# Patient Record
Sex: Female | Born: 1955 | Race: White | Hispanic: Yes | Marital: Single | Smoking: Former smoker
Health system: Southern US, Community
[De-identification: ages and names within clinical notes are randomized; demographics above are authoritative.]

## PROBLEM LIST (undated history)

## (undated) DIAGNOSIS — E1165 Type 2 diabetes mellitus with hyperglycemia: Secondary | ICD-10-CM

## (undated) DIAGNOSIS — Z8249 Family history of ischemic heart disease and other diseases of the circulatory system: Secondary | ICD-10-CM

## (undated) DIAGNOSIS — I82409 Acute embolism and thrombosis of unspecified deep veins of unspecified lower extremity: Secondary | ICD-10-CM

## (undated) DIAGNOSIS — M4800 Spinal stenosis, site unspecified: Secondary | ICD-10-CM

## (undated) DIAGNOSIS — R011 Cardiac murmur, unspecified: Secondary | ICD-10-CM

## (undated) DIAGNOSIS — M109 Gout, unspecified: Secondary | ICD-10-CM

## (undated) DIAGNOSIS — IMO0001 Reserved for inherently not codable concepts without codable children: Secondary | ICD-10-CM

## (undated) DIAGNOSIS — E119 Type 2 diabetes mellitus without complications: Secondary | ICD-10-CM

## (undated) DIAGNOSIS — E785 Hyperlipidemia, unspecified: Secondary | ICD-10-CM

## (undated) DIAGNOSIS — I10 Essential (primary) hypertension: Secondary | ICD-10-CM

## (undated) DIAGNOSIS — I80299 Phlebitis and thrombophlebitis of other deep vessels of unspecified lower extremity: Secondary | ICD-10-CM

## (undated) DIAGNOSIS — Z720 Tobacco use: Secondary | ICD-10-CM

## (undated) DIAGNOSIS — G4730 Sleep apnea, unspecified: Secondary | ICD-10-CM

## (undated) DIAGNOSIS — N390 Urinary tract infection, site not specified: Secondary | ICD-10-CM

## (undated) DIAGNOSIS — I2699 Other pulmonary embolism without acute cor pulmonale: Secondary | ICD-10-CM

## (undated) DIAGNOSIS — N200 Calculus of kidney: Secondary | ICD-10-CM

## (undated) HISTORY — PX: ABDOMINAL HYSTERECTOMY: SHX81

## (undated) HISTORY — DX: Calculus of kidney: N20.0

## (undated) HISTORY — DX: Reserved for inherently not codable concepts without codable children: IMO0001

## (undated) HISTORY — PX: APPENDECTOMY: SHX54

## (undated) HISTORY — DX: Phlebitis and thrombophlebitis of other deep vessels of unspecified lower extremity: I80.299

## (undated) HISTORY — DX: Gout, unspecified: M10.9

## (undated) HISTORY — DX: Morbid (severe) obesity due to excess calories: E66.01

## (undated) HISTORY — PX: INTESTINAL BYPASS: SHX1099

## (undated) HISTORY — DX: Type 2 diabetes mellitus without complications: E11.9

## (undated) HISTORY — DX: Type 2 diabetes mellitus with hyperglycemia: E11.65

## (undated) HISTORY — PX: STOMACH SURGERY: SHX791

## (undated) HISTORY — DX: Hyperlipidemia, unspecified: E78.5

## (undated) HISTORY — DX: Sleep apnea, unspecified: G47.30

## (undated) HISTORY — PX: COLECTOMY: SHX59

## (undated) HISTORY — DX: Spinal stenosis, site unspecified: M48.00

## (undated) HISTORY — DX: Urinary tract infection, site not specified: N39.0

## (undated) HISTORY — PX: CHOLECYSTECTOMY: SHX55

## (undated) HISTORY — DX: Essential (primary) hypertension: I10

## (undated) HISTORY — PX: ANKLE SURGERY: SHX546

## (undated) HISTORY — DX: Cardiac murmur, unspecified: R01.1

---

## 2009-07-25 ENCOUNTER — Ambulatory Visit: Payer: Self-pay | Admitting: Vascular Surgery

## 2009-07-25 ENCOUNTER — Encounter (INDEPENDENT_AMBULATORY_CARE_PROVIDER_SITE_OTHER): Payer: Self-pay | Admitting: Emergency Medicine

## 2009-07-25 ENCOUNTER — Observation Stay (HOSPITAL_COMMUNITY): Admission: EM | Admit: 2009-07-25 | Discharge: 2009-07-25 | Payer: Self-pay | Admitting: Emergency Medicine

## 2011-03-06 LAB — URINE MICROSCOPIC-ADD ON

## 2011-03-06 LAB — CBC
MCV: 80.7 fL (ref 78.0–100.0)
RBC: 4.58 MIL/uL (ref 3.87–5.11)
WBC: 5.3 10*3/uL (ref 4.0–10.5)

## 2011-03-06 LAB — URINALYSIS, ROUTINE W REFLEX MICROSCOPIC
Bilirubin Urine: NEGATIVE
Hgb urine dipstick: NEGATIVE
Specific Gravity, Urine: 1.017 (ref 1.005–1.030)
Urobilinogen, UA: 0.2 mg/dL (ref 0.0–1.0)

## 2011-03-06 LAB — PROTIME-INR
INR: 1.1 (ref 0.00–1.49)
Prothrombin Time: 13.6 seconds (ref 11.6–15.2)

## 2011-03-06 LAB — BASIC METABOLIC PANEL
Calcium: 8.8 mg/dL (ref 8.4–10.5)
Chloride: 104 mEq/L (ref 96–112)
Creatinine, Ser: 0.92 mg/dL (ref 0.4–1.2)
GFR calc Af Amer: >60 mL/min (ref >60)
GFR calc non Af Amer: >60 mL/min (ref >60)

## 2011-03-06 LAB — DIFFERENTIAL
Lymphocytes Relative: 24 % (ref 12–46)
Lymphs Abs: 1.3 10*3/uL (ref 0.7–4.0)
Monocytes Relative: 7 % (ref 3–12)
Neutro Abs: 3.4 10*3/uL (ref 1.7–7.7)
Neutrophils Relative %: 65 % (ref 43–77)

## 2011-03-06 LAB — GLUCOSE, CAPILLARY: Glucose-Capillary: 126 mg/dL — ABNORMAL HIGH (ref 70–99)

## 2012-09-15 LAB — HM MAMMOGRAPHY: HM Mammogram: NORMAL

## 2012-12-25 ENCOUNTER — Encounter: Payer: Self-pay | Admitting: Cardiovascular Disease

## 2012-12-25 ENCOUNTER — Ambulatory Visit (INDEPENDENT_AMBULATORY_CARE_PROVIDER_SITE_OTHER): Payer: PRIVATE HEALTH INSURANCE | Admitting: Cardiovascular Disease

## 2012-12-25 VITALS — BP 188/78 | HR 90 | Ht 61.5 in | Wt 315.8 lb

## 2012-12-25 DIAGNOSIS — E119 Type 2 diabetes mellitus without complications: Secondary | ICD-10-CM

## 2012-12-25 DIAGNOSIS — R0602 Shortness of breath: Secondary | ICD-10-CM | POA: Insufficient documentation

## 2012-12-25 DIAGNOSIS — R011 Cardiac murmur, unspecified: Secondary | ICD-10-CM | POA: Insufficient documentation

## 2012-12-25 DIAGNOSIS — I10 Essential (primary) hypertension: Secondary | ICD-10-CM

## 2012-12-25 DIAGNOSIS — Z0000 Encounter for general adult medical examination without abnormal findings: Secondary | ICD-10-CM

## 2012-12-25 NOTE — Assessment & Plan Note (Signed)
Given strong family history, diabetes, prior smoking history, we'll need to be aggressive with her lipids. Labs requested from [place]

## 2012-12-25 NOTE — Assessment & Plan Note (Signed)
Sugars are running high. We have encouraged continued exercise, careful diet management in an effort to lose weight.

## 2012-12-25 NOTE — Assessment & Plan Note (Signed)
Low-grade murmur. 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 Echocardiogram has been ordered given her shortness of breath.

## 2012-12-25 NOTE — Progress Notes (Signed)
Patient ID: Sonya Sanchez, female    DOB: 01-Dec-1955, 57 y.o.   MRN: [id number]  HPI Comments: A pleasant 57 year old woman who lives in [place]  in the winter and [place] the rest of the year, history of obesity, DVT/PE, diabetes, prior smoking history,  hypertension, strong family history of heart disease with parents dying in their 57s from heart attack by her report who presents to establish care. She was told in [place] by her primary care physician that she had a murmur and needed an echocardiogram.  She reports that she has shortness of breath with exertion. She is concerned about her primary care physician detailing a heart murmur. No recent chest pain, no lightheadedness or dizziness. Sugars have been running 140, sometimes up to 200. Difficult time losing weight. She does not check her blood pressure at home but reports that it is typically "okay". She does not do any regular exercise. She is excited as she has a wedding in the family next month. She has been unable to put on TED hose for chronic lower extremity edema.  She reports history of pulmonary embolism in 2006. At that time she had shortness of breath and it felt like "Bronchitis". Workup in [place] confirmed PE. She denies any long car trips or plane rides that could have caused a DVT. Primary care physician in [place] is monitoring her INR.   EKG shows normal sinus rhythm with rate 90 beats per minute, no significant ST or T wave changes  Ultrasound August 2010 for possible cellulitis showed no DVT   Outpatient Encounter Prescriptions as of 12/25/2012  Medication Sig Dispense Refill  . allopurinol (ZYLOPRIM) 300 MG tablet Take 300 mg by mouth daily.      . Cholecalciferol (VITAMIN D) 1000 UNITS capsule Take 2,000 Units by mouth daily.      Marland Kitchen esomeprazole (NEXIUM) 40 MG capsule Take 40 mg by mouth daily before breakfast.      . fenofibrate 54 MG tablet Take 54 mg by mouth daily.      . insulin aspart (NOVOLOG FLEXPEN)  100 UNIT/ML injection Inject 5 Units into the skin 3 (three) times daily before meals.      . insulin detemir (LEVEMIR) 100 UNIT/ML injection Inject 60 Units into the skin at bedtime.      . Liraglutide (VICTOZA) 18 MG/3ML SOLN Inject 1.8 mg into the skin daily.      . Multiple Vitamin (MULTIVITAMIN) tablet Take 1 tablet by mouth daily.      . sitaGLIPtan-metformin (JANUMET) 50-1000 MG per tablet Take 1 tablet by mouth daily.      . valsartan-hydrochlorothiazide (DIOVAN-HCT) 160-12.5 MG per tablet Take 1 tablet by mouth daily.      Marland Kitchen warfarin (COUMADIN) 10 MG tablet Take 10 mg by mouth daily.      Marland Kitchen warfarin (COUMADIN) 7.5 MG tablet Take 7.5 mg by mouth as directed.         Review of Systems  Constitutional: Negative.   HENT: Negative.   Eyes: Negative.   Respiratory: Positive for shortness of breath.   Cardiovascular: Negative.   Gastrointestinal: Negative.   Musculoskeletal: Negative.   Skin: Negative.   Neurological: Negative.   Hematological: Negative.   Psychiatric/Behavioral: Negative.   All other systems reviewed and are negative.    BP 188/78  Pulse 90  Ht 5' 1.5" (1.562 m)  Wt 315 lb 12 oz (143.223 kg)  BMI 58.69 kg/m2 Repeat blood pressure 158/80 on the right  arm  Physical Exam  Nursing note and vitals reviewed. Constitutional: She is oriented to person, place, and time. She appears well-developed and well-nourished.       Obese  HENT:  Head: Normocephalic.  Nose: Nose normal.  Mouth/Throat: Oropharynx is clear and moist.  Eyes: Conjunctivae normal are normal. Pupils are equal, round, and reactive to light.  Neck: Normal range of motion. Neck supple. No JVD present.  Cardiovascular: Normal rate, regular rhythm, S1 normal, S2 normal and intact distal pulses.  Exam reveals no gallop and no friction rub.   Murmur heard.  Crescendo systolic murmur is present with a grade of 1/6  Pulmonary/Chest: Effort normal and breath sounds normal. No respiratory distress. She  has no wheezes. She has no rales. She exhibits no tenderness.  Abdominal: Soft. Bowel sounds are normal. She exhibits no distension. There is no tenderness.  Musculoskeletal: Normal range of motion. She exhibits no edema and no tenderness.  Lymphadenopathy:    She has no cervical adenopathy.  Neurological: She is alert and oriented to person, place, and time. Coordination normal.  Skin: Skin is warm and dry. No rash noted. No erythema.       Skin changes of the lower extremities from chronic swelling/venous insufficiency.  Psychiatric: She has a normal mood and affect. Her behavior is normal. Judgment and thought content normal.         Assessment and Plan

## 2012-12-25 NOTE — Patient Instructions (Addendum)
You are doing well. No medication changes were made.  We will order an echo for murmur We have requested labs from your primary care doctor in [place] Please monitor your blood pressure at home, call the office for numbers greater than 140  Please call us if you have new issues that need to be addressed before your next appt.  Your physician wants you to follow-up in: 6 months.  You will receive a reminder letter in the mail two months in advance. If you don't receive a letter, please call our office to schedule the follow-up appointment.

## 2012-12-25 NOTE — Assessment & Plan Note (Signed)
Blood pressure is high today. We have recommended that she buy a blood pressure cuff, wrist cuff as her arms are large. Goal systolic pressure less than 135. She will call us with blood pressure numbers.

## 2012-12-25 NOTE — Assessment & Plan Note (Signed)
Likely secondary to obesity and deconditioning. Murmur on exam. Echocardiogram pending.

## 2013-01-17 ENCOUNTER — Ambulatory Visit: Payer: Self-pay | Admitting: Family Medicine

## 2013-01-23 ENCOUNTER — Other Ambulatory Visit (INDEPENDENT_AMBULATORY_CARE_PROVIDER_SITE_OTHER): Payer: PRIVATE HEALTH INSURANCE

## 2013-01-23 ENCOUNTER — Other Ambulatory Visit: Payer: Self-pay

## 2013-01-23 DIAGNOSIS — R011 Cardiac murmur, unspecified: Secondary | ICD-10-CM

## 2013-01-23 DIAGNOSIS — R0602 Shortness of breath: Secondary | ICD-10-CM

## 2013-01-31 ENCOUNTER — Telehealth: Payer: Self-pay | Admitting: *Deleted

## 2013-01-31 NOTE — Telephone Encounter (Signed)
Pt calling looking for echo results

## 2013-01-31 NOTE — Telephone Encounter (Signed)
Please review echo thanks

## 2013-02-05 ENCOUNTER — Other Ambulatory Visit: Payer: Self-pay

## 2013-02-05 ENCOUNTER — Telehealth: Payer: Self-pay

## 2013-02-05 DIAGNOSIS — I2699 Other pulmonary embolism without acute cor pulmonale: Secondary | ICD-10-CM

## 2013-02-05 DIAGNOSIS — I82409 Acute embolism and thrombosis of unspecified deep veins of unspecified lower extremity: Secondary | ICD-10-CM

## 2013-02-05 NOTE — Telephone Encounter (Signed)
Order faxed to [facility]

## 2013-02-05 NOTE — Telephone Encounter (Signed)
Pt would like Dr Mariah Milling to send lab orders for coumadin to [facility] on [address].

## 2013-02-06 NOTE — Telephone Encounter (Signed)
Essentially normal echo Normal LV function, ejection fraction greater than 55% Very mild aortic valve stenosis This does not need to be followed on a regular basis This would explain her murmur, not significant at this time

## 2013-02-07 NOTE — Telephone Encounter (Signed)
Pt informed Understanding verb 

## 2013-02-13 ENCOUNTER — Encounter: Payer: Self-pay | Admitting: Internal Medicine

## 2013-02-13 ENCOUNTER — Ambulatory Visit (INDEPENDENT_AMBULATORY_CARE_PROVIDER_SITE_OTHER): Payer: PRIVATE HEALTH INSURANCE | Admitting: Internal Medicine

## 2013-02-13 ENCOUNTER — Telehealth: Payer: Self-pay | Admitting: *Deleted

## 2013-02-13 ENCOUNTER — Ambulatory Visit: Payer: PRIVATE HEALTH INSURANCE | Admitting: Internal Medicine

## 2013-02-13 VITALS — BP 132/70 | HR 92 | Temp 98.9°F | Ht 59.75 in | Wt 321.0 lb

## 2013-02-13 DIAGNOSIS — E119 Type 2 diabetes mellitus without complications: Secondary | ICD-10-CM

## 2013-02-13 DIAGNOSIS — I10 Essential (primary) hypertension: Secondary | ICD-10-CM

## 2013-02-13 DIAGNOSIS — E785 Hyperlipidemia, unspecified: Secondary | ICD-10-CM

## 2013-02-13 LAB — COMPREHENSIVE METABOLIC PANEL
Alkaline Phosphatase: 43 U/L (ref 39–117)
BUN: 24 mg/dL — ABNORMAL HIGH (ref 6–23)
CO2: 26 mEq/L (ref 19–32)
GFR: 60.36 mL/min (ref >60.00)
Glucose, Bld: 163 mg/dL — ABNORMAL HIGH (ref 70–99)
Total Bilirubin: 0.5 mg/dL (ref 0.3–1.2)

## 2013-02-13 LAB — LIPID PANEL
Cholesterol: 223 mg/dL — ABNORMAL HIGH (ref 0–200)
HDL: 28.6 mg/dL — ABNORMAL LOW (ref >39.00)
VLDL: 35.6 mg/dL (ref 0.0–40.0)

## 2013-02-13 LAB — HEMOGLOBIN A1C: Hgb A1c MFr Bld: 9.8 % — ABNORMAL HIGH (ref 4.6–6.5)

## 2013-02-13 MED ORDER — EZETIMIBE-SIMVASTATIN 10-20 MG PO TABS: 1.0000 | ORAL_TABLET | Freq: Every day | ORAL | Status: DC

## 2013-02-13 MED ORDER — INSULIN ASPART 100 UNIT/ML ~~LOC~~ SOLN: 5.0000 [IU] | Freq: Three times a day (TID) | SUBCUTANEOUS | Status: DC

## 2013-02-13 MED ORDER — INSULIN DETEMIR 100 UNIT/ML ~~LOC~~ SOLN: SUBCUTANEOUS | Status: DC

## 2013-02-13 NOTE — Assessment & Plan Note (Signed)
BP Readings from Last 3 Encounters:  02/13/13 132/70  12/25/12 188/78   BP improved today compared to check with cardiology. Will continue current medications. Renal function normal with labs today.

## 2013-02-13 NOTE — Assessment & Plan Note (Signed)
Body mass index is 63.19 kg/(m^2).  Discussed keeping a food diary, setting goal of calories <1200 per day, and increasing physical activity.

## 2013-02-13 NOTE — Assessment & Plan Note (Signed)
LDL above goal today. Consider increasing Vytorin to 10-40 at next visit.

## 2013-02-13 NOTE — Telephone Encounter (Signed)
Called [phone] for the Novolog, form is being faxed over now

## 2013-02-13 NOTE — Assessment & Plan Note (Signed)
Lab Results  Component Value Date   HGBA1C 9.8* 02/13/2013   A1c elevated today. Recommended increase in Levemir to 70 units in the morning and 30units at night. Will continue Novolog at current dose. Pt will follow up for repeat A1c in 04/2013 with her physician in [place].

## 2013-02-13 NOTE — Patient Instructions (Signed)
[facility] [place] Times Jan 2012

## 2013-02-13 NOTE — Progress Notes (Signed)
Subjective:    Patient ID: Sonya Sanchez, female    DOB: 07-30-1956, 57 y.o.   MRN: [id number]  HPI 57YO female with h/o DVT/PE on chronic anticoagulation, morbid obesity, DM, HTN presents to establish care.  DM - compliant with meds. Did not bring record of BG, but reports BG well controlled. Tries to limit intake of sweets. Does not follow specific diet.  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 Unsure if any hypercoag workup completed in past. Recent INR low 1.6, repeat level pending. No recent bleeding or bruising.  Obesity - Lifelong. Has tried numerous diets. Success at losing weight but not maintaining weight loss. Exercise limited by chronic pain in ankles and knees.  Outpatient Encounter Prescriptions as of 02/13/2013  Medication Sig Dispense Refill  . allopurinol (ZYLOPRIM) 300 MG tablet Take 300 mg by mouth daily.      . Cholecalciferol (VITAMIN D) 1000 UNITS capsule Take 2,000 Units by mouth daily.      Marland Kitchen esomeprazole (NEXIUM) 40 MG capsule Take 40 mg by mouth daily before breakfast.      . fenofibrate 54 MG tablet Take 54 mg by mouth daily.      . insulin aspart (NOVOLOG FLEXPEN) 100 UNIT/ML injection Inject 5 Units into the skin 3 (three) times daily before meals.  1 vial  6  . insulin detemir (LEVEMIR) 100 UNIT/ML injection 60units qam and 25units at bedtime  10 mL  6  . Liraglutide (VICTOZA) 18 MG/3ML SOLN Inject 1.8 mg into the skin daily.      . Multiple Vitamin (MULTIVITAMIN) tablet Take 1 tablet by mouth daily.      . sitaGLIPtan-metformin (JANUMET) 50-1000 MG per tablet Take 1 tablet by mouth daily.      . valsartan-hydrochlorothiazide (DIOVAN-HCT) 160-12.5 MG per tablet Take 1 tablet by mouth daily.      Marland Kitchen warfarin (COUMADIN) 10 MG tablet Take 10 mg by mouth daily.      Marland Kitchen warfarin (COUMADIN) 7.5 MG tablet Take 7.5 mg by mouth as directed.      . [DISCONTINUED] insulin aspart (NOVOLOG FLEXPEN) 100 UNIT/ML injection Inject 5 Units into the skin 3  (three) times daily before meals.      . [DISCONTINUED] insulin detemir (LEVEMIR) 100 UNIT/ML injection Inject 60 Units into the skin at bedtime.      Marland Kitchen ezetimibe-simvastatin (VYTORIN) 10-20 MG per tablet Take 1 tablet by mouth at bedtime.  30 tablet  6   No facility-administered encounter medications on file as of 02/13/2013.   BP 132/70  Pulse 92  Temp(Src) 98.9 F (37.2 C) (Oral)  Ht 4' 11.75" (1.518 m)  Wt 321 lb (145.605 kg)  BMI 63.19 kg/m2  SpO2 95%  Review of Systems  Constitutional: Negative for fever, chills, appetite change, fatigue and unexpected weight change.  HENT: Negative for ear pain, congestion, sore throat, trouble swallowing, neck pain, voice change and sinus pressure.   Eyes: Negative for visual disturbance.  Respiratory: Positive for shortness of breath (with exertion). Negative for cough, wheezing and stridor.   Cardiovascular: Negative for chest pain, palpitations and leg swelling.  Gastrointestinal: Negative for nausea, vomiting, abdominal pain, diarrhea, constipation, blood in stool, abdominal distention and anal bleeding.  Genitourinary: Negative for dysuria and flank pain.  Musculoskeletal: Positive for arthralgias. Negative for myalgias and gait problem.  Skin: Negative for color change and rash.  Neurological: Negative for dizziness and headaches.  Hematological: Negative for adenopathy. Does not bruise/bleed easily.  Psychiatric/Behavioral: Negative for suicidal  ideas, sleep disturbance and dysphoric mood. The patient is not nervous/anxious.        Objective:   Physical Exam  Constitutional: She is oriented to person, place, and time. She appears well-developed and well-nourished. No distress.  HENT:  Head: Normocephalic and atraumatic.  Right Ear: External ear normal.  Left Ear: External ear normal.  Nose: Nose normal.  Mouth/Throat: Oropharynx is clear and moist. No oropharyngeal exudate.  Eyes: Conjunctivae are normal. Pupils are equal, round,  and reactive to light. Right eye exhibits no discharge. Left eye exhibits no discharge. No scleral icterus.  Neck: Normal range of motion. Neck supple. No tracheal deviation present. No thyromegaly present.  Cardiovascular: Normal rate, regular rhythm and intact distal pulses.  Exam reveals no gallop and no friction rub.   Murmur heard. Pulmonary/Chest: Effort normal and breath sounds normal. No respiratory distress. She has no wheezes. She has no rales. She exhibits no tenderness.  Musculoskeletal: Normal range of motion. She exhibits no edema and no tenderness.  Lymphadenopathy:    She has no cervical adenopathy.  Neurological: She is alert and oriented to person, place, and time. No cranial nerve deficit. She exhibits normal muscle tone. Coordination normal.  Skin: Skin is warm and dry. No rash noted. She is not diaphoretic. No erythema. No pallor.  Psychiatric: She has a normal mood and affect. Her behavior is normal. Judgment and thought content normal.          Assessment & Plan:

## 2013-02-14 ENCOUNTER — Telehealth: Payer: Self-pay

## 2013-02-14 NOTE — Telephone Encounter (Signed)
Pt confirms she had labs drawn at [facility] 3/14 I called [facility] and they confirmed pt had INR checked 3/14  I then called [facility] results line and wa stold again they have no record of this pt by name, DOB, account # or SS#.  I will call pt back to discuss

## 2013-02-14 NOTE — Telephone Encounter (Signed)
LMTCB re: INR that was supposed to be checked at [facility] I called [facility] who has no record of pt

## 2013-02-16 NOTE — Telephone Encounter (Signed)
I was able to speak with rep at [facility] again They say they have pt's results and are faxing now

## 2013-02-16 NOTE — Telephone Encounter (Signed)
I called pt to tell her INR=2.4 on 3/14 She lives in [place] but is here until the end of April [place] MD continues to manage  She will stay on same dose and check again in 1 month here in our office I will also fax lab results to Dr. Lendell Caprice in [place]

## 2013-02-22 ENCOUNTER — Telehealth: Payer: Self-pay | Admitting: *Deleted

## 2013-02-22 NOTE — Telephone Encounter (Signed)
Patient would like for you to call her. She is having problems with having her script for Novolog filled

## 2013-02-22 NOTE — Telephone Encounter (Signed)
Explained to patient that we have not received anything in reference to this, if she could call her pharmacy and have them fax [place] the information.

## 2013-02-22 NOTE — Telephone Encounter (Signed)
Was calling to see if someone was able to get authorization for her Novolog.

## 2013-02-28 MED ORDER — INSULIN LISPRO 100 UNIT/ML ~~LOC~~ SOLN: 5.0000 [IU] | Freq: Three times a day (TID) | SUBCUTANEOUS | Status: DC

## 2013-02-28 NOTE — Telephone Encounter (Signed)
Patient returned call and stated she just got in touch with her insurance company today. They do not cover Novolog, they would like her to switch to Humalog. Please fax prescription to [facility] in [place].

## 2013-03-02 ENCOUNTER — Telehealth: Payer: Self-pay | Admitting: *Deleted

## 2013-03-02 MED ORDER — INSULIN LISPRO 100 UNIT/ML ~~LOC~~ SOLN: 5.0000 [IU] | Freq: Three times a day (TID) | SUBCUTANEOUS | Status: DC

## 2013-03-02 NOTE — Telephone Encounter (Signed)
Fine to change to Humalog quick pen, same directions

## 2013-03-02 NOTE — Telephone Encounter (Signed)
Called because the vial of Humalog was sent to the pharmacy and she needs the [facility] pen. Need this to be sent to [facility] on [address].

## 2013-03-02 NOTE — Telephone Encounter (Signed)
Prescription updated and sent to pharmacy.

## 2013-03-19 ENCOUNTER — Telehealth: Payer: Self-pay

## 2013-03-19 NOTE — Telephone Encounter (Signed)
Has appt with coumadin clinic 4/23

## 2013-03-19 NOTE — Telephone Encounter (Signed)
INR

## 2013-03-20 ENCOUNTER — Telehealth: Payer: Self-pay | Admitting: Internal Medicine

## 2013-03-20 NOTE — Telephone Encounter (Signed)
Patient was told that she needs to come to be seen by Dr. Dan Humphreys before getting a prescription for the zpak.Patient stated she would go to her cardiologist tomorrow.

## 2013-03-20 NOTE — Telephone Encounter (Signed)
Patient Information:  Caller Name: [place]  Phone: [phone]  Patient: Sonya Sanchez  Gender: Female  DOB: 1956/04/28  Age: 57 Years  PCP: Ronna Polio (Adults only)  Office Follow Up:  Does the office need to follow up with this patient?: Yes  Instructions For The Office: Wondering if Sanchez Zpak could be called in today since she already has an appt 4/23 with her cardiologist   Symptoms  Reason For Call & Symptoms: c/o feeling hot and cold, but denies fever; congested in nose and chest; coughing up some yellowish phlegm; c/o HA from coughing; ears congested  Reviewed Health History In EMR: Yes  Reviewed Medications In EMR: Yes  Reviewed Allergies In EMR: Yes  Reviewed Surgeries / Procedures: Yes  Date of Onset of Symptoms: 03/19/2013  Treatments Tried: Tylenol Cold & Allergy  Treatments Tried Worked: No  Guideline(s) Used:  Colds  Cough  Disposition Per Guideline:   See Today in Office  Reason For Disposition Reached:   Sinus pain persists after using nasal washes and pain medicine > 24 hours  Advice Given:  N/A  RN Overrode Recommendation:  Patient Already Has Appt, Document Patient  has appt with cardiologist 03/21/13

## 2013-03-20 NOTE — Telephone Encounter (Deleted)
Please Advise

## 2013-03-21 ENCOUNTER — Ambulatory Visit (INDEPENDENT_AMBULATORY_CARE_PROVIDER_SITE_OTHER): Payer: PRIVATE HEALTH INSURANCE

## 2013-03-21 DIAGNOSIS — Z7189 Other specified counseling: Secondary | ICD-10-CM | POA: Insufficient documentation

## 2013-03-21 DIAGNOSIS — Z7901 Long term (current) use of anticoagulants: Secondary | ICD-10-CM

## 2013-03-21 DIAGNOSIS — I2699 Other pulmonary embolism without acute cor pulmonale: Secondary | ICD-10-CM

## 2013-03-21 DIAGNOSIS — I82409 Acute embolism and thrombosis of unspecified deep veins of unspecified lower extremity: Secondary | ICD-10-CM | POA: Insufficient documentation

## 2013-03-21 LAB — POCT INR: INR: 1.1

## 2013-05-03 LAB — HM DIABETES EYE EXAM

## 2013-07-24 LAB — HM DIABETES FOOT EXAM

## 2013-08-03 ENCOUNTER — Ambulatory Visit (INDEPENDENT_AMBULATORY_CARE_PROVIDER_SITE_OTHER): Payer: PRIVATE HEALTH INSURANCE | Admitting: Internal Medicine

## 2013-08-03 ENCOUNTER — Ambulatory Visit (INDEPENDENT_AMBULATORY_CARE_PROVIDER_SITE_OTHER): Payer: PRIVATE HEALTH INSURANCE | Admitting: Cardiovascular Disease

## 2013-08-03 ENCOUNTER — Encounter: Payer: Self-pay | Admitting: Cardiovascular Disease

## 2013-08-03 ENCOUNTER — Encounter: Payer: Self-pay | Admitting: Internal Medicine

## 2013-08-03 VITALS — BP 138/78 | HR 86 | Temp 98.9°F | Wt 323.0 lb

## 2013-08-03 VITALS — BP 130/82 | HR 85 | Ht 61.5 in | Wt 323.5 lb

## 2013-08-03 DIAGNOSIS — E119 Type 2 diabetes mellitus without complications: Secondary | ICD-10-CM

## 2013-08-03 DIAGNOSIS — I2699 Other pulmonary embolism without acute cor pulmonale: Secondary | ICD-10-CM

## 2013-08-03 DIAGNOSIS — I872 Venous insufficiency (chronic) (peripheral): Secondary | ICD-10-CM

## 2013-08-03 DIAGNOSIS — I10 Essential (primary) hypertension: Secondary | ICD-10-CM

## 2013-08-03 DIAGNOSIS — R011 Cardiac murmur, unspecified: Secondary | ICD-10-CM

## 2013-08-03 LAB — HEMOGLOBIN A1C: Hgb A1c MFr Bld: 9.8 % — ABNORMAL HIGH (ref 4.6–6.5)

## 2013-08-03 LAB — COMPREHENSIVE METABOLIC PANEL
ALT: 14 U/L (ref 0–35)
AST: 18 U/L (ref 0–37)
Albumin: 3.6 g/dL (ref 3.5–5.2)
CO2: 26 mEq/L (ref 19–32)
Calcium: 9.3 mg/dL (ref 8.4–10.5)
Chloride: 101 mEq/L (ref 96–112)
Creatinine, Ser: 0.9 mg/dL (ref 0.4–1.2)
GFR: 71.72 mL/min (ref >60.00)
Potassium: 4.1 mEq/L (ref 3.5–5.1)

## 2013-08-03 LAB — LIPID PANEL
HDL: 30.3 mg/dL — ABNORMAL LOW (ref >39.00)
Total CHOL/HDL Ratio: 6

## 2013-08-03 NOTE — Progress Notes (Signed)
Subjective:    Patient ID: Sonya Sanchez, female    DOB: 1956/03/20, 57 y.o.   MRN: [id number]  HPI 57YO female with h/o diabetes, hypertension, hyperlipidemia, pulmonary embolus on chronic anticoagulation presents for follow up. Recently traveled back from [place]. Feeling well. Notes some dietary indiscretion during her vacation. Had lost 6lbs, then regained it.  DM - BG typically near 110s-130s. No consistent BG >200. No BG<70. Compliant with medications. Recent foot and eye exam normal per her report  PE on Chronic anticoagulation - Recently changed from Warfarin to Eliquis. No issues with bleeding or bruising noted. Tolerating well.  She also reports that she was seen recently by her podiatrist in [place] who referred her to a vascular surgeon in [place]. He is planning to perform laser ablation on veins in BLE to help improved chronic edema and varicosities. She periodically uses compression stockings, however not generally in the warmer weather.  Outpatient Encounter Prescriptions as of 08/03/2013  Medication Sig Dispense Refill  . allopurinol (ZYLOPRIM) 300 MG tablet Take 300 mg by mouth daily.      Marland Kitchen apixaban (ELIQUIS) 5 MG TABS tablet Take 5 mg by mouth 2 (two) times daily.      . Cholecalciferol (VITAMIN D) 1000 UNITS capsule Take 2,000 Units by mouth daily.      Marland Kitchen esomeprazole (NEXIUM) 40 MG capsule Take 40 mg by mouth daily before breakfast.      . fenofibrate 54 MG tablet Take 54 mg by mouth daily.      . insulin detemir (LEVEMIR) 100 UNIT/ML injection 60units qam and 25units at bedtime  10 mL  6  . insulin lispro (HUMALOG KWIKPEN) 100 UNIT/ML injection Inject 5 Units into the skin 3 (three) times daily before meals.  10 mL  6  . Liraglutide (VICTOZA) 18 MG/3ML SOLN Inject 1.8 mg into the skin daily.      . Multiple Vitamin (MULTIVITAMIN) tablet Take 1 tablet by mouth daily.      . rosuvastatin (CRESTOR) 10 MG tablet Take 10 mg by mouth daily.      . sitaGLIPtan-metformin (JANUMET) 50-1000 MG per  tablet Take 1 tablet by mouth daily.      . valsartan-hydrochlorothiazide (DIOVAN-HCT) 160-12.5 MG per tablet Take 1 tablet by mouth daily.       No facility-administered encounter medications on file as of 08/03/2013.   BP 138/78  Pulse 86  Temp(Src) 98.9 F (37.2 C) (Oral)  Wt 323 lb (146.512 kg)  BMI 60.05 kg/m2  SpO2 95%  Review of Systems  Constitutional: Negative for fever, chills, appetite change, fatigue and unexpected weight change.  HENT: Negative for ear pain, congestion, sore throat, trouble swallowing, neck pain, voice change and sinus pressure.   Eyes: Negative for visual disturbance.  Respiratory: Negative for cough, shortness of breath, wheezing and stridor.   Cardiovascular: Negative for chest pain, palpitations and leg swelling.  Gastrointestinal: Negative for nausea, vomiting, abdominal pain, diarrhea, constipation, blood in stool, abdominal distention and anal bleeding.  Genitourinary: Negative for dysuria and flank pain.  Musculoskeletal: Negative for myalgias, arthralgias and gait problem.  Skin: Negative for color change and rash.  Neurological: Negative for dizziness and headaches.  Hematological: Negative for adenopathy. Does not bruise/bleed easily.  Psychiatric/Behavioral: Negative for suicidal ideas, sleep disturbance and dysphoric mood. The patient is not nervous/anxious.        Objective:   Physical Exam  Constitutional: She is oriented to person, place, and time. She appears well-developed and well-nourished. No distress.  HENT:  Head: Normocephalic and atraumatic.  Right Ear: External ear normal.  Left Ear: External ear normal.  Nose: Nose normal.  Mouth/Throat: Oropharynx is clear and moist. No oropharyngeal exudate.  Eyes: Conjunctivae are normal. Pupils are equal, round, and reactive to light. Right eye exhibits no discharge. Left eye exhibits no discharge. No scleral icterus.  Neck: Normal range of motion. Neck supple. No tracheal deviation  present. No thyromegaly present.  Cardiovascular: Normal rate, regular rhythm and intact distal pulses.  Exam reveals no gallop and no friction rub.   Murmur heard. Pulmonary/Chest: Effort normal and breath sounds normal. No accessory muscle usage. Not tachypneic. No respiratory distress. She has no decreased breath sounds. She has no wheezes. She has no rhonchi. She has no rales. She exhibits no tenderness.  Musculoskeletal: Normal range of motion. She exhibits edema (Bilateral LE with brown/red discoloration c/w chronic venous insufficiency). She exhibits no tenderness.  Lymphadenopathy:    She has no cervical adenopathy.  Neurological: She is alert and oriented to person, place, and time. No cranial nerve deficit. She exhibits normal muscle tone. Coordination normal.  Skin: Skin is warm and dry. No rash noted. She is not diaphoretic. No erythema. No pallor.  Psychiatric: She has a normal mood and affect. Her behavior is normal. Judgment and thought content normal.          Assessment & Plan:

## 2013-08-03 NOTE — Assessment & Plan Note (Signed)
No signs of bleeding on her anticoagulation. Co-pay card provided to her today

## 2013-08-03 NOTE — Assessment & Plan Note (Signed)
BP Readings from Last 3 Encounters:  08/03/13 138/78  08/03/13 130/82  02/13/13 132/70   BP well controlled on current medication. Will check renal function and urine microalbumin with labs today.

## 2013-08-03 NOTE — Assessment & Plan Note (Signed)
We have encouraged continued exercise, careful diet management in an effort to lose weight. 

## 2013-08-03 NOTE — Assessment & Plan Note (Addendum)
Followed by vascular surgeon in [place]. Plan for laser ablation later this fall. Will request notes on evaluation and plan. Encouraged use of compression stockings.

## 2013-08-03 NOTE — Assessment & Plan Note (Signed)
Pt reports relatively good control of BG. She notes last A1c was performed in [place] in July. Plan to recheck A1c today. Continue current medications. Follow up in 6 months when she returns from [place]>

## 2013-08-03 NOTE — Progress Notes (Signed)
Patient ID: Sonya Sanchez, female    DOB: August 19, 1956, 57 y.o.   MRN: [id number]  HPI Comments:  57 year old woman who lives in [place]  in the winter and [place] the rest of the year, history of morbid obesity, DVT/PE on anticoagulation, diabetes, prior smoking history,  hypertension, strong family history of heart disease with parents dying in their 60s from heart attack  who presents for routine followup.    Previously presented to our office last year for evaluation of murmur. Echocardiogram showed mild aortic valve sclerosis, no significant stenosis, normal LV function.  She continues to have chronic lower extremity edema likely from venous insufficiency, exacerbated by obesity. Prior hemoglobin A1c 9.8, total cholesterol 223 earlier in 2014. She reports that these numbers have improved though they are not available as they were done in [place]. Her weight is up over the past week while she has been in [place]. Approximately 6 pounds by her account.  She reports history of pulmonary embolism in 2006. At that time she had shortness of breath and it felt like "Bronchitis". Workup in [place] confirmed PE.  Changed from warfarin to Eliquis by physician in [place]  EKG shows normal sinus rhythm with rate 85 beats per minute, no significant ST or T wave changes  Ultrasound August 2010 for possible cellulitis showed no DVT   Outpatient Encounter Prescriptions as of 08/03/2013  Medication Sig Dispense Refill  . allopurinol (ZYLOPRIM) 300 MG tablet Take 300 mg by mouth daily.      Marland Kitchen apixaban (ELIQUIS) 5 MG TABS tablet Take 5 mg by mouth 2 (two) times daily.      . Cholecalciferol (VITAMIN D) 1000 UNITS capsule Take 2,000 Units by mouth daily.      Marland Kitchen esomeprazole (NEXIUM) 40 MG capsule Take 40 mg by mouth daily before breakfast.      . fenofibrate 54 MG tablet Take 54 mg by mouth daily.      . insulin detemir (LEVEMIR) 100 UNIT/ML injection 60units qam and 25units at bedtime  10 mL   6  . insulin lispro (HUMALOG KWIKPEN) 100 UNIT/ML injection Inject 5 Units into the skin 3 (three) times daily before meals.  10 mL  6  . Liraglutide (VICTOZA) 18 MG/3ML SOLN Inject 1.8 mg into the skin daily.      . Multiple Vitamin (MULTIVITAMIN) tablet Take 1 tablet by mouth daily.      . rosuvastatin (CRESTOR) 10 MG tablet Take 10 mg by mouth daily.      . sitaGLIPtan-metformin (JANUMET) 50-1000 MG per tablet Take 1 tablet by mouth daily.      . valsartan-hydrochlorothiazide (DIOVAN-HCT) 160-12.5 MG per tablet Take 1 tablet by mouth daily.        Review of Systems  Constitutional: Negative.   HENT: Negative.   Eyes: Negative.   Cardiovascular: Positive for leg swelling.  Gastrointestinal: Negative.   Musculoskeletal: Negative.   Skin: Negative.   Neurological: Negative.   Psychiatric/Behavioral: Negative.   All other systems reviewed and are negative.    BP 130/82  Pulse 85  Ht 5' 1.5" (1.562 m)  Wt 323 lb 8 oz (146.739 kg)  BMI 60.14 kg/m2  Physical Exam  Nursing note and vitals reviewed. Constitutional: She is oriented to person, place, and time. She appears well-developed and well-nourished.  Obese  HENT:  Head: Normocephalic.  Nose: Nose normal.  Mouth/Throat: Oropharynx is clear and moist.  Eyes: Conjunctivae are normal. Pupils are equal, round, and reactive  to light.  Neck: Normal range of motion. Neck supple. No JVD present.  Cardiovascular: Normal rate, regular rhythm, S1 normal, S2 normal and intact distal pulses.  Exam reveals no gallop and no friction rub.   Murmur heard.  Crescendo systolic murmur is present with a grade of 1/6  Nonpitting edema lower extremities bilaterally  Pulmonary/Chest: Effort normal and breath sounds normal. No respiratory distress. She has no wheezes. She has no rales. She exhibits no tenderness.  Abdominal: Soft. Bowel sounds are normal. She exhibits no distension. There is no tenderness.  Musculoskeletal: Normal range of motion.  She exhibits no edema and no tenderness.  Lymphadenopathy:    She has no cervical adenopathy.  Neurological: She is alert and oriented to person, place, and time. Coordination normal.  Skin: Skin is warm and dry. No rash noted. No erythema.  Skin changes of the lower extremities from chronic swelling/venous insufficiency.  Psychiatric: She has a normal mood and affect. Her behavior is normal. Judgment and thought content normal.    Assessment and Plan

## 2013-08-03 NOTE — Assessment & Plan Note (Signed)
Wt Readings from Last 3 Encounters:  08/03/13 323 lb (146.512 kg)  08/03/13 323 lb 8 oz (146.739 kg)  02/13/13 321 lb (145.605 kg)   Encouraged effort at weight loss with healthy diet, low in processed carbohydrates and high in fiber and lean protein. Gave info on Mediterranean Diet and My[employer]itness[employer]. Follow up 6 months after return from [place].

## 2013-08-03 NOTE — Assessment & Plan Note (Signed)
Minimal aortic valve sclerosis/minimal stenosis noted. No further workup needed

## 2013-08-03 NOTE — Assessment & Plan Note (Signed)
Encouraged Ace wraps, compression hose, leg elevation. She has followup with vascular for vein mapping.

## 2013-08-03 NOTE — Assessment & Plan Note (Signed)
Blood pressure is well controlled on today's visit. No changes made to the medications. 

## 2013-08-03 NOTE — Patient Instructions (Addendum)
You are doing well. No medication changes were made.  Please call us if you have new issues that need to be addressed before your next appt.  Your physician wants you to follow-up in: 6 months.  You will receive a reminder letter in the mail two months in advance. If you don't receive a letter, please call our office to schedule the follow-up appointment.   

## 2013-10-04 ENCOUNTER — Other Ambulatory Visit: Payer: Self-pay

## 2014-01-15 ENCOUNTER — Ambulatory Visit: Payer: PRIVATE HEALTH INSURANCE | Admitting: Internal Medicine

## 2014-01-30 ENCOUNTER — Encounter: Payer: Self-pay | Admitting: Cardiovascular Disease

## 2014-01-30 ENCOUNTER — Telehealth: Payer: Self-pay

## 2014-01-30 ENCOUNTER — Ambulatory Visit (INDEPENDENT_AMBULATORY_CARE_PROVIDER_SITE_OTHER): Payer: PRIVATE HEALTH INSURANCE | Admitting: Cardiovascular Disease

## 2014-01-30 VITALS — BP 142/80 | HR 92 | Ht 61.5 in | Wt 319.0 lb

## 2014-01-30 DIAGNOSIS — I359 Nonrheumatic aortic valve disorder, unspecified: Secondary | ICD-10-CM

## 2014-01-30 DIAGNOSIS — I350 Nonrheumatic aortic (valve) stenosis: Secondary | ICD-10-CM

## 2014-01-30 DIAGNOSIS — R0602 Shortness of breath: Secondary | ICD-10-CM

## 2014-01-30 DIAGNOSIS — E119 Type 2 diabetes mellitus without complications: Secondary | ICD-10-CM

## 2014-01-30 DIAGNOSIS — I10 Essential (primary) hypertension: Secondary | ICD-10-CM

## 2014-01-30 NOTE — Patient Instructions (Signed)
You are doing well. No medication changes were made.  Please call us if you have new issues that need to be addressed before your next appt.  Your physician wants you to follow-up in: 12 months.  You will receive a reminder letter in the mail two months in advance. If you don't receive a letter, please call our office to schedule the follow-up appointment. 

## 2014-01-30 NOTE — Telephone Encounter (Signed)
Pt called stating that at her ov this am, she was told that if the scratch on her leg worsened, Dr. Mariah MillingGollan would call her in an abx. She reports that her leg has gotten worse and she would like something called in.  Please advise.  Thank you.

## 2014-01-30 NOTE — Assessment & Plan Note (Signed)
Very mild murmur on exam. Prior echocardiogram showing mild aortic valve stenosis. Can monitor with periodic clinical exam for now

## 2014-01-30 NOTE — Progress Notes (Signed)
Patient ID: Sonya ReaperEmilia Sanchez, female    DOB: [birth year], 58 y.o.   MRN: [id number]  HPI Comments:  58 year old woman who lives in [place]  in the winter and [place] the rest of the year, history of morbid obesity, DVT/PE on anticoagulation, diabetes, prior smoking history,  hypertension, strong family history of heart disease with parents dying in their 2050s from heart attack  who presents for routine followup.   History of murmur Prior echocardiogram showed mild aortic valve sclerosis, no significantstenosis, normal LV function.  In followup today, she reports that she is doing well. No significant changes in her health over the past year. She is on Crestor with improved cholesterol numbers. Cholesterol down from 223, most recently 164, LDL 104  She continues to have chronic lower extremity edema likely from venous insufficiency, exacerbated by obesity.  hemoglobin A1c 9.8,  Continues to have a problem with her weight  She reports history of pulmonary embolism in 2006. At that time she had shortness of breath and it felt like "Bronchitis". Workup in [place] confirmed PE.  Changed from warfarin to Eliquis by physician in [place]  EKG shows normal sinus rhythm with rate 92 beats per minute, no significant ST or T wave changes  Ultrasound August 2010 for possible cellulitis showed no DVT   Outpatient Encounter Prescriptions as of 01/30/2014  Medication Sig  . allopurinol (ZYLOPRIM) 300 MG tablet Take 300 mg by mouth daily.  Marland Kitchen. apixaban (ELIQUIS) 5 MG TABS tablet Take 5 mg by mouth 2 (two) times daily.  . Cholecalciferol (VITAMIN D) 1000 UNITS capsule Take 2,000 Units by mouth daily.  Marland Kitchen. esomeprazole (NEXIUM) 40 MG capsule Take 40 mg by mouth daily before breakfast.  . fenofibrate 54 MG tablet Take 54 mg by mouth daily.  . insulin detemir (LEVEMIR) 100 UNIT/ML injection 60units qam and 25units at bedtime  . insulin lispro (HUMALOG KWIKPEN) 100 UNIT/ML injection Inject 5 Units into the skin  3 (three) times daily before meals.  . Liraglutide (VICTOZA) 18 MG/3ML SOLN Inject 1.8 mg into the skin daily.  . Multiple Vitamin (MULTIVITAMIN) tablet Take 1 tablet by mouth daily.  . rosuvastatin (CRESTOR) 10 MG tablet Take 10 mg by mouth daily.  . sitaGLIPtan-metformin (JANUMET) 50-1000 MG per tablet Take 1 tablet by mouth daily.  . valsartan-hydrochlorothiazide (DIOVAN-HCT) 160-12.5 MG per tablet Take 1 tablet by mouth daily.    Review of Systems  Constitutional: Negative.   HENT: Negative.   Eyes: Negative.   Cardiovascular: Positive for leg swelling.  Gastrointestinal: Negative.   Musculoskeletal: Negative.   Skin: Negative.   Neurological: Negative.   Psychiatric/Behavioral: Negative.   All other systems reviewed and are negative.    BP 142/80  Pulse 92  Ht 5' 1.5" (1.562 m)  Wt 319 lb (144.697 kg)  BMI 59.31 kg/m2  Physical Exam  Nursing note and vitals reviewed. Constitutional: She is oriented to person, place, and time. She appears well-developed and well-nourished.  Obese  HENT:  Head: Normocephalic.  Nose: Nose normal.  Mouth/Throat: Oropharynx is clear and moist.  Eyes: Conjunctivae are normal. Pupils are equal, round, and reactive to light.  Neck: Normal range of motion. Neck supple. No JVD present.  Cardiovascular: Normal rate, regular rhythm, S1 normal, S2 normal and intact distal pulses.  Exam reveals no gallop and no friction rub.   Murmur heard.  Crescendo systolic murmur is present with a grade of 1/6  Nonpitting edema lower extremities bilaterally  Pulmonary/Chest: Effort normal and  breath sounds normal. No respiratory distress. She has no wheezes. She has no rales. She exhibits no tenderness.  Abdominal: Soft. Bowel sounds are normal. She exhibits no distension. There is no tenderness.  Musculoskeletal: Normal range of motion. She exhibits no edema and no tenderness.  Lymphadenopathy:    She has no cervical adenopathy.  Neurological: She is alert  and oriented to person, place, and time. Coordination normal.  Skin: Skin is warm and dry. No rash noted. No erythema.  Skin changes of the lower extremities from chronic swelling/venous insufficiency.  Psychiatric: She has a normal mood and affect. Her behavior is normal. Judgment and thought content normal.    Assessment and Plan

## 2014-01-30 NOTE — Telephone Encounter (Signed)
Spoke w/ pt.  Advised her that I had spoken w/ Dr. Mariah MillingGollan and he recommends that she monitor sx and call if they worsen, as she did not have an infection at her ov this am.  Asked to come by the office if she feels that it is worsening so that Dr. Mariah MillingGollan may take a look at it.  She is agreeable to this and states that she put betadine and neosporin on her leg this afternoon.

## 2014-01-30 NOTE — Assessment & Plan Note (Signed)
Likely related to deconditioning, obesity Recommended continued efforts for weight loss

## 2014-01-30 NOTE — Assessment & Plan Note (Signed)
Her diabetes control is her biggest issue. Recommended strict diet control

## 2014-01-30 NOTE — Assessment & Plan Note (Signed)
Blood pressure is well controlled on today's visit. No changes made to the medications. 

## 2014-03-07 ENCOUNTER — Other Ambulatory Visit: Payer: Self-pay

## 2014-06-12 ENCOUNTER — Encounter: Payer: Self-pay | Admitting: *Deleted

## 2014-06-14 NOTE — Telephone Encounter (Signed)
Mailed unread message to pt  

## 2014-06-21 NOTE — Telephone Encounter (Signed)
Chart reviewed for DM bundle. Pt is past due for appointment and labs. Mychart message was sent.

## 2014-07-05 ENCOUNTER — Telehealth: Payer: Self-pay | Admitting: *Deleted

## 2014-07-05 NOTE — Telephone Encounter (Signed)
Chart reviewed for DM bundle. Per last note, pt lives in [place] as well. Left message, notifying she is due for a follow up visit as well as fasting labs. Recommended call back to office to schedule when she is in town again.

## 2014-12-02 ENCOUNTER — Ambulatory Visit: Payer: PRIVATE HEALTH INSURANCE | Admitting: Internal Medicine

## 2015-01-01 ENCOUNTER — Ambulatory Visit: Payer: PRIVATE HEALTH INSURANCE | Admitting: Internal Medicine

## 2015-01-28 ENCOUNTER — Ambulatory Visit: Payer: PRIVATE HEALTH INSURANCE | Admitting: Internal Medicine

## 2015-01-30 ENCOUNTER — Ambulatory Visit (INDEPENDENT_AMBULATORY_CARE_PROVIDER_SITE_OTHER): Payer: PRIVATE HEALTH INSURANCE | Admitting: Cardiovascular Disease

## 2015-01-30 ENCOUNTER — Encounter: Payer: Self-pay | Admitting: Cardiovascular Disease

## 2015-01-30 VITALS — BP 140/80 | HR 90 | Ht 62.0 in | Wt 311.8 lb

## 2015-01-30 DIAGNOSIS — I872 Venous insufficiency (chronic) (peripheral): Secondary | ICD-10-CM

## 2015-01-30 DIAGNOSIS — E785 Hyperlipidemia, unspecified: Secondary | ICD-10-CM

## 2015-01-30 DIAGNOSIS — Z7189 Other specified counseling: Secondary | ICD-10-CM

## 2015-01-30 DIAGNOSIS — I350 Nonrheumatic aortic (valve) stenosis: Secondary | ICD-10-CM

## 2015-01-30 DIAGNOSIS — I159 Secondary hypertension, unspecified: Secondary | ICD-10-CM

## 2015-01-30 DIAGNOSIS — E118 Type 2 diabetes mellitus with unspecified complications: Secondary | ICD-10-CM

## 2015-01-30 NOTE — Assessment & Plan Note (Signed)
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 Recommended she wear compression hose bilaterally

## 2015-01-30 NOTE — Assessment & Plan Note (Signed)
Encouraged weight loss, starting exercise program. Continue Crestor

## 2015-01-30 NOTE — Patient Instructions (Signed)
You are doing well. No medication changes were made.  Please call us if you have new issues that need to be addressed before your next appt.  Your physician wants you to follow-up in: 12 months.  You will receive a reminder letter in the mail two months in advance. If you don't receive a letter, please call our office to schedule the follow-up appointment. 

## 2015-01-30 NOTE — Assessment & Plan Note (Signed)
Poorly controlled by history. Weight continues to be a major issue. Talked with her about various diet changes. Recommended she seek close follow-up when she goes back to [place] in several weeks' time

## 2015-01-30 NOTE — Assessment & Plan Note (Signed)
Her weight is a major issue. We have encouraged exercise, careful diet management in an effort to lose weight.

## 2015-01-30 NOTE — Progress Notes (Signed)
Patient ID: Sonya Sanchez, female    DOB: 1956-05-14, 59 y.o.   MRN: [id number]  HPI Comments:  59 year old woman who lives in [place]  in the winter and [place] the rest of the year, history of morbid obesity, DVT/PE on anticoagulation, diabetes, prior smoking history,  hypertension, strong family history of heart disease with parents dying in their 5850s from heart attack  who presents for routine followup of her blood pressure History of murmur Prior echocardiogram showed mild aortic valve sclerosis, no significantstenosis, normal LV function.  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 She currently feels well, denies any significant shortness of breath. She has chronic leg edema. Weight continues to be a problem. No regular exercise program. She has not had close follow-up with her diabetes while in [place] recently. Previously was poorly controlled. She reports her sugars are roughly 300. She is going back to [place] at the end of the month and will seek follow-up there No recent labs in our system.  EKG on today's visit shows normal sinus rhythm with rate 90 bpm, no significant ST or T-wave changes Cholesterol 164, LDL 104  She continues to have chronic lower extremity edema likely from venous insufficiency, exacerbated by obesity.  hemoglobin A1c 9.8,   She reports history of pulmonary embolism in 2006. At that time she had shortness of breath and it felt like "Bronchitis". Workup in [place] confirmed PE.  Changed from warfarin to Eliquis by physician in [place]  EKG shows normal sinus rhythm with rate 92 beats per minute, no significant ST or T wave changes  Ultrasound August 2010 for possible cellulitis showed no DVT  Allergies  Allergen Reactions  . Sulfa Antibiotics     Outpatient Encounter Prescriptions as of 01/30/2015  Medication Sig  . allopurinol (ZYLOPRIM) 300 MG tablet  Take 300 mg by mouth daily.  Marland Kitchen. apixaban (ELIQUIS) 5 MG TABS tablet Take 5 mg by mouth 2 (two) times daily.  Marland Kitchen. BYDUREON 2 MG PEN 2 mg once a week.   . Cholecalciferol (VITAMIN D) 1000 UNITS capsule Take 2,000 Units by mouth daily.  Marland Kitchen. esomeprazole (NEXIUM) 40 MG capsule Take 40 mg by mouth daily before breakfast.  . fenofibrate 54 MG tablet Take 54 mg by mouth daily.  . furosemide (LASIX) 20 MG tablet Take 20 mg by mouth daily.   . insulin detemir (LEVEMIR) 100 UNIT/ML injection 60units qam and 25units at bedtime  . insulin lispro (HUMALOG KWIKPEN) 100 UNIT/ML injection Inject 5 Units into the skin 3 (three) times daily before meals.  . Multiple Vitamin (MULTIVITAMIN) tablet Take 1 tablet by mouth daily.  . rosuvastatin (CRESTOR) 10 MG tablet Take 10 mg by mouth daily.  . sitaGLIPtan-metformin (JANUMET) 50-1000 MG per tablet Take 1 tablet by mouth daily.  . valsartan-hydrochlorothiazide (DIOVAN-HCT) 160-12.5 MG per tablet Take 1 tablet by mouth daily.  . [DISCONTINUED] Liraglutide (VICTOZA) 18 MG/3ML SOLN Inject 1.8 mg into the skin daily.    Past Medical History  Diagnosis Date  . Heart murmur   . Diabetes mellitus without complication   . Urinary tract infection, site not specified   . Need for prophylactic vaccination and inoculation against influenza   . Gout, unspecified   . Calculus of kidney   . Unspecified sleep apnea   . Type II or unspecified type diabetes mellitus without mention of complication, uncontrolled   . Morbid obesity   .  Phlebitis and thrombophlebitis of other deep vessels of lower extremities   . Other pulmonary embolism and infarction   . Phlebitis and thrombophlebitis of other deep vessels of lower extremities   . Spinal stenosis, unspecified region other than cervical   . Hyperlipidemia   . Hypertension   . Normal cardiac stress test     [place]    Past Surgical History  Procedure Laterality Date  . Abdominal hysterectomy      hyperplasia of endometrium  .  Stomach surgery    . Intestinal bypass      ovarian cyst ruptured, led to perforated intestine  . Appendectomy    . Cholecystectomy    . Ankle surgery      fracture s/p pin, right ankle  . Colectomy      temporary colostomy, now reversed    Social History  reports that she quit smoking about 15 years ago. She does not have any smokeless tobacco history on file. She reports that she does not drink alcohol or use illicit drugs.  Family History family history includes Arthritis in her sister; Cancer in her mother; Heart attack (age of onset: 8) in her mother; Heart attack (age of onset: 75) in her father.  Review of Systems  Constitutional: Negative.   HENT: Negative.   Eyes: Negative.   Cardiovascular: Positive for leg swelling.  Gastrointestinal: Negative.   Musculoskeletal: Negative.   Skin: Negative.   Neurological: Negative.   Psychiatric/Behavioral: Negative.   All other systems reviewed and are negative.   BP 140/80 mmHg  Pulse 90  Ht  (1.575 m)  Wt 311 lb 12 oz (141.409 kg)  BMI 57.01 kg/m2  Physical Exam  Constitutional: She is oriented to person, place, and time. She appears well-developed and well-nourished.  Obese  HENT:  Head: Normocephalic.  Nose: Nose normal.  Mouth/Throat: Oropharynx is clear and moist.  Eyes: Conjunctivae are normal. Pupils are equal, round, and reactive to light.  Neck: Normal range of motion. Neck supple. No JVD present.  Cardiovascular: Normal rate, regular rhythm, S1 normal, S2 normal and intact distal pulses.  Exam reveals no gallop and no friction rub.   Murmur heard.  Crescendo systolic murmur is present with a grade of 1/6  Nonpitting edema lower extremities bilaterally  Pulmonary/Chest: Effort normal and breath sounds normal. No respiratory distress. She has no wheezes. She has no rales. She exhibits no tenderness.  Abdominal: Soft. Bowel sounds are normal. She exhibits no distension. There is no tenderness.   Musculoskeletal: Normal range of motion. She exhibits no edema or tenderness.  Lymphadenopathy:    She has no cervical adenopathy.  Neurological: She is alert and oriented to person, place, and time. Coordination normal.  Skin: Skin is warm and dry. No rash noted. No erythema.  Skin changes of the lower extremities from chronic swelling/venous insufficiency.  Psychiatric: She has a normal mood and affect. Her behavior is normal. Judgment and thought content normal.    Assessment and Plan  Nursing note and vitals reviewed.

## 2015-01-30 NOTE — Assessment & Plan Note (Signed)
Blood pressure is well controlled on today's visit. No changes made to the medications. 

## 2015-01-30 NOTE — Assessment & Plan Note (Signed)
High risk of DVT, PE. Will likely require lifelong anticoagulation

## 2015-08-07 ENCOUNTER — Inpatient Hospital Stay
Admission: EM | Admit: 2015-08-07 | Discharge: 2015-08-11 | DRG: 871 | Disposition: A | Discharge status: Home / Self Care | Payer: No Typology Code available for payment source | Attending: Internal Medicine | Admitting: Internal Medicine

## 2015-08-07 ENCOUNTER — Emergency Department: Payer: No Typology Code available for payment source

## 2015-08-07 DIAGNOSIS — Z87442 Personal history of urinary calculi: Secondary | ICD-10-CM

## 2015-08-07 DIAGNOSIS — E785 Hyperlipidemia, unspecified: Secondary | ICD-10-CM | POA: Diagnosis present

## 2015-08-07 DIAGNOSIS — Z9114 Patient's other noncompliance with medication regimen: Secondary | ICD-10-CM | POA: Diagnosis present

## 2015-08-07 DIAGNOSIS — E1159 Type 2 diabetes mellitus with other circulatory complications: Secondary | ICD-10-CM | POA: Diagnosis present

## 2015-08-07 DIAGNOSIS — Z87891 Personal history of nicotine dependence: Secondary | ICD-10-CM

## 2015-08-07 DIAGNOSIS — I10 Essential (primary) hypertension: Secondary | ICD-10-CM | POA: Diagnosis present

## 2015-08-07 DIAGNOSIS — A419 Sepsis, unspecified organism: Secondary | ICD-10-CM | POA: Diagnosis present

## 2015-08-07 DIAGNOSIS — Z8249 Family history of ischemic heart disease and other diseases of the circulatory system: Secondary | ICD-10-CM

## 2015-08-07 DIAGNOSIS — T501X5A Adverse effect of loop [high-ceiling] diuretics, initial encounter: Secondary | ICD-10-CM | POA: Diagnosis present

## 2015-08-07 DIAGNOSIS — Z6841 Body Mass Index (BMI) 40.0 and over, adult: Secondary | ICD-10-CM

## 2015-08-07 DIAGNOSIS — I248 Other forms of acute ischemic heart disease: Secondary | ICD-10-CM | POA: Diagnosis present

## 2015-08-07 DIAGNOSIS — E1165 Type 2 diabetes mellitus with hyperglycemia: Secondary | ICD-10-CM

## 2015-08-07 DIAGNOSIS — R778 Other specified abnormalities of plasma proteins: Secondary | ICD-10-CM

## 2015-08-07 DIAGNOSIS — I2699 Other pulmonary embolism without acute cor pulmonale: Secondary | ICD-10-CM

## 2015-08-07 DIAGNOSIS — Z7982 Long term (current) use of aspirin: Secondary | ICD-10-CM

## 2015-08-07 DIAGNOSIS — I5033 Acute on chronic diastolic (congestive) heart failure: Secondary | ICD-10-CM | POA: Diagnosis present

## 2015-08-07 DIAGNOSIS — R739 Hyperglycemia, unspecified: Secondary | ICD-10-CM

## 2015-08-07 DIAGNOSIS — A4159 Other Gram-negative sepsis: Principal | ICD-10-CM | POA: Diagnosis present

## 2015-08-07 DIAGNOSIS — J9601 Acute respiratory failure with hypoxia: Secondary | ICD-10-CM | POA: Diagnosis present

## 2015-08-07 DIAGNOSIS — Z794 Long term (current) use of insulin: Secondary | ICD-10-CM

## 2015-08-07 DIAGNOSIS — Z8672 Personal history of thrombophlebitis: Secondary | ICD-10-CM

## 2015-08-07 DIAGNOSIS — Z86718 Personal history of other venous thrombosis and embolism: Secondary | ICD-10-CM

## 2015-08-07 DIAGNOSIS — G4733 Obstructive sleep apnea (adult) (pediatric): Secondary | ICD-10-CM | POA: Diagnosis present

## 2015-08-07 DIAGNOSIS — M109 Gout, unspecified: Secondary | ICD-10-CM | POA: Diagnosis present

## 2015-08-07 DIAGNOSIS — E119 Type 2 diabetes mellitus without complications: Secondary | ICD-10-CM

## 2015-08-07 DIAGNOSIS — N3000 Acute cystitis without hematuria: Secondary | ICD-10-CM

## 2015-08-07 DIAGNOSIS — R7989 Other specified abnormal findings of blood chemistry: Secondary | ICD-10-CM

## 2015-08-07 DIAGNOSIS — Z86711 Personal history of pulmonary embolism: Secondary | ICD-10-CM

## 2015-08-07 DIAGNOSIS — I350 Nonrheumatic aortic (valve) stenosis: Secondary | ICD-10-CM | POA: Diagnosis present

## 2015-08-07 DIAGNOSIS — R6520 Severe sepsis without septic shock: Secondary | ICD-10-CM | POA: Diagnosis present

## 2015-08-07 DIAGNOSIS — E876 Hypokalemia: Secondary | ICD-10-CM | POA: Diagnosis present

## 2015-08-07 DIAGNOSIS — I872 Venous insufficiency (chronic) (peripheral): Secondary | ICD-10-CM | POA: Diagnosis present

## 2015-08-07 HISTORY — DX: Family history of ischemic heart disease and other diseases of the circulatory system: Z82.49

## 2015-08-07 HISTORY — DX: Tobacco use: Z72.0

## 2015-08-07 HISTORY — DX: Other pulmonary embolism without acute cor pulmonale: I26.99

## 2015-08-07 HISTORY — DX: Acute embolism and thrombosis of unspecified deep veins of unspecified lower extremity: I82.409

## 2015-08-07 LAB — CBC
HCT: 41.5 % (ref 35.0–47.0)
Hemoglobin: 13.4 g/dL (ref 12.0–16.0)
MCH: 27.1 pg (ref 26.0–34.0)
MCHC: 32.4 g/dL (ref 32.0–36.0)
MCV: 83.9 fL (ref 80.0–100.0)
Platelets: 148 10*3/uL — ABNORMAL LOW (ref 150–440)
RBC: 4.95 MIL/uL (ref 3.80–5.20)
RDW: 15.1 % — AB (ref 11.5–14.5)
WBC: 7 10*3/uL (ref 3.6–11.0)

## 2015-08-07 MED ORDER — INSULIN ASPART 100 UNIT/ML ~~LOC~~ SOLN
8.0000 [IU] | Freq: Once | SUBCUTANEOUS | Status: AC
  Administered 2015-08-07: 8 [IU] via INTRAVENOUS

## 2015-08-07 MED ORDER — INSULIN ASPART 100 UNIT/ML ~~LOC~~ SOLN
SUBCUTANEOUS | Status: AC
  Administered 2015-08-07: 8 [IU] via INTRAVENOUS
  Filled 2015-08-07: qty 8

## 2015-08-07 MED ORDER — SODIUM CHLORIDE 0.9 % IV BOLUS (SEPSIS)
1000.0000 mL | Freq: Once | INTRAVENOUS | Status: AC
  Administered 2015-08-08: 1000 mL via INTRAVENOUS

## 2015-08-07 MED ORDER — SODIUM CHLORIDE 0.9 % IV BOLUS (SEPSIS)
1000.0000 mL | Freq: Once | INTRAVENOUS | Status: AC
  Administered 2015-08-07: 1000 mL via INTRAVENOUS

## 2015-08-07 NOTE — ED Notes (Signed)
Pt arrived from [facility] home via EMS reporting hyperglycemia. Pt reports increased thirst and increased urination. Pt reportedly not taking medications x 3 days. Pt took 65u of levamir and 121 u of humalog. Pt was 74% on RA upon EMS arrival and stats increased to 96% when placed on a nonrebreather. Pt alert and oriented at this time.

## 2015-08-08 ENCOUNTER — Inpatient Hospital Stay: Payer: No Typology Code available for payment source

## 2015-08-08 ENCOUNTER — Encounter: Payer: Self-pay | Admitting: Internal Medicine

## 2015-08-08 DIAGNOSIS — E1159 Type 2 diabetes mellitus with other circulatory complications: Secondary | ICD-10-CM

## 2015-08-08 DIAGNOSIS — I248 Other forms of acute ischemic heart disease: Secondary | ICD-10-CM | POA: Diagnosis present

## 2015-08-08 DIAGNOSIS — E1151 Type 2 diabetes mellitus with diabetic peripheral angiopathy without gangrene: Secondary | ICD-10-CM | POA: Diagnosis not present

## 2015-08-08 DIAGNOSIS — I10 Essential (primary) hypertension: Secondary | ICD-10-CM | POA: Diagnosis present

## 2015-08-08 DIAGNOSIS — I214 Non-ST elevation (NSTEMI) myocardial infarction: Secondary | ICD-10-CM | POA: Diagnosis not present

## 2015-08-08 DIAGNOSIS — E1165 Type 2 diabetes mellitus with hyperglycemia: Secondary | ICD-10-CM | POA: Diagnosis present

## 2015-08-08 DIAGNOSIS — E785 Hyperlipidemia, unspecified: Secondary | ICD-10-CM | POA: Diagnosis present

## 2015-08-08 DIAGNOSIS — Z87891 Personal history of nicotine dependence: Secondary | ICD-10-CM | POA: Diagnosis not present

## 2015-08-08 DIAGNOSIS — Z7982 Long term (current) use of aspirin: Secondary | ICD-10-CM | POA: Diagnosis not present

## 2015-08-08 DIAGNOSIS — Z86718 Personal history of other venous thrombosis and embolism: Secondary | ICD-10-CM | POA: Diagnosis not present

## 2015-08-08 DIAGNOSIS — I5033 Acute on chronic diastolic (congestive) heart failure: Secondary | ICD-10-CM | POA: Diagnosis present

## 2015-08-08 DIAGNOSIS — A419 Sepsis, unspecified organism: Secondary | ICD-10-CM | POA: Diagnosis present

## 2015-08-08 DIAGNOSIS — M109 Gout, unspecified: Secondary | ICD-10-CM | POA: Diagnosis present

## 2015-08-08 DIAGNOSIS — E876 Hypokalemia: Secondary | ICD-10-CM | POA: Diagnosis present

## 2015-08-08 DIAGNOSIS — R6520 Severe sepsis without septic shock: Secondary | ICD-10-CM | POA: Diagnosis present

## 2015-08-08 DIAGNOSIS — R739 Hyperglycemia, unspecified: Secondary | ICD-10-CM

## 2015-08-08 DIAGNOSIS — N3000 Acute cystitis without hematuria: Secondary | ICD-10-CM | POA: Diagnosis present

## 2015-08-08 DIAGNOSIS — I350 Nonrheumatic aortic (valve) stenosis: Secondary | ICD-10-CM | POA: Diagnosis present

## 2015-08-08 DIAGNOSIS — I872 Venous insufficiency (chronic) (peripheral): Secondary | ICD-10-CM | POA: Diagnosis present

## 2015-08-08 DIAGNOSIS — Z87442 Personal history of urinary calculi: Secondary | ICD-10-CM | POA: Diagnosis not present

## 2015-08-08 DIAGNOSIS — Z86711 Personal history of pulmonary embolism: Secondary | ICD-10-CM | POA: Diagnosis not present

## 2015-08-08 DIAGNOSIS — R079 Chest pain, unspecified: Secondary | ICD-10-CM | POA: Diagnosis not present

## 2015-08-08 DIAGNOSIS — G4733 Obstructive sleep apnea (adult) (pediatric): Secondary | ICD-10-CM | POA: Diagnosis present

## 2015-08-08 DIAGNOSIS — Z8249 Family history of ischemic heart disease and other diseases of the circulatory system: Secondary | ICD-10-CM | POA: Diagnosis not present

## 2015-08-08 DIAGNOSIS — Z794 Long term (current) use of insulin: Secondary | ICD-10-CM | POA: Diagnosis not present

## 2015-08-08 DIAGNOSIS — J9601 Acute respiratory failure with hypoxia: Secondary | ICD-10-CM | POA: Diagnosis present

## 2015-08-08 DIAGNOSIS — IMO0001 Reserved for inherently not codable concepts without codable children: Secondary | ICD-10-CM | POA: Insufficient documentation

## 2015-08-08 DIAGNOSIS — Z6841 Body Mass Index (BMI) 40.0 and over, adult: Secondary | ICD-10-CM | POA: Diagnosis not present

## 2015-08-08 DIAGNOSIS — Z8672 Personal history of thrombophlebitis: Secondary | ICD-10-CM | POA: Diagnosis not present

## 2015-08-08 DIAGNOSIS — A4159 Other Gram-negative sepsis: Secondary | ICD-10-CM | POA: Diagnosis present

## 2015-08-08 DIAGNOSIS — Z9114 Patient's other noncompliance with medication regimen: Secondary | ICD-10-CM | POA: Diagnosis present

## 2015-08-08 DIAGNOSIS — T501X5A Adverse effect of loop [high-ceiling] diuretics, initial encounter: Secondary | ICD-10-CM | POA: Diagnosis present

## 2015-08-08 LAB — CBC
HCT: 37.1 % (ref 35.0–47.0)
HEMOGLOBIN: 11.9 g/dL — AB (ref 12.0–16.0)
MCH: 26.5 pg (ref 26.0–34.0)
MCHC: 32 g/dL (ref 32.0–36.0)
MCV: 82.6 fL (ref 80.0–100.0)
PLATELETS: 132 10*3/uL — AB (ref 150–440)
RBC: 4.49 MIL/uL (ref 3.80–5.20)
RDW: 15.5 % — ABNORMAL HIGH (ref 11.5–14.5)
WBC: 10.1 10*3/uL (ref 3.6–11.0)

## 2015-08-08 LAB — BASIC METABOLIC PANEL
ANION GAP: 12 (ref 5–15)
ANION GAP: 8 (ref 5–15)
BUN: 37 mg/dL — ABNORMAL HIGH (ref 6–20)
BUN: 39 mg/dL — AB (ref 6–20)
CALCIUM: 8.1 mg/dL — AB (ref 8.9–10.3)
CO2: 21 mmol/L — ABNORMAL LOW (ref 22–32)
CO2: 23 mmol/L (ref 22–32)
CREATININE: 1.41 mg/dL — AB (ref 0.44–1.00)
Calcium: 9 mg/dL (ref 8.9–10.3)
Chloride: 105 mmol/L (ref 101–111)
Chloride: 95 mmol/L — ABNORMAL LOW (ref 101–111)
Creatinine, Ser: 1.82 mg/dL — ABNORMAL HIGH (ref 0.44–1.00)
GFR calc Af Amer: 33 mL/min — ABNORMAL LOW (ref >60)
GFR calc non Af Amer: 29 mL/min — ABNORMAL LOW (ref >60)
GFR, EST AFRICAN AMERICAN: 46 mL/min — AB (ref >60)
GFR, EST NON AFRICAN AMERICAN: 39 mL/min — AB (ref >60)
GLUCOSE: 699 mg/dL — AB (ref 65–99)
Glucose, Bld: 409 mg/dL — ABNORMAL HIGH (ref 65–99)
POTASSIUM: 4.2 mmol/L (ref 3.5–5.1)
Potassium: 3.8 mmol/L (ref 3.5–5.1)
SODIUM: 134 mmol/L — AB (ref 135–145)
Sodium: 130 mmol/L — ABNORMAL LOW (ref 135–145)

## 2015-08-08 LAB — BLOOD GAS, ARTERIAL
ALLENS TEST (PASS/FAIL): POSITIVE — AB
Acid-base deficit: 1.9 mmol/L (ref 0.0–2.0)
Bicarbonate: 22.2 mEq/L (ref 21.0–28.0)
FIO2: 0.36
O2 Saturation: 92 %
PH ART: 7.41 (ref 7.350–7.450)
Patient temperature: 37
pCO2 arterial: 35 mmHg (ref 32.0–48.0)
pO2, Arterial: 63 mmHg — ABNORMAL LOW (ref 83.0–108.0)

## 2015-08-08 LAB — URINALYSIS COMPLETE WITH MICROSCOPIC (ARMC ONLY)
Bacteria, UA: NONE SEEN
Bilirubin Urine: NEGATIVE
NITRITE: NEGATIVE
PH: 5 (ref 5.0–8.0)
SPECIFIC GRAVITY, URINE: 1.026 (ref 1.005–1.030)

## 2015-08-08 LAB — GLUCOSE, CAPILLARY
GLUCOSE-CAPILLARY: 408 mg/dL — AB (ref 65–99)
GLUCOSE-CAPILLARY: 397 mg/dL — AB (ref 65–99)
GLUCOSE-CAPILLARY: 265 mg/dL — AB (ref 65–99)
GLUCOSE-CAPILLARY: 223 mg/dL — AB (ref 65–99)
GLUCOSE-CAPILLARY: 217 mg/dL — AB (ref 65–99)
GLUCOSE-CAPILLARY: 125 mg/dL — AB (ref 65–99)
GLUCOSE-CAPILLARY: 161 mg/dL — AB (ref 65–99)
GLUCOSE-CAPILLARY: 148 mg/dL — AB (ref 65–99)
GLUCOSE-CAPILLARY: 121 mg/dL — AB (ref 65–99)
Glucose-Capillary: 415 mg/dL — ABNORMAL HIGH (ref 65–99)
Glucose-Capillary: 321 mg/dL — ABNORMAL HIGH (ref 65–99)
Glucose-Capillary: 209 mg/dL — ABNORMAL HIGH (ref 65–99)
Glucose-Capillary: 427 mg/dL — ABNORMAL HIGH (ref 65–99)
Glucose-Capillary: >600 mg/dL (ref 65–99)
Glucose-Capillary: >600 mg/dL (ref 65–99)
Glucose-Capillary: 169 mg/dL — ABNORMAL HIGH (ref 65–99)
Glucose-Capillary: 162 mg/dL — ABNORMAL HIGH (ref 65–99)
Glucose-Capillary: 209 mg/dL — ABNORMAL HIGH (ref 65–99)
Glucose-Capillary: 196 mg/dL — ABNORMAL HIGH (ref 65–99)
Glucose-Capillary: 185 mg/dL — ABNORMAL HIGH (ref 65–99)
Glucose-Capillary: 161 mg/dL — ABNORMAL HIGH (ref 65–99)
Glucose-Capillary: 116 mg/dL — ABNORMAL HIGH (ref 65–99)

## 2015-08-08 LAB — HEMOGLOBIN A1C: HEMOGLOBIN A1C: 15.3 % — AB (ref 4.0–6.0)

## 2015-08-08 LAB — LACTIC ACID, PLASMA
Lactic Acid, Venous: 4 mmol/L (ref 0.5–2.0)
Lactic Acid, Venous: 1.2 mmol/L (ref 0.5–2.0)

## 2015-08-08 LAB — TROPONIN I
Troponin I: 0.11 ng/mL — ABNORMAL HIGH (ref <0.031)
Troponin I: 0.97 ng/mL — ABNORMAL HIGH (ref <0.031)
Troponin I: 1.06 ng/mL — ABNORMAL HIGH (ref <0.031)
Troponin I: 1.09 ng/mL — ABNORMAL HIGH (ref <0.031)

## 2015-08-08 MED ORDER — SODIUM CHLORIDE 0.9 % IV BOLUS (SEPSIS)
1000.0000 mL | Freq: Once | INTRAVENOUS | Status: AC
  Administered 2015-08-08: 1000 mL via INTRAVENOUS
  Filled 2015-08-08: qty 1000

## 2015-08-08 MED ORDER — DEXTROSE 5 % IV SOLN
2.0000 g | Freq: Once | INTRAVENOUS | Status: AC
  Administered 2015-08-08: 2 g via INTRAVENOUS

## 2015-08-08 MED ORDER — ONDANSETRON HCL 4 MG/2ML IJ SOLN: 4.0000 mg | Freq: Four times a day (QID) | INTRAMUSCULAR | Status: DC | PRN

## 2015-08-08 MED ORDER — DEXTROSE 5 % IV SOLN
2.0000 g | INTRAVENOUS | Status: DC
  Administered 2015-08-09 – 2015-08-11 (×3): 2 g via INTRAVENOUS
  Filled 2015-08-08 (×4): qty 2

## 2015-08-08 MED ORDER — FENOFIBRATE 54 MG PO TABS
54.0000 mg | ORAL_TABLET | Freq: Every day | ORAL | Status: DC
  Administered 2015-08-08 – 2015-08-11 (×4): 54 mg via ORAL
  Filled 2015-08-08 (×4): qty 1

## 2015-08-08 MED ORDER — SODIUM CHLORIDE 0.9 % IV SOLN
INTRAVENOUS | Status: DC
  Administered 2015-08-08: 3.5 [IU]/h via INTRAVENOUS
  Administered 2015-08-08: 7.5 [IU]/h via INTRAVENOUS
  Filled 2015-08-08: qty 2.5

## 2015-08-08 MED ORDER — VALSARTAN-HYDROCHLOROTHIAZIDE 160-12.5 MG PO TABS: 1.0000 | ORAL_TABLET | Freq: Every day | ORAL | Status: DC

## 2015-08-08 MED ORDER — MORPHINE SULFATE (PF) 2 MG/ML IV SOLN
2.0000 mg | INTRAVENOUS | Status: DC | PRN
  Administered 2015-08-08: 2 mg via INTRAVENOUS
  Filled 2015-08-08: qty 1

## 2015-08-08 MED ORDER — DEXTROSE 5 % IV SOLN
INTRAVENOUS | Status: AC
  Filled 2015-08-08: qty 10

## 2015-08-08 MED ORDER — HEPARIN SODIUM (PORCINE) 5000 UNIT/ML IJ SOLN: 5000.0000 [IU] | Freq: Three times a day (TID) | INTRAMUSCULAR | Status: DC

## 2015-08-08 MED ORDER — INSULIN DETEMIR 100 UNIT/ML ~~LOC~~ SOLN
30.0000 [IU] | Freq: Two times a day (BID) | SUBCUTANEOUS | Status: DC
  Administered 2015-08-09 – 2015-08-11 (×5): 30 [IU] via SUBCUTANEOUS
  Filled 2015-08-08 (×7): qty 0.3

## 2015-08-08 MED ORDER — ONDANSETRON HCL 4 MG PO TABS: 4.0000 mg | ORAL_TABLET | Freq: Four times a day (QID) | ORAL | Status: DC | PRN

## 2015-08-08 MED ORDER — ACETAMINOPHEN 650 MG RE SUPP: 650.0000 mg | Freq: Four times a day (QID) | RECTAL | Status: DC | PRN

## 2015-08-08 MED ORDER — SODIUM CHLORIDE 0.9 % IV SOLN
INTRAVENOUS | Status: DC
  Administered 2015-08-08 – 2015-08-09 (×3): via INTRAVENOUS

## 2015-08-08 MED ORDER — INSULIN ASPART 100 UNIT/ML ~~LOC~~ SOLN
0.0000 [IU] | Freq: Three times a day (TID) | SUBCUTANEOUS | Status: DC
  Administered 2015-08-09: 8 [IU] via SUBCUTANEOUS
  Administered 2015-08-09: 11 [IU] via SUBCUTANEOUS
  Administered 2015-08-09: 3 [IU] via SUBCUTANEOUS
  Administered 2015-08-10: 11 [IU] via SUBCUTANEOUS
  Administered 2015-08-10: 5 [IU] via SUBCUTANEOUS
  Administered 2015-08-10 – 2015-08-11 (×2): 8 [IU] via SUBCUTANEOUS
  Administered 2015-08-11: 3 [IU] via SUBCUTANEOUS
  Administered 2015-08-11: 8 [IU] via SUBCUTANEOUS
  Filled 2015-08-08 (×2): qty 8
  Filled 2015-08-08: qty 11
  Filled 2015-08-08 (×2): qty 3
  Filled 2015-08-08: qty 11
  Filled 2015-08-08: qty 5
  Filled 2015-08-08 (×2): qty 8

## 2015-08-08 MED ORDER — FUROSEMIDE 10 MG/ML IJ SOLN
40.0000 mg | Freq: Two times a day (BID) | INTRAMUSCULAR | Status: DC
  Administered 2015-08-08 – 2015-08-11 (×7): 40 mg via INTRAVENOUS
  Filled 2015-08-08 (×7): qty 4

## 2015-08-08 MED ORDER — INSULIN ASPART 100 UNIT/ML ~~LOC~~ SOLN
0.0000 [IU] | Freq: Every day | SUBCUTANEOUS | Status: DC
Start: 2015-08-08 — End: 2015-08-11
  Administered 2015-08-09 – 2015-08-10 (×2): 4 [IU] via SUBCUTANEOUS
  Filled 2015-08-08: qty 5
  Filled 2015-08-08: qty 4

## 2015-08-08 MED ORDER — IPRATROPIUM-ALBUTEROL 0.5-2.5 (3) MG/3ML IN SOLN: 3.0000 mL | RESPIRATORY_TRACT | Status: DC | PRN

## 2015-08-08 MED ORDER — SODIUM CHLORIDE 0.9 % IV BOLUS (SEPSIS)
1000.0000 mL | Freq: Once | INTRAVENOUS | Status: AC
  Administered 2015-08-08: 1000 mL via INTRAVENOUS

## 2015-08-08 MED ORDER — ALLOPURINOL 300 MG PO TABS
300.0000 mg | ORAL_TABLET | Freq: Every day | ORAL | Status: DC
  Administered 2015-08-08 – 2015-08-11 (×4): 300 mg via ORAL
  Filled 2015-08-08 (×4): qty 1

## 2015-08-08 MED ORDER — TECHNETIUM TC 99M DIETHYLENETRIAME-PENTAACETIC ACID
44.4000 | Freq: Once | INTRAVENOUS | Status: DC | PRN
  Administered 2015-08-08: 44.4 via RESPIRATORY_TRACT
  Filled 2015-08-08: qty 44.4

## 2015-08-08 MED ORDER — OXYCODONE HCL 5 MG PO TABS: 5.0000 mg | ORAL_TABLET | ORAL | Status: DC | PRN

## 2015-08-08 MED ORDER — VALSARTAN 160 MG PO TABS
160.0000 mg | ORAL_TABLET | Freq: Every day | ORAL | Status: DC
  Administered 2015-08-08 – 2015-08-11 (×4): 160 mg via ORAL
  Filled 2015-08-08 (×5): qty 1

## 2015-08-08 MED ORDER — ROSUVASTATIN CALCIUM 20 MG PO TABS
10.0000 mg | ORAL_TABLET | Freq: Every day | ORAL | Status: DC
  Administered 2015-08-08 – 2015-08-11 (×4): 10 mg via ORAL
  Filled 2015-08-08 (×4): qty 1

## 2015-08-08 MED ORDER — INSULIN DETEMIR 100 UNIT/ML ~~LOC~~ SOLN
30.0000 [IU] | Freq: Once | SUBCUTANEOUS | Status: AC
  Administered 2015-08-08: 30 [IU] via SUBCUTANEOUS
  Filled 2015-08-08: qty 0.3

## 2015-08-08 MED ORDER — ADULT MULTIVITAMIN W/MINERALS CH
1.0000 | ORAL_TABLET | Freq: Every day | ORAL | Status: DC
  Administered 2015-08-08 – 2015-08-11 (×4): 1 via ORAL
  Filled 2015-08-08 (×4): qty 1

## 2015-08-08 MED ORDER — LEVOFLOXACIN IN D5W 500 MG/100ML IV SOLN
500.0000 mg | Freq: Once | INTRAVENOUS | Status: AC
  Administered 2015-08-08: 500 mg via INTRAVENOUS
  Filled 2015-08-08: qty 100

## 2015-08-08 MED ORDER — ACETAMINOPHEN 325 MG PO TABS
650.0000 mg | ORAL_TABLET | Freq: Four times a day (QID) | ORAL | Status: DC | PRN
  Administered 2015-08-09 (×2): 650 mg via ORAL
  Filled 2015-08-08 (×2): qty 2

## 2015-08-08 MED ORDER — VITAMIN D 1000 UNITS PO TABS
2000.0000 [IU] | ORAL_TABLET | Freq: Every day | ORAL | Status: DC
  Administered 2015-08-08 – 2015-08-11 (×4): 2000 [IU] via ORAL
  Filled 2015-08-08 (×4): qty 2

## 2015-08-08 MED ORDER — PANTOPRAZOLE SODIUM 40 MG PO TBEC
40.0000 mg | DELAYED_RELEASE_TABLET | Freq: Every day | ORAL | Status: DC
  Administered 2015-08-08 – 2015-08-11 (×4): 40 mg via ORAL
  Filled 2015-08-08 (×4): qty 1

## 2015-08-08 MED ORDER — ASPIRIN 81 MG PO CHEW
81.0000 mg | CHEWABLE_TABLET | Freq: Every day | ORAL | Status: DC
  Administered 2015-08-08: 81 mg via ORAL
  Filled 2015-08-08: qty 1

## 2015-08-08 MED ORDER — HYDROCHLOROTHIAZIDE 12.5 MG PO CAPS
12.5000 mg | ORAL_CAPSULE | Freq: Every day | ORAL | Status: DC
  Administered 2015-08-08 – 2015-08-11 (×4): 12.5 mg via ORAL
  Filled 2015-08-08 (×4): qty 1

## 2015-08-08 MED ORDER — APIXABAN 5 MG PO TABS
5.0000 mg | ORAL_TABLET | Freq: Two times a day (BID) | ORAL | Status: DC
  Administered 2015-08-08 – 2015-08-11 (×7): 5 mg via ORAL
  Filled 2015-08-08 (×7): qty 1

## 2015-08-08 MED ORDER — SODIUM CHLORIDE 0.9 % IJ SOLN
3.0000 mL | Freq: Two times a day (BID) | INTRAMUSCULAR | Status: DC
  Administered 2015-08-08 – 2015-08-10 (×6): 3 mL via INTRAVENOUS

## 2015-08-08 MED ORDER — METOPROLOL SUCCINATE ER 25 MG PO TB24
25.0000 mg | ORAL_TABLET | Freq: Every day | ORAL | Status: DC
  Administered 2015-08-08 – 2015-08-11 (×4): 25 mg via ORAL
  Filled 2015-08-08 (×4): qty 1

## 2015-08-08 MED ORDER — TECHNETIUM TO 99M ALBUMIN AGGREGATED
4.3000 | Freq: Once | INTRAVENOUS | Status: AC | PRN
  Administered 2015-08-08: 4.3 via INTRAVENOUS

## 2015-08-08 NOTE — Progress Notes (Signed)
I called Dr. Juliene Pina and reported patient with gram negative rods in aerobic bottle. No new orders received since patient currently on rocephin for UTI.

## 2015-08-08 NOTE — Progress Notes (Signed)
[facility] -  at [facility]   PATIENT NAME: Sonya Sanchez    MR#:  [id number]  DATE OF BIRTH:  [birth year]  SUBJECTIVE:  Patient is doing well this morning. Patient reports that she has been off her Eliquis for a few days. She was diagnosed with PE/DVT  REVIEW OF SYSTEMS:    Review of Systems  Constitutional: Negative for fever, chills and malaise/fatigue.  HENT: Negative for sore throat.   Eyes: Negative for blurred vision.  Respiratory: Positive for shortness of breath. Negative for cough, hemoptysis and wheezing.   Cardiovascular: Negative for chest pain, palpitations and leg swelling.  Gastrointestinal: Negative for nausea, vomiting, abdominal pain, diarrhea and blood in stool.  Genitourinary: Negative for dysuria, urgency, frequency, hematuria and flank pain.  Musculoskeletal: Negative for back pain.  Neurological: Negative for dizziness, tremors and headaches.  Endo/Heme/Allergies: Does not bruise/bleed easily.    Tolerating Diet: Nothing by mouth      DRUG ALLERGIES:   Allergies  Allergen Reactions  . Sulfa Antibiotics Other (See Comments)    Reaction: isn't certain, thinks she ran a fever, or had a rash, maybe both.    VITALS:  Blood pressure 115/81, pulse 86, temperature 97.7 F (36.5 C), temperature source Oral, resp. rate 22, height 5\' 2"  (1.575 m), weight 138.347 kg (305 lb), SpO2 95 %.  PHYSICAL EXAMINATION:   Physical Exam  Constitutional: She is oriented to person, place, and time and well-developed, well-nourished, and in no distress. No distress.  HENT:  Head: Normocephalic.  Eyes: No scleral icterus.  Neck: Normal range of motion. Neck supple. No JVD present. No tracheal deviation present.  Cardiovascular: Normal rate, regular rhythm and normal heart sounds.  Exam reveals no gallop and no friction rub.   No murmur heard. Pulmonary/Chest: Effort normal and breath sounds normal. No respiratory distress. She has no  wheezes. She has no rales. She exhibits no tenderness.  Abdominal: Soft. Bowel sounds are normal. She exhibits no distension and no mass. There is no tenderness. There is no rebound and no guarding.  Musculoskeletal: Normal range of motion. She exhibits edema.  Neurological: She is alert and oriented to person, place, and time.  Skin: Skin is warm. No rash noted. No erythema.  Yeast under pannus  Psychiatric: Affect and judgment normal.      LABORATORY PANEL:   CBC  Recent Labs Lab 08/08/15 0601  WBC 10.1  HGB 11.9*  HCT 37.1  PLT 132*   ------------------------------------------------------------------------------------------------------------------  Chemistries   Recent Labs Lab 08/08/15 0601  NA 134*  K 3.8  CL 105  CO2 21*  GLUCOSE 409*  BUN 37*  CREATININE 1.41*  CALCIUM 8.1*   ------------------------------------------------------------------------------------------------------------------  Cardiac Enzymes  Recent Labs Lab 08/07/15 2348 08/08/15 0601  TROPONINI 0.11* 1.09*   ------------------------------------------------------------------------------------------------------------------  RADIOLOGY:  Dg Chest Portable 1 View  08/08/2015   CLINICAL DATA:  Shortness of breath and tachycardia. Hyperglycemia tonight.  EXAM: PORTABLE CHEST - 1 VIEW  COMPARISON:  None.  FINDINGS: Shallow inspiration. The heart size and mediastinal contours are within normal limits. Both lungs are clear. The visualized skeletal structures are unremarkable.  IMPRESSION: No active disease.   Electronically Signed   By: Burman Nieves M.D.   On: 08/08/2015 00:19     ASSESSMENT AND PLAN:   59 year old female with type 2 diabetes and pulmonary emboli who presented with sepsis.  1.severe sepsis: Patient was admitted with tachycardia and fever due to urinary tract infection. Cultures are pending at  this time. Continue ceftriaxone and IV fluid.   2. Hypoxic respiratory failure,  acute: Unclear etiology at this time. Chest x-ray shows no acute infiltrate. Patient denies cough or symptoms of pneumonia. I will order a VQ scan to evaluate for pulmonary emboli as well as Lotrimin Doppler. Patient had been off of her Eliquis for a few days which increases her risk of PE/DVT.  3. Elevated troponin: Cardiology has been consulted. Echocardiogram has been ordered. For now patient will continue on Eliquis. If there are wall motion abnormalities then patient may need heparin drip and further cardiac workup. Continue aspirin, Crestor and metoprolol. Continue Eliquis  4. Uncontrolled diabetes: Patient's currently insulin drip. I a repeat BMP pending which I will follow up on. Blood sugars still elevated. Continue hyperglycemic protocol. Continue IV fluids.  5. Essential hypertension: Continue Diovan, metoprolol and HCTZ.  6. OSA: Patient has CPAP that she uses at night.       Management plans discussed with the patient and she is in agreement.  CODE STATUS: FULL  CRITICAL CARE TOTAL TIME TAKING CARE OF THIS PATIENT: 30 minutes.     POSSIBLE D/C 2-3 days, DEPENDING ON CLINICAL CONDITION.   Brittinee Risk M.D on 08/08/2015 at 10:30 AM  Between 7am to 6pm - Pager - [phone] After 6pm go to [email or web address] - password [email or web address] [facility]  [facility]  Office  [phone]  CC: Primary care physician; Wynona Dove, MD

## 2015-08-08 NOTE — ED Notes (Signed)
Blood Glucose was 427 per finger stick.

## 2015-08-08 NOTE — ED Provider Notes (Addendum)
[facility] Emergency Department Provider Note  ____________________________________________  Time seen: 11:55 PM  I have reviewed the triage vital signs and the nursing notes.   HISTORY  Chief Complaint Hyperglycemia     HPI Sonya Sanchez is a 59 y.o. female presents with hyperglycemia glucose reading high. Patient admits to driving down from [place] and has not taken her anti-hyperglycemics in the past 2 days however she did take 65 units of Levemir and 21 units of Humalog before presenting to the emergency department. Patient admits to increased thirst and increased urination as well. She denies any recent illness no fever that she is aware of. Patient does admit to slight dysuria with urination. She also admits to dyspnea room air sat 74% upon EMS arrival which increased to 96% with a nonrebreather.  Past Medical History  Diagnosis Date  . Heart murmur   . Diabetes mellitus without complication   . Urinary tract infection, site not specified   . Need for prophylactic vaccination and inoculation against influenza   . Gout, unspecified   . Calculus of kidney   . Unspecified sleep apnea   . Type II or unspecified type diabetes mellitus without mention of complication, uncontrolled   . Morbid obesity   . Phlebitis and thrombophlebitis of other deep vessels of lower extremities   . Other pulmonary embolism and infarction   . Phlebitis and thrombophlebitis of other deep vessels of lower extremities   . Spinal stenosis, unspecified region other than cervical   . Hyperlipidemia   . Hypertension   . Normal cardiac stress test     [place]    Patient Active Problem List   Diagnosis Date Noted  . Aortic valve stenosis 01/30/2014  . Chronic venous insufficiency 08/03/2013  . Pulmonary embolism 03/21/2013  . DVT (deep venous thrombosis) 03/21/2013  . Encounter for anticoagulation discussion and counseling 03/21/2013  . Hyperlipidemia 02/13/2013  . Severe obesity  (BMI >= 40) 02/13/2013  . HTN (hypertension) 12/25/2012  . Heart murmur 12/25/2012  . SOB (shortness of breath) 12/25/2012  . Diabetes 12/25/2012  . Encounter for preventive health examination 12/25/2012    Past Surgical History  Procedure Laterality Date  . Abdominal hysterectomy      hyperplasia of endometrium  . Stomach surgery    . Intestinal bypass      ovarian cyst ruptured, led to perforated intestine  . Appendectomy    . Cholecystectomy    . Ankle surgery      fracture s/p pin, right ankle  . Colectomy      temporary colostomy, now reversed    Current Outpatient Rx  Name  Route  Sig  Dispense  Refill  . allopurinol (ZYLOPRIM) 300 MG tablet   Oral   Take 300 mg by mouth daily.         Marland Kitchen apixaban (ELIQUIS) 5 MG TABS tablet   Oral   Take 5 mg by mouth 2 (two) times daily.         Marland Kitchen BYDUREON 2 MG PEN      2 mg once a week.       3     Dispense as written.   . Cholecalciferol (VITAMIN D) 1000 UNITS capsule   Oral   Take 2,000 Units by mouth daily.         Marland Kitchen esomeprazole (NEXIUM) 40 MG capsule   Oral   Take 40 mg by mouth daily before breakfast.         .  fenofibrate 54 MG tablet   Oral   Take 54 mg by mouth daily.         . furosemide (LASIX) 20 MG tablet   Oral   Take 20 mg by mouth daily.          . insulin detemir (LEVEMIR) 100 UNIT/ML injection      60units qam and 25units at bedtime   10 mL   6   . insulin lispro (HUMALOG KWIKPEN) 100 UNIT/ML injection   Subcutaneous   Inject 5 Units into the skin 3 (three) times daily before meals.   10 mL   6   . Multiple Vitamin (MULTIVITAMIN) tablet   Oral   Take 1 tablet by mouth daily.         . rosuvastatin (CRESTOR) 10 MG tablet   Oral   Take 10 mg by mouth daily.         . sitaGLIPtan-metformin (JANUMET) 50-1000 MG per tablet   Oral   Take 1 tablet by mouth daily.         . valsartan-hydrochlorothiazide (DIOVAN-HCT) 160-12.5 MG per tablet   Oral   Take 1 tablet by  mouth daily.           Allergies Sulfa antibiotics  Family History  Problem Relation Age of Onset  . Heart attack Mother 56  . Cancer Mother     breast  . Heart attack Father 73  . Arthritis Sister     Social History Social History  Substance Use Topics  . Smoking status: Former Smoker -- 1.00 packs/day for 10 years    Quit date: 12/26/1999  . Smokeless tobacco: Not on file  . Alcohol Use: No    Review of Systems  Constitutional: Negative for fever. Eyes: Negative for visual changes. ENT: Negative for sore throat. Cardiovascular: Negative for chest pain. Respiratory: Negative for shortness of breath. Gastrointestinal: Negative for abdominal pain, vomiting and diarrhea. Genitourinary: Negative for dysuria. Musculoskeletal: Negative for back pain. Skin: Negative for rash. Neurological: Negative for headaches, focal weakness or numbness.   10-point ROS otherwise negative.  ____________________________________________   PHYSICAL EXAM:  VITAL SIGNS: ED Triage Vitals  Enc Vitals Group     BP 08/07/15 2328 163/78 mmHg     Pulse Rate 08/07/15 2328 135     Resp 08/07/15 2328 20     Temp 08/07/15 2357 102.8 F (39.3 C)     Temp Source 08/07/15 2357 Axillary     SpO2 08/07/15 2328 86 %     Weight 08/07/15 2328 305 lb (138.347 kg)     Height 08/07/15 2328 5\' 2"  (1.575 m)     Head Cir --      Peak Flow --      Pain Score --      Pain Loc --      Pain Edu? --      Excl. in GC? --      Constitutional: Alert and oriented. Well appearing and in no distress. Eyes: Conjunctivae are normal. PERRL. Normal extraocular movements. ENT   Head: Normocephalic and atraumatic.   Nose: No congestion/rhinnorhea.   Mouth/Throat: Mucous membranes are moist.   Neck: No stridor. Hematological/Lymphatic/Immunilogical: No cervical lymphadenopathy. Cardiovascular: Normal rate, regular rhythm. Normal and symmetric distal pulses are present in all extremities. No  murmurs, rubs, or gallops. Respiratory: Tachypnea bibasilar rhonchi increased work of breathing. Gastrointestinal: Soft and nontender. No distention. There is no CVA tenderness. Genitourinary: deferred Musculoskeletal: Nontender with normal range of  motion in all extremities. No joint effusions.  No lower extremity tenderness nor edema. Neurologic:  Normal speech and language. No gross focal neurologic deficits are appreciated. Speech is normal.  Skin:  Skin is warm, dry and intact. No rash noted. Psychiatric: Mood and affect are normal. Speech and behavior are normal. Patient exhibits appropriate insight and judgment.  ____________________________________________    LABS (pertinent positives/negatives)  Labs Reviewed  CBC - Abnormal; Notable for the following:    RDW 15.1 (*)    Platelets 148 (*)    All other components within normal limits  BASIC METABOLIC PANEL - Abnormal; Notable for the following:    Sodium 130 (*)    Chloride 95 (*)    Glucose, Bld 699 (*)    BUN 39 (*)    Creatinine, Ser 1.82 (*)    GFR calc non Af Amer 29 (*)    GFR calc Af Amer 33 (*)    All other components within normal limits  TROPONIN I - Abnormal; Notable for the following:    Troponin I 0.11 (*)    All other components within normal limits  BLOOD GAS, ARTERIAL - Abnormal; Notable for the following:    pO2, Arterial 63 (*)    Allens test (pass/fail) POSITIVE (*)    All other components within normal limits  URINALYSIS COMPLETEWITH MICROSCOPIC ([facility] ONLY) - Abnormal; Notable for the following:    Color, Urine YELLOW (*)    APPearance HAZY (*)    Glucose, UA >500 (*)    Ketones, ur TRACE (*)    Hgb urine dipstick 3+ (*)    Protein, ur >500 (*)    Leukocytes, UA TRACE (*)    Squamous Epithelial / LPF 0-5 (*)    All other components within normal limits  LACTIC ACID, PLASMA - Abnormal; Notable for the following:    Lactic Acid, Venous 4.0 (*)    All other components within normal limits   CULTURE, BLOOD (SINGLE)  CULTURE, BLOOD (SINGLE)  LACTIC ACID, PLASMA  CBC  CREATININE, SERUM  BASIC METABOLIC PANEL  CBC  HEMOGLOBIN A1C     ____________________________________________   EKG  ED ECG REPORT I, Ezella Kell, Lometa N, the attending physician, personally viewed and interpreted this ECG.   Date: 08/08/2015  EKG Time: 12:08 AM  Rate: 127  Rhythm: Sinus tachycardia  Axis: None  Intervals: Normal  ST&T Change: None   ____________________________________________    RADIOLOGY    DG Chest Portable 1 View (Final result) Result time: 08/08/15 00:19:35   Final result by Rad Results In Interface (08/08/15 00:19:35)   Narrative:   CLINICAL DATA: Shortness of breath and tachycardia. Hyperglycemia tonight.  EXAM: PORTABLE CHEST - 1 VIEW  COMPARISON: None.  FINDINGS: Shallow inspiration. The heart size and mediastinal contours are within normal limits. Both lungs are clear. The visualized skeletal structures are unremarkable.  IMPRESSION: No active disease.   Electronically Signed By: Burman Nieves M.D. On: 08/08/2015 00:19        ECG Results    Critical care:CRITICAL CARE Performed by: Bayard Males N   Total critical care time: 60 minutes  Critical care time was exclusive of separately billable procedures and treating other patients.  Critical care was necessary to treat or prevent imminent or life-threatening deterioration.  Critical care was time spent personally by me on the following activities: development of treatment plan with patient and/or surrogate as well as nursing, discussions with consultants, evaluation of patient's response to treatment, examination of patient,  obtaining history from patient or surrogate, ordering and performing treatments and interventions, ordering and review of laboratory studies, ordering and review of radiographic studies, pulse oximetry and re-evaluation of patient's  condition.    INITIAL IMPRESSION / ASSESSMENT AND PLAN / ED COURSE  Pertinent labs & imaging results that were available during my care of the patient were reviewed by me and considered in my medical decision making (see chart for details).  She received IV Levaquin on presentation as well as 30 MLS per kilogram of normal saline History of physical exam consistent with hyperglycemia most secondary to medication noncompliance as well as sepsis. Patient sepsis most likely secondary from acute cystitis. Given patient's history of a DVT and rest or distress with hypoxia and presentation we'll obtain a CT scan of the chest to rule out PE. Patient will be admitted to hospitalist for further evaluation and treatment  ____________________________________________   FINAL CLINICAL IMPRESSION(S) / ED DIAGNOSES  Final diagnoses:  Sepsis, due to unspecified organism  Hyperglycemia  Acute cystitis without hematuria      Darci Current, MD 08/08/15 0154  Darci Current, MD 08/08/15 [id number]

## 2015-08-08 NOTE — Progress Notes (Signed)
Patient returned from procedures.

## 2015-08-08 NOTE — Progress Notes (Signed)
Patient off unit with monitoring to nuclear medicine for a VQ scan and ultrasound for bilateral  Leg dopplers

## 2015-08-08 NOTE — ED Notes (Signed)
Admitting MD at bedside.

## 2015-08-08 NOTE — H&P (Signed)
[facility] - [place] at [facility]   PATIENT NAME: Sonya Sanchez    MR#:  [id number]  DATE OF BIRTH:  [birth year]   DATE OF ADMISSION:  08/07/2015  PRIMARY CARE PHYSICIAN: Wynona Dove, MD   REQUESTING/REFERRING PHYSICIAN: Manson Passey  CHIEF COMPLAINT:   Chief Complaint  Patient presents with  . Hyperglycemia   short of breath  HISTORY OF PRESENT ILLNESS:  Sonya Sanchez  is a 59 y.o. female with a known history of type 2 diabetes insulin requiring, presenting with elevated glucose, shortness of breath. She recently [place] about 4 days ago. She has not taken any of her medications and that time. Presenting with markedly elevated blood sugars at home as well as shortness of breath. She's been describing shortness of breath last 2 days total duration with associated subjective fever and chills. She had shaking chills today of at the request of her sister came to the hospital for further workup and evaluation. She describes having increased thirst as well as increased urination denies further symptomatology at this time  PAST MEDICAL HISTORY:   Past Medical History  Diagnosis Date  . Heart murmur   . Diabetes mellitus without complication   . Urinary tract infection, site not specified   . Need for prophylactic vaccination and inoculation against influenza   . Gout, unspecified   . Calculus of kidney   . Unspecified sleep apnea   . Type II or unspecified type diabetes mellitus without mention of complication, uncontrolled   . Morbid obesity   . Phlebitis and thrombophlebitis of other deep vessels of lower extremities   . Other pulmonary embolism and infarction   . Phlebitis and thrombophlebitis of other deep vessels of lower extremities   . Spinal stenosis, unspecified region other than cervical   . Hyperlipidemia   . Hypertension   . Normal cardiac stress test     [place]    PAST SURGICAL HISTORY:   Past Surgical History   Procedure Laterality Date  . Abdominal hysterectomy      hyperplasia of endometrium  . Stomach surgery    . Intestinal bypass      ovarian cyst ruptured, led to perforated intestine  . Appendectomy    . Cholecystectomy    . Ankle surgery      fracture s/p pin, right ankle  . Colectomy      temporary colostomy, now reversed    SOCIAL HISTORY:   Social History  Substance Use Topics  . Smoking status: Former Smoker -- 1.00 packs/day for 10 years    Quit date: 12/26/1999  . Smokeless tobacco: Not on file  . Alcohol Use: No    FAMILY HISTORY:   Family History  Problem Relation Age of Onset  . Heart attack Mother 25  . Cancer Mother     breast  . Heart attack Father 59  . Arthritis Sister     DRUG ALLERGIES:   Allergies  Allergen Reactions  . Sulfa Antibiotics Other (See Comments)    Reaction: isn't certain, thinks she ran a fever, or had a rash, maybe both.    REVIEW OF SYSTEMS:  REVIEW OF SYSTEMS:  CONSTITUTIONAL: Positive fevers, chills, fatigue, weakness.  EYES: Denies blurred vision, double vision, or eye pain.  EARS, NOSE, THROAT: Denies tinnitus, ear pain, hearing loss.  RESPIRATORY: denies cough, positive shortness of breath, denies wheezing  CARDIOVASCULAR: Denies chest pain, palpitations, positive edema.  GASTROINTESTINAL: Denies nausea, vomiting, diarrhea, abdominal pain.  GENITOURINARY: Positive dysuria, denies hematuria.  ENDOCRINE: Denies nocturia or thyroid problems. HEMATOLOGIC AND LYMPHATIC: Denies easy bruising or bleeding.  SKIN: Denies rash or lesions.  MUSCULOSKELETAL: Denies pain in neck, back, shoulder, knees, hips, or further arthritic symptoms.  NEUROLOGIC: Denies paralysis, paresthesias.  PSYCHIATRIC: Denies anxiety or depressive symptoms. Otherwise full review of systems performed by me is negative.   MEDICATIONS AT HOME:   Prior to Admission medications   Medication Sig Start Date End Date Taking? Authorizing Provider   allopurinol (ZYLOPRIM) 300 MG tablet Take 300 mg by mouth daily.   Yes Historical Provider, MD  apixaban (ELIQUIS) 5 MG TABS tablet Take 5 mg by mouth 2 (two) times daily.   Yes Historical Provider, MD  aspirin 81 MG chewable tablet Chew 81 mg by mouth daily.   Yes Historical Provider, MD  BYDUREON 2 MG PEN 2 mg once a week.  11/17/14  Yes Historical Provider, MD  Cholecalciferol (VITAMIN D) 1000 UNITS capsule Take 2,000 Units by mouth daily.   Yes Historical Provider, MD  esomeprazole (NEXIUM) 40 MG capsule Take 40 mg by mouth daily before breakfast.   Yes Historical Provider, MD  fenofibrate 54 MG tablet Take 54 mg by mouth daily.   Yes Historical Provider, MD  furosemide (LASIX) 20 MG tablet Take 20 mg by mouth daily.  01/14/15  Yes Historical Provider, MD  insulin detemir (LEVEMIR) 100 UNIT/ML injection 60units qam and 25units at bedtime 02/13/13  Yes Shelia Media, MD  insulin lispro (HUMALOG KWIKPEN) 100 UNIT/ML injection Inject 5 Units into the skin 3 (three) times daily before meals. 03/02/13  Yes Shelia Media, MD  metoprolol succinate (TOPROL-XL) 25 MG 24 hr tablet Take 25 mg by mouth daily.   Yes Historical Provider, MD  Multiple Vitamin (MULTIVITAMIN) tablet Take 1 tablet by mouth daily.   Yes Historical Provider, MD  rosuvastatin (CRESTOR) 10 MG tablet Take 10 mg by mouth daily.   Yes Historical Provider, MD  sitaGLIPtan-metformin (JANUMET) 50-1000 MG per tablet Take 1 tablet by mouth daily.   Yes Historical Provider, MD  valsartan-hydrochlorothiazide (DIOVAN-HCT) 160-12.5 MG per tablet Take 1 tablet by mouth daily.   Yes Historical Provider, MD      VITAL SIGNS:  Blood pressure 135/73, pulse 123, temperature 102.8 F (39.3 C), temperature source Axillary, resp. rate 16, height 5\' 2"  (1.575 m), weight 305 lb (138.347 kg), SpO2 90 %.  PHYSICAL EXAMINATION:  VITAL SIGNS: Filed Vitals:   08/08/15 0135  BP: 135/73  Pulse: 123  Temp:   Resp: 16   GENERAL:59 y.o.female  currently in moderate acute distress.  HEAD: Normocephalic, atraumatic.  EYES: Pupils equal, round, reactive to light. Extraocular muscles intact. No scleral icterus.  MOUTH: Moist mucosal membrane. Dentition intact. No abscess noted.  EAR, NOSE, THROAT: Clear without exudates. No external lesions.  NECK: Supple. No thyromegaly. No nodules. No JVD.  PULMONARY: Clear to ascultation, without wheeze rails or rhonci. No use of accessory muscles, Good respiratory effort. good air entry bilaterally CHEST: Nontender to palpation.  CARDIOVASCULAR: S1 and S2. Tachycardic. No murmurs, rubs, or gallops. 1+ edema bilateral. Pedal pulses 2+ bilaterally.  GASTROINTESTINAL: Soft, nontender, nondistended. No masses. Positive bowel sounds. No hepatosplenomegaly.  MUSCULOSKELETAL: No swelling, clubbing, or edema. Range of motion full in all extremities.  NEUROLOGIC: Cranial nerves II through XII are intact. No gross focal neurological deficits. Sensation intact. Reflexes intact.  SKIN: Skin changes consistent with chronic venous stasis, No ulceration, lesions, rashes, or cyanosis. Skin warm and  dry. Turgor intact.  PSYCHIATRIC: Mood, affect within normal limits. The patient is awake, alert and oriented x 3. Insight, judgment intact.    LABORATORY PANEL:   CBC  Recent Labs Lab 08/07/15 2348  WBC 7.0  HGB 13.4  HCT 41.5  PLT 148*   ------------------------------------------------------------------------------------------------------------------  Chemistries   Recent Labs Lab 08/07/15 2348  NA 130*  K 4.2  CL 95*  CO2 23  GLUCOSE 699*  BUN 39*  CREATININE 1.82*  CALCIUM 9.0   ------------------------------------------------------------------------------------------------------------------  Cardiac Enzymes  Recent Labs Lab 08/07/15 2348  TROPONINI 0.11*   ------------------------------------------------------------------------------------------------------------------  RADIOLOGY:   Dg Chest Portable 1 View  08/08/2015   CLINICAL DATA:  Shortness of breath and tachycardia. Hyperglycemia tonight.  EXAM: PORTABLE CHEST - 1 VIEW  COMPARISON:  None.  FINDINGS: Shallow inspiration. The heart size and mediastinal contours are within normal limits. Both lungs are clear. The visualized skeletal structures are unremarkable.  IMPRESSION: No active disease.   Electronically Signed   By: Burman Nieves M.D.   On: 08/08/2015 00:19    EKG:   Orders placed or performed during the hospital encounter of 08/07/15  . ED EKG  . ED EKG  . EKG 12-Lead  . EKG 12-Lead    IMPRESSION AND PLAN:   59 year old Caucasian female history type 2 diabetes presenting with markedly elevated glucose fever, chills.  1. Severe Sepsis, meeting septic criteria by tachycardia, temperature present on arrival. Source urinary, UTI, Panculture. Broad-spectrum antibiotics including ceftriaxone and taper antibiotics when culture data returns. Continue IV fluid hydration to keep mean arterial pressure greater than 65. He may require pressor therapy if blood pressure worsens. We will repeat lactic acid given the initial is greater than 2.2.  2. Type 2 diabetes uncontrolled: We'll initiate insulin drip given glucose was greater than 600, hold oral agents other anti-diabetic medicines 3. Elevated troponin: Telemetry, trend cardiac enzymes 4. Obstructive sleep apnea: CPAP therapy at night 5. Venous thromboembolism prophylactic: Heparin subcutaneous    All the records are reviewed and case discussed with ED provider. Management plans discussed with the patient, family and they are in agreement.  CODE STATUS: Full  TOTAL TIME TAKING CARE OF THIS PATIENT: 45 critical minutes.    Hower,  Mardi Mainland.D on 08/08/2015 at 2:14 AM  Between 7am to 6pm - Pager - [phone]  After 6pm: House Pager: - [phone]  [facility]  Office  [phone]  CC: Primary care physician; Wynona Dove,  MD

## 2015-08-08 NOTE — ED Notes (Signed)
Pt up to ambulate to restroom. Pt became very SOB and heart rate increased to 150bpm. Pt reports leg pain with movement. Pt also needed the assistance of 2 RNs to stand and ambulate.

## 2015-08-08 NOTE — Progress Notes (Signed)
ANTIBIOTIC CONSULT NOTE - INITIAL  Pharmacy Consult for ceftriaxone dosing Indication: sepsis  Allergies  Allergen Reactions  . Sulfa Antibiotics Other (See Comments)    Reaction: isn't certain, thinks she ran a fever, or had a rash, maybe both.    Patient Measurements: Height:  (157.5 cm) Weight: (!) 305 lb (138.347 kg) IBW/kg (Calculated) : 50.1 Adjusted Body Weight: n/a  Vital Signs: Temp: 98.5 F (36.9 C) (09/09 0251) Temp Source: Oral (09/09 0251) BP: 122/72 mmHg (09/09 0300) Pulse Rate: 102 (09/09 0300) Intake/Output from previous day:   Intake/Output from this shift:    Labs:  Recent Labs  08/07/15 2348  WBC 7.0  HGB 13.4  PLT 148*  CREATININE 1.82*   Estimated Creatinine Clearance: 43.8 mL/min (by C-G formula based on Cr of 1.82). No results for input(s): VANCOTROUGH, VANCOPEAK, VANCORANDOM, GENTTROUGH, GENTPEAK, GENTRANDOM, TOBRATROUGH, TOBRAPEAK, TOBRARND, AMIKACINPEAK, AMIKACINTROU, AMIKACIN in the last 72 hours.   Microbiology: No results found for this or any previous visit (from the past 720 hour(s)).  Medical History: Past Medical History  Diagnosis Date  . Heart murmur   . Diabetes mellitus without complication   . Urinary tract infection, site not specified   . Need for prophylactic vaccination and inoculation against influenza   . Gout, unspecified   . Calculus of kidney   . Unspecified sleep apnea   . Type II or unspecified type diabetes mellitus without mention of complication, uncontrolled   . Morbid obesity   . Phlebitis and thrombophlebitis of other deep vessels of lower extremities   . Other pulmonary embolism and infarction   . Phlebitis and thrombophlebitis of other deep vessels of lower extremities   . Spinal stenosis, unspecified region other than cervical   . Hyperlipidemia   . Hypertension   . Normal cardiac stress test     [place]    Medications:   Assessment: UA: LE(tr)  NO2(-) WBC TNTC Blood cx pending  Goal of  Therapy:  Resolve infection  Plan:  Ceftriaxone 2 grams q 24 hours ordered.    Fulton Reek, PharmD, BCPS  08/08/2015

## 2015-08-08 NOTE — Consult Note (Addendum)
Cardiology Consultation Note  Patient ID: Sonya Sanchez, MRN: [id number], DOB/AGE: September 22, 1956 59 y.o. Admit date: 08/07/2015   Date of Consult: 08/08/2015 Primary Physician: Wynona Dove, MD Primary Cardiologist: Dr. Mariah Milling, MD  Chief Complaint: SOB, palpitations, jitteriness s/p drive from [place] in setting of off all medications x 4 days  Reason for Consult: Elevated troponin   HPI: 59 y.o. 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 She was found to be tachycardiac, tachpneic, hyperglycemic, have a alctic acid of 4.0, and a troponin of 1.09.  Cardiology is consulted for furhter evaluation of her troponin elevation.   She lives in [place], [place] in the winter and in [place] for the rest of the year. She was originally diagnosed with DVT/PE in 2006 in [place] and started on warfarin. She was eventually transitioned to Eliquis by physician in [place]. She reported her PE felt like "bronchitis." She had an ultrasound in 2010 for possible cellulitis, no DVT was seen at that time. Last echo on 01/23/2013 showed EF 55-60%, normal wall motion, GR1DD, and mild AS. She has poorly controlled diabetes with sugars roughly in the 30 range. No regular exercise program.   She was recently up in [place] and had a Lexiscan Myoview on 07/09/2015. No notes in Care EveryWhere to explain why this was done. Results indicate equivocal study, significant soft tissue artifact present, no chest discomfort or ECG changes, perfusion images suggest moderate sized region of mild reversible perfusion defect. Findings may be 2/2 shifting soft tissue attenuation, but cannot rule out ischemia, EF 72%.   On her way back down  from [place] she had stopped all of her medications for 4 days, including her Eliquis and insulin. She presented to [facility] on 9/8 with SOB, palpitations, and jitteriness. EMS found her to be hypoxic requiring NRB with O@ sats at 96%. Her blood sugar was 699 upon arrival. This has since improved to date at 209. Troponin was 0.11-->1.09-->1.06. Lactic acid was 4.0. CXR was negative. UA showed trace LE, TNTC RBC, TNTC WBC, negative nitrite. She received IV Levaquin in the ED along with hypoglycemic measures. Blood cultures are pending. CTA of the chest was planned but canceled given her SCr of 1.4. She has been restarted on her Eliquis at 5 mg bid. This morning she does feel better now that her blood sugar is improved.    Past Medical History  Diagnosis Date  . Heart murmur   . Diabetes mellitus without complication   . Urinary tract infection, site not specified   . Gout, unspecified   . Calculus of kidney   . Unspecified sleep apnea   . Type II or unspecified type diabetes mellitus without mention of complication, uncontrolled   . Morbid obesity   . Phlebitis and thrombophlebitis of other deep vessels of lower extremities   . Pulmonary emboli     a. on Eliquis  . Phlebitis and thrombophlebitis of other deep vessels of lower extremities   . Spinal stenosis, unspecified region other than cervical   . Hyperlipidemia   . Hypertension   . Normal cardiac stress test     a. equivocal study, sig soft tissue artifact present, no chest discomfort or ECG changes,  perfusion images suggest mod sized region of mild reversible perfusion defect. Findings may be 2/2 shifting soft tissue attenuation, but cannot r/o ischemia, EF 72%  . DVT (deep venous thrombosis)     a. on Eliquis  . Family history of early CAD     a. parents passing in their 22's from CAD  . Tobacco abuse       Most Recent Cardiac Studies: Lexiscan Myoview 07/09/2015: [facility], [place]  Findings Quality: Technically difficult. There  is a moderate degree of both breast attenuation and diaphragmatic attenuation artifact noted on rotating planar images. Tomographic images reveal a moderate sized region of mild reversible perfusion in the inferolateral wall. This may be attributable to shifting soft tissue attenuation artifact, but cannot rule out ischemia. Remaining segments are normal. Quantitative analysis confirms the defect, with total burden of ischemia at 10%. Gated images reveal normal LV size and function, EF 72%. TID is normal.  Assisted by: Lorne Skeens, NP.  Interpretation Summary Equivocal study. Significant soft tissue artifact is present. No chest discomfort or ECG changes of ischemia after Regadeneson stress . Perfusion images suggest a moderate sized region of mild reversible perfusion. Findings may be due to shifting soft tissue attenuation, but cannot rule out ischemia. Normal LV size and function, EF 72%.    Echo 01/23/2013:  Study Conclusions  - Left ventricle: The cavity size was normal. There was mild concentric hypertrophy. Systolic function was normal. The estimated ejection fraction was in the range of 55% to 60%. Wall motion was normal; there were no regional wall motion abnormalities. Doppler parameters are consistent with abnormal left ventricular relaxation (grade 1 diastolic dysfunction). - Aortic valve: There was very mild stenosis. The valve was not well visualized though. Mean gradient: 11mm Hg (S). Valve area: 1.9cm^2(VTI).   Surgical History:  Past Surgical History  Procedure Laterality Date  . Abdominal hysterectomy      hyperplasia of endometrium  . Stomach surgery    . Intestinal bypass      ovarian cyst ruptured, led to perforated intestine  . Appendectomy    . Cholecystectomy    . Ankle surgery      fracture s/p pin, right ankle  . Colectomy      temporary colostomy, now reversed     Home Meds: Prior to Admission medications   Medication  Sig Start Date End Date Taking? Authorizing Provider  allopurinol (ZYLOPRIM) 300 MG tablet Take 300 mg by mouth daily.   Yes Historical Provider, MD  apixaban (ELIQUIS) 5 MG TABS tablet Take 5 mg by mouth 2 (two) times daily.   Yes Historical Provider, MD  aspirin 81 MG chewable tablet Chew 81 mg by mouth daily.   Yes Historical Provider, MD  BYDUREON 2 MG PEN 2 mg once a week.  11/17/14  Yes Historical Provider, MD  Cholecalciferol (VITAMIN D) 1000 UNITS capsule Take 2,000 Units by mouth daily.   Yes Historical Provider, MD  esomeprazole (NEXIUM) 40 MG capsule Take 40 mg by mouth daily before breakfast.   Yes Historical Provider, MD  fenofibrate 54 MG tablet Take 54 mg by mouth daily.   Yes Historical Provider, MD  furosemide (LASIX) 20 MG tablet Take 20 mg by mouth daily.  01/14/15  Yes Historical Provider, MD  insulin detemir (LEVEMIR) 100 UNIT/ML injection 60units qam and 25units at bedtime 02/13/13  Yes Shelia Media, MD  insulin lispro (HUMALOG KWIKPEN) 100 UNIT/ML injection Inject 5 Units into the skin 3 (three) times daily before  meals. 03/02/13  Yes Shelia Media, MD  metoprolol succinate (TOPROL-XL) 25 MG 24 hr tablet Take 25 mg by mouth daily.   Yes Historical Provider, MD  Multiple Vitamin (MULTIVITAMIN) tablet Take 1 tablet by mouth daily.   Yes Historical Provider, MD  rosuvastatin (CRESTOR) 10 MG tablet Take 10 mg by mouth daily.   Yes Historical Provider, MD  sitaGLIPtan-metformin (JANUMET) 50-1000 MG per tablet Take 1 tablet by mouth daily.   Yes Historical Provider, MD  valsartan-hydrochlorothiazide (DIOVAN-HCT) 160-12.5 MG per tablet Take 1 tablet by mouth daily.   Yes Historical Provider, MD    Inpatient Medications:  . allopurinol  300 mg Oral Daily  . apixaban  5 mg Oral BID  . aspirin  81 mg Oral Daily  . [START ON 08/09/2015] cefTRIAXone (ROCEPHIN) IVPB 2 gram/50 mL D5W (Pyxis)  2 g Intravenous Q24H  . cholecalciferol  2,000 Units Oral Daily  . cefTRIAXone  (ROCEPHIN) IVPB 1 gram/50 mL D5W      . cefTRIAXone (ROCEPHIN) IVPB 1 gram/50 mL D5W      . fenofibrate  54 mg Oral Daily  . valsartan  160 mg Oral Daily   And  . hydrochlorothiazide  12.5 mg Oral Daily  . metoprolol succinate  25 mg Oral Daily  . multivitamin with minerals  1 tablet Oral Daily  . pantoprazole  40 mg Oral Daily  . rosuvastatin  10 mg Oral Daily  . sodium chloride  3 mL Intravenous Q12H   . sodium chloride 125 mL/hr at 08/08/15 0228  . insulin (NOVOLIN-R) infusion 7.5 Units/hr (08/08/15 0919)    Allergies:  Allergies  Allergen Reactions  . Sulfa Antibiotics Other (See Comments)    Reaction: isn't certain, thinks she ran a fever, or had a rash, maybe both.    Social History   Social History  . Marital Status: Single    Spouse Name: N/A  . Number of Children: N/A  . Years of Education: N/A   Occupational History  . Not on file.   Social History Main Topics  . Smoking status: Former Smoker -- 1.00 packs/day for 10 years    Quit date: 12/26/1999  . Smokeless tobacco: Not on file  . Alcohol Use: No  . Drug Use: No  . Sexual Activity: Not on file   Other Topics Concern  . Not on file   Social History Narrative   Lives in [place], [place] summer, [place] Dec-Mar.  Sister and children live here in [place]      Work - Retired Designer, television/film set      Diet - regular      Exercise - walks, limited by knee and ankle pain     Family History  Problem Relation Age of Onset  . Heart attack Mother 59  . Cancer Mother     breast  . Heart attack Father 15  . Arthritis Sister      Review of Systems: Review of Systems  Constitutional: Positive for malaise/fatigue and diaphoresis. Negative for fever, chills and weight loss.  HENT: Negative for congestion.   Eyes: Negative for discharge and redness.  Respiratory: Positive for cough and shortness of breath. Negative for hemoptysis, sputum production and wheezing.   Cardiovascular: Positive for palpitations and  leg swelling. Negative for chest pain, orthopnea, claudication and PND.  Gastrointestinal: Negative for heartburn, nausea, vomiting, abdominal pain, blood in stool and melena.  Genitourinary: Negative for hematuria.  Musculoskeletal: Negative for myalgias and falls.  Skin: Negative for rash.  Neurological: Positive for weakness. Negative for dizziness, sensory change, speech change and focal weakness.  Endo/Heme/Allergies: Does not bruise/bleed easily.  Psychiatric/Behavioral: The patient is not nervous/anxious.   All other systems reviewed and are negative.   Labs:  Recent Labs  08/07/15 2348 08/08/15 0601  TROPONINI 0.11* 1.09*   Lab Results  Component Value Date   WBC 10.1 08/08/2015   HGB 11.9* 08/08/2015   HCT 37.1 08/08/2015   MCV 82.6 08/08/2015   PLT 132* 08/08/2015     Recent Labs Lab 08/08/15 0601  NA 134*  K 3.8  CL 105  CO2 21*  BUN 37*  CREATININE 1.41*  CALCIUM 8.1*  GLUCOSE 409*   Lab Results  Component Value Date   CHOL 167 08/03/2013   HDL 30.30* 08/03/2013   LDLCALC 104* 08/03/2013   TRIG 162.0* 08/03/2013   No results found for: DDIMER  Radiology/Studies:  Dg Chest Portable 1 View  08/08/2015   CLINICAL DATA:  Shortness of breath and tachycardia. Hyperglycemia tonight.  EXAM: PORTABLE CHEST - 1 VIEW  COMPARISON:  None.  FINDINGS: Shallow inspiration. The heart size and mediastinal contours are within normal limits. Both lungs are clear. The visualized skeletal structures are unremarkable.  IMPRESSION: No active disease.   Electronically Signed   By: Burman Nieves M.D.   On: 08/08/2015 00:19    EKG: sinus tachycardia, 127 bpm, inferolateral st depression, cannot rule out septal infarct   Weights: Filed Weights   08/07/15 2328  Weight: 305 lb (138.347 kg)     Physical Exam: Blood pressure 115/81, pulse 86, temperature 97.7 F (36.5 C), temperature source Oral, resp. rate 22, height 5\' 2"  (1.575 m), weight 305 lb (138.347 kg), SpO2 95  %. Body mass index is 55.77 kg/(m^2). General: Well developed, well nourished, in no acute distress. Head: Normocephalic, atraumatic, sclera non-icteric, no xanthomas, nares are without discharge.  Neck: Negative for carotid bruits. JVD not elevated. Lungs: Clear bilaterally to auscultation without wheezes, rales, or rhonchi. Breathing is unlabored. Heart: RRR with S1 S2. No murmurs, rubs, or gallops appreciated. Abdomen: Obese, soft, non-tender, non-distended with normoactive bowel sounds. No hepatomegaly. No rebound/guarding. No obvious abdominal masses. Msk:  Strength and tone appear normal for age. Extremities: No clubbing or cyanosis. No edema. Bilateral lower extremity hyperpigmentation.    Neuro: Alert and oriented X 3. No facial asymmetry. No focal deficit. Moves all extremities spontaneously. Psych:  Responds to questions appropriately with a normal affect.    Assessment and Plan:   1. Elevated troponin: -Likely supply demand ischemia in the setting of possible DVT/PE given she has not taken her Eliquis x 4 days and has been on a long car ride from [place] and is tachycardiac and tachypneic. Elevation could also be seen in her severe sepsis secondary to UTI with a lactic acid of 4.0, as well as a blood glucose of 700 -Check echo to evaluate LV function, wall motion, aortic valve, and right side pressure, if normal no further inpatient ischemic work up planned at this time  2. Possible DVT/PE: -Acute renal injury precludes CTA at this time -VQ scan today to evaluate for PE -Will check lower extremity dopplers to evaluate for acute DVT -Treat presumptively for DVT/PE and restart Eliquis for acute DVT/PE  3. Acute respiratory failure: -Initially requiring BiPAP -Weaned to nasal cannula -Continue to wean as tolerated -In the setting of the above -VQ scan later today  4. Sepsis secondary to UTI: -Meets 3 of 4 SIRS criteria  -  Antibiotics per IM -Lactic acid 4.0, IM to trend    5. IDDM: -Blood glucose 700 upon arrival  -Improving  -Per IM  6. Aortic stenosis: -Check echo as above  7. HTN: -  8. Morbid obesity: -Major issue in the above problems -Recommend outpatient diet management  9. HLD: -Crestor  10. Chronic venous insufficiency: -Doppler as above -Compression hose as outpatient    Signed, Eula Listen, PA-C Pager: [phone] 08/08/2015, 10:20 AM   Attending Note Patient seen and examined, agree with detailed note above,  Patient presentation and plan discussed on rounds.   Obese patient known to me from clinic Medication noncompliance recently with severely elevated glucose levels Clinical symptoms concerning for bacteremia, from urinary tract infection Elevated troponin of unclear significance, likely underlying coronary artery disease given her risk factors, Though suspect demand ischemia in the setting of bacteremia/sepsis, tachycardia  --Given her elevated creatinine, would not proceed with cardiac catheterization at this time She is relatively asymptomatic. Starting to feel better on ABX She reports having recent stress test through our office as well as additional stress tests in [place] that showed no ischemia. Given this, less urgency for catheterization. --She doesn't history of DVT, PE and given she was off her anticoagulation for a period of time, this raises the concern of recurrent PE though she denies shortness of breath at this time. --Would put back on her anticoagulation --We'll order echocardiogram to evaluate right heart pressures, strain We will monitor with you for now. No plan for catheterization at this time. Could potentially be done next week if clinically indicated.  Signed: Dossie Arbour  M.D., Ph.D.

## 2015-08-08 NOTE — Progress Notes (Addendum)
Inpatient Diabetes Program Recommendations  AACE/ADA: New Consensus Statement on Inpatient Glycemic Control (2013)  Target Ranges:  Prepandial:   less than 140 mg/dL      Peak postprandial:   less than 180 mg/dL (1-2 hours)      Critically ill patients:  140 - 180 mg/dL   Reason for glycemic review: insulin infusion  Diabetes history: Type 2 Outpatient Diabetes medications: Bydureon 76m/day, Levemir 60 units qam, 25 units qhs, Humalog 5units tid with meals, Janumet 5/10071m1/day Current orders for Inpatient glycemic control: Phase 2 insulin via GlSyracusehase III (Transition ICU Glycemic Control Order Set) when all of the following are met:  the insulin infusion rate is < 4 units / hour  tube feeds are at goal rate and stable (if applicable)  there are 6 subsequent CBG readings < 180 mg/dL  JuGentry FitzRN, BAIllinoisIndianaMHSouth CarolinaCDE Diabetes Coordinator Inpatient Diabetes Program  [phone] Pager) [phone] Creek9/07/2015 7:56 AM

## 2015-08-08 NOTE — ED Notes (Signed)
This RN attempted to call report to ICU. RN was busy and reports they will call back.

## 2015-08-08 NOTE — Progress Notes (Signed)
Lab reports elevated troponin.  MD informed.

## 2015-08-08 NOTE — ED Notes (Signed)
Pt able to ambulate to the bathroom independently after RN assisted pt to standing position. Pts heart rate elevated to 125 and pt reported SOB but pt did significantly better than first attempt at ambulating top the restroom.

## 2015-08-09 ENCOUNTER — Inpatient Hospital Stay (HOSPITAL_COMMUNITY)
Admit: 2015-08-09 | Discharge: 2015-08-09 | Disposition: A | Discharge status: Home / Self Care | Payer: No Typology Code available for payment source | Attending: Cardiology | Admitting: Cardiology

## 2015-08-09 DIAGNOSIS — R079 Chest pain, unspecified: Secondary | ICD-10-CM

## 2015-08-09 DIAGNOSIS — R6520 Severe sepsis without septic shock: Secondary | ICD-10-CM

## 2015-08-09 LAB — BASIC METABOLIC PANEL
ANION GAP: 7 (ref 5–15)
BUN: 22 mg/dL — ABNORMAL HIGH (ref 6–20)
CO2: 23 mmol/L (ref 22–32)
Calcium: 8.3 mg/dL — ABNORMAL LOW (ref 8.9–10.3)
Chloride: 109 mmol/L (ref 101–111)
Creatinine, Ser: 0.86 mg/dL (ref 0.44–1.00)
GFR calc Af Amer: >60 mL/min (ref >60)
Glucose, Bld: 156 mg/dL — ABNORMAL HIGH (ref 65–99)
POTASSIUM: 3.7 mmol/L (ref 3.5–5.1)
SODIUM: 139 mmol/L (ref 135–145)

## 2015-08-09 LAB — GLUCOSE, CAPILLARY
GLUCOSE-CAPILLARY: 175 mg/dL — AB (ref 65–99)
GLUCOSE-CAPILLARY: 322 mg/dL — AB (ref 65–99)
GLUCOSE-CAPILLARY: 293 mg/dL — AB (ref 65–99)
GLUCOSE-CAPILLARY: 310 mg/dL — AB (ref 65–99)
Glucose-Capillary: 112 mg/dL — ABNORMAL HIGH (ref 65–99)
Glucose-Capillary: 116 mg/dL — ABNORMAL HIGH (ref 65–99)
Glucose-Capillary: 152 mg/dL — ABNORMAL HIGH (ref 65–99)
Glucose-Capillary: 161 mg/dL — ABNORMAL HIGH (ref 65–99)

## 2015-08-09 LAB — CBC
HCT: 34.3 % — ABNORMAL LOW (ref 35.0–47.0)
Hemoglobin: 11.2 g/dL — ABNORMAL LOW (ref 12.0–16.0)
MCH: 26.9 pg (ref 26.0–34.0)
MCHC: 32.5 g/dL (ref 32.0–36.0)
MCV: 82.6 fL (ref 80.0–100.0)
PLATELETS: 122 10*3/uL — AB (ref 150–440)
RBC: 4.15 MIL/uL (ref 3.80–5.20)
RDW: 15.3 % — ABNORMAL HIGH (ref 11.5–14.5)
WBC: 8.7 10*3/uL (ref 3.6–11.0)

## 2015-08-09 NOTE — Progress Notes (Signed)
[facility] - [place] at [facility]   PATIENT NAME: Sonya Sanchez    MR#:  [id number]  DATE OF BIRTH:  Jan 03, 1956  SUBJECTIVE:  Patient and he needs to have some shortness of breath.  REVIEW OF SYSTEMS:    Review of Systems  Constitutional: Negative for fever, chills and malaise/fatigue.  HENT: Negative for sore throat.   Eyes: Negative for blurred vision.  Respiratory: Positive for shortness of breath. Negative for cough, hemoptysis and wheezing.   Cardiovascular: Negative for chest pain, palpitations and leg swelling.  Gastrointestinal: Negative for nausea, vomiting, abdominal pain, diarrhea and blood in stool.  Genitourinary: Negative for dysuria, urgency, frequency, hematuria and flank pain.  Musculoskeletal: Negative for back pain.  Neurological: Negative for dizziness, tremors and headaches.  Endo/Heme/Allergies: Does not bruise/bleed easily.    Tolerating Diet: Yes     DRUG ALLERGIES:   Allergies  Allergen Reactions  . Sulfa Antibiotics Other (See Comments)    Reaction: isn't certain, thinks she ran a fever, or had a rash, maybe both.    VITALS:  Blood pressure 134/51, pulse 107, temperature 98 F (36.7 C), temperature source Oral, resp. rate 22, height  (1.575 m), weight 138.347 kg (305 lb), SpO2 91 %.  PHYSICAL EXAMINATION:   Physical Exam  Constitutional: She is oriented to person, place, and time and well-developed, well-nourished, and in no distress. No distress.  HENT:  Head: Normocephalic.  Eyes: No scleral icterus.  Neck: Normal range of motion. Neck supple. No JVD present. No tracheal deviation present.  Cardiovascular: Normal rate, regular rhythm and normal heart sounds.  Exam reveals no gallop and no friction rub.   No murmur heard. Pulmonary/Chest: Effort normal and breath sounds normal. No respiratory distress. She has no wheezes. She has no rales. She exhibits no tenderness.  Abdominal: Soft. Bowel sounds are  normal. She exhibits no distension and no mass. There is no tenderness. There is no rebound and no guarding.  Musculoskeletal: Normal range of motion. She exhibits edema.  Neurological: She is alert and oriented to person, place, and time.  Skin: Skin is warm. No rash noted. No erythema.  Yeast under pannus Bilateral lower extremity tawny colored skin changes which are chronic.  Psychiatric: Affect and judgment normal.      LABORATORY PANEL:   CBC  Recent Labs Lab 08/09/15 0508  WBC 8.7  HGB 11.2*  HCT 34.3*  PLT 122*   ------------------------------------------------------------------------------------------------------------------  Chemistries   Recent Labs Lab 08/09/15 0508  NA 139  K 3.7  CL 109  CO2 23  GLUCOSE 156*  BUN 22*  CREATININE 0.86  CALCIUM 8.3*   ------------------------------------------------------------------------------------------------------------------  Cardiac Enzymes  Recent Labs Lab 08/08/15 0601 08/08/15 1010 08/08/15 1619  TROPONINI 1.09* 1.06* 0.97*   ------------------------------------------------------------------------------------------------------------------  RADIOLOGY:  Nm Pulmonary Perf And Vent  08/08/2015   CLINICAL DATA:  Shortness of breath. History of prior lower extremity deep venous thrombosis  EXAM: NUCLEAR MEDICINE VENTILATION - PERFUSION LUNG SCAN  Views: Anterior, posterior, left lateral, right lateral, RPO, LPO, RAO, LAO -ventilation and perfusion  Radionuclide: Technetium 56m DTPA -ventilation ; Technetium 74m macroaggregated albumin-perfusion  Dose:  44.4 mCi-ventilation; 4.3 mCi-perfusion  Route of administration: Inhalation -ventilation ; intravenous -perfusion  COMPARISON:  Chest radiograph August 07, 2015.  FINDINGS: Ventilation: Radiotracer uptake is homogeneous and symmetric bilaterally.  Perfusion: Radiotracer uptake is homogeneous and symmetric bilaterally.  IMPRESSION: There are no appreciable  ventilation or perfusion defects. This study constitutes a very low probability of  pulmonary embolus.   Electronically Signed   By: Bretta Bang III M.D.   On: 08/08/2015 14:19   US Venous Img Lower Bilateral  08/08/2015   CLINICAL DATA:  59 year old female with bilateral lower extremity cramping and history of pulmonary emboli. Evaluate for residual DVT.  EXAM: BILATERAL LOWER EXTREMITY VENOUS DOPPLER  IMPRESSION: No evidence of deep venous thrombosis.   Electronically Signed   By: Malachy Moan M.D.   On: 08/08/2015 15:25   Dg Chest Portable 1 View  08/08/2015   CLINICAL DATA:  Shortness of breath and tachycardia. Hyperglycemia tonight.  EXAM: PORTABLE CHEST - 1 VIEW  COMPARISON:  None.  FINDINGS: Shallow inspiration. The heart size and mediastinal contours are within normal limits. Both lungs are clear. The visualized skeletal structures are unremarkable.  IMPRESSION: No active disease.   Electronically Signed   By: Burman Nieves M.D.   On: 08/08/2015 00:19     ASSESSMENT AND PLAN:   59 year old female with type 2 diabetes and pulmonary emboli who presented with sepsis.  1.severe sepsis: Patient was admitted with tachycardia and fever due to urinary tract infection and gram negative rods bacteremia. Final blood culture and urine culture pending. Continue Rocephin.  2. Hypoxic respiratory failure, acute: I suspect this could be related to acute on chronic diastolic heart failure. IV fluids have been stopped which were initially used for sepsis. Lasix has been started. Today echocardiogram is pending. VQ scan and lower external he Dopplers are negative for PE and DVT, respectively. Continue to wean oxygen as tolerated.   3. Elevated troponin: This is likely secondary to demand ischemia from urosepsis. Appreciate cardio allergy consultation. Echocardiogram is pending. Continue aspirin, Crestor and metoprolol. Continue Eliquis As per cardiology consultation, if there is wall motion  abnormality she will likely need to undergo cardiac evaluation with a cardiac catheterization. She had a stress test in the past which was equivocal.  4. Uncontrolled diabetes: Patient is now off of the insulin drip and placed on Levemir 30 units twice a day. Continue to monitor blood sugars. She has uncontrolled diabetes and will need close follow-up as an outpatient.  5. Essential hypertension: Continue Diovan, metoprolol and HCTZ.  6. OSA: Patient has CPAP that she uses at night.       Management plans discussed with the patient and she is in agreement.  CODE STATUS: FULL   TOTAL TIME TAKING CARE OF THIS PATIENT: 25 minutes.     POSSIBLE D/C 2-3 days, DEPENDING ON CLINICAL CONDITION.   Omaira Mellen M.D on 08/09/2015 at 11:57 AM  Between 7am to 6pm - Pager - [phone] After 6pm go to [email or web address] - password [email or web address]  [facility] [facility]  Office  [phone]  CC: Primary care physician; Wynona Dove, MD

## 2015-08-09 NOTE — Progress Notes (Signed)
eLink Physician-Brief Progress Note Patient Name: Sonya Sanchez DOB: 20-Apr-1956 MRN: [id number]   Date of Service  08/09/2015  HPI/Events of Note  RN notified blood ctx positive for GNR. No vasopressors & hemodynamically stable.  eICU Interventions  Continue Rocephin.     Intervention Category Major Interventions: Infection - evaluation and management  Lawanda Cousins 08/09/2015, 4:07 AM

## 2015-08-09 NOTE — Progress Notes (Signed)
Patient ID: Sonya Sanchez, female   DOB: November 11, 1956, 59 y.o.   MRN: [id number]    SUBJECTIVE: Short of breath this morning, getting NS at 125 cc/hr. Lasix ordered.  No chest pain.  GNRs in blood, getting ceftriaxone.  Creatinine improved.  V/Q negative and LE [place] negative.   Scheduled Meds: . allopurinol  300 mg Oral Daily  . apixaban  5 mg Oral BID  . aspirin  81 mg Oral Daily  . cefTRIAXone (ROCEPHIN) IVPB 2 gram/50 mL D5W (Pyxis)  2 g Intravenous Q24H  . cholecalciferol  2,000 Units Oral Daily  . fenofibrate  54 mg Oral Daily  . furosemide  40 mg Intravenous Q12H  . valsartan  160 mg Oral Daily   And  . hydrochlorothiazide  12.5 mg Oral Daily  . insulin aspart  0-15 Units Subcutaneous TID WC  . insulin aspart  0-5 Units Subcutaneous QHS  . insulin detemir  30 Units Subcutaneous BID  . metoprolol succinate  25 mg Oral Daily  . multivitamin with minerals  1 tablet Oral Daily  . pantoprazole  40 mg Oral Daily  . rosuvastatin  10 mg Oral Daily  . sodium chloride  3 mL Intravenous Q12H   Continuous Infusions: . sodium chloride 125 mL/hr at 08/09/15 0800  . insulin (NOVOLIN-R) infusion Stopped (08/09/15 0145)   PRN Meds:.acetaminophen **OR** acetaminophen, ipratropium-albuterol, morphine injection, ondansetron **OR** ondansetron (ZOFRAN) IV, oxyCODONE, technetium TC 72M diethylenetriame-pentaacetic acid    Filed Vitals:   08/09/15 0500 08/09/15 0600 08/09/15 0700 08/09/15 0800  BP: 126/70 164/72 139/72 163/82  Pulse: 86 102 98 107  Temp:      TempSrc:      Resp: Height:      Weight:      SpO2: 92% 92% 86% 88%    Intake/Output Summary (Last 24 hours) at 08/09/15 0946 Last data filed at 08/09/15 0800  Gross per 24 hour  Intake 3232.76 ml  Output   3825 ml  Net -592.24 ml    LABS: Basic Metabolic Panel:  Recent Labs  14/78/29 0601 08/09/15 0508  NA 134* 139  K 3.8 3.7  CL 105 109  CO2 21* 23  GLUCOSE 409* 156*  BUN 37* 22*  CREATININE 1.41* 0.86    CALCIUM 8.1* 8.3*   Liver Function Tests: No results for input(s): AST, ALT, ALKPHOS, BILITOT, PROT, ALBUMIN in the last 72 hours. No results for input(s): LIPASE, AMYLASE in the last 72 hours. CBC:  Recent Labs  08/08/15 0601 08/09/15 0508  WBC 10.1 8.7  HGB 11.9* 11.2*  HCT 37.1 34.3*  MCV 82.6 82.6  PLT 132* 122*   Cardiac Enzymes:  Recent Labs  08/08/15 0601 08/08/15 1010 08/08/15 1619  TROPONINI 1.09* 1.06* 0.97*   BNP: Invalid input(s): POCBNP D-Dimer: No results for input(s): DDIMER in the last 72 hours. Hemoglobin A1C:  Recent Labs  08/08/15 0601  HGBA1C 15.3*   Fasting Lipid Panel: No results for input(s): CHOL, HDL, LDLCALC, TRIG, CHOLHDL, LDLDIRECT in the last 72 hours. Thyroid Function Tests: No results for input(s): TSH, T4TOTAL, T3FREE, THYROIDAB in the last 72 hours.  Invalid input(s): FREET3 Anemia Panel: No results for input(s): VITAMINB12, FOLATE, FERRITIN, TIBC, IRON, RETICCTPCT in the last 72 hours.  RADIOLOGY: Nm Pulmonary Perf And Vent  08/08/2015   CLINICAL DATA:  Shortness of breath. History of prior lower extremity deep venous thrombosis  EXAM: NUCLEAR MEDICINE VENTILATION - PERFUSION LUNG SCAN  Views: Anterior, posterior, left lateral,  right lateral, RPO, LPO, RAO, LAO -ventilation and perfusion  Radionuclide: Technetium 27m DTPA -ventilation ; Technetium 59m macroaggregated albumin-perfusion  Dose:  44.4 mCi-ventilation; 4.3 mCi-perfusion  Route of administration: Inhalation -ventilation ; intravenous -perfusion  COMPARISON:  Chest radiograph August 07, 2015.  FINDINGS: Ventilation: Radiotracer uptake is homogeneous and symmetric bilaterally.  Perfusion: Radiotracer uptake is homogeneous and symmetric bilaterally.  IMPRESSION: There are no appreciable ventilation or perfusion defects. This study constitutes a very low probability of pulmonary embolus.   Electronically Signed   By: Bretta Bang III M.D.   On: 08/08/2015 14:19   US  Venous Img Lower Bilateral  08/08/2015   CLINICAL DATA:  59 year old female with bilateral lower extremity cramping and history of pulmonary emboli. Evaluate for residual DVT.  EXAM: BILATERAL LOWER EXTREMITY VENOUS DOPPLER ULTRASOUND  TECHNIQUE: Gray-scale sonography with graded compression, as well as color Doppler and duplex ultrasound were performed to evaluate the lower extremity deep venous systems from the level of the common femoral vein and including the common femoral, femoral, profunda femoral, popliteal and calf veins including the posterior tibial, peroneal and gastrocnemius veins when visible. The superficial great saphenous vein was also interrogated. Spectral Doppler was utilized to evaluate flow at rest and with distal augmentation maneuvers in the common femoral, femoral and popliteal veins.  COMPARISON:  None.  FINDINGS: RIGHT LOWER EXTREMITY  Common Femoral Vein: No evidence of thrombus. Normal compressibility, respiratory phasicity and response to augmentation.  Saphenofemoral Junction: No evidence of thrombus. Normal compressibility and flow on color Doppler imaging.  Profunda Femoral Vein: No evidence of thrombus. Normal compressibility and flow on color Doppler imaging.  Femoral Vein: No evidence of thrombus. Normal compressibility, respiratory phasicity and response to augmentation.  Popliteal Vein: No evidence of thrombus. Normal compressibility, respiratory phasicity and response to augmentation.  Calf Veins: No evidence of thrombus. Normal compressibility and flow on color Doppler imaging.  Superficial Great Saphenous Vein: No evidence of thrombus. Normal compressibility and flow on color Doppler imaging.  Venous Reflux:  None.  Other Findings:  None.  LEFT LOWER EXTREMITY  Common Femoral Vein: No evidence of thrombus. Normal compressibility, respiratory phasicity and response to augmentation.  Saphenofemoral Junction: No evidence of thrombus. Normal compressibility and flow on color  Doppler imaging.  Profunda Femoral Vein: No evidence of thrombus. Normal compressibility and flow on color Doppler imaging.  Femoral Vein: No evidence of thrombus. Normal compressibility, respiratory phasicity and response to augmentation.  Popliteal Vein: No evidence of thrombus. Normal compressibility, respiratory phasicity and response to augmentation.  Calf Veins: No evidence of thrombus. Normal compressibility and flow on color Doppler imaging.  Superficial Great Saphenous Vein: No evidence of thrombus. Normal compressibility and flow on color Doppler imaging.  Venous Reflux:  None.  Other Findings:  None.  IMPRESSION: No evidence of deep venous thrombosis.   Electronically Signed   By: Malachy Moan M.D.   On: 08/08/2015 15:25   Dg Chest Portable 1 View  08/08/2015   CLINICAL DATA:  Shortness of breath and tachycardia. Hyperglycemia tonight.  EXAM: PORTABLE CHEST - 1 VIEW  COMPARISON:  None.  FINDINGS: Shallow inspiration. The heart size and mediastinal contours are within normal limits. Both lungs are clear. The visualized skeletal structures are unremarkable.  IMPRESSION: No active disease.   Electronically Signed   By: Burman Nieves M.D.   On: 08/08/2015 00:19    PHYSICAL EXAM General: NAD Neck: Thick, JVP 10 cm, no thyromegaly or thyroid nodule.  Lungs: Crackles at bases bilaterally.  CV: Nondisplaced PMI.  Heart regular S1/S2, no S3/S4, 2/6 early SEM RUSB.  1+ ankle edema.   Abdomen: Soft, nontender, no hepatosplenomegaly, mild distention.  Neurologic: Alert and oriented x 3.  Psych: Normal affect. Extremities: No clubbing or cyanosis.   TELEMETRY: Reviewed telemetry pt in NSR  ASSESSMENT AND PLAN: 59 yo with history of DM, HTN, DVT/PE on Eliquis, morbid obesity presented acutely ill with nonketotic hyperglycemic event and UTI with urosepsis.  1. ID: Suspect UTI with urosepsis.  GNR in blood.  On ceftriaxone.  BP now elevated.  2. DM: Nonketotic hyperglycemic event, managed by  primary service.  3. H/o DVT/PE: V/Q negative, lower extremity US negative.  Back on Eliquis.  4. Elevated troponin: Most likely demand ischemia in setting of urosepsis, marked hyperglycemia, and volume overload.  No significant trend to troponin elevation.  - Would get echo today.  If no new wall motion abnormalities, would treat conservatively.  If new WMAs, will likely need eventual cath.  Had equivocal cardiolite 8/16 (likely attenuation but cannot rule out ischemia).  - Can stop ASA as she is on Eliquis.  5. Acute on chronic diastolic CHF: Short of breath, volume overloaded on exam.  Has been getting NS 125 cc/hr.  Will stop NS.  Already written for IV Lasix.   Marca Ancona 08/09/2015 9:53 AM

## 2015-08-09 NOTE — Progress Notes (Signed)
CRITICAL VALUE ALERT  Critical value received: gram - rod on 2nd aerobic bottle  Date of notification:  08/09/15  Time of notification: 0400  Critical value read back:Yes.    Nurse who received alert:  Olene Floss RN  MD notified (1st page):  Dr Harlon Ditty link Md  Time of first page:  0400  MD notified (2nd page):no  Time of second page:no  Responding MD Dr  Jamison Neighbor  [phone]

## 2015-08-10 DIAGNOSIS — I5033 Acute on chronic diastolic (congestive) heart failure: Secondary | ICD-10-CM

## 2015-08-10 LAB — BASIC METABOLIC PANEL
ANION GAP: 8 (ref 5–15)
BUN: 21 mg/dL — AB (ref 6–20)
CO2: 29 mmol/L (ref 22–32)
Calcium: 8.7 mg/dL — ABNORMAL LOW (ref 8.9–10.3)
Chloride: 101 mmol/L (ref 101–111)
Creatinine, Ser: 0.9 mg/dL (ref 0.44–1.00)
GFR calc Af Amer: >60 mL/min (ref >60)
GFR calc non Af Amer: >60 mL/min (ref >60)
GLUCOSE: 214 mg/dL — AB (ref 65–99)
POTASSIUM: 3.2 mmol/L — AB (ref 3.5–5.1)
Sodium: 138 mmol/L (ref 135–145)

## 2015-08-10 LAB — GLUCOSE, CAPILLARY
GLUCOSE-CAPILLARY: 305 mg/dL — AB (ref 65–99)
Glucose-Capillary: 215 mg/dL — ABNORMAL HIGH (ref 65–99)
Glucose-Capillary: 324 mg/dL — ABNORMAL HIGH (ref 65–99)
Glucose-Capillary: 272 mg/dL — ABNORMAL HIGH (ref 65–99)

## 2015-08-10 LAB — CULTURE, BLOOD (SINGLE)

## 2015-08-10 MED ORDER — POTASSIUM CHLORIDE CRYS ER 20 MEQ PO TBCR
40.0000 meq | EXTENDED_RELEASE_TABLET | Freq: Two times a day (BID) | ORAL | Status: DC
  Administered 2015-08-10 – 2015-08-11 (×3): 40 meq via ORAL
  Filled 2015-08-10 (×3): qty 2

## 2015-08-10 MED ORDER — INFLUENZA VAC SPLIT QUAD 0.5 ML IM SUSY
0.5000 mL | PREFILLED_SYRINGE | INTRAMUSCULAR | Status: AC
  Administered 2015-08-11: 0.5 mL via INTRAMUSCULAR
  Filled 2015-08-10: qty 0.5

## 2015-08-10 NOTE — Progress Notes (Signed)
[facility] [place] [facility]   PATIENT NAME: Sonya Sanchez    MR#:  [id number]  DATE OF BIRTH:  07/30/1956  SUBJECTIVE:  Patient and he needs to have some shortness of breath.  REVIEW OF SYSTEMS:    Review of Systems  Constitutional: Negative for fever, chills and malaise/fatigue.  HENT: Negative for sore throat.   Eyes: Negative for blurred vision.  Respiratory: Positive for shortness of breath. Negative for cough, hemoptysis and wheezing.   Cardiovascular: Negative for chest pain, palpitations and leg swelling.  Gastrointestinal: Negative for nausea, vomiting, abdominal pain, diarrhea and blood in stool.  Genitourinary: Negative for dysuria, urgency, frequency, hematuria and flank pain.  Musculoskeletal: Negative for back pain.  Neurological: Negative for dizziness, tremors and headaches.  Endo/Heme/Allergies: Does not bruise/bleed easily.    Tolerating Diet: Yes  DRUG ALLERGIES:   Allergies  Allergen Reactions  . Sulfa Antibiotics Other (See Comments)    Reaction: isn't certain, thinks she ran a fever, or had a rash, maybe both.    VITALS:  Blood pressure 167/69, pulse 96, temperature 99.2 F (37.3 C), temperature source Oral, resp. rate 18, height  (1.575 m), weight 138.347 kg (305 lb), SpO2 94 %.  PHYSICAL EXAMINATION:   Physical Exam  Constitutional: She is oriented to person, place, and time and well-developed, well-nourished, and in no distress. No distress.  HENT:  Head: Normocephalic.  Eyes: No scleral icterus.  Neck: Normal range of motion. Neck supple. No JVD present. No tracheal deviation present.  Cardiovascular: Normal rate, regular rhythm and normal heart sounds.  Exam reveals no gallop and no friction rub.   No murmur heard. Pulmonary/Chest: Effort normal and breath sounds normal. No respiratory distress. She has no wheezes. She has no rales. She exhibits no tenderness.  Abdominal: Soft. Bowel sounds are normal. She  exhibits no distension and no mass. There is no tenderness. There is no rebound and no guarding.  Musculoskeletal: Normal range of motion. She exhibits edema.  Neurological: She is alert and oriented to person, place, and time.  Skin: Skin is warm. No rash noted. No erythema.  Yeast under pannus Bilateral lower extremity tawny colored skin changes which are chronic.  Psychiatric: Affect and judgment normal.    LABORATORY PANEL:   CBC  Recent Labs Lab 08/09/15 0508  WBC 8.7  HGB 11.2*  HCT 34.3*  PLT 122*   ------------------------------------------------------------------------------------------------------------------  Chemistries   Recent Labs Lab 08/10/15 0342  NA 138  K 3.2*  CL 101  CO2 29  GLUCOSE 214*  BUN 21*  CREATININE 0.90  CALCIUM 8.7*   ------------------------------------------------------------------------------------------------------------------  Cardiac Enzymes  Recent Labs Lab 08/08/15 0601 08/08/15 1010 08/08/15 1619  TROPONINI 1.09* 1.06* 0.97*   ------------------------------------------------------------------------------------------------------------------  RADIOLOGY:  Nm Pulmonary Perf And Vent  08/08/2015   CLINICAL DATA:  Shortness of breath. History of prior lower extremity deep venous thrombosis  EXAM: NUCLEAR MEDICINE VENTILATION - PERFUSION LUNG SCAN  Views: Anterior, posterior, left lateral, right lateral, RPO, LPO, RAO, LAO -ventilation and perfusion  Radionuclide: Technetium 40m DTPA -ventilation ; Technetium 54m macroaggregated albumin-perfusion  Dose:  44.4 mCi-ventilation; 4.3 mCi-perfusion  Route of administration: Inhalation -ventilation ; intravenous -perfusion  COMPARISON:  Chest radiograph August 07, 2015.  FINDINGS: Ventilation: Radiotracer uptake is homogeneous and symmetric bilaterally.  Perfusion: Radiotracer uptake is homogeneous and symmetric bilaterally.  IMPRESSION: There are no appreciable ventilation or perfusion  defects. This study constitutes a very low probability of pulmonary embolus.   Electronically  Signed   By: Bretta Bang III M.D.   On: 08/08/2015 14:19   US Venous Img Lower Bilateral  08/08/2015   CLINICAL DATA:  59 year old female with bilateral lower extremity cramping and history of pulmonary emboli. Evaluate for residual DVT.  EXAM: BILATERAL LOWER EXTREMITY VENOUS DOPPLER  IMPRESSION: No evidence of deep venous thrombosis.   Electronically Signed   By: Malachy Moan M.D.   On: 08/08/2015 15:25   Dg Chest Portable 1 View  08/08/2015   CLINICAL DATA:  Shortness of breath and tachycardia. Hyperglycemia tonight.  EXAM: PORTABLE CHEST - 1 VIEW  COMPARISON:  None.  FINDINGS: Shallow inspiration. The heart size and mediastinal contours are within normal limits. Both lungs are clear. The visualized skeletal structures are unremarkable.  IMPRESSION: No active disease.   Electronically Signed   By: Burman Nieves M.D.   On: 08/08/2015 00:19     ASSESSMENT AND PLAN:   59 year old female with type 2 diabetes and pulmonary emboli who presented with sepsis.  1.severe sepsis: Patient was admitted with tachycardia and fever due to urinary tract infection and gram negative rods bacteremia. Final blood culture and urine culture reviewed.klebsiella. Continue Rocephin. Send repeat culture.  2. Hypoxic respiratory failure, acute: I suspect this could be related to acute on chronic diastolic heart failure. IV fluids have been stopped which were initially used for sepsis. Lasix has been started. Today echocardiogram is pending. VQ scan and lower external he Dopplers are negative for PE and DVT, respectively. Continue to wean oxygen as tolerated.   3. Elevated troponin: This is likely secondary to demand ischemia from urosepsis. Appreciate cardio allergy consultation. Echocardiogram is pending. Continue aspirin, Crestor and metoprolol. Continue Eliquis As per cardiology consultation, if there is wall  motion abnormality she will likely need to undergo cardiac evaluation with a cardiac catheterization. She had a stress test in the past which was equivocal.  4. Uncontrolled diabetes: Patient is now off of the insulin drip and placed on Levemir 30 units twice a day. Continue to monitor blood sugars. She has uncontrolled diabetes and will need close follow-up as an outpatient.  5. Essential hypertension: Continue Diovan, metoprolol and HCTZ.  6. OSA: Patient has CPAP that she uses at night.  7. Hypokalemia- replace oral. Likely due to lasix use.   Management plans discussed with the patient and she is in agreement.  CODE STATUS: FULL   TOTAL TIME TAKING CARE OF THIS PATIENT: 25 minutes.    POSSIBLE D/C 2-3 days, DEPENDING ON CLINICAL CONDITION.   Altamese Dilling M.D on 08/10/2015 at 4:56 PM  Between 7am to 6pm - Pager - [phone] After 6pm go to [email or web address] - password [email or web address] [facility]  [facility]  Office  [phone]  CC: Primary care physician; Wynona Dove, MD

## 2015-08-10 NOTE — Progress Notes (Signed)
Patient ID: Sonya Sanchez, female   DOB: [birth year], 59 y.o.   MRN: [id number]   SUBJECTIVE: Breathing better today, says she urinated a lot on IV Lasix yesterday.  I/Os and weights not recorded.  Still some dyspnea.  V/Q negative and LE [place] negative.  Klebsiella in blood.   Scheduled Meds: . allopurinol  300 mg Oral Daily  . apixaban  5 mg Oral BID  . cefTRIAXone (ROCEPHIN) IVPB 2 gram/50 mL D5W (Pyxis)  2 g Intravenous Q24H  . cholecalciferol  2,000 Units Oral Daily  . fenofibrate  54 mg Oral Daily  . furosemide  40 mg Intravenous Q12H  . valsartan  160 mg Oral Daily   And  . hydrochlorothiazide  12.5 mg Oral Daily  . insulin aspart  0-15 Units Subcutaneous TID WC  . insulin aspart  0-5 Units Subcutaneous QHS  . insulin detemir  30 Units Subcutaneous BID  . metoprolol succinate  25 mg Oral Daily  . multivitamin with minerals  1 tablet Oral Daily  . pantoprazole  40 mg Oral Daily  . potassium chloride  40 mEq Oral BID  . rosuvastatin  10 mg Oral Daily  . sodium chloride  3 mL Intravenous Q12H   Continuous Infusions: . insulin (NOVOLIN-R) infusion Stopped (08/09/15 0145)   PRN Meds:.acetaminophen **OR** acetaminophen, ipratropium-albuterol, morphine injection, ondansetron **OR** ondansetron (ZOFRAN) IV, oxyCODONE, technetium TC 71M diethylenetriame-pentaacetic acid    Filed Vitals:   08/09/15 2045 08/10/15 0008 08/10/15 0439 08/10/15 0758  BP: 158/74 138/70 164/73 170/76  Pulse: 108 95 96 103  Temp: 99.3 F (37.4 C) 98.4 F (36.9 C) 97.8 F (36.6 C) 98.7 F (37.1 C)  TempSrc: Oral  Oral Oral  Resp: Height:      Weight:      SpO2: 93% 94% 92% 93%    Intake/Output Summary (Last 24 hours) at 08/10/15 1038 Last data filed at 08/10/15 0900  Gross per 24 hour  Intake    483 ml  Output   2100 ml  Net  -1617 ml    LABS: Basic Metabolic Panel:  Recent Labs  78/29/56 0508 08/10/15 0342  NA 139 138  K 3.7 3.2*  CL 109 101  CO2 23 29  GLUCOSE 156* 214*    BUN 22* 21*  CREATININE 0.86 0.90  CALCIUM 8.3* 8.7*   Liver Function Tests: No results for input(s): AST, ALT, ALKPHOS, BILITOT, PROT, ALBUMIN in the last 72 hours. No results for input(s): LIPASE, AMYLASE in the last 72 hours. CBC:  Recent Labs  08/08/15 0601 08/09/15 0508  WBC 10.1 8.7  HGB 11.9* 11.2*  HCT 37.1 34.3*  MCV 82.6 82.6  PLT 132* 122*   Cardiac Enzymes:  Recent Labs  08/08/15 0601 08/08/15 1010 08/08/15 1619  TROPONINI 1.09* 1.06* 0.97*   BNP: Invalid input(s): POCBNP D-Dimer: No results for input(s): DDIMER in the last 72 hours. Hemoglobin A1C:  Recent Labs  08/08/15 0601  HGBA1C 15.3*   Fasting Lipid Panel: No results for input(s): CHOL, HDL, LDLCALC, TRIG, CHOLHDL, LDLDIRECT in the last 72 hours. Thyroid Function Tests: No results for input(s): TSH, T4TOTAL, T3FREE, THYROIDAB in the last 72 hours.  Invalid input(s): FREET3 Anemia Panel: No results for input(s): VITAMINB12, FOLATE, FERRITIN, TIBC, IRON, RETICCTPCT in the last 72 hours.  RADIOLOGY: Nm Pulmonary Perf And Vent  08/08/2015   CLINICAL DATA:  Shortness of breath. History of prior lower extremity deep venous thrombosis  EXAM: NUCLEAR MEDICINE VENTILATION - PERFUSION  LUNG SCAN  Views: Anterior, posterior, left lateral, right lateral, RPO, LPO, RAO, LAO -ventilation and perfusion  Radionuclide: Technetium 36m DTPA -ventilation ; Technetium 41m macroaggregated albumin-perfusion  Dose:  44.4 mCi-ventilation; 4.3 mCi-perfusion  Route of administration: Inhalation -ventilation ; intravenous -perfusion  COMPARISON:  Chest radiograph August 07, 2015.  FINDINGS: Ventilation: Radiotracer uptake is homogeneous and symmetric bilaterally.  Perfusion: Radiotracer uptake is homogeneous and symmetric bilaterally.  IMPRESSION: There are no appreciable ventilation or perfusion defects. This study constitutes a very low probability of pulmonary embolus.   Electronically Signed   By: Bretta Bang III  M.D.   On: 08/08/2015 14:19   US Venous Img Lower Bilateral  08/08/2015   CLINICAL DATA:  59 year old female with bilateral lower extremity cramping and history of pulmonary emboli. Evaluate for residual DVT.  EXAM: BILATERAL LOWER EXTREMITY VENOUS DOPPLER ULTRASOUND  TECHNIQUE: Gray-scale sonography with graded compression, as well as color Doppler and duplex ultrasound were performed to evaluate the lower extremity deep venous systems from the level of the common femoral vein and including the common femoral, femoral, profunda femoral, popliteal and calf veins including the posterior tibial, peroneal and gastrocnemius veins when visible. The superficial great saphenous vein was also interrogated. Spectral Doppler was utilized to evaluate flow at rest and with distal augmentation maneuvers in the common femoral, femoral and popliteal veins.  COMPARISON:  None.  FINDINGS: RIGHT LOWER EXTREMITY  Common Femoral Vein: No evidence of thrombus. Normal compressibility, respiratory phasicity and response to augmentation.  Saphenofemoral Junction: No evidence of thrombus. Normal compressibility and flow on color Doppler imaging.  Profunda Femoral Vein: No evidence of thrombus. Normal compressibility and flow on color Doppler imaging.  Femoral Vein: No evidence of thrombus. Normal compressibility, respiratory phasicity and response to augmentation.  Popliteal Vein: No evidence of thrombus. Normal compressibility, respiratory phasicity and response to augmentation.  Calf Veins: No evidence of thrombus. Normal compressibility and flow on color Doppler imaging.  Superficial Great Saphenous Vein: No evidence of thrombus. Normal compressibility and flow on color Doppler imaging.  Venous Reflux:  None.  Other Findings:  None.  LEFT LOWER EXTREMITY  Common Femoral Vein: No evidence of thrombus. Normal compressibility, respiratory phasicity and response to augmentation.  Saphenofemoral Junction: No evidence of thrombus. Normal  compressibility and flow on color Doppler imaging.  Profunda Femoral Vein: No evidence of thrombus. Normal compressibility and flow on color Doppler imaging.  Femoral Vein: No evidence of thrombus. Normal compressibility, respiratory phasicity and response to augmentation.  Popliteal Vein: No evidence of thrombus. Normal compressibility, respiratory phasicity and response to augmentation.  Calf Veins: No evidence of thrombus. Normal compressibility and flow on color Doppler imaging.  Superficial Great Saphenous Vein: No evidence of thrombus. Normal compressibility and flow on color Doppler imaging.  Venous Reflux:  None.  Other Findings:  None.  IMPRESSION: No evidence of deep venous thrombosis.   Electronically Signed   By: Malachy Moan M.D.   On: 08/08/2015 15:25   Dg Chest Portable 1 View  08/08/2015   CLINICAL DATA:  Shortness of breath and tachycardia. Hyperglycemia tonight.  EXAM: PORTABLE CHEST - 1 VIEW  COMPARISON:  None.  FINDINGS: Shallow inspiration. The heart size and mediastinal contours are within normal limits. Both lungs are clear. The visualized skeletal structures are unremarkable.  IMPRESSION: No active disease.   Electronically Signed   By: Burman Nieves M.D.   On: 08/08/2015 00:19    PHYSICAL EXAM General: NAD Neck: Thick, JVP 10 cm, no thyromegaly or  thyroid nodule.  Lungs: Crackles at bases bilaterally. CV: Nondisplaced PMI.  Heart regular S1/S2, no S3/S4, 2/6 early SEM RUSB.  Trace ankle edema.   Abdomen: Soft, nontender, no hepatosplenomegaly, mild distention.  Neurologic: Alert and oriented x 3.  Psych: Normal affect. Extremities: No clubbing or cyanosis.   TELEMETRY: Reviewed telemetry pt in NSR  ASSESSMENT AND PLAN: 59 yo with history of DM, HTN, DVT/PE on Eliquis, morbid obesity presented acutely ill with nonketotic hyperglycemic event and UTI with urosepsis.  1. ID: No PNA on initial CXR.  Suspect UTI with urosepsis.  Klebsiella in blood.  On ceftriaxone.     2. DM: Nonketotic hyperglycemic event, managed by primary service.  3. H/o DVT/PE: V/Q negative, lower extremity US negative.  Back on Eliquis.  Echo was difficult study but showed mildly dilated and hypokinetic RV with D-shape to the IV septum.  This is consistent with elevated PA pressure (unable to estimate PA pressure from this study).  4. Elevated troponin: Most likely demand ischemia in setting of urosepsis, marked hyperglycemia, and volume overload.  No significant trend to troponin elevation. No wall motion abnormalities on echo (though very difficult study).  - Can stop ASA as she is on Eliquis.  5. Acute on chronic diastolic CHF: She is still volume overloaded on exam.  Echo with normal LV EF, signs of RV dysfunction.  This suggests elevated PA pressure though could not estimate PA pressure from this study.  No evidence on V/Q scan for chronic thromboembolic disease so may be related to OSA, ?OHS.   - Follow I/Os and weight.  - Would continue Lasix 40 mg IV bid today, replete K (ordered).   Marca Ancona 08/10/2015 10:38 AM

## 2015-08-11 ENCOUNTER — Telehealth: Payer: Self-pay | Admitting: Internal Medicine

## 2015-08-11 ENCOUNTER — Telehealth: Payer: Self-pay

## 2015-08-11 DIAGNOSIS — R778 Other specified abnormalities of plasma proteins: Secondary | ICD-10-CM

## 2015-08-11 DIAGNOSIS — R7989 Other specified abnormal findings of blood chemistry: Secondary | ICD-10-CM

## 2015-08-11 LAB — BASIC METABOLIC PANEL
ANION GAP: 8 (ref 5–15)
BUN: 25 mg/dL — ABNORMAL HIGH (ref 6–20)
CALCIUM: 8.8 mg/dL — AB (ref 8.9–10.3)
CO2: 31 mmol/L (ref 22–32)
Chloride: 99 mmol/L — ABNORMAL LOW (ref 101–111)
Creatinine, Ser: 1.01 mg/dL — ABNORMAL HIGH (ref 0.44–1.00)
GFR calc Af Amer: >60 mL/min (ref >60)
GFR calc non Af Amer: 59 mL/min — ABNORMAL LOW (ref >60)
GLUCOSE: 202 mg/dL — AB (ref 65–99)
POTASSIUM: 4.5 mmol/L (ref 3.5–5.1)
Sodium: 138 mmol/L (ref 135–145)

## 2015-08-11 LAB — HEMOGLOBIN: Hemoglobin: 12 g/dL (ref 12.0–16.0)

## 2015-08-11 LAB — CULTURE, BLOOD (SINGLE)

## 2015-08-11 LAB — BRAIN NATRIURETIC PEPTIDE: B Natriuretic Peptide: 178 pg/mL — ABNORMAL HIGH (ref 0.0–100.0)

## 2015-08-11 LAB — GLUCOSE, CAPILLARY
GLUCOSE-CAPILLARY: 297 mg/dL — AB (ref 65–99)
Glucose-Capillary: 190 mg/dL — ABNORMAL HIGH (ref 65–99)
Glucose-Capillary: 299 mg/dL — ABNORMAL HIGH (ref 65–99)

## 2015-08-11 LAB — URINE CULTURE: SPECIAL REQUESTS: NORMAL

## 2015-08-11 MED ORDER — FUROSEMIDE 40 MG PO TABS: 40.0000 mg | ORAL_TABLET | Freq: Every day | ORAL | Status: DC

## 2015-08-11 MED ORDER — INSULIN DETEMIR 100 UNIT/ML ~~LOC~~ SOLN: SUBCUTANEOUS | Status: DC

## 2015-08-11 MED ORDER — CEFUROXIME AXETIL 250 MG PO TABS: 250.0000 mg | ORAL_TABLET | Freq: Two times a day (BID) | ORAL | Status: DC

## 2015-08-11 NOTE — Care Management (Signed)
Patient states she has a CPAP that she uses at night but her sleep study if over 61 years old. She left her CPAP in [place] that she states was provided through [facility] [phone]. I contacted [facility] and they no longer show her as a patient. Patient advised to get another outpatient sleep study and that way CPAP could be arranged here in [place]. She does not "want anything permanent since she will not be staying here in [place]". She uses [facility] for Rx. She denies any other needs.

## 2015-08-11 NOTE — Telephone Encounter (Signed)
If she is heading back to [place] in 1 week, she may need to follow up there or with another provider, as I don't see any openings.

## 2015-08-11 NOTE — Progress Notes (Addendum)
Inpatient Diabetes Program Recommendations  AACE/ADA: New Consensus Statement on Inpatient Glycemic Control (2013)  Target Ranges:  Prepandial:   less than 140 mg/dL      Peak postprandial:   less than 180 mg/dL (1-2 hours)      Critically ill patients:  140 - 180 mg/dL   Results for Sonya Sanchez, Sonya Sanchez (MRN [id number]) as of 08/11/2015 08:05  Ref. Range 08/10/2015 08:00 08/10/2015 11:33 08/10/2015 15:08 08/10/2015 21:05 08/11/2015 07:36  Glucose-Capillary Latest Ref Range: 65-99 mg/dL 409 (H) 811 (H) 914 (H) 305 (H) 190 (H)   Outpatient DM medications: Levemir 60 units QAM, Levemir 25 units QHS, Humalog 5 units TID with meals, Janumet 50-1000 mg BID, Bydureon 2 mg Q week Current orders for Inpatient glycemic control: Levemir 30 units BID, Novolog 0-15 units TID with meals, Novolog 0-5 units HS  Inpatient Diabetes Program Recommendations Insulin - Basal: Please consider increasing morning dose of Levemir to 35 units QAM and continue Levemir 30 units QHS. Insulin - Meal Coverage: Please consider ordering Novolog 8 units TID with meals for meal coverage (in addition to Novolog correction scale). HgbA1C: A1C 15.3% on 08/08/15 indicating very poor glycemic control.   08/11/15@13 :11. Spoke with patient about diabetes and home regimen for diabetes control. Patient reports that she is followed by her Endocrinologist in [place] (Dr. Laverda Page) for diabetes management and currently she takes Levemir 65 units QAM, Levemir 35 units QHS, Humalog 21 units with supper, Janumet 50-1000 mg daily, and Bydureon 2 mg weekly on Monday as an outpatient for diabetes control. Patient reports that she is currently in [place] visiting family and that she comes to [place] to visit about 3 months every year. She plans on returning to [place] as soon as she is able to.  Patient reports that she last saw Dr. Gardenia Phlegm in August and her A1C was "High" and she was suppose to start on a newer insulin. Patient does not recall name of new insulin but reports  that there was an issue with the insurance company and they had not yet gotten the issue resolved so she never started on the new insulin (she was not provided with any type of samples). Inquired about knowledge about A1C and patient reports that she knows what an A1C is but she was not sure of what her most current A1C was. Discussed A1C results (15.3% on 08/08/15), reviewed what an what an A1C is, A1C goal of 7% or less, and explained that current A1C indicated glucose average of 372 mg/dl. Patient admits that she was not doing what she needed to do to maintain her diabetes and reports eating excessive carbohydrates (breads and pasta). Discussed basic pathophysiology of DM Type 2, basic home care, importance of checking CBGs and maintaining good CBG control to prevent long-term and short-term complications. Discussed impact of nutrition, exercise, stress, sickness, and medications on diabetes control.  Discussed carbohydrates, carbohydrate goals per day and meal, along with portion sizes. Provided handout on Daily Diabetes Meal Planning guide and encouraged patient to choose healthier options on Carb Modified diet.  Encouraged patient check glucose 3-4 times per day and to keep a glucose and medication log which her doctor can use to help make insulin adjustments to improve glycemic control. Encouraged patient to call Dr. Gardenia Phlegm and see if she can make an appointment to see him to discuss other options for DM medication changes since new insulin had not yet been improved. Patient reports that she is "motivated now to get serious about my diabetes  and improve my health.  Patient verbalized understanding of information discussed and she states that she has no further questions at this time related to diabetes.   Thanks, Orlando Penner, RN, MSN, CCRN, CDE Diabetes Coordinator Inpatient Diabetes Program [phone] (Team Pager from 8am to 5pm) [phone] ([facility] office) [phone] [facility] office) [phone] [facility]  office)

## 2015-08-11 NOTE — Telephone Encounter (Signed)
Pt needs a hospital follow up for sepsis. Please advise where to schedule. Per nurse Marchelle Folks [phone]

## 2015-08-11 NOTE — Telephone Encounter (Signed)
Pt lives in [place], pt is here in [place] visiting family, she will be leaving back to [place] in approx 1 week.  Pt's concern is needing a CPAP machine and supplies, social worker is working on trying to get her a Company secretary.

## 2015-08-11 NOTE — Telephone Encounter (Signed)
Patient contacted regarding discharge from [facility] on 08/11/15.  Patient understands to follow up with Eula Listen, PA on 08/19/15 at 2:00 at [facility]. Patient understands discharge instructions? yes Patient understands medications and regiment? yes Patient understands to bring all medications to this visit? yes  Pt would like to be added to the waiting list in the event of a cancellation this week, as she would like to get back to [place] ASAP.

## 2015-08-11 NOTE — Telephone Encounter (Signed)
Achille Rich @ [facility]

## 2015-08-11 NOTE — Telephone Encounter (Signed)
-----   Message from Coralee Rud sent at 08/11/2015 10:20 AM EDT ----- Regarding: tcm/ph 08/19/2015 Eula Listen, PA 2:00

## 2015-08-11 NOTE — Discharge Summary (Signed)
[facility] [place] [facility]   PATIENT NAME: Sonya Sanchez    MR#:  [id number]  DATE OF BIRTH:  [birth year]  DATE OF ADMISSION:  08/07/2015 ADMITTING PHYSICIAN: Wyatt Haste, MD  DATE OF DISCHARGE: 08/11/2015  PRIMARY CARE PHYSICIAN: Wynona Dove, MD    ADMISSION DIAGNOSIS:  Hyperglycemia [R73.9] Acute cystitis without hematuria [N30.00] Sepsis, due to unspecified organism [A41.9]  DISCHARGE DIAGNOSIS:  Active Problems:   Severe sepsis- Blood cx positive fro klebsiella Pneumonia.   Hyperglycemia   Blood poisoning   NSTEMI (non-ST elevated myocardial infarction)   Poorly controlled type 2 diabetes mellitus with circulatory disorder   SECONDARY DIAGNOSIS:   Past Medical History  Diagnosis Date  . Heart murmur   . Diabetes mellitus without complication   . Urinary tract infection, site not specified   . Gout, unspecified   . Calculus of kidney   . Unspecified sleep apnea   . Type II or unspecified type diabetes mellitus without mention of complication, uncontrolled   . Morbid obesity   . Phlebitis and thrombophlebitis of other deep vessels of lower extremities   . Pulmonary emboli     a. on Eliquis  . Phlebitis and thrombophlebitis of other deep vessels of lower extremities   . Spinal stenosis, unspecified region other than cervical   . Hyperlipidemia   . Hypertension   . Normal cardiac stress test     a. equivocal study, sig soft tissue artifact present, no chest discomfort or ECG changes, perfusion images suggest mod sized region of mild reversible perfusion defect. Findings may be 2/2 shifting soft tissue attenuation, but cannot r/o ischemia, EF 72%  . DVT (deep venous thrombosis)     a. on Eliquis  . Family history of early CAD     a. parents passing in their 63's from CAD  . Tobacco abuse     HOSPITAL COURSE:   1.severe sepsis: Patient was admitted with tachycardia and fever due to urinary tract infection and gram  negative rods bacteremia. Final blood culture and urine culture reviewed.klebsiella. Continued Rocephin.    Repeat Blood cx 9/11- negative for < 24 hrs.   Will give 10 days oral course of Cefuroxime.  2. Hypoxic respiratory failure, acute: related to acute on chronic diastolic heart failure. IV fluids have been stopped which were initially used for sepsis. Lasix has been started.  Echocardiogram showed EF 60%.  VQ scan and lower external he Dopplers are negative for PE and DVT,  on room air to wean oxygen as tolerated.  She have component of sleep apnea- her machine at home is broken- I advised to work with PMD to get one prescribed again.  3. Elevated troponin: This is likely secondary to demand ischemia from urosepsis.  Appreciate cardio consultation.  Continue aspirin, Crestor and metoprolol. Continue Eliquis  Follow in cardiology office.  4. Uncontrolled diabetes:  Initially was on the insulin drip and placed on Levemir 30 units twice a day. Continue to monitor blood sugars. She has uncontrolled diabetes and will need close follow-up as an outpatient.  5. Essential hypertension: Continue Diovan, metoprolol and HCTZ.  6. OSA: Patient has CPAP that she uses at night.   Home machine is broken- need to get a new one with PMD's help.  7. Hypokalemia- replace oral. Likely due to lasix use. Corrected.  DISCHARGE CONDITIONS:   Stable,  CONSULTS OBTAINED:  Treatment Team:  Wyatt Haste, MD Antonieta Iba, MD  DRUG ALLERGIES:  Allergies  Allergen Reactions  . Sulfa Antibiotics Other (See Comments)    Reaction: isn't certain, thinks she ran a fever, or had a rash, maybe both.    DISCHARGE MEDICATIONS:   Current Discharge Medication List    START taking these medications   Details  cefUROXime (CEFTIN) 250 MG tablet Take 1 tablet (250 mg total) by mouth 2 (two) times daily with a meal. Qty: 20 tablet, Refills: 0      CONTINUE these medications which have CHANGED    Details  furosemide (LASIX) 40 MG tablet Take 1 tablet (40 mg total) by mouth daily. Qty: 30 tablet, Refills: 0    insulin detemir (LEVEMIR) 100 UNIT/ML injection 40units qam and 25units at bedtime Qty: 10 mL, Refills: 6   Associated Diagnoses: Diabetes      CONTINUE these medications which have NOT CHANGED   Details  allopurinol (ZYLOPRIM) 300 MG tablet Take 300 mg by mouth daily.    apixaban (ELIQUIS) 5 MG TABS tablet Take 5 mg by mouth 2 (two) times daily.    aspirin 81 MG chewable tablet Chew 81 mg by mouth daily.    BYDUREON 2 MG PEN 2 mg once a week.  Refills: 3    Cholecalciferol (VITAMIN D) 1000 UNITS capsule Take 2,000 Units by mouth daily.    esomeprazole (NEXIUM) 40 MG capsule Take 40 mg by mouth daily before breakfast.    fenofibrate 54 MG tablet Take 54 mg by mouth daily.    insulin lispro (HUMALOG KWIKPEN) 100 UNIT/ML injection Inject 5 Units into the skin 3 (three) times daily before meals. Qty: 10 mL, Refills: 6    metoprolol succinate (TOPROL-XL) 25 MG 24 hr tablet Take 25 mg by mouth daily.    Multiple Vitamin (MULTIVITAMIN) tablet Take 1 tablet by mouth daily.    rosuvastatin (CRESTOR) 10 MG tablet Take 10 mg by mouth daily.    sitaGLIPtan-metformin (JANUMET) 50-1000 MG per tablet Take 1 tablet by mouth daily.    valsartan-hydrochlorothiazide (DIOVAN-HCT) 160-12.5 MG per tablet Take 1 tablet by mouth daily.         DISCHARGE INSTRUCTIONS:    Follow with Cardiology and PMD's office in 1 week.  If you experience worsening of your admission symptoms, develop shortness of breath, life threatening emergency, suicidal or homicidal thoughts you must seek medical attention immediately by calling 911 or calling your MD immediately  if symptoms less severe.  You Must read complete instructions/literature along with all the possible adverse reactions/side effects for all the Medicines you take and that have been prescribed to you. Take any new Medicines after  you have completely understood and accept all the possible adverse reactions/side effects.   Please note  You were cared for by a hospitalist during your hospital stay. If you have any questions about your discharge medications or the care you received while you were in the hospital after you are discharged, you can call the unit and asked to speak with the hospitalist on call if the hospitalist that took care of you is not available. Once you are discharged, your primary care physician will handle any further medical issues. Please note that NO REFILLS for any discharge medications will be authorized once you are discharged, as it is imperative that you return to your primary care physician (or establish a relationship with a primary care physician if you do not have one) for your aftercare needs so that they can reassess your need for medications and monitor your lab values.  Today   CHIEF COMPLAINT:   Chief Complaint  Patient presents with  . Hyperglycemia    HISTORY OF PRESENT ILLNESS:  Lajeana Strough  is a 59 y.o. female with a known history of type 2 diabetes insulin requiring, presenting with elevated glucose, shortness of breath. She recently drove 10 [place] from [place] about 4 days ago. She has not taken any of her medications and that time. Presenting with markedly elevated blood sugars at home as well as shortness of breath. She's been describing shortness of breath last 2 days total duration with associated subjective fever and chills. She had shaking chills today of at the request of her sister came to the hospital for further workup and evaluation. She describes having increased thirst as well as increased urination denies further symptomatology at this time   VITAL SIGNS:  Blood pressure 162/70, pulse 86, temperature 98.4 F (36.9 C), temperature source Oral, resp. rate 18, height 5\' 2"  (1.575 m), weight 138.71 kg (305 lb 12.8 oz), SpO2 90 %.  I/O:   Intake/Output Summary  (Last 24 hours) at 08/11/15 0947 Last data filed at 08/11/15 0719  Gross per 24 hour  Intake    660 ml  Output    900 ml  Net   -240 ml    PHYSICAL EXAMINATION:   Constitutional: She is oriented to person, place, and time and well-developed, well-nourished, and in no distress. No distress.  HENT:  Head: Normocephalic.  Eyes: No scleral icterus.  Neck: Normal range of motion. Neck supple. No JVD present. No tracheal deviation present.  Cardiovascular: Normal rate, regular rhythm and normal heart sounds. Exam reveals no gallop and no friction rub.  No murmur heard. Pulmonary/Chest: Effort normal and breath sounds normal. No respiratory distress. She has no wheezes. She has no rales. She exhibits no tenderness.  Abdominal: Soft. Bowel sounds are normal. She exhibits no distension and no mass. There is no tenderness. There is no rebound and no guarding.  Musculoskeletal: Normal range of motion. She exhibits edema.  Neurological: She is alert and oriented to person, place, and time.  Skin: Skin is warm. No rash noted. No erythema.  Yeast under pannus Bilateral lower extremity tawny colored skin changes which are chronic.  Psychiatric: Affect and judgment normal.   DATA REVIEW:   CBC  Recent Labs Lab 08/09/15 0508 08/11/15 0327  WBC 8.7  --   HGB 11.2* 12.0  HCT 34.3*  --   PLT 122*  --     Chemistries   Recent Labs Lab 08/11/15 0327  NA 138  K 4.5  CL 99*  CO2 31  GLUCOSE 202*  BUN 25*  CREATININE 1.01*  CALCIUM 8.8*    Cardiac Enzymes  Recent Labs Lab 08/08/15 1619  TROPONINI 0.97*    Microbiology Results  Results for orders placed or performed during the hospital encounter of 08/07/15  Blood culture (single)     Status: None   Collection Time: 08/08/15 12:11 AM  Result Value Ref Range Status   Specimen Description BLOOD BLOOD LEFT FOREARM  Final   Special Requests BOTTLES DRAWN AEROBIC AND ANAEROBIC  Final   Culture  Setup Time   Final     GRAM NEGATIVE RODS IN BOTH AEROBIC AND ANAEROBIC BOTTLES CRITICAL RESULT CALLED TO, READ BACK BY AND VERIFIED WITH: EMMA EGWUATU AT 0402 ON 08/09/15 RWW CONFIRMED BY [facility]    Culture   Final    KLEBSIELLA PNEUMONIAE IN BOTH AEROBIC AND ANAEROBIC BOTTLES  Report Status 08/11/2015 FINAL  Final   Organism ID, Bacteria KLEBSIELLA PNEUMONIAE  Final      Susceptibility   Klebsiella pneumoniae - MIC*    AMPICILLIN >=32 RESISTANT Resistant     CEFTAZIDIME <=1 SENSITIVE Sensitive     CEFAZOLIN <=4 SENSITIVE Sensitive     CEFTRIAXONE <=1 SENSITIVE Sensitive     CIPROFLOXACIN <=0.25 SENSITIVE Sensitive     GENTAMICIN <=1 SENSITIVE Sensitive     IMIPENEM <=0.25 SENSITIVE Sensitive     TRIMETH/SULFA <=20 SENSITIVE Sensitive     PIP/TAZO Value in next row Sensitive      SENSITIVE<=4    ERTAPENEM Value in next row Sensitive      SENSITIVE<=0.5    * KLEBSIELLA PNEUMONIAE  Blood culture (single)     Status: None   Collection Time: 08/08/15 12:11 AM  Result Value Ref Range Status   Specimen Description BLOOD BLOOD LEFT FOREARM  Final   Special Requests   Final    BOTTLES DRAWN AEROBIC AND ANAEROBIC ,   Culture  Setup Time   Final    GRAM NEGATIVE RODS IN BOTH AEROBIC AND ANAEROBIC BOTTLES CRITICAL RESULT CALLED TO, READ BACK BY AND VERIFIED WITH: CHERYL SMITH,RN 08/08/2015 1420 BY JRS    Culture   Final    KLEBSIELLA PNEUMONIAE IN BOTH AEROBIC AND ANAEROBIC BOTTLES    Report Status 08/10/2015 FINAL  Final   Organism ID, Bacteria KLEBSIELLA PNEUMONIAE  Final      Susceptibility   Klebsiella pneumoniae - MIC*    AMPICILLIN >=32 RESISTANT Resistant     CEFTAZIDIME <=1 SENSITIVE Sensitive     CEFAZOLIN <=4 SENSITIVE Sensitive     CEFTRIAXONE <=1 SENSITIVE Sensitive     CIPROFLOXACIN <=0.25 SENSITIVE Sensitive     GENTAMICIN <=1 SENSITIVE Sensitive     IMIPENEM <=0.25 SENSITIVE Sensitive     TRIMETH/SULFA <=20 SENSITIVE Sensitive     PIP/TAZO Value in next row  Sensitive      SENSITIVE<=4    ERTAPENEM Value in next row Sensitive      SENSITIVE<=0.5    * KLEBSIELLA PNEUMONIAE  Urine culture     Status: None (Preliminary result)   Collection Time: 08/08/15 11:54 AM  Result Value Ref Range Status   Specimen Description URINE, RANDOM  Final   Special Requests Normal  Final   Culture   Final    30,000 COLONIES/mL YEAST IDENTIFICATION TO FOLLOW    Report Status PENDING  Incomplete  Culture, blood (routine x 2)     Status: None (Preliminary result)   Collection Time: 08/10/15  5:21 PM  Result Value Ref Range Status   Specimen Description BLOOD RIGHT HAND  Final   Special Requests   Final    BOTTLES DRAWN AEROBIC AND ANAEROBIC  AER 7CC ANA 2CC   Culture NO GROWTH < 24 HOURS  Final   Report Status PENDING  Incomplete  Culture, blood (routine x 2)     Status: None (Preliminary result)   Collection Time: 08/10/15  5:28 PM  Result Value Ref Range Status   Specimen Description BLOOD LEFT FOREARM  Final   Special Requests   Final    BOTTLES DRAWN AEROBIC AND ANAEROBIC  AER 11CC ANA 9CC   Culture NO GROWTH < 24 HOURS  Final   Report Status PENDING  Incomplete    RADIOLOGY:  No results found.   Management plans discussed with the patient, family and they are in agreement.  CODE STATUS:  Code Status Orders        Start     Ordered   08/08/15 0149  Full code   Continuous     08/08/15 0148      TOTAL TIME TAKING CARE OF THIS PATIENT: 35 minutes.    Altamese Dilling M.D on 08/11/2015 at 9:47 AM  Between 7am to 6pm - Pager - [phone]  After 6pm go to [email or web address] - password [email or web address] [facility]  [facility]  Office  [phone]  CC: Primary care physician; Wynona Dove, MD

## 2015-08-13 ENCOUNTER — Ambulatory Visit (INDEPENDENT_AMBULATORY_CARE_PROVIDER_SITE_OTHER): Payer: PRIVATE HEALTH INSURANCE | Admitting: Cardiovascular Disease

## 2015-08-13 ENCOUNTER — Encounter: Payer: Self-pay | Admitting: Cardiovascular Disease

## 2015-08-13 VITALS — BP 108/58 | HR 91 | Ht 62.0 in | Wt 301.8 lb

## 2015-08-13 DIAGNOSIS — R7989 Other specified abnormal findings of blood chemistry: Secondary | ICD-10-CM | POA: Diagnosis not present

## 2015-08-13 DIAGNOSIS — E1151 Type 2 diabetes mellitus with diabetic peripheral angiopathy without gangrene: Secondary | ICD-10-CM

## 2015-08-13 DIAGNOSIS — I2699 Other pulmonary embolism without acute cor pulmonale: Secondary | ICD-10-CM | POA: Diagnosis not present

## 2015-08-13 DIAGNOSIS — R778 Other specified abnormalities of plasma proteins: Secondary | ICD-10-CM

## 2015-08-13 DIAGNOSIS — E1159 Type 2 diabetes mellitus with other circulatory complications: Secondary | ICD-10-CM

## 2015-08-13 DIAGNOSIS — R0602 Shortness of breath: Secondary | ICD-10-CM

## 2015-08-13 DIAGNOSIS — E1165 Type 2 diabetes mellitus with hyperglycemia: Secondary | ICD-10-CM

## 2015-08-13 MED ORDER — POTASSIUM CHLORIDE ER 10 MEQ PO TBCR: 10.0000 meq | EXTENDED_RELEASE_TABLET | Freq: Every day | ORAL | Status: DC | PRN

## 2015-08-13 NOTE — Assessment & Plan Note (Signed)
We have stressed the importance of better diabetes control, diet restriction, taking her insulin, using a sliding scale She has follow-up with endocrinology up in [place]

## 2015-08-13 NOTE — Patient Instructions (Addendum)
You are doing well. No medication changes were made.  Continue lasix as needed for chest congestion, significant leg swelling Take a potassium pill sometimes with your lasix  Cut out the  carbos  Please call us if you have new issues that need to be addressed before your next appt.  Your physician wants you to follow-up in: 6 months.  You will receive a reminder letter in the mail two months in advance. If you don't receive a letter, please call our office to schedule the follow-up appointment.

## 2015-08-13 NOTE — Assessment & Plan Note (Signed)
Recent shortness of breath since her discharge likely from acute on chronic diastolic CHF. IV fluids given in the hospital for hypotension and acute renal failure (resolved). Breathing better after taking Lasix daily. Recommended she take Lasix when necessary with potassium for shortness of breath

## 2015-08-13 NOTE — Assessment & Plan Note (Signed)
Currently feels back to her baseline. Recent sepsis causing elevated troponin No workup at this time

## 2015-08-13 NOTE — Assessment & Plan Note (Signed)
We have encouraged continued exercise, careful diet management in an effort to lose weight. 

## 2015-08-13 NOTE — Assessment & Plan Note (Signed)
Prior history of DVT and PE. We have stressed importance of taking her anticoagulation She is high risk of recurrent DVT

## 2015-08-13 NOTE — Progress Notes (Signed)
Patient ID: Sonya Sanchez, female    DOB: [birth year], 59 y.o.   MRN: [id number]  HPI Comments: 59 -year-old woman who lives in [place]  in the winter and [place] the rest of the year, history of morbid obesity, DVT/PE on anticoagulation, diabetes, prior smoking history,  hypertension, strong family history of heart disease with parents dying in their 65s from heart attack  who presents for routine followup after recent hospitalization Prior echocardiogram showed mild aortic valve sclerosis, no significantstenosis, normal LV function. Diabetes is poorly controlled  Recent admission to the hospital September 2016 with bacteremia/sepsis, hypotension, rigors  on admission, She had not taken her medications for least 5 days including her anticoagulation and insulin Found to have severely elevated glucose levels, possible urinary tract infection  Treated with anabiotic  No ischemia workup needed . Mildly elevated troponin felt secondary to demand ischemia from her sepsis .  She presents today and continues on her antibodies, feels much better, continues to have labile sugar level  She reports recent sugar of 500, down to 250 with insulin  Weight continues to be a major issue. Not restricting her diet  She needs to drive back to [place] to take care of business  No regular exercise program Recently took Lasix for leg edema since her discharge from the hospital, improvement of her swelling. Does not take potassium  EKG on today's visit shows normal sinus rhythm with rate 91 bpm, no significant ST or T-wave changes   Other past medical history  vein surgery in 2015, developed MRSA infection requiring long course of antibiotics.  chronic leg edema.   She continues to have chronic lower extremity edema likely from venous insufficiency, exacerbated by obesity.  hemoglobin A1c 9.8,   She reports history of pulmonary embolism in 2006. At that time she had shortness of breath and it felt like  "Bronchitis". Workup in [place] confirmed PE.  Changed from warfarin to Eliquis by physician in [place]  Ultrasound August 2010 for possible cellulitis showed no DVT  Allergies  Allergen Reactions  . Sulfa Antibiotics Other (See Comments)    Reaction: isn't certain, thinks she ran a fever, or had a rash, maybe both.    Outpatient Encounter Prescriptions as of 08/13/2015  Medication Sig  . allopurinol (ZYLOPRIM) 300 MG tablet Take 300 mg by mouth daily.  Marland Kitchen apixaban (ELIQUIS) 5 MG TABS tablet Take 5 mg by mouth 2 (two) times daily.  Marland Kitchen aspirin 81 MG chewable tablet Chew 81 mg by mouth daily.  Marland Kitchen BYDUREON 2 MG PEN 2 mg once a week.   . cefUROXime (CEFTIN) 250 MG tablet Take 1 tablet (250 mg total) by mouth 2 (two) times daily with a meal.  . Cholecalciferol (VITAMIN D) 1000 UNITS capsule Take 2,000 Units by mouth daily.  Marland Kitchen esomeprazole (NEXIUM) 40 MG capsule Take 40 mg by mouth daily before breakfast.  . fenofibrate 54 MG tablet Take 54 mg by mouth daily.  . furosemide (LASIX) 40 MG tablet Take 1 tablet (40 mg total) by mouth daily.  . insulin detemir (LEVEMIR) 100 UNIT/ML injection 40units qam and 25units at bedtime  . insulin lispro (HUMALOG KWIKPEN) 100 UNIT/ML injection Inject 5 Units into the skin 3 (three) times daily before meals.  . metoprolol succinate (TOPROL-XL) 25 MG 24 hr tablet Take 25 mg by mouth daily.  . Multiple Vitamin (MULTIVITAMIN) tablet Take 1 tablet by mouth daily.  . rosuvastatin (CRESTOR) 10 MG tablet Take 10 mg by mouth  daily.  . sitaGLIPtan-metformin (JANUMET) 50-1000 MG per tablet Take 1 tablet by mouth daily.  . valsartan-hydrochlorothiazide (DIOVAN-HCT) 160-12.5 MG per tablet Take 1 tablet by mouth daily.  . potassium chloride (K-DUR) 10 MEQ tablet Take 1 tablet (10 mEq total) by mouth daily as needed.   No facility-administered encounter medications on file as of 08/13/2015.    Past Medical History  Diagnosis Date  . Heart murmur   . Diabetes  mellitus without complication   . Urinary tract infection, site not specified   . Gout, unspecified   . Calculus of kidney   . Unspecified sleep apnea   . Type II or unspecified type diabetes mellitus without mention of complication, uncontrolled   . Morbid obesity   . Phlebitis and thrombophlebitis of other deep vessels of lower extremities   . Pulmonary emboli     a. on Eliquis  . Phlebitis and thrombophlebitis of other deep vessels of lower extremities   . Spinal stenosis, unspecified region other than cervical   . Hyperlipidemia   . Hypertension   . Normal cardiac stress test     a. equivocal study, sig soft tissue artifact present, no chest discomfort or ECG changes, perfusion images suggest mod sized region of mild reversible perfusion defect. Findings may be 2/2 shifting soft tissue attenuation, but cannot r/o ischemia, EF 72%  . DVT (deep venous thrombosis)     a. on Eliquis  . Family history of early CAD     a. parents passing in their 61's from CAD  . Tobacco abuse     Past Surgical History  Procedure Laterality Date  . Abdominal hysterectomy      hyperplasia of endometrium  . Stomach surgery    . Intestinal bypass      ovarian cyst ruptured, led to perforated intestine  . Appendectomy    . Cholecystectomy    . Ankle surgery      fracture s/p pin, right ankle  . Colectomy      temporary colostomy, now reversed    Social History  reports that she quit smoking about 15 years ago. She does not have any smokeless tobacco history on file. She reports that she does not drink alcohol or use illicit drugs.  Family History family history includes Arthritis in her sister; Cancer in her mother; Heart attack (age of onset: 3) in her mother; Heart attack (age of onset: 18) in her father.  Review of Systems  Constitutional: Negative.   Cardiovascular: Negative.   Gastrointestinal: Negative.   Musculoskeletal: Negative.   Neurological: Negative.   Psychiatric/Behavioral:  Negative.   All other systems reviewed and are negative.   BP 108/58 mmHg  Pulse 91  Ht 5\' 2"  (1.575 m)  Wt 301 lb 12 oz (136.873 kg)  BMI 55.18 kg/m2  Physical Exam  Constitutional: She is oriented to person, place, and time. She appears well-developed and well-nourished.  Obese  HENT:  Head: Normocephalic.  Nose: Nose normal.  Mouth/Throat: Oropharynx is clear and moist.  Eyes: Conjunctivae are normal. Pupils are equal, round, and reactive to light.  Neck: Normal range of motion. Neck supple. No JVD present.  Cardiovascular: Normal rate, regular rhythm, S1 normal, S2 normal and intact distal pulses.  Exam reveals no gallop and no friction rub.   Murmur heard.  Crescendo systolic murmur is present with a grade of 1/6  Nonpitting edema lower extremities bilaterally  Pulmonary/Chest: Effort normal and breath sounds normal. No respiratory distress. She has no wheezes. She  has no rales. She exhibits no tenderness.  Abdominal: Soft. Bowel sounds are normal. She exhibits no distension. There is no tenderness.  Musculoskeletal: Normal range of motion. She exhibits no edema or tenderness.  Lymphadenopathy:    She has no cervical adenopathy.  Neurological: She is alert and oriented to person, place, and time. Coordination normal.  Skin: Skin is warm and dry. No rash noted. No erythema.  Skin changes of the lower extremities from chronic swelling/venous insufficiency.  Psychiatric: She has a normal mood and affect. Her behavior is normal. Judgment and thought content normal.    Assessment and Plan  Nursing note and vitals reviewed.

## 2015-08-15 LAB — CULTURE, BLOOD (ROUTINE X 2)
Culture: NO GROWTH
Culture: NO GROWTH

## 2015-08-19 ENCOUNTER — Encounter: Payer: PRIVATE HEALTH INSURANCE | Admitting: Physician Assistant

## 2016-01-08 ENCOUNTER — Ambulatory Visit: Payer: PRIVATE HEALTH INSURANCE | Admitting: Cardiovascular Disease

## 2016-02-20 ENCOUNTER — Telehealth: Payer: Self-pay | Admitting: Cardiovascular Disease

## 2016-02-20 NOTE — Telephone Encounter (Signed)
Patient is a dual resident and has gone back to [place] for the season will not be returning for several months.   Patient wants to be taken off the list to call for a reminder and she will cal us o schedule in [place] when back in [place] again.  Deleting recall.

## 2016-09-29 IMAGING — NM NM PULMONARY VENT & PERF
2 series · 16 of 16 positions shown · non-contrast
Comparison: Chest radiograph August 07, 2015.

CLINICAL DATA: Shortness of breath. History of prior lower
extremity deep venous thrombosis

[Series 1000: lung perfusion · 1.95mm/px · 4 acquisitions, 8 frames shown]
[im 1/4]
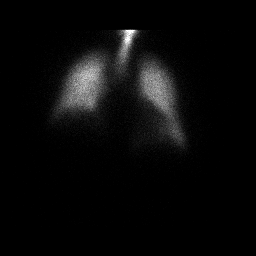
[im 1/4]
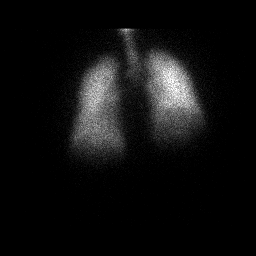
[im 2/4]
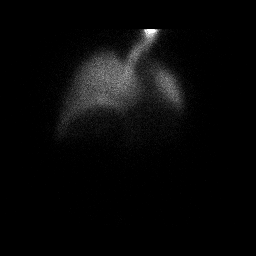
[im 2/4]
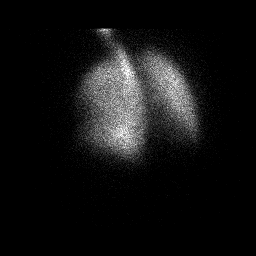
[im 3/4  full-range]
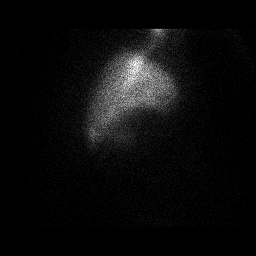
[im 3/4  full-range]
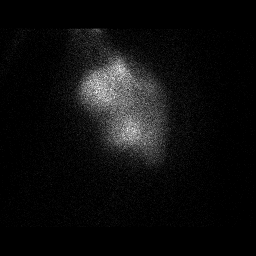
[im 4/4]
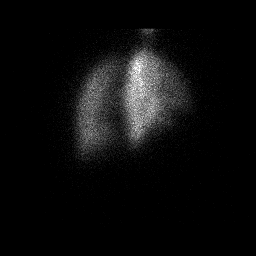
[im 4/4]
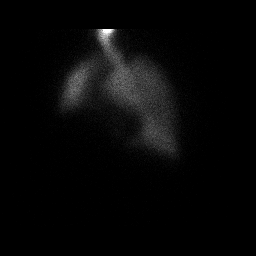

[Series 1000: lung ventilation · 3.90mm/px · 4 acquisitions, 8 frames shown]
[im 1/4]
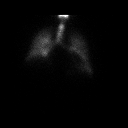
[im 1/4]
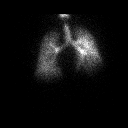
[im 2/4]
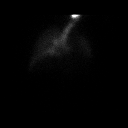
[im 2/4]
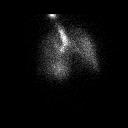
[im 3/4]
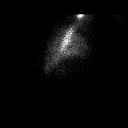
[im 3/4]
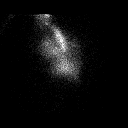
[im 4/4]
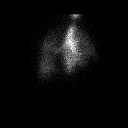
[im 4/4]
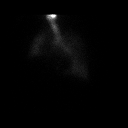

[16 of 16 positions shown; findings below may reference images not displayed]

EXAM:
NUCLEAR MEDICINE VENTILATION - PERFUSION LUNG SCAN

Views: Anterior, posterior, left lateral, right lateral, RPO, LPO,
RAO, LAO -ventilation and perfusion

Radionuclide: Technetium 99m DTPA -ventilation ; Technetium 99m
macroaggregated albumin-perfusion

Dose:  44.4 mCi-ventilation; 4.3 mCi-perfusion

Route of administration: Inhalation -ventilation ; intravenous
-perfusion
FINDINGS: Ventilation: Radiotracer uptake is homogeneous and symmetric
bilaterally.

Perfusion: Radiotracer uptake is homogeneous and symmetric
bilaterally.
IMPRESSION: There are no appreciable ventilation or perfusion defects. This
study constitutes a very low probability of pulmonary embolus.

## 2016-11-30 ENCOUNTER — Telehealth: Payer: Self-pay | Admitting: Cardiovascular Disease

## 2016-11-30 NOTE — Telephone Encounter (Signed)
Patient scheduled with [facility] for overdue 6 m fu.  Patient wants to know if he will order a UA as she thinks she may have a UTI.

## 2016-11-30 NOTE — Telephone Encounter (Signed)
Spoke w/ pt.  Advised her that she will need to speak w/ her PCP regarding urinary sx.  She verbalizes understanding and will call now.

## 2016-12-02 ENCOUNTER — Ambulatory Visit (INDEPENDENT_AMBULATORY_CARE_PROVIDER_SITE_OTHER): Payer: PRIVATE HEALTH INSURANCE | Admitting: Cardiovascular Disease

## 2016-12-02 ENCOUNTER — Encounter: Payer: Self-pay | Admitting: Cardiovascular Disease

## 2016-12-02 VITALS — BP 122/72 | HR 82 | Ht 61.5 in | Wt 311.5 lb

## 2016-12-02 DIAGNOSIS — R0602 Shortness of breath: Secondary | ICD-10-CM | POA: Diagnosis not present

## 2016-12-02 DIAGNOSIS — I2699 Other pulmonary embolism without acute cor pulmonale: Secondary | ICD-10-CM | POA: Diagnosis not present

## 2016-12-02 DIAGNOSIS — E785 Hyperlipidemia, unspecified: Secondary | ICD-10-CM

## 2016-12-02 DIAGNOSIS — I10 Essential (primary) hypertension: Secondary | ICD-10-CM

## 2016-12-02 DIAGNOSIS — I350 Nonrheumatic aortic (valve) stenosis: Secondary | ICD-10-CM

## 2016-12-02 DIAGNOSIS — E1159 Type 2 diabetes mellitus with other circulatory complications: Secondary | ICD-10-CM

## 2016-12-02 DIAGNOSIS — E1165 Type 2 diabetes mellitus with hyperglycemia: Secondary | ICD-10-CM

## 2016-12-02 NOTE — Patient Instructions (Addendum)
Medication Instructions:   Please stop the aspirin  Labwork:  We will check liver, lipids, BMP today  Testing/Procedures:  No further testing at this time   I recommend watching educational videos on topics of interest to you at:       www.goemmi.com  Enter code: HEARTCARE    Follow-Up: It was a pleasure seeing you in the office today. Please call us if you have new issues that need to be addressed before your next appt.  [phone]  Your physician wants you to follow-up in: 6 months.  You will receive a reminder letter in the mail two months in advance. If you don't receive a letter, please call our office to schedule the follow-up appointment.  If you need a refill on your cardiac medications before your next appointment, please call your pharmacy.

## 2016-12-02 NOTE — Progress Notes (Signed)
Cardiology Office Note  Date:  12/02/2016   ID:  Sonya Sanchez, DOB Mar 23, 1956, MRN [id number]  PCP:  Wynona Dove, MD   Chief Complaint  Patient presents with  . other    Overdue 15mo f/u. Pt c/o sob. Reviewed meds with pt verbally.    HPI:  60 -year-old woman who lives in [place]  in the winter and [place] the rest of the year, history of morbid obesity, DVT/PE on anticoagulation, diabetes, prior smoking history,  hypertension, strong family history of heart disease with parents dying in their 45s from heart attack  who presents for routine followup after recent hospitalization Prior echocardiogram showed mild aortic valve sclerosis, no significantstenosis, normal LV function. Diabetes is poorly controlled Stopped smoking 20 years ago  In follow-up today she reports that she is doing well Significant dietary noncompliance, will eat lots of desserts, bread Sees endocrine up in [place] Hemoglobin A1c of 13 two weeks ago  On aspirin as well as eliquis  Blood in urine seems to come and go   takes Lasix daily with potassium 3x per week Denies any PND, orthopnea, worsening leg swelling abdominal bloating or weight change Some shortness of breath when walking up hills  EKG on today's visit shows normal sinus rhythm with rate 82 bpm, no significant ST or T-wave changes  Other past medical history reviewed  admission to the hospital September 2016 with bacteremia/sepsis, hypotension, rigors  on admission, She had not taken her medications for least 5 days including her anticoagulation and insulin Found to have severely elevated glucose levels, possible urinary tract infection  Treated with anabiotic   Mildly elevated troponin felt secondary to demand ischemia from her sepsis .  Other past medical history  vein surgery in 2015, developed MRSA infection requiring long course of antibiotics.  chronic leg edema.   chronic lower extremity edema likely from venous  insufficiency, exacerbated by obesity.  history of pulmonary embolism in 2006.  she had shortness of breath and it felt like "Bronchitis". Workup in [place] confirmed PE.  Changed from warfarin to Eliquis by physician in [place]  Ultrasound August 2010 for possible cellulitis showed no DVT   PMH:   has a past medical history of Calculus of kidney; Diabetes mellitus without complication (HCC); DVT (deep venous thrombosis) (HCC); Family history of early CAD; Gout, unspecified; Heart murmur; Hyperlipidemia; Hypertension; Morbid obesity (HCC); Normal cardiac stress test; Phlebitis and thrombophlebitis of other deep vessels of lower extremities; Phlebitis and thrombophlebitis of other deep vessels of lower extremities; Pulmonary emboli (HCC); Spinal stenosis, unspecified region other than cervical; Tobacco abuse; Type II or unspecified type diabetes mellitus without mention of complication, uncontrolled; Unspecified sleep apnea; and Urinary tract infection, site not specified.  PSH:    Past Surgical History:  Procedure Laterality Date  . ABDOMINAL HYSTERECTOMY     hyperplasia of endometrium  . ANKLE SURGERY     fracture s/p pin, right ankle  . APPENDECTOMY    . CHOLECYSTECTOMY    . COLECTOMY     temporary colostomy, now reversed  . INTESTINAL BYPASS     ovarian cyst ruptured, led to perforated intestine  . STOMACH SURGERY      Current Outpatient Prescriptions  Medication Sig Dispense Refill  . allopurinol (ZYLOPRIM) 300 MG tablet Take 300 mg by mouth daily.    Sonya Kitchen apixaban (ELIQUIS) 5 MG TABS tablet Take 5 mg by mouth 2 (two) times daily.    . cefUROXime (CEFTIN) 250 MG tablet Take  1 tablet (250 mg total) by mouth 2 (two) times daily with a meal. 20 tablet 0  . celecoxib (CELEBREX) 50 MG capsule Take 50 mg by mouth 2 (two) times daily.    . Cholecalciferol (VITAMIN D) 1000 UNITS capsule Take 2,000 Units by mouth daily.    Sonya Kitchen esomeprazole (NEXIUM) 40 MG capsule Take 40 mg by mouth  daily before breakfast.    . fenofibrate 54 MG tablet Take 54 mg by mouth daily.    . furosemide (LASIX) 40 MG tablet Take 1 tablet (40 mg total) by mouth daily. 30 tablet 0  . Insulin Aspart (NOVOLOG FLEXPEN [place]) Inject 20 Units into the skin 2 (two) times daily.    . Insulin Degludec (TRESIBA FLEXTOUCH [place]) Inject 150 Units into the skin daily.    . metoprolol succinate (TOPROL-XL) 25 MG 24 hr tablet Take 25 mg by mouth daily.    . Multiple Vitamin (MULTIVITAMIN) tablet Take 1 tablet by mouth daily.    . potassium chloride (K-DUR) 10 MEQ tablet Take 1 tablet (10 mEq total) by mouth daily as needed. 30 tablet 6  . rosuvastatin (CRESTOR) 10 MG tablet Take 10 mg by mouth daily.    . sitaGLIPtan-metformin (JANUMET) 50-1000 MG per tablet Take 1 tablet by mouth daily.    . valsartan-hydrochlorothiazide (DIOVAN-HCT) 160-12.5 MG per tablet Take 1 tablet by mouth daily.     No current facility-administered medications for this visit.      Allergies:   Sulfa antibiotics   Social History:  The patient  reports that she quit smoking about 16 years ago. She has a 10.00 pack-year smoking history. She has never used smokeless tobacco. She reports that she drinks alcohol. She reports that she does not use drugs.   Family History:   family history includes Arthritis in her sister; Cancer in her mother; Heart attack (age of onset: 39) in her mother; Heart attack (age of onset: 47) in her father.    Review of Systems: Review of Systems  Constitutional: Negative.   Respiratory: Positive for shortness of breath.   Cardiovascular: Negative.   Gastrointestinal: Negative.   Musculoskeletal: Negative.   Neurological: Negative.   Psychiatric/Behavioral: Negative.   All other systems reviewed and are negative.    PHYSICAL EXAM: VS:  BP 122/72 (BP Location: Left Arm, Patient Position: Sitting, Cuff Size: Large)   Pulse 82   Ht 5' 1.5" (1.562 m)   Wt (!) 311 lb 8 oz (141.3 kg)   BMI 57.90 kg/m  , BMI  Body mass index is 57.9 kg/m. GEN: Well nourished, well developed, in no acute distress  HEENT: normal  Neck: no JVD, carotid bruits, or masses Cardiac: RRR; I/VI SEM RSB,  no rubs, or gallops,no edema  Respiratory:  clear to auscultation bilaterally, normal work of breathing GI: soft, nontender, nondistended, + BS MS: no deformity or atrophy  Skin: warm and dry, no rash Neuro:  Strength and sensation are intact Psych: euthymic mood, full affect    Recent Labs: No results found for requested labs within last 8760 hours.    Lipid Panel Lab Results  Component Value Date   CHOL 167 08/03/2013   HDL 30.30 (L) 08/03/2013   LDLCALC 104 (H) 08/03/2013   TRIG 162.0 (H) 08/03/2013      Wt Readings from Last 3 Encounters:  12/02/16 (!) 311 lb 8 oz (141.3 kg)  08/13/15 (!) 301 lb 12 oz (136.9 kg)  08/11/15 (!) 305 lb 12.8 oz (138.7 kg)  ASSESSMENT AND PLAN:  Aortic valve stenosis, etiology of cardiac valve disease unspecified -  Murmur on exam consistent with aortic valve sclerosis without significant stenosis No further testing at this time Previous echocardiogram approximately 2 years ago  SOB (shortness of breath) -  Shortness of breath a pills likely from deconditioning Recommended weight loss, exercise program  Hyperlipidemia, unspecified hyperlipidemia type -  Currently on Crestor Fasting lab work done this morning in the office  Other pulmonary embolism without acute cor pulmonale, unspecified chronicity (HCC) Recommended she stay on anticoagulation, high risk of DVT and PE  Essential hypertension Blood pressure relatively stable, no medication changes made  Poorly controlled type 2 diabetes mellitus with circulatory disorder (HCC) Major issue is poorly controlled diabetes secondary to dietary no discretion, obesity She does have endocrinology in [place] Last hemoglobin A1c 13  Severe obesity (BMI >= 40) (HCC) We have encouraged continued exercise,  careful diet management in an effort to lose weight.   Total encounter time more than 25 minutes  Greater than 50% was spent in counseling and coordination of care with the patient   Disposition:   F/U  6 months   Orders Placed This Encounter  Procedures  . Hepatic function panel  . Lipid Profile  . Basic Metabolic Panel (BMET)  . EKG 12-Lead     Signed, Dossie Arbourim Joshva Labreck, M.D., Ph.D. 12/02/2016  [facility] [place] [phone]

## 2016-12-03 LAB — BASIC METABOLIC PANEL
BUN / CREAT RATIO: 22 (ref 12–28)
BUN: 23 mg/dL (ref 8–27)
CHLORIDE: 95 mmol/L — AB (ref 96–106)
CO2: 24 mmol/L (ref 18–29)
Calcium: 9.5 mg/dL (ref 8.7–10.3)
Creatinine, Ser: 1.03 mg/dL — ABNORMAL HIGH (ref 0.57–1.00)
GFR calc non Af Amer: 58 mL/min/{1.73_m2} — ABNORMAL LOW (ref >59)
GFR, EST AFRICAN AMERICAN: 67 mL/min/{1.73_m2} (ref >59)
Glucose: 271 mg/dL — ABNORMAL HIGH (ref 65–99)
POTASSIUM: 4.7 mmol/L (ref 3.5–5.2)
Sodium: 137 mmol/L (ref 134–144)

## 2016-12-03 LAB — HEPATIC FUNCTION PANEL
ALBUMIN: 3.8 g/dL (ref 3.6–4.8)
ALK PHOS: 54 IU/L (ref 39–117)
ALT: 7 IU/L (ref 0–32)
AST: 10 IU/L (ref 0–40)
Bilirubin Total: 0.3 mg/dL (ref 0.0–1.2)
Bilirubin, Direct: 0.11 mg/dL (ref 0.00–0.40)
TOTAL PROTEIN: 7.3 g/dL (ref 6.0–8.5)

## 2016-12-03 LAB — LIPID PANEL
CHOL/HDL RATIO: 4.9 ratio — AB (ref 0.0–4.4)
Cholesterol, Total: 188 mg/dL (ref 100–199)
HDL: 38 mg/dL — AB (ref >39)
LDL CALC: 96 mg/dL (ref 0–99)
TRIGLYCERIDES: 272 mg/dL — AB (ref 0–149)
VLDL Cholesterol Cal: 54 mg/dL — ABNORMAL HIGH (ref 5–40)

## 2016-12-06 ENCOUNTER — Other Ambulatory Visit: Payer: Self-pay

## 2016-12-06 MED ORDER — ROSUVASTATIN CALCIUM 20 MG PO TABS: 20.0000 mg | ORAL_TABLET | Freq: Every day | ORAL | 3 refills | Status: DC

## 2016-12-08 ENCOUNTER — Ambulatory Visit (INDEPENDENT_AMBULATORY_CARE_PROVIDER_SITE_OTHER): Payer: PRIVATE HEALTH INSURANCE | Admitting: Family Medicine

## 2016-12-08 ENCOUNTER — Ambulatory Visit (INDEPENDENT_AMBULATORY_CARE_PROVIDER_SITE_OTHER): Payer: PRIVATE HEALTH INSURANCE

## 2016-12-08 VITALS — BP 154/82 | HR 91 | Temp 98.5°F | Resp 16 | Wt 321.4 lb

## 2016-12-08 DIAGNOSIS — R05 Cough: Secondary | ICD-10-CM | POA: Diagnosis not present

## 2016-12-08 DIAGNOSIS — N3001 Acute cystitis with hematuria: Secondary | ICD-10-CM | POA: Diagnosis not present

## 2016-12-08 DIAGNOSIS — E1159 Type 2 diabetes mellitus with other circulatory complications: Secondary | ICD-10-CM

## 2016-12-08 DIAGNOSIS — R059 Cough, unspecified: Secondary | ICD-10-CM | POA: Insufficient documentation

## 2016-12-08 DIAGNOSIS — R300 Dysuria: Secondary | ICD-10-CM

## 2016-12-08 DIAGNOSIS — E1165 Type 2 diabetes mellitus with hyperglycemia: Secondary | ICD-10-CM | POA: Diagnosis not present

## 2016-12-08 DIAGNOSIS — G4733 Obstructive sleep apnea (adult) (pediatric): Secondary | ICD-10-CM | POA: Insufficient documentation

## 2016-12-08 LAB — POCT URINALYSIS DIP (MANUAL ENTRY)
BILIRUBIN UA: NEGATIVE
Nitrite, UA: NEGATIVE
Protein Ur, POC: >=300 — AB
Spec Grav, UA: 1.025
Urobilinogen, UA: 0.2
pH, UA: 5.5

## 2016-12-08 LAB — HEMOGLOBIN A1C

## 2016-12-08 LAB — BRAIN NATRIURETIC PEPTIDE: Pro B Natriuretic peptide (BNP): 17 pg/mL (ref 0.0–100.0)

## 2016-12-08 MED ORDER — ALBUTEROL SULFATE HFA 108 (90 BASE) MCG/ACT IN AERS: 2.0000 | INHALATION_SPRAY | Freq: Four times a day (QID) | RESPIRATORY_TRACT | 0 refills | Status: DC | PRN

## 2016-12-08 MED ORDER — LEVOFLOXACIN 500 MG PO TABS: 500.0000 mg | ORAL_TABLET | Freq: Every day | ORAL | 0 refills | Status: DC

## 2016-12-08 NOTE — Progress Notes (Signed)
Pre visit review using our clinic review tool, if applicable. No additional management support is needed unless otherwise documented below in the visit note. 

## 2016-12-08 NOTE — Assessment & Plan Note (Signed)
New problem. Urinalysis with large blood and trace leukocytes. Obtaining culture. Given symptoms and urine findings, treating empirically for UTI while awaiting culture. Treating with Levaquin.

## 2016-12-08 NOTE — Assessment & Plan Note (Signed)
New acute problem. Patient with  URI symptoms as well as cough and wheezing. Chest x-ray today. Treating with Levaquin as his covers for UTI as well as community acquired pneumonia.  Albuterol as needed

## 2016-12-08 NOTE — Patient Instructions (Signed)
Antibiotic as prescribed.  We will call with your lab results and xray.  Take care  Dr. Adriana Simasook

## 2016-12-08 NOTE — Progress Notes (Signed)
Subjective:  Patient ID: Sonya Sanchez, female    DOB: 1956-05-18  Age: 61 y.o. MRN: [id number]  CC: Cough, ? UTI  HPI:  61 year old female with a complex past medical history including uncontrolled type 2 diabetes, prior DVT/PE on chronic anticoagulation, morbid obesity, OSA, hypertension, hyperlipidemia presents with the above complaints.  Note - patient resides in [place] and is in [place] for a few months of the year. Her primary care physician & endocrinologist are in [place]. I do not manage her diabetes or other chronic illnesses. We essentially serve her acute needs while in [place].  UTI  Patient is concerned that she has UTI.  She states that for the past few weeks she's had an increase in frequency. She's also had trace amounts of blood in her urine.  She also states she's had dark urine.  No reports of dysuria.  No back pain or flank pain. No abdominal pain.  No known exacerbating or relieving factors.  She has some baseline significant frequency due to uncontrolled diabetes.  Cough, URI symptoms  Patient reports that she's not been well since Sunday.  She's had runny nose, congestion.  She's also developed cough and wheezing. Worse at night.  She reports chills. No reports of documented fever.  She also reports increasing shortness of breath. She has some baseline shortness of breath due to deconditioning and morbid obesity.  She has had sick contacts.  No known exacerbating or relieving factors.  No other complaints or concerns at this time.  Social Hx   Social History   Social History  . Marital status: Single    Spouse name: N/A  . Number of children: N/A  . Years of education: N/A   Social History Main Topics  . Smoking status: Former Smoker    Packs/day: 1.00    Years: 10.00    Quit date: 12/26/1999  . Smokeless tobacco: Never Used  . Alcohol use Yes     Comment: socially  . Drug use: No  . Sexual activity: Not on file    Other Topics Concern  . Not on file   Social History Narrative   Lives in [place], [place] summer, [place] Dec-Mar.  Sister and children live here in [place]      Work - Retired Designer, television/film setlegislative aid      Diet - regular      Exercise - walks, limited by knee and ankle pain    Review of Systems  Constitutional: Positive for chills.  HENT: Positive for congestion and rhinorrhea.   Respiratory: Positive for cough, shortness of breath and wheezing.   Genitourinary: Positive for frequency and hematuria.   Objective:  BP (!) 154/82   Pulse 91   Temp 98.5 F (36.9 C) (Oral)   Resp 16   Wt (!) 321 lb 6.4 oz (145.8 kg)   SpO2 95%   BMI 59.74 kg/m   BP/Weight 12/08/2016 12/02/2016 08/13/2015  Systolic BP 154 122 108  Diastolic BP 82 72 58  Wt. (Lbs) 321.4 311.5 301.75  BMI 59.74 57.9 55.18   Physical Exam  Constitutional: She is oriented to person, place, and time. No distress.  Morbidly obese female.  Cardiovascular: Normal rate and regular rhythm.   Pulmonary/Chest:  Mild increased work of breathing. Diffuse expiratory wheezing.  Abdominal: Soft. She exhibits no distension. There is no tenderness.  Neurological: She is alert and oriented to person, place, and time.  Psychiatric: She has a normal mood and affect.  Vitals  reviewed.  Lab Results  Component Value Date   WBC 8.7 08/09/2015   HGB 12.0 08/11/2015   HCT 34.3 (L) 08/09/2015   PLT 122 (L) 08/09/2015   GLUCOSE 271 (H) 12/02/2016   CHOL 188 12/02/2016   TRIG 272 (H) 12/02/2016   HDL 38 (L) 12/02/2016   LDLDIRECT 138.5 02/13/2013   LDLCALC 96 12/02/2016   ALT 7 12/02/2016   AST 10 12/02/2016   NA 137 12/02/2016   K 4.7 12/02/2016   CL 95 (L) 12/02/2016   CREATININE 1.03 (H) 12/02/2016   BUN 23 12/02/2016   CO2 24 12/02/2016   INR 1.1 03/21/2013   HGBA1C 15.3 (H) 08/08/2015    Assessment & Plan:   Problem List Items Addressed This Visit    Poorly controlled type 2 diabetes mellitus with circulatory disorder  (HCC)   Relevant Medications   JANUMET 50-500 MG tablet   Other Relevant Orders   Hemoglobin A1c   Cough    New acute problem. Patient with  URI symptoms as well as cough and wheezing. Chest x-ray today. Treating with Levaquin as his covers for UTI as well as community acquired pneumonia.  Albuterol as needed      Relevant Orders   B Nat Peptide   DG Chest 2 View   Acute cystitis with hematuria - Primary    New problem. Urinalysis with large blood and trace leukocytes. Obtaining culture. Given symptoms and urine findings, treating empirically for UTI while awaiting culture. Treating with Levaquin.      Relevant Orders   POCT urinalysis dipstick (Completed)   Urine Culture      Meds ordered this encounter  Medications  . JANUMET 50-500 MG tablet    Sig: TK 1 T PO BID    Refill:  1  . levofloxacin (LEVAQUIN) 500 MG tablet    Sig: Take 1 tablet (500 mg total) by mouth daily.    Dispense:  7 tablet    Refill:  0  . albuterol (PROVENTIL HFA;VENTOLIN HFA) 108 (90 Base) MCG/ACT inhaler    Sig: Inhale 2 puffs into the lungs every 6 (six) hours as needed for wheezing or shortness of breath.    Dispense:  1 Inhaler    Refill:  0    Follow-up: PRN  Everlene Other DO [facility]

## 2016-12-10 LAB — URINE CULTURE: ORGANISM ID, BACTERIA: NO GROWTH

## 2016-12-20 ENCOUNTER — Telehealth: Payer: Self-pay | Admitting: Family Medicine

## 2016-12-20 NOTE — Telephone Encounter (Signed)
Coughing since 12/08/16 patient stated she cough up Green to yellow mucus has wheezing and congestion patient completed Levaquin for UTI on 12/15/16, but congestion , cough and wheezing remain tried to offer appointment patient refused., patient stated that she is afebrile but has some body aches in her legs especially, patient recently exposed to Flu  But has had flu shot in [place], patient stated she was in same house with sister whom was DX with flu last week. Patient asking for antibiotics.

## 2016-12-20 NOTE — Telephone Encounter (Signed)
Patient has been scheduled

## 2016-12-20 NOTE — Telephone Encounter (Signed)
She will have to be seen

## 2016-12-20 NOTE — Telephone Encounter (Signed)
Pt called and stated that she saw Dr. Adriana Simasook on 1/10 for a uti and uri. Pt still c/o coughing, congestion, a lot of mucous. Please advise, thank you!  Call pt @ [phone]

## 2016-12-22 ENCOUNTER — Ambulatory Visit (INDEPENDENT_AMBULATORY_CARE_PROVIDER_SITE_OTHER): Payer: PRIVATE HEALTH INSURANCE | Admitting: Family Medicine

## 2016-12-22 ENCOUNTER — Encounter: Payer: Self-pay | Admitting: Family Medicine

## 2016-12-22 DIAGNOSIS — J40 Bronchitis, not specified as acute or chronic: Secondary | ICD-10-CM | POA: Diagnosis not present

## 2016-12-22 MED ORDER — HYDROCOD POLST-CPM POLST ER 10-8 MG/5ML PO SUER: 5.0000 mL | Freq: Two times a day (BID) | ORAL | 0 refills | Status: DC | PRN

## 2016-12-22 NOTE — Assessment & Plan Note (Signed)
Established problem, persistent/worsening. Patient was recently treated with Levaquin. Chest x-ray was negative. She continues to have symptoms. No focal lung findings to suggest that she needs an additional chest x-ray. I suspect that this is a viral respiratory infection. I also suspect that she has a likely underlying component of lung disease given morbid obesity and prior smoking history. I cannot treat her with steroids given her severely elevated A1c. Continue albuterol. Tussionex for cough. If she continues to have symptoms, will need to see pulmonology.

## 2016-12-22 NOTE — Patient Instructions (Signed)
Cough medicine as prescribed.  If you continue to have shortness of breath and symptoms, we will send you to pulmonology.  Take care  Dr. Adriana Simasook

## 2016-12-22 NOTE — Progress Notes (Signed)
Subjective:  Patient ID: Sonya Sanchez, female    DOB: 09/05/1956  Age: 61 y.o. MRN: [id number]  CC: Cough, congestion  HPI:  61 year old female with an extensive past medical history including uncontrolled diabetes and morbid obesity presents with the above complaint.  Patient was recent seen on 1/10. At that time she was treated empirically for UTI. She also had cough and congestion at that time.  Patient has finished her antibody course of Levaquin. She states that she had some improvement, but continues to have significant cough and congestion. Cough is mildly productive. Associated shortness of breath and wheezing. No reported fever. She's using albuterol as well. No known exacerbating factors. No other associated symptoms. No other complaints or concerns at this time. Of note, she does have a smoking history.  Social Hx   Social History   Social History  . Marital status: Single    Spouse name: N/A  . Number of children: N/A  . Years of education: N/A   Social History Main Topics  . Smoking status: Former Smoker    Packs/day: 1.00    Years: 10.00    Quit date: 12/26/1999  . Smokeless tobacco: Never Used  . Alcohol use Yes     Comment: socially  . Drug use: No  . Sexual activity: Not Asked   Other Topics Concern  . None   Social History Narrative   Lives in [place], [place] summer, [place] Dec-Mar.  Sister and children live here in [place]      Work - Retired Designer, television/film set      Diet - regular      Exercise - walks, limited by knee and ankle pain   Review of Systems  HENT: Positive for congestion.   Respiratory: Positive for cough, shortness of breath and wheezing.    Objective:  BP (!) 184/78   Pulse (!) 108   Temp 98.3 F (36.8 C) (Oral)   Resp 18   Wt (!) 322 lb 12.8 oz (146.4 kg)   SpO2 91%   BMI 60.00 kg/m   BP/Weight 12/22/2016 12/08/2016 12/02/2016  Systolic BP 184 154 122  Diastolic BP 78 82 72  Wt. (Lbs) 322.8 321.4 311.5  BMI 60 59.74 57.9    Physical Exam  Constitutional: She is oriented to person, place, and time. She appears well-developed. No distress.  HENT:  Mouth/Throat: Oropharynx is clear and moist.  Cardiovascular: Normal rate and regular rhythm.   2/6 systolic murmur.  Pulmonary/Chest: Effort normal.  Scattered wheezing (particularly R sided).   Neurological: She is alert and oriented to person, place, and time.  Psychiatric: She has a normal mood and affect.  Vitals reviewed.  Lab Results  Component Value Date   WBC 8.7 08/09/2015   HGB 12.0 08/11/2015   HCT 34.3 (L) 08/09/2015   PLT 122 (L) 08/09/2015   GLUCOSE 271 (H) 12/02/2016   CHOL 188 12/02/2016   TRIG 272 (H) 12/02/2016   HDL 38 (L) 12/02/2016   LDLDIRECT 138.5 02/13/2013   LDLCALC 96 12/02/2016   ALT 7 12/02/2016   AST 10 12/02/2016   NA 137 12/02/2016   K 4.7 12/02/2016   CL 95 (L) 12/02/2016   CREATININE 1.03 (H) 12/02/2016   BUN 23 12/02/2016   CO2 24 12/02/2016   INR 1.1 03/21/2013   HGBA1C 13.9 Repeated and verified X2. (H) 12/08/2016    Assessment & Plan:   Problem List Items Addressed This Visit    Bronchitis    Established problem,  persistent/worsening. Patient was recently treated with Levaquin. Chest x-ray was negative. She continues to have symptoms. No focal lung findings to suggest that she needs an additional chest x-ray. I suspect that this is a viral respiratory infection. I also suspect that she has a likely underlying component of lung disease given morbid obesity and prior smoking history. I cannot treat her with steroids given her severely elevated A1c. Continue albuterol. Tussionex for cough. If she continues to have symptoms, will need to see pulmonology.         Meds ordered this encounter  Medications  . chlorpheniramine-HYDROcodone (TUSSIONEX PENNKINETIC ER) 10-8 MG/5ML SUER    Sig: Take 5 mLs by mouth every 12 (twelve) hours as needed.    Dispense:  115 mL    Refill:  0    Follow-up:  PRN  Everlene OtherJayce Dwaine Pringle DO [facility]

## 2016-12-22 NOTE — Progress Notes (Signed)
Pre visit review using our clinic review tool, if applicable. No additional management support is needed unless otherwise documented below in the visit note. 

## 2017-01-08 ENCOUNTER — Other Ambulatory Visit: Payer: Self-pay | Admitting: Family Medicine

## 2018-01-05 NOTE — Progress Notes (Signed)
Cardiology Office Note  Date:  01/09/2018   ID:  Sonya Sanchez, DOB Jul 26, 1956, MRN [id number]  PCP:  Tommie Sams, DO   Chief Complaint  Patient presents with  . other    1 year follow up. Pt. c/o chest fluttering with occas. shortness of breath. Meds reviewed by the pt. verbally.     HPI:  62 -year-old woman who lives in [place]  in the winter and [place] the rest of the year, history of  morbid obesity,  DVT/PE on anticoagulation,  diabetes,  prior smoking history,   hypertension,  strong family history of heart disease with parents dying in their 59s from heart attack Prior echocardiogram showed mild aortic valve sclerosis, no significant stenosis, normal LV function. Diabetes is poorly controlled, HBA1C 13.9 Stopped smoking 20 years ago  who presents for routine followup after recent hospitalization  In follow-up today she reports that she is doing well Still with poor diet but trying to eat better living with her sister Sees endocrine up in [place] Hemoglobin A1c of 13 last year, no recent numbers available  Has not seen primary care since Dr. Adriana Simas left  Taking her Eliquis  Rarely takes Lasix and potassium  for leg swelling Urinary issue, tries not to overdo it  Denies any PND, orthopnea, worsening leg swelling abdominal bloating or weight change Some shortness of breath when walking No chest pain on exertion  Tremendous difficulty getting around, chronic knee pain Previously seen by orthopedics up in [place]  EKG on today's visit shows normal sinus rhythm with rate 89 bpm, no significant ST or T-wave changes  Other past medical history reviewed  admission to the hospital September 2016 with bacteremia/sepsis, hypotension, rigors  on admission, She had not taken her medications for least 5 days including her anticoagulation and insulin Found to have severely elevated glucose levels, possible urinary tract infection  Treated with anabiotic   Mildly  elevated troponin felt secondary to demand ischemia from her sepsis .  Other past medical history  vein surgery in 2015, developed MRSA infection requiring long course of antibiotics.  chronic leg edema.   chronic lower extremity edema likely from venous insufficiency, exacerbated by obesity.  history of pulmonary embolism in 2006.  she had shortness of breath and it felt like "Bronchitis". Workup in [place] confirmed PE.  Changed from warfarin to Eliquis by physician in [place]  Ultrasound August 2010 for possible cellulitis showed no DVT   PMH:   has a past medical history of Calculus of kidney, Diabetes mellitus without complication (HCC), DVT (deep venous thrombosis) (HCC), Family history of early CAD, Gout, unspecified, Heart murmur, Hyperlipidemia, Hypertension, Morbid obesity (HCC), Normal cardiac stress test, Phlebitis and thrombophlebitis of other deep vessels of lower extremities, Phlebitis and thrombophlebitis of other deep vessels of lower extremities, Pulmonary emboli (HCC), Spinal stenosis, unspecified region other than cervical, Tobacco abuse, Type II or unspecified type diabetes mellitus without mention of complication, uncontrolled, Unspecified sleep apnea, and Urinary tract infection, site not specified.  PSH:    Past Surgical History:  Procedure Laterality Date  . ABDOMINAL HYSTERECTOMY     hyperplasia of endometrium  . ANKLE SURGERY     fracture s/p pin, right ankle  . APPENDECTOMY    . CHOLECYSTECTOMY    . COLECTOMY     temporary colostomy, now reversed  . INTESTINAL BYPASS     ovarian cyst ruptured, led to perforated intestine  . STOMACH SURGERY  Current Outpatient Medications  Medication Sig Dispense Refill  . allopurinol (ZYLOPRIM) 300 MG tablet Take 300 mg by mouth daily.    Marland Kitchen. apixaban (ELIQUIS) 5 MG TABS tablet Take 1 tablet (5 mg total) by mouth 2 (two) times daily. 60 tablet 11  . celecoxib (CELEBREX) 50 MG capsule Take 50 mg by mouth 2  (two) times daily.    . Cholecalciferol (VITAMIN D) 1000 UNITS capsule Take 2,000 Units by mouth daily.    Marland Kitchen. esomeprazole (NEXIUM) 40 MG capsule Take 40 mg by mouth daily before breakfast.    . fenofibrate 54 MG tablet Take 54 mg by mouth daily.    . furosemide (LASIX) 40 MG tablet Take 1 tablet (40 mg total) by mouth daily. 30 tablet 11  . Insulin Aspart (NOVOLOG FLEXPEN McKenney) Inject 20 Units into the skin 2 (two) times daily.    . Insulin Degludec (TRESIBA FLEXTOUCH Aubrey) Inject 150 Units into the skin daily.    Marland Kitchen. JANUMET 50-500 MG tablet TK 1 T PO BID  1  . metoprolol succinate (TOPROL-XL) 25 MG 24 hr tablet Take 1 tablet (25 mg total) by mouth daily. 30 tablet 11  . Multiple Vitamin (MULTIVITAMIN) tablet Take 1 tablet by mouth daily.    Marland Kitchen. OZEMPIC 1 MG/DOSE SOPN INJECT 1 MG SUBCUNTANEOUSLY ONCE A WEEK  0  . potassium chloride (K-DUR) 10 MEQ tablet Take 1 tablet (10 mEq total) by mouth daily as needed (Daily with Lasix (Furosemide)). 30 tablet 11  . PROAIR HFA 108 (90 Base) MCG/ACT inhaler INHALE 2 PUFFS INTO THE LUNGS EVERY 6 HOURS AS NEEDED FOR WHEEZING OR SHORTNESS OF BREATH 8.5 g 0  . rosuvastatin (CRESTOR) 20 MG tablet Take 1 tablet (20 mg total) by mouth daily. 30 tablet 11  . telmisartan-hydrochlorothiazide (MICARDIS HCT) 80-12.5 MG tablet Take 1 tablet by mouth daily. 30 tablet 11   No current facility-administered medications for this visit.      Allergies:   Sulfa antibiotics   Social History:  The patient  reports that she quit smoking about 18 years ago. She has a 10.00 pack-year smoking history. she has never used smokeless tobacco. She reports that she drinks alcohol. She reports that she does not use drugs.   Family History:   family history includes Arthritis in her sister; Cancer in her mother; Heart attack (age of onset: 1652) in her mother; Heart attack (age of onset: 6659) in her father.    Review of Systems: Review of Systems  Constitutional: Negative.   Respiratory:  Positive for shortness of breath.   Cardiovascular: Positive for leg swelling.  Gastrointestinal: Negative.   Musculoskeletal: Positive for joint pain.  Neurological: Negative.   Psychiatric/Behavioral: Negative.   All other systems reviewed and are negative.    PHYSICAL EXAM: VS:  BP 140/80 (BP Location: Right Arm, Patient Position: Sitting, Cuff Size: Large)   Pulse 89   Ht 5' 1.5" (1.562 m)   Wt (!) 306 lb 4 oz (138.9 kg)   BMI 56.93 kg/m  , BMI Body mass index is 56.93 kg/m. Constitutional:  oriented to person, place, and time. No distress. Morbidly obese HENT:  Head: Normocephalic and atraumatic.  Eyes:  no discharge. No scleral icterus.  Neck: Normal range of motion. Neck supple. No JVD present.  Cardiovascular: Normal rate, regular rhythm, normal heart sounds and intact distal pulses. Exam reveals no gallop and no friction rub. No edema 1/6 SEM RSB,  Pulmonary/Chest: Effort normal and breath sounds normal. No stridor.  No respiratory distress.  no wheezes.  no rales.  no tenderness.  Abdominal: Soft.  no distension.  no tenderness.  Musculoskeletal: Normal range of motion.  no  tenderness or deformity.  Neurological:  normal muscle tone. Coordination normal. No atrophy Skin: Skin is warm and dry. No rash noted. not diaphoretic.  Psychiatric:  normal mood and affect. behavior is normal. Thought content normal.    Recent Labs: No results found for requested labs within last 8760 hours.    Lipid Panel Lab Results  Component Value Date   CHOL 188 12/02/2016   HDL 38 (L) 12/02/2016   LDLCALC 96 12/02/2016   TRIG 272 (H) 12/02/2016      Wt Readings from Last 3 Encounters:  01/09/18 (!) 306 lb 4 oz (138.9 kg)  12/22/16 (!) 322 lb 12.8 oz (146.4 kg)  12/08/16 (!) 321 lb 6.4 oz (145.8 kg)       ASSESSMENT AND PLAN:  Aortic valve stenosis, etiology of cardiac valve disease unspecified -  Murmur on exam consistent with aortic valve sclerosis without significant  stenosis No further testing needed Last echocardiogram 2016  SOB (shortness of breath) -  Takes Lasix as needed Shortness of breath likely secondary to morbid obesity, deconditioning  Hyperlipidemia, unspecified hyperlipidemia type -  Currently on Crestor Fasting lab work done this morning in the office May need to add Zetia  Other pulmonary embolism without acute cor pulmonale, unspecified chronicity (HCC) high risk of DVT and PE On Eliquis twice daily  Essential hypertension  no medication changes made stable   Poorly controlled type 2 diabetes mellitus with circulatory disorder (HCC) She does have endocrinology in [place] Last hemoglobin A1c 13 New labs today  Severe obesity (BMI >= 40) (HCC) Weight appears down 15 pounds compared to last year Recommended strict low carbohydrate diet   Total encounter time more than 25 minutes  Greater than 50% was spent in counseling and coordination of care with the patient   Disposition:   F/U  12 months   Orders Placed This Encounter  Procedures  . Hepatic function panel  . Lipid Profile  . HgB A1c  . Basic metabolic panel  . EKG 12-Lead     Signed, Dossie Arbour, M.D., Ph.D. 01/09/2018  [facility] [place], [place] [phone]

## 2018-01-09 ENCOUNTER — Ambulatory Visit: Payer: Commercial Managed Care - PPO | Admitting: Cardiovascular Disease

## 2018-01-09 ENCOUNTER — Encounter: Payer: Self-pay | Admitting: Cardiovascular Disease

## 2018-01-09 DIAGNOSIS — I350 Nonrheumatic aortic (valve) stenosis: Secondary | ICD-10-CM

## 2018-01-09 DIAGNOSIS — R0602 Shortness of breath: Secondary | ICD-10-CM | POA: Diagnosis not present

## 2018-01-09 DIAGNOSIS — E1159 Type 2 diabetes mellitus with other circulatory complications: Secondary | ICD-10-CM | POA: Diagnosis not present

## 2018-01-09 DIAGNOSIS — I2699 Other pulmonary embolism without acute cor pulmonale: Secondary | ICD-10-CM

## 2018-01-09 DIAGNOSIS — E785 Hyperlipidemia, unspecified: Secondary | ICD-10-CM

## 2018-01-09 DIAGNOSIS — E1165 Type 2 diabetes mellitus with hyperglycemia: Secondary | ICD-10-CM | POA: Diagnosis not present

## 2018-01-09 DIAGNOSIS — I10 Essential (primary) hypertension: Secondary | ICD-10-CM

## 2018-01-09 MED ORDER — ROSUVASTATIN CALCIUM 20 MG PO TABS: 20.0000 mg | ORAL_TABLET | Freq: Every day | ORAL | 11 refills | Status: DC

## 2018-01-09 MED ORDER — TELMISARTAN-HCTZ 80-12.5 MG PO TABS: 1.0000 | ORAL_TABLET | Freq: Every day | ORAL | 11 refills | Status: DC

## 2018-01-09 MED ORDER — POTASSIUM CHLORIDE ER 10 MEQ PO TBCR: 10.0000 meq | EXTENDED_RELEASE_TABLET | Freq: Every day | ORAL | 11 refills | Status: DC | PRN

## 2018-01-09 MED ORDER — APIXABAN 5 MG PO TABS: 5.0000 mg | ORAL_TABLET | Freq: Two times a day (BID) | ORAL | 11 refills | Status: DC

## 2018-01-09 MED ORDER — METOPROLOL SUCCINATE ER 25 MG PO TB24: 25.0000 mg | ORAL_TABLET | Freq: Every day | ORAL | 11 refills | Status: DC

## 2018-01-09 MED ORDER — FUROSEMIDE 40 MG PO TABS: 40.0000 mg | ORAL_TABLET | Freq: Every day | ORAL | 11 refills | Status: DC

## 2018-01-09 NOTE — Patient Instructions (Addendum)
Ask for Dr. Birdie SonsSonnenberg   Medication Instructions:   No medication changes made  Gollan mix: Water, ice cubes, tablespoon of citracel, with 1/2 to whole cap of generic miralex  Colace/senna  Labwork:  We will check liver , HBA1C BMP, and lipids today  Testing/Procedures:  No further testing at this time   Follow-Up: It was a pleasure seeing you in the office today. Please call us if you have new issues that need to be addressed before your next appt.  [phone]  Your physician wants you to follow-up in: 12 months.  You will receive a reminder letter in the mail two months in advance. If you don't receive a letter, please call our office to schedule the follow-up appointment.  If you need a refill on your cardiac medications before your next appointment, please call your pharmacy.

## 2018-01-10 LAB — HEPATIC FUNCTION PANEL
ALBUMIN: 4 g/dL (ref 3.6–4.8)
ALT: 11 IU/L (ref 0–32)
AST: 12 IU/L (ref 0–40)
Alkaline Phosphatase: 57 IU/L (ref 39–117)
Bilirubin Total: 0.4 mg/dL (ref 0.0–1.2)
Bilirubin, Direct: 0.17 mg/dL (ref 0.00–0.40)
Total Protein: 7.3 g/dL (ref 6.0–8.5)

## 2018-01-10 LAB — LIPID PANEL
CHOL/HDL RATIO: 4.8 ratio — AB (ref 0.0–4.4)
Cholesterol, Total: 157 mg/dL (ref 100–199)
HDL: 33 mg/dL — ABNORMAL LOW (ref >39)
LDL Calculated: 50 mg/dL (ref 0–99)
TRIGLYCERIDES: 371 mg/dL — AB (ref 0–149)
VLDL Cholesterol Cal: 74 mg/dL — ABNORMAL HIGH (ref 5–40)

## 2018-01-10 LAB — BASIC METABOLIC PANEL
BUN/Creatinine Ratio: 23 (ref 12–28)
BUN: 26 mg/dL (ref 8–27)
CHLORIDE: 98 mmol/L (ref 96–106)
CO2: 22 mmol/L (ref 20–29)
CREATININE: 1.14 mg/dL — AB (ref 0.57–1.00)
Calcium: 9.7 mg/dL (ref 8.7–10.3)
GFR calc Af Amer: 59 mL/min/{1.73_m2} — ABNORMAL LOW (ref >59)
GFR calc non Af Amer: 51 mL/min/{1.73_m2} — ABNORMAL LOW (ref >59)
Glucose: 358 mg/dL — ABNORMAL HIGH (ref 65–99)
POTASSIUM: 4.9 mmol/L (ref 3.5–5.2)
SODIUM: 138 mmol/L (ref 134–144)

## 2018-01-10 LAB — HEMOGLOBIN A1C
Est. average glucose Bld gHb Est-mCnc: 341 mg/dL
HEMOGLOBIN A1C: 13.5 % — AB (ref 4.8–5.6)

## 2018-01-19 ENCOUNTER — Encounter: Payer: Self-pay | Admitting: Internal Medicine

## 2018-01-19 ENCOUNTER — Ambulatory Visit: Payer: Commercial Managed Care - PPO | Admitting: Internal Medicine

## 2018-01-19 VITALS — BP 176/80 | HR 113 | Temp 100.6°F | Resp 94 | Ht 61.5 in | Wt 302.6 lb

## 2018-01-19 DIAGNOSIS — R6889 Other general symptoms and signs: Secondary | ICD-10-CM

## 2018-01-19 DIAGNOSIS — R05 Cough: Secondary | ICD-10-CM | POA: Diagnosis not present

## 2018-01-19 DIAGNOSIS — I10 Essential (primary) hypertension: Secondary | ICD-10-CM

## 2018-01-19 DIAGNOSIS — R059 Cough, unspecified: Secondary | ICD-10-CM

## 2018-01-19 DIAGNOSIS — E1165 Type 2 diabetes mellitus with hyperglycemia: Secondary | ICD-10-CM

## 2018-01-19 LAB — POC INFLUENZA A&B (BINAX/QUICKVUE)
Influenza A, POC: NEGATIVE
Influenza B, POC: NEGATIVE

## 2018-01-19 MED ORDER — ALBUTEROL SULFATE HFA 108 (90 BASE) MCG/ACT IN AERS: 1.0000 | INHALATION_SPRAY | Freq: Four times a day (QID) | RESPIRATORY_TRACT | 1 refills | Status: DC | PRN

## 2018-01-19 MED ORDER — DOXYCYCLINE HYCLATE 100 MG PO TABS
100.0000 mg | ORAL_TABLET | Freq: Two times a day (BID) | ORAL | 0 refills | Status: DC
Start: 2018-01-19 — End: 2019-03-16

## 2018-01-19 NOTE — Progress Notes (Signed)
Pre visit review using our clinic review tool, if applicable. No additional management support is needed unless otherwise documented below in the visit note. 

## 2018-01-19 NOTE — Patient Instructions (Addendum)
F/u in 2 weeks   Please do sliding scale insulin   Sugar 131-180 4 units  181-240 8 units  241-300 10 units  301-350-12 units  351-400 16 units  >400 20 units and call the doctor   Cough, Adult Coughing is a reflex that clears your throat and your airways. Coughing helps to heal and protect your lungs. It is normal to cough occasionally, but a cough that happens with other symptoms or lasts a long time may be a sign of a condition that needs treatment. A cough may last only 2-3 weeks (acute), or it may last longer than 8 weeks (chronic). What are the causes? Coughing is commonly caused by:  Breathing in substances that irritate your lungs.  A viral or bacterial respiratory infection.  Allergies.  Asthma.  Postnasal drip.  Smoking.  Acid backing up from the stomach into the esophagus (gastroesophageal reflux).  Certain medicines.  Chronic lung problems, including COPD (or rarely, lung cancer).  Other medical conditions such as heart failure.  Follow these instructions at home: Pay attention to any changes in your symptoms. Take these actions to help with your discomfort:  Take medicines only as told by your health care provider. ? If you were prescribed an antibiotic medicine, take it as told by your health care provider. Do not stop taking the antibiotic even if you start to feel better. ? Talk with your health care provider before you take a cough suppressant medicine.  Drink enough fluid to keep your urine clear or pale yellow.  If the air is dry, use a cold steam vaporizer or humidifier in your bedroom or your home to help loosen secretions.  Avoid anything that causes you to cough at work or at home.  If your cough is worse at night, try sleeping in a semi-upright position.  Avoid cigarette smoke. If you smoke, quit smoking. If you need help quitting, ask your health care provider.  Avoid caffeine.  Avoid alcohol.  Rest as needed.  Contact a health  care provider if:  You have new symptoms.  You cough up pus.  Your cough does not get better after 2-3 weeks, or your cough gets worse.  You cannot control your cough with suppressant medicines and you are losing sleep.  You develop pain that is getting worse or pain that is not controlled with pain medicines.  You have a fever.  You have unexplained weight loss.  You have night sweats. Get help right away if:  You cough up blood.  You have difficulty breathing.  Your heartbeat is very fast. This information is not intended to replace advice given to you by your health care provider. Make sure you discuss any questions you have with your health care provider. Document Released: 05/14/2011 Document Revised: 04/22/2016 Document Reviewed: 01/22/2015 Elsevier Interactive Patient Education  2018 [employer].    Diabetes Mellitus and Nutrition When you have diabetes (diabetes mellitus), it is very important to have healthy eating habits because your blood sugar (glucose) levels are greatly affected by what you eat and drink. Eating healthy foods in the appropriate amounts, at about the same times every day, can help you:  Control your blood glucose.  Lower your risk of heart disease.  Improve your blood pressure.  Reach or maintain a healthy weight.  Every person with diabetes is different, and each person has different needs for a meal plan. Your health care provider may recommend that you work with a diet and nutrition specialist (  dietitian) to make a meal plan that is best for you. Your meal plan may vary depending on factors such as:  The calories you need.  The medicines you take.  Your weight.  Your blood glucose, blood pressure, and cholesterol levels.  Your activity level.  Other health conditions you have, such as heart or kidney disease.  How do carbohydrates affect me? Carbohydrates affect your blood glucose level more than any other type of food. Eating  carbohydrates naturally increases the amount of glucose in your blood. Carbohydrate counting is a method for keeping track of how many carbohydrates you eat. Counting carbohydrates is important to keep your blood glucose at a healthy level, especially if you use insulin or take certain oral diabetes medicines. It is important to know how many carbohydrates you can safely have in each meal. This is different for every person. Your dietitian can help you calculate how many carbohydrates you should have at each meal and for snack. Foods that contain carbohydrates include:  Bread, cereal, rice, pasta, and crackers.  Potatoes and corn.  Peas, beans, and lentils.  Milk and yogurt.  Fruit and juice.  Desserts, such as cakes, cookies, ice cream, and candy.  How does alcohol affect me? Alcohol can cause a sudden decrease in blood glucose (hypoglycemia), especially if you use insulin or take certain oral diabetes medicines. Hypoglycemia can be a life-threatening condition. Symptoms of hypoglycemia (sleepiness, dizziness, and confusion) are similar to symptoms of having too much alcohol. If your health care provider says that alcohol is safe for you, follow these guidelines:  Limit alcohol intake to no more than 1 drink per day for nonpregnant women and 2 drinks per day for men. One drink equals 12 oz of beer, 5 oz of wine, or 1 oz of hard liquor.  Do not drink on an empty stomach.  Keep yourself hydrated with water, diet soda, or unsweetened iced tea.  Keep in mind that regular soda, juice, and other mixers may contain a lot of sugar and must be counted as carbohydrates.  What are tips for following this plan? Reading food labels  Start by checking the serving size on the label. The amount of calories, carbohydrates, fats, and other nutrients listed on the label are based on one serving of the food. Many foods contain more than one serving per package.  Check the total grams (g) of  carbohydrates in one serving. You can calculate the number of servings of carbohydrates in one serving by dividing the total carbohydrates by 15. For example, if a food has 30 g of total carbohydrates, it would be equal to 2 servings of carbohydrates.  Check the number of grams (g) of saturated and trans fats in one serving. Choose foods that have low or no amount of these fats.  Check the number of milligrams (mg) of sodium in one serving. Most people should limit total sodium intake to less than 2,300 mg per day.  Always check the nutrition information of foods labeled as "low-fat" or "nonfat". These foods may be higher in added sugar or refined carbohydrates and should be avoided.  Talk to your dietitian to identify your daily goals for nutrients listed on the label. Shopping  Avoid buying canned, premade, or processed foods. These foods tend to be high in fat, sodium, and added sugar.  Shop around the outside edge of the grocery store. This includes fresh fruits and vegetables, bulk grains, fresh meats, and fresh dairy. Cooking  Use low-heat cooking methods,  such as baking, instead of high-heat cooking methods like deep frying.  Cook using healthy oils, such as olive, canola, or sunflower oil.  Avoid cooking with butter, cream, or high-fat meats. Meal planning  Eat meals and snacks regularly, preferably at the same times every day. Avoid going long periods of time without eating.  Eat foods high in fiber, such as fresh fruits, vegetables, beans, and whole grains. Talk to your dietitian about how many servings of carbohydrates you can eat at each meal.  Eat 4-6 ounces of lean protein each day, such as lean meat, chicken, fish, eggs, or tofu. 1 ounce is equal to 1 ounce of meat, chicken, or fish, 1 egg, or 1/4 cup of tofu.  Eat some foods each day that contain healthy fats, such as avocado, nuts, seeds, and fish. Lifestyle   Check your blood glucose regularly.  Exercise at least  30 minutes 5 or more days each week, or as told by your health care provider.  Take medicines as told by your health care provider.  Do not use any products that contain nicotine or tobacco, such as cigarettes and e-cigarettes. If you need help quitting, ask your health care provider.  Work with a Veterinary surgeon or diabetes educator to identify strategies to manage stress and any emotional and social challenges. What are some questions to ask my health care provider?  Do I need to meet with a diabetes educator?  Do I need to meet with a dietitian?  What number can I call if I have questions?  When are the best times to check my blood glucose? Where to find more information:  American Diabetes Association: diabetes.org/food-and-fitness/food  Academy of Nutrition and Dietetics: https://www.vargas.com/  [employer] of Diabetes and Digestive and Kidney Diseases (NIH): FindJewelers.cz Summary  A healthy meal plan will help you control your blood glucose and maintain a healthy lifestyle.  Working with a diet and nutrition specialist (dietitian) can help you make a meal plan that is best for you.  Keep in mind that carbohydrates and alcohol have immediate effects on your blood glucose levels. It is important to count carbohydrates and to use alcohol carefully. This information is not intended to replace advice given to you by your health care provider. Make sure you discuss any questions you have with your health care provider. Document Released: 08/12/2005 Document Revised: 12/20/2016 Document Reviewed: 12/20/2016 Elsevier Interactive Patient Education  [employer].

## 2018-01-19 NOTE — Progress Notes (Signed)
Chief Complaint  Patient presents with  . Cough  . URI   Sick visit  C/o cough, congestion, sneezing, and wheezing x 2 days. Goddaughter was sick. She tried Tylenol and Robutissin. Today she has fever 100.6 and elevated HR. She also has been feeling sob but cpap helps she thinks. She has not tried to use proair inhaler   DM 2 A1C 13.5 her endocrinologist is in [place]. She is on 24 units bid of novolog, Tresiba 150 units, Janumet 50-500 mg bid, Ozempic 1.0 weekly  HTN uncontrolled today she has not taken in BP meds today on Toprol XL 25 mg qd, Micaridis 80-12.5 mg qd, lasix 40 mg qd.    She spends Dec-March in [place] and then goes to [place] then comes back to [facility] summer for 1.5 months then back in Dec.     Review of Systems  Constitutional: Positive for fever.  HENT: Negative for hearing loss.   Eyes: Negative for blurred vision.  Respiratory: Positive for cough and shortness of breath.   Cardiovascular: Positive for palpitations. Negative for chest pain.  Skin: Negative for rash.   Past Medical History:  Diagnosis Date  . Calculus of kidney   . Diabetes mellitus without complication (HCC)   . DVT (deep venous thrombosis) (HCC)    a. on Eliquis  . Family history of early CAD    a. parents passing in their 7's from CAD  . Gout, unspecified   . Heart murmur   . Hyperlipidemia   . Hypertension   . Morbid obesity (HCC)   . Normal cardiac stress test    a. equivocal study, sig soft tissue artifact present, no chest discomfort or ECG changes, perfusion images suggest mod sized region of mild reversible perfusion defect. Findings may be 2/2 shifting soft tissue attenuation, but cannot r/o ischemia, EF 72%  . Phlebitis and thrombophlebitis of other deep vessels of lower extremities   . Phlebitis and thrombophlebitis of other deep vessels of lower extremities   . Pulmonary emboli (HCC)    a. on Eliquis  . Spinal stenosis, unspecified region other than cervical   . Tobacco abuse   . Type II or  unspecified type diabetes mellitus without mention of complication, uncontrolled   . Unspecified sleep apnea   . Urinary tract infection, site not specified    Past Surgical History:  Procedure Laterality Date  . ABDOMINAL HYSTERECTOMY     hyperplasia of endometrium  . ANKLE SURGERY     fracture s/p pin, right ankle  . APPENDECTOMY    . CHOLECYSTECTOMY    . COLECTOMY     temporary colostomy, now reversed  . INTESTINAL BYPASS     ovarian cyst ruptured, led to perforated intestine  . STOMACH SURGERY     Family History  Problem Relation Age of Onset  . Heart attack Mother 72  . Cancer Mother        breast  . Heart attack Father 22  . Arthritis Sister    Social History   Socioeconomic History  . Marital status: Single    Spouse name: Not on file  . Number of children: Not on file  . Years of education: Not on file  . Highest education level: Not on file  Social Needs  . Financial resource strain: Not on file  . Food insecurity - worry: Not on file  . Food insecurity - inability: Not on file  . Transportation needs - medical: Not on file  . Transportation needs -  non-medical: Not on file  Occupational History  . Not on file  Tobacco Use  . Smoking status: Former Smoker    Packs/day: 1.00    Years: 10.00    Pack years: 10.00    Last attempt to quit: 12/26/1999    Years since quitting: 18.0  . Smokeless tobacco: Never Used  Substance and Sexual Activity  . Alcohol use: Yes    Comment: socially  . Drug use: No  . Sexual activity: Not on file  Other Topics Concern  . Not on file  Social History Narrative   Lives in [place], [place] summer, [place] Dec-Mar.  Sister and children live here in [place]      Work - Retired Designer, television/film set      Diet - regular      Exercise - walks, limited by knee and ankle pain   Current Meds  Medication Sig  . albuterol (PROAIR HFA) 108 (90 Base) MCG/ACT inhaler Inhale 1-2 puffs into the lungs every 6 (six) hours as needed for  wheezing or shortness of breath.  . allopurinol (ZYLOPRIM) 300 MG tablet Take 300 mg by mouth daily.  Marland Kitchen apixaban (ELIQUIS) 5 MG TABS tablet Take 1 tablet (5 mg total) by mouth 2 (two) times daily.  . celecoxib (CELEBREX) 50 MG capsule Take 50 mg by mouth 2 (two) times daily.  . Cholecalciferol (VITAMIN D) 1000 UNITS capsule Take 2,000 Units by mouth daily.  Marland Kitchen esomeprazole (NEXIUM) 40 MG capsule Take 40 mg by mouth daily before breakfast.  . fenofibrate 54 MG tablet Take 54 mg by mouth daily.  . furosemide (LASIX) 40 MG tablet Take 1 tablet (40 mg total) by mouth daily.  . Insulin Aspart (NOVOLOG FLEXPEN [place]) Inject 20 Units into the skin 2 (two) times daily.  . Insulin Degludec (TRESIBA FLEXTOUCH [place]) Inject 150 Units into the skin daily.  Marland Kitchen JANUMET 50-500 MG tablet TK 1 T PO BID  . metoprolol succinate (TOPROL-XL) 25 MG 24 hr tablet Take 1 tablet (25 mg total) by mouth daily.  . Multiple Vitamin (MULTIVITAMIN) tablet Take 1 tablet by mouth daily.  Marland Kitchen OZEMPIC 1 MG/DOSE SOPN INJECT 1 MG SUBCUNTANEOUSLY ONCE A WEEK  . potassium chloride (K-DUR) 10 MEQ tablet Take 1 tablet (10 mEq total) by mouth daily as needed (Daily with Lasix (Furosemide)).  . rosuvastatin (CRESTOR) 20 MG tablet Take 1 tablet (20 mg total) by mouth daily.  Marland Kitchen telmisartan-hydrochlorothiazide (MICARDIS HCT) 80-12.5 MG tablet Take 1 tablet by mouth daily.  . [DISCONTINUED] PROAIR HFA 108 (90 Base) MCG/ACT inhaler INHALE 2 PUFFS INTO THE LUNGS EVERY 6 HOURS AS NEEDED FOR WHEEZING OR SHORTNESS OF BREATH   Allergies  Allergen Reactions  . Sulfa Antibiotics Other (See Comments)    Reaction: isn't certain, thinks she ran a fever, or had a rash, maybe both.   Recent Results (from the past 2160 hour(s))  Hepatic function panel     Status: None   Collection Time: 01/09/18  9:49 AM  Result Value Ref Range   Total Protein 7.3 6.0 - 8.5 g/dL   Albumin 4.0 3.6 - 4.8 g/dL   Bilirubin Total 0.4 0.0 - 1.2 mg/dL   Bilirubin, Direct 1.61  0.00 - 0.40 mg/dL   Alkaline Phosphatase 57 39 - 117 IU/L   AST 12 0 - 40 IU/L   ALT 11 0 - 32 IU/L  Lipid Profile     Status: Abnormal   Collection Time: 01/09/18  9:49 AM  Result Value Ref Range  Cholesterol, Total 157 100 - 199 mg/dL   Triglycerides 161 (H) 0 - 149 mg/dL   HDL 33 (L) >09 mg/dL   VLDL Cholesterol Cal 74 (H) 5 - 40 mg/dL   LDL Calculated 50 0 - 99 mg/dL   Chol/HDL Ratio 4.8 (H) 0.0 - 4.4 ratio    Comment:                                   T. Chol/HDL Ratio                                             Men  Women                               1/2 Avg.Risk  3.4    3.3                                   Avg.Risk  5.0    4.4                                2X Avg.Risk  9.6    7.1                                3X Avg.Risk 23.4   11.0   HgB A1c     Status: Abnormal   Collection Time: 01/09/18  9:49 AM  Result Value Ref Range   Hgb A1c MFr Bld 13.5 (H) 4.8 - 5.6 %    Comment:          Prediabetes: 5.7 - 6.4          Diabetes: >6.4          Glycemic control for adults with diabetes: <7.0    Est. average glucose Bld gHb Est-mCnc 341 mg/dL  Basic metabolic panel     Status: Abnormal   Collection Time: 01/09/18  9:49 AM  Result Value Ref Range   Glucose 358 (H) 65 - 99 mg/dL   BUN 26 8 - 27 mg/dL   Creatinine, Ser 6.04 (H) 0.57 - 1.00 mg/dL   GFR calc non Af Amer 51 (L) >59 mL/min/1.73   GFR calc Af Amer 59 (L) >59 mL/min/1.73   BUN/Creatinine Ratio 23 12 - 28   Sodium 138 134 - 144 mmol/L   Potassium 4.9 3.5 - 5.2 mmol/L   Chloride 98 96 - 106 mmol/L   CO2 22 20 - 29 mmol/L   Calcium 9.7 8.7 - 10.3 mg/dL  POC Influenza A&B (Binax test)     Status: Normal   Collection Time: 01/19/18  4:33 PM  Result Value Ref Range   Influenza A, POC Negative Negative   Influenza B, POC Negative Negative   Objective  Body mass index is 56.25 kg/m. Wt Readings from Last 3 Encounters:  01/19/18 (!) 302 lb 9.6 oz (137.3 kg)  01/09/18 (!) 306 lb 4 oz (138.9 kg)  12/22/16 (!)  322 lb 12.8 oz (146.4 kg)   Temp Readings from Last 3 Encounters:  01/19/18 (!) 100.6  F (38.1 C) (Oral)  12/22/16 98.3 F (36.8 C) (Oral)  12/08/16 98.5 F (36.9 C) (Oral)   BP Readings from Last 3 Encounters:  01/19/18 (!) 176/80  01/09/18 140/80  12/22/16 (!) 184/78   Pulse Readings from Last 3 Encounters:  01/19/18 (!) 113  01/09/18 89  12/22/16 (!) 108   O2 sat room air 94%   Physical Exam  Constitutional: She is oriented to person, place, and time and well-developed, well-nourished, and in no distress.  HENT:  Head: Normocephalic and atraumatic.  Mouth/Throat: Oropharynx is clear and moist and mucous membranes are normal.  Eyes: Conjunctivae are normal. Pupils are equal, round, and reactive to light.  Cardiovascular: Regular rhythm and normal heart sounds. Tachycardia present.  Pulmonary/Chest: Effort normal and breath sounds normal. She has no wheezes.  Neurological: She is alert and oriented to person, place, and time. Gait normal. Gait normal.  Skin: Skin is warm, dry and intact.  Psychiatric: Mood, memory, affect and judgment normal.  Nursing note and vitals reviewed.   Assessment   1. Cough ? Etiology viral vs bacterial with fever today flu negative  2. DM 2 uncontrolled A1C 13.5  3. HTN uncontrolled w/o meds today  4. H M Plan  1.  F/u in 2 weeks if not better will do CXR  Supportive care with sugar free products  Doxy bid x 1 week, prn Proair  2. Given SSI-M for novolog on  Sugar 131-180 4 units  181-240 8 units  241-300 10 units  301-350-12 units  351-400 16 units  >400 20 units and call the doctor   Tresiba 150 qd, Janumet 50-500 mg bid, Ozempic 1.0 weekly  Pt due to f[place]u [place]ocrine 01[place] in [place] Advised pt to increase SSI 24 units bid to tid per sliding scale  She may need a more concentrated insuline per Endocrine  3. rec she take meds  Micardis 80-12.5 mg qd, Toprol XL 25 mg qd, lasix 40 mg qd Recheck BP in 2 weeks last visit was normal  4.   Had flu shot 08/2017  pna 23 vaccine had but due again  Ask about Tdap at f/u  Disc shingrix at f/u   mammo get [place] Pap [place]  [place]  Provider: Dr. French Anaracy McLean-Scocuzza-Internal Medicine

## 2018-01-27 ENCOUNTER — Ambulatory Visit: Payer: Commercial Managed Care - PPO | Admitting: Internal Medicine

## 2018-01-27 ENCOUNTER — Encounter: Payer: Self-pay | Admitting: Internal Medicine

## 2018-01-27 ENCOUNTER — Ambulatory Visit (INDEPENDENT_AMBULATORY_CARE_PROVIDER_SITE_OTHER): Payer: Commercial Managed Care - PPO | Admitting: Internal Medicine

## 2018-01-27 VITALS — BP 130/78 | HR 98 | Temp 98.2°F | Ht 61.5 in | Wt 300.2 lb

## 2018-01-27 DIAGNOSIS — H269 Unspecified cataract: Secondary | ICD-10-CM | POA: Insufficient documentation

## 2018-01-27 DIAGNOSIS — I10 Essential (primary) hypertension: Secondary | ICD-10-CM | POA: Diagnosis not present

## 2018-01-27 DIAGNOSIS — E1159 Type 2 diabetes mellitus with other circulatory complications: Secondary | ICD-10-CM | POA: Diagnosis not present

## 2018-01-27 DIAGNOSIS — Z1329 Encounter for screening for other suspected endocrine disorder: Secondary | ICD-10-CM | POA: Diagnosis not present

## 2018-01-27 DIAGNOSIS — Z13818 Encounter for screening for other digestive system disorders: Secondary | ICD-10-CM | POA: Diagnosis not present

## 2018-01-27 DIAGNOSIS — E1165 Type 2 diabetes mellitus with hyperglycemia: Secondary | ICD-10-CM

## 2018-01-27 DIAGNOSIS — E785 Hyperlipidemia, unspecified: Secondary | ICD-10-CM | POA: Diagnosis not present

## 2018-01-27 DIAGNOSIS — Z23 Encounter for immunization: Secondary | ICD-10-CM

## 2018-01-27 DIAGNOSIS — Z1159 Encounter for screening for other viral diseases: Secondary | ICD-10-CM

## 2018-01-27 NOTE — Patient Instructions (Addendum)
Please do Novolog 3x per per sliding scale  Try Desitin or A&D ointment with Clotrimazole to vaginal region 2x per day over the counter  Please have your doctors fax notes to me when you see them  Find out when last pneumonia vaccine was we have 09/15/12 for pneumonia 23 this is due in 5 years  prevnar at age 62  F/u 05/2018 or 06/2018  Take care   Diabetes Mellitus and Nutrition When you have diabetes (diabetes mellitus), it is very important to have healthy eating habits because your blood sugar (glucose) levels are greatly affected by what you eat and drink. Eating healthy foods in the appropriate amounts, at about the same times every day, can help you:  Control your blood glucose.  Lower your risk of heart disease.  Improve your blood pressure.  Reach or maintain a healthy weight.  Every person with diabetes is different, and each person has different needs for a meal plan. Your health care provider may recommend that you work with a diet and nutrition specialist (dietitian) to make a meal plan that is best for you. Your meal plan may vary depending on factors such as:  The calories you need.  The medicines you take.  Your weight.  Your blood glucose, blood pressure, and cholesterol levels.  Your activity level.  Other health conditions you have, such as heart or kidney disease.  How do carbohydrates affect me? Carbohydrates affect your blood glucose level more than any other type of food. Eating carbohydrates naturally increases the amount of glucose in your blood. Carbohydrate counting is a method for keeping track of how many carbohydrates you eat. Counting carbohydrates is important to keep your blood glucose at a healthy level, especially if you use insulin or take certain oral diabetes medicines. It is important to know how many carbohydrates you can safely have in each meal. This is different for every person. Your dietitian can help you calculate how many carbohydrates  you should have at each meal and for snack. Foods that contain carbohydrates include:  Bread, cereal, rice, pasta, and crackers.  Potatoes and corn.  Peas, beans, and lentils.  Milk and yogurt.  Fruit and juice.  Desserts, such as cakes, cookies, ice cream, and candy.  How does alcohol affect me? Alcohol can cause a sudden decrease in blood glucose (hypoglycemia), especially if you use insulin or take certain oral diabetes medicines. Hypoglycemia can be a life-threatening condition. Symptoms of hypoglycemia (sleepiness, dizziness, and confusion) are similar to symptoms of having too much alcohol. If your health care provider says that alcohol is safe for you, follow these guidelines:  Limit alcohol intake to no more than 1 drink per day for nonpregnant women and 2 drinks per day for men. One drink equals 12 oz of beer, 5 oz of wine, or 1 oz of hard liquor.  Do not drink on an empty stomach.  Keep yourself hydrated with water, diet soda, or unsweetened iced tea.  Keep in mind that regular soda, juice, and other mixers may contain a lot of sugar and must be counted as carbohydrates.  What are tips for following this plan? Reading food labels  Start by checking the serving size on the label. The amount of calories, carbohydrates, fats, and other nutrients listed on the label are based on one serving of the food. Many foods contain more than one serving per package.  Check the total grams (g) of carbohydrates in one serving. You can calculate the number of  servings of carbohydrates in one serving by dividing the total carbohydrates by 15. For example, if a food has 30 g of total carbohydrates, it would be equal to 2 servings of carbohydrates.  Check the number of grams (g) of saturated and trans fats in one serving. Choose foods that have low or no amount of these fats.  Check the number of milligrams (mg) of sodium in one serving. Most people should limit total sodium intake to less  than 2,300 mg per day.  Always check the nutrition information of foods labeled as "low-fat" or "nonfat". These foods may be higher in added sugar or refined carbohydrates and should be avoided.  Talk to your dietitian to identify your daily goals for nutrients listed on the label. Shopping  Avoid buying canned, premade, or processed foods. These foods tend to be high in fat, sodium, and added sugar.  Shop around the outside edge of the grocery store. This includes fresh fruits and vegetables, bulk grains, fresh meats, and fresh dairy. Cooking  Use low-heat cooking methods, such as baking, instead of high-heat cooking methods like deep frying.  Cook using healthy oils, such as olive, canola, or sunflower oil.  Avoid cooking with butter, cream, or high-fat meats. Meal planning  Eat meals and snacks regularly, preferably at the same times every day. Avoid going long periods of time without eating.  Eat foods high in fiber, such as fresh fruits, vegetables, beans, and whole grains. Talk to your dietitian about how many servings of carbohydrates you can eat at each meal.  Eat 4-6 ounces of lean protein each day, such as lean meat, chicken, fish, eggs, or tofu. 1 ounce is equal to 1 ounce of meat, chicken, or fish, 1 egg, or 1/4 cup of tofu.  Eat some foods each day that contain healthy fats, such as avocado, nuts, seeds, and fish. Lifestyle   Check your blood glucose regularly.  Exercise at least 30 minutes 5 or more days each week, or as told by your health care provider.  Take medicines as told by your health care provider.  Do not use any products that contain nicotine or tobacco, such as cigarettes and e-cigarettes. If you need help quitting, ask your health care provider.  Work with a Veterinary surgeon or diabetes educator to identify strategies to manage stress and any emotional and social challenges. What are some questions to ask my health care provider?  Do I need to meet with a  diabetes educator?  Do I need to meet with a dietitian?  What number can I call if I have questions?  When are the best times to check my blood glucose? Where to find more information:  American Diabetes Association: diabetes.org/food-and-fitness/food  Academy of Nutrition and Dietetics: https://www.vargas.com/  [employer] of Diabetes and Digestive and Kidney Diseases (NIH): FindJewelers.cz Summary  A healthy meal plan will help you control your blood glucose and maintain a healthy lifestyle.  Working with a diet and nutrition specialist (dietitian) can help you make a meal plan that is best for you.  Keep in mind that carbohydrates and alcohol have immediate effects on your blood glucose levels. It is important to count carbohydrates and to use alcohol carefully. This information is not intended to replace advice given to you by your health care provider. Make sure you discuss any questions you have with your health care provider. Document Released: 08/12/2005 Document Revised: 12/20/2016 Document Reviewed: 12/20/2016 Elsevier Interactive Patient Education  [employer].

## 2018-01-27 NOTE — Progress Notes (Signed)
Pre visit review using our clinic review tool, if applicable. No additional management support is needed unless otherwise documented below in the visit note. 

## 2018-01-30 ENCOUNTER — Encounter: Payer: Self-pay | Admitting: Internal Medicine

## 2018-01-30 NOTE — Progress Notes (Signed)
Chief Complaint  Patient presents with  . Follow-up   F/u from sick visit feeling better  1. HTN improved on micardis 80-12.5, toprol 25 xl  2. DM 2 cbgs per pt improved at home and using novolog 2x per day rec she used 3x per day  3. She is going to [place] and will have MD, endocrine and colonoscopy appt upcoming spring 2019     Review of Systems  Constitutional: Negative for weight loss.  HENT: Negative for hearing loss.   Eyes: Negative for blurred vision.  Respiratory: Negative for shortness of breath.   Cardiovascular: Negative for chest pain.  Skin: Negative for rash.   Past Medical History:  Diagnosis Date  . Calculus of kidney   . Diabetes mellitus without complication (HCC)   . DVT (deep venous thrombosis) (HCC)    a. on Eliquis  . Family history of early CAD    a. parents passing in their 39's from CAD  . Gout, unspecified   . Heart murmur   . Hyperlipidemia   . Hypertension   . Morbid obesity (HCC)   . Normal cardiac stress test    a. equivocal study, sig soft tissue artifact present, no chest discomfort or ECG changes, perfusion images suggest mod sized region of mild reversible perfusion defect. Findings may be 2/2 shifting soft tissue attenuation, but cannot r/o ischemia, EF 72%  . Phlebitis and thrombophlebitis of other deep vessels of lower extremities   . Phlebitis and thrombophlebitis of other deep vessels of lower extremities   . Pulmonary emboli (HCC)    a. on Eliquis  . Spinal stenosis, unspecified region other than cervical   . Tobacco abuse   . Type II or unspecified type diabetes mellitus without mention of complication, uncontrolled   . Unspecified sleep apnea   . Urinary tract infection, site not specified    Past Surgical History:  Procedure Laterality Date  . ABDOMINAL HYSTERECTOMY     hyperplasia of endometrium  . ANKLE SURGERY     fracture s/p pin, right ankle  . APPENDECTOMY    . CHOLECYSTECTOMY    . COLECTOMY     temporary colostomy,  now reversed  . INTESTINAL BYPASS     ovarian cyst ruptured, led to perforated intestine  . STOMACH SURGERY     Family History  Problem Relation Age of Onset  . Heart attack Mother 59  . Cancer Mother        breast  . Heart attack Father 42  . Arthritis Sister    Social History   Socioeconomic History  . Marital status: Single    Spouse name: Not on file  . Number of children: Not on file  . Years of education: Not on file  . Highest education level: Not on file  Social Needs  . Financial resource strain: Not on file  . Food insecurity - worry: Not on file  . Food insecurity - inability: Not on file  . Transportation needs - medical: Not on file  . Transportation needs - non-medical: Not on file  Occupational History  . Not on file  Tobacco Use  . Smoking status: Former Smoker    Packs/day: 1.00    Years: 10.00    Pack years: 10.00    Last attempt to quit: 12/26/1999    Years since quitting: 18.1  . Smokeless tobacco: Never Used  Substance and Sexual Activity  . Alcohol use: Yes    Comment: socially  . Drug use: No  .  Sexual activity: Not on file  Other Topics Concern  . Not on file  Social History Narrative   Lives in [place], [place] summer, [place] Dec-Mar.  Sister and children live here in [place]      Work - Retired Designer, television/film setlegislative aid      Diet - regular      Exercise - walks, limited by knee and ankle pain   Current Meds  Medication Sig  . albuterol (PROAIR HFA) 108 (90 Base) MCG/ACT inhaler Inhale 1-2 puffs into the lungs every 6 (six) hours as needed for wheezing or shortness of breath.  . allopurinol (ZYLOPRIM) 300 MG tablet Take 300 mg by mouth daily.  Marland Kitchen. apixaban (ELIQUIS) 5 MG TABS tablet Take 1 tablet (5 mg total) by mouth 2 (two) times daily.  . celecoxib (CELEBREX) 50 MG capsule Take 50 mg by mouth 2 (two) times daily.  . Cholecalciferol (VITAMIN D) 1000 UNITS capsule Take 2,000 Units by mouth daily.  Marland Kitchen. doxycycline (VIBRA-TABS) 100 MG tablet Take 1  tablet (100 mg total) by mouth 2 (two) times daily.  Marland Kitchen. esomeprazole (NEXIUM) 40 MG capsule Take 40 mg by mouth daily before breakfast.  . fenofibrate 54 MG tablet Take 54 mg by mouth daily.  . furosemide (LASIX) 40 MG tablet Take 1 tablet (40 mg total) by mouth daily.  . Insulin Aspart (NOVOLOG FLEXPEN [place]) Inject 20 Units into the skin 2 (two) times daily.  . Insulin Degludec (TRESIBA FLEXTOUCH ) Inject 150 Units into the skin daily.  Marland Kitchen. JANUMET 50-500 MG tablet TK 1 T PO BID  . metoprolol succinate (TOPROL-XL) 25 MG 24 hr tablet Take 1 tablet (25 mg total) by mouth daily.  . Multiple Vitamin (MULTIVITAMIN) tablet Take 1 tablet by mouth daily.  Marland Kitchen. OZEMPIC 1 MG/DOSE SOPN INJECT 1 MG SUBCUNTANEOUSLY ONCE A WEEK  . potassium chloride (K-DUR) 10 MEQ tablet Take 1 tablet (10 mEq total) by mouth daily as needed (Daily with Lasix (Furosemide)).  . rosuvastatin (CRESTOR) 20 MG tablet Take 1 tablet (20 mg total) by mouth daily.  Marland Kitchen. telmisartan-hydrochlorothiazide (MICARDIS HCT) 80-12.5 MG tablet Take 1 tablet by mouth daily.   Allergies  Allergen Reactions  . Sulfa Antibiotics Other (See Comments)    Reaction: isn't certain, thinks she ran a fever, or had a rash, maybe both.   Recent Results (from the past 2160 hour(s))  Hepatic function panel     Status: None   Collection Time: 01/09/18  9:49 AM  Result Value Ref Range   Total Protein 7.3 6.0 - 8.5 g/dL   Albumin 4.0 3.6 - 4.8 g/dL   Bilirubin Total 0.4 0.0 - 1.2 mg/dL   Bilirubin, Direct 1.610.17 0.00 - 0.40 mg/dL   Alkaline Phosphatase 57 39 - 117 IU/L   AST 12 0 - 40 IU/L   ALT 11 0 - 32 IU/L  Lipid Profile     Status: Abnormal   Collection Time: 01/09/18  9:49 AM  Result Value Ref Range   Cholesterol, Total 157 100 - 199 mg/dL   Triglycerides 096371 (H) 0 - 149 mg/dL   HDL 33 (L) >04>39 mg/dL   VLDL Cholesterol Cal 74 (H) 5 - 40 mg/dL   LDL Calculated 50 0 - 99 mg/dL   Chol/HDL Ratio 4.8 (H) 0.0 - 4.4 ratio    Comment:  T. Chol/HDL Ratio                                             Men  Women                               1/2 Avg.Risk  3.4    3.3                                   Avg.Risk  5.0    4.4                                2X Avg.Risk  9.6    7.1                                3X Avg.Risk 23.4   11.0   HgB A1c     Status: Abnormal   Collection Time: 01/09/18  9:49 AM  Result Value Ref Range   Hgb A1c MFr Bld 13.5 (H) 4.8 - 5.6 %    Comment:          Prediabetes: 5.7 - 6.4          Diabetes: >6.4          Glycemic control for adults with diabetes: <7.0    Est. average glucose Bld gHb Est-mCnc 341 mg/dL  Basic metabolic panel     Status: Abnormal   Collection Time: 01/09/18  9:49 AM  Result Value Ref Range   Glucose 358 (H) 65 - 99 mg/dL   BUN 26 8 - 27 mg/dL   Creatinine, Ser 4.09 (H) 0.57 - 1.00 mg/dL   GFR calc non Af Amer 51 (L) >59 mL/min/1.73   GFR calc Af Amer 59 (L) >59 mL/min/1.73   BUN/Creatinine Ratio 23 12 - 28   Sodium 138 134 - 144 mmol/L   Potassium 4.9 3.5 - 5.2 mmol/L   Chloride 98 96 - 106 mmol/L   CO2 22 20 - 29 mmol/L   Calcium 9.7 8.7 - 10.3 mg/dL  POC Influenza A&B (Binax test)     Status: Normal   Collection Time: 01/19/18  4:33 PM  Result Value Ref Range   Influenza A, POC Negative Negative   Influenza B, POC Negative Negative   Objective  Body mass index is 55.8 kg/m. Wt Readings from Last 3 Encounters:  01/27/18 (!) 300 lb 3.2 oz (136.2 kg)  01/19/18 (!) 302 lb 9.6 oz (137.3 kg)  01/09/18 (!) 306 lb 4 oz (138.9 kg)   Temp Readings from Last 3 Encounters:  01/27/18 98.2 F (36.8 C) (Oral)  01/19/18 (!) 100.6 F (38.1 C) (Oral)  12/22/16 98.3 F (36.8 C) (Oral)   BP Readings from Last 3 Encounters:  01/27/18 130/78  01/19/18 (!) 176/80  01/09/18 140/80   Pulse Readings from Last 3 Encounters:  01/27/18 98  01/19/18 (!) 113  01/09/18 89   O2 sat room air 94% Physical Exam  Constitutional: She is oriented to person, place, and time  and well-developed, well-nourished, and in no distress. Vital signs are normal.  HENT:  Head: Normocephalic and atraumatic.  Mouth/Throat: Oropharynx is clear and moist and mucous membranes are normal.  Eyes: Conjunctivae are normal. Pupils are equal, round, and reactive to light.  Cardiovascular: Normal rate and regular rhythm.  Murmur heard. Pulmonary/Chest: Effort normal and breath sounds normal.  Neurological: She is alert and oriented to person, place, and time. Gait normal.  Skin: Skin is warm, dry and intact.  Psychiatric: Mood, memory, affect and judgment normal.  Nursing note and vitals reviewed.   Assessment   1. HTN/HLD 2. DM 2 uncontrolled A1C 13.5  3. H m Plan  1. Cont toprol 25 xl, micardis 80-12.5 mg qd  1 and 2.  Cont tresiba, ozempic, rec do novolog tid with meals, janumet  F/u endocrine in [place] upcoming as well as eye exam  Do foot exam in future  Check urine protein today  Consider adding fish oil in future   3.  Had flu shot 08/2017  pna 23 vaccine had but due again pt to check with [facility] in [place] to see if needs  Tdap had today  Disc shingrix at f/u   mammo get records [place] Pap get records [place]  Colonoscopy get records [place] sch one upcoming in [place] Check CBC today, hep C, TSH   PCP in [facility]  GI Dr. Kristine Royal    Provider: Dr. French Ana McLean-Scocuzza-Internal Medicine

## 2018-02-01 ENCOUNTER — Other Ambulatory Visit (INDEPENDENT_AMBULATORY_CARE_PROVIDER_SITE_OTHER): Payer: Commercial Managed Care - PPO

## 2018-02-01 DIAGNOSIS — Z13818 Encounter for screening for other digestive system disorders: Secondary | ICD-10-CM | POA: Diagnosis not present

## 2018-02-01 DIAGNOSIS — Z1329 Encounter for screening for other suspected endocrine disorder: Secondary | ICD-10-CM

## 2018-02-01 DIAGNOSIS — Z1159 Encounter for screening for other viral diseases: Secondary | ICD-10-CM

## 2018-02-01 NOTE — Addendum Note (Signed)
Addended by: Penne LashWIGGINS, [place] N on: 02/01/2018 10:53 AM   Modules accepted: Orders

## 2018-02-01 NOTE — Addendum Note (Signed)
Addended by: WIGGINS, Neveah Bang N on: 02/01/2018 10:53 AM   Modules accepted: Orders  

## 2018-02-02 LAB — CBC WITH DIFFERENTIAL/PLATELET
Basophils Absolute: 74 cells/uL (ref 0–200)
Basophils Relative: 1 %
EOS PCT: 2.3 %
Eosinophils Absolute: 170 cells/uL (ref 15–500)
HEMATOCRIT: 45.8 % — AB (ref 35.0–45.0)
Hemoglobin: 14.9 g/dL (ref 11.7–15.5)
LYMPHS ABS: 2042 {cells}/uL (ref 850–3900)
MCH: 26.6 pg — ABNORMAL LOW (ref 27.0–33.0)
MCHC: 32.5 g/dL (ref 32.0–36.0)
MCV: 81.6 fL (ref 80.0–100.0)
MPV: 11.6 fL (ref 7.5–12.5)
Monocytes Relative: 7.9 %
NEUTROS PCT: 61.2 %
Neutro Abs: 4529 cells/uL (ref 1500–7800)
PLATELETS: 250 10*3/uL (ref 140–400)
RBC: 5.61 10*6/uL — AB (ref 3.80–5.10)
RDW: 14.3 % (ref 11.0–15.0)
Total Lymphocyte: 27.6 %
WBC mixed population: 585 cells/uL (ref 200–950)
WBC: 7.4 10*3/uL (ref 3.8–10.8)

## 2018-02-02 LAB — HEPATITIS C ANTIBODY
Hepatitis C Ab: NONREACTIVE
SIGNAL TO CUT-OFF: 0.42 (ref <1.00)

## 2018-02-02 LAB — TSH: TSH: 1.05 mIU/L (ref 0.40–4.50)

## 2018-03-29 ENCOUNTER — Other Ambulatory Visit: Payer: Self-pay | Admitting: Cardiovascular Disease

## 2018-03-29 NOTE — Telephone Encounter (Signed)
Please review for refill, Thanks !  

## 2019-01-29 ENCOUNTER — Other Ambulatory Visit: Payer: Self-pay | Admitting: Cardiovascular Disease

## 2019-02-21 ENCOUNTER — Other Ambulatory Visit: Payer: Self-pay

## 2019-02-21 ENCOUNTER — Ambulatory Visit: Payer: Commercial Managed Care - PPO | Admitting: Internal Medicine

## 2019-02-21 ENCOUNTER — Telehealth (INDEPENDENT_AMBULATORY_CARE_PROVIDER_SITE_OTHER): Payer: Commercial Managed Care - PPO | Admitting: Internal Medicine

## 2019-02-21 DIAGNOSIS — R0982 Postnasal drip: Secondary | ICD-10-CM | POA: Diagnosis not present

## 2019-02-21 DIAGNOSIS — J309 Allergic rhinitis, unspecified: Secondary | ICD-10-CM | POA: Diagnosis not present

## 2019-02-21 MED ORDER — FLUTICASONE PROPIONATE 50 MCG/ACT NA SUSP: 2.0000 | Freq: Every day | NASAL | 12 refills | Status: DC | PRN

## 2019-02-21 MED ORDER — IPRATROPIUM BROMIDE 0.06 % NA SOLN: 2.0000 | Freq: Three times a day (TID) | NASAL | 12 refills | Status: DC

## 2019-02-21 MED ORDER — SALINE SPRAY 0.65 % NA SOLN: 2.0000 | Freq: Every day | NASAL | 12 refills | Status: DC | PRN

## 2019-02-21 MED ORDER — LORATADINE 10 MG PO TABS: 10.0000 mg | ORAL_TABLET | Freq: Every day | ORAL | 3 refills | Status: DC | PRN

## 2019-02-21 NOTE — Progress Notes (Signed)
Virtual Visit via Video Note had to be converted to telephone visit due to pt camera not available on phone  I connected with@ on 02/21/19 at 11:30 AM EDT by a video enabled telemedicine application and verified that I am speaking with the correct person using two identifiers. Patient consented   Location patient: home Location provider:work  Persons participating in the virtual visit: patient, provider  I discussed the limitations of evaluation and management by telemedicine and the availability of in person appointments. The patient expressed understanding and agreed to proceed.   HPI: C/o dry cough, pnd and runny nose/blowing nose her sister who is present at her home Sonya Sanchez was concerned. She tries to drink water which helps. No recent travel to [place] since 10/2019 no concerning sx's of COVID 19. She does report possible allergies to pollen, grass. She used to take Allegra/claritin in the past which helped with sx's.    ROS: See pertinent positives and negatives per HPI.  Past Medical History:  Diagnosis Date  . Calculus of kidney   . Diabetes mellitus without complication (HCC)   . DVT (deep venous thrombosis) (HCC)    a. on Eliquis  . Family history of early CAD    a. parents passing in their 46's from CAD  . Gout, unspecified   . Heart murmur   . Hyperlipidemia   . Hypertension   . Morbid obesity (HCC)   . Normal cardiac stress test    a. equivocal study, sig soft tissue artifact present, no chest discomfort or ECG changes, perfusion images suggest mod sized region of mild reversible perfusion defect. Findings may be 2/2 shifting soft tissue attenuation, but cannot r/o ischemia, EF 72%  . Phlebitis and thrombophlebitis of other deep vessels of lower extremities   . Phlebitis and thrombophlebitis of other deep vessels of lower extremities   . Pulmonary emboli (HCC)    a. on Eliquis  . Spinal stenosis, unspecified region other than cervical   . Tobacco abuse   . Type II or  unspecified type diabetes mellitus without mention of complication, uncontrolled   . Unspecified sleep apnea   . Urinary tract infection, site not specified     Past Surgical History:  Procedure Laterality Date  . ABDOMINAL HYSTERECTOMY     hyperplasia of endometrium  . ANKLE SURGERY     fracture s/p pin, right ankle  . APPENDECTOMY    . CHOLECYSTECTOMY    . COLECTOMY     temporary colostomy, now reversed  . INTESTINAL BYPASS     ovarian cyst ruptured, led to perforated intestine  . STOMACH SURGERY      Family History  Problem Relation Age of Onset  . Heart attack Mother 78  . Cancer Mother        breast  . Heart attack Father 40  . Arthritis Sister     SOCIAL HX: from [place] sister living with her now    Current Outpatient Medications:  .  albuterol (PROAIR HFA) 108 (90 Base) MCG/ACT inhaler, Inhale 1-2 puffs into the lungs every 6 (six) hours as needed for wheezing or shortness of breath., Disp: 1 Inhaler, Rfl: 1 .  allopurinol (ZYLOPRIM) 300 MG tablet, Take 300 mg by mouth daily., Disp: , Rfl:  .  celecoxib (CELEBREX) 50 MG capsule, Take 50 mg by mouth 2 (two) times daily., Disp: , Rfl:  .  Cholecalciferol (VITAMIN D) 1000 UNITS capsule, Take 2,000 Units by mouth daily., Disp: , Rfl:  .  doxycycline (  VIBRA-TABS) 100 MG tablet, Take 1 tablet (100 mg total) by mouth 2 (two) times daily., Disp: 14 tablet, Rfl: 0 .  ELIQUIS 5 MG TABS tablet, TAKE 1 TABLET(5 MG) BY MOUTH TWICE DAILY, Disp: 60 tablet, Rfl: 0 .  esomeprazole (NEXIUM) 40 MG capsule, Take 40 mg by mouth daily before breakfast., Disp: , Rfl:  .  fenofibrate 54 MG tablet, Take 54 mg by mouth daily., Disp: , Rfl:  .  fluticasone (FLONASE) 50 MCG/ACT nasal spray, Place 2 sprays into both nostrils daily as needed for allergies or rhinitis., Disp: 16 g, Rfl: 12 .  furosemide (LASIX) 40 MG tablet, Take 1 tablet (40 mg total) by mouth daily., Disp: 30 tablet, Rfl: 11 .  Insulin Aspart (NOVOLOG FLEXPEN [place]), Inject 20 Units  into the skin 2 (two) times daily., Disp: , Rfl:  .  Insulin Degludec (TRESIBA FLEXTOUCH [place]), Inject 150 Units into the skin daily., Disp: , Rfl:  .  ipratropium (ATROVENT) 0.06 % nasal spray, Place 2 sprays into both nostrils 3 (three) times daily., Disp: 15 mL, Rfl: 12 .  JANUMET 50-500 MG tablet, TK 1 T PO BID, Disp: , Rfl: 1 .  loratadine (CLARITIN) 10 MG tablet, Take 1 tablet (10 mg total) by mouth daily as needed for allergies., Disp: 90 tablet, Rfl: 3 .  metoprolol succinate (TOPROL-XL) 25 MG 24 hr tablet, TAKE 1 TABLET(25 MG) BY MOUTH DAILY, Disp: 30 tablet, Rfl: 1 .  Multiple Vitamin (MULTIVITAMIN) tablet, Take 1 tablet by mouth daily., Disp: , Rfl:  .  OZEMPIC 1 MG/DOSE SOPN, INJECT 1 MG SUBCUNTANEOUSLY ONCE A WEEK, Disp: , Rfl: 0 .  potassium chloride (K-DUR) 10 MEQ tablet, Take 1 tablet (10 mEq total) by mouth daily as needed (Daily with Lasix (Furosemide))., Disp: 30 tablet, Rfl: 11 .  rosuvastatin (CRESTOR) 20 MG tablet, Take 1 tablet (20 mg total) by mouth daily., Disp: 30 tablet, Rfl: 11 .  sodium chloride (OCEAN) 0.65 % SOLN nasal spray, Place 2 sprays into both nostrils daily as needed for congestion. 1st before flonase and atrovent, Disp: 1 Bottle, Rfl: 12 .  telmisartan-hydrochlorothiazide (MICARDIS HCT) 80-12.5 MG tablet, Take 1 tablet by mouth daily., Disp: 30 tablet, Rfl: 11  EXAM: no video available  VITALS per patient if applicable: unattainable   GENERAL:unattainable per pt not in distress   HEENT: unattainable   NECK: unattainable   LUNGS: per pt no signs of respiratory distress, no sob   CV: unattainable   MS: unattainable   PSYCH/NEURO: pleasant and cooperative, no obvious depression or anxiety, speech and thought processing grossly intact  ASSESSMENT AND PLAN:  Discussed the following assessment and plan:  Post-nasal drip - Plan: sodium chloride (OCEAN) 0.65 % SOLN nasal spray, fluticasone (FLONASE) 50 MCG/ACT nasal spray, ipratropium (ATROVENT) 0.06 %  nasal spray, loratadine (CLARITIN) 10 MG tablet  Allergic rhinitis, unspecified seasonality, unspecified trigger - Plan: sodium chloride (OCEAN) 0.65 % SOLN nasal spray, fluticasone (FLONASE) 50 MCG/ACT nasal spray, ipratropium (ATROVENT) 0.06 % nasal spray, loratadine (CLARITIN) 10 MG tablet  See plan as above pt to my chart in 2 weeks if not better  F/u in 3-4 months otherwise regular f/u   Will CC CMA Brock her Earleen Reaper is not working due to password issues   I discussed the assessment and treatment plan with the patient. The patient was provided an opportunity to ask questions and all were answered. The patient agreed with the plan and demonstrated an understanding of the instructions.   The  patient was advised to call back or seek an in-person evaluation if the symptoms worsen or if the condition fails to improve as anticipated.  I provided 10 minutes of non-face-to-face time during this encounter via telephone only not video   Bevelyn Buckles, MD

## 2019-02-21 NOTE — Patient Instructions (Signed)
Please use claritin at night  Nasal saline then flonase and atrovent for post nasal drip  I sent all the medications you your pharmacy   My chart or call back in 2 weeks if not better otherwise follow up 3-4 months office visit call back to schedule  TMS

## 2019-03-05 ENCOUNTER — Telehealth: Payer: Self-pay | Admitting: Family Medicine

## 2019-03-05 NOTE — Telephone Encounter (Signed)
Patient is awaiting to move back to [place]. She stated she will contact us if and when she goes back or will schedule PE.

## 2019-03-05 NOTE — Telephone Encounter (Signed)
Patient needs follow up scheduled - sometime in next few months  Due for:  A1c and other labs  Pap smear  Opthalmology exam for diabetics  Colonoscopy  Mammogram  Foot exam

## 2019-03-07 ENCOUNTER — Telehealth: Payer: Self-pay | Admitting: Internal Medicine

## 2019-03-07 NOTE — Telephone Encounter (Signed)
Please sch f/u visit 8 or 07/2019   TMS

## 2019-03-15 ENCOUNTER — Ambulatory Visit: Payer: Commercial Managed Care - PPO | Admitting: Podiatry

## 2019-03-16 ENCOUNTER — Encounter: Payer: Self-pay | Admitting: Podiatry

## 2019-03-16 ENCOUNTER — Other Ambulatory Visit: Payer: Self-pay

## 2019-03-16 ENCOUNTER — Ambulatory Visit: Payer: Commercial Managed Care - PPO | Admitting: Podiatry

## 2019-03-16 VITALS — Temp 99.0°F

## 2019-03-16 DIAGNOSIS — E0843 Diabetes mellitus due to underlying condition with diabetic autonomic (poly)neuropathy: Secondary | ICD-10-CM | POA: Diagnosis not present

## 2019-03-16 DIAGNOSIS — B351 Tinea unguium: Secondary | ICD-10-CM | POA: Diagnosis not present

## 2019-03-16 DIAGNOSIS — M79676 Pain in unspecified toe(s): Secondary | ICD-10-CM

## 2019-03-16 NOTE — Progress Notes (Signed)
   SUBJECTIVE Patient with a history of diabetes mellitus presents to office today complaining of elongated, thickened nails that cause pain while ambulating in shoes.  She is unable to trim her own nails. Patient is here for further evaluation and treatment.   Past Medical History:  Diagnosis Date  . Calculus of kidney   . Diabetes mellitus without complication (HCC)   . DVT (deep venous thrombosis) (HCC)    a. on Eliquis  . Family history of early CAD    a. parents passing in their 17's from CAD  . Gout, unspecified   . Heart murmur   . Hyperlipidemia   . Hypertension   . Morbid obesity (HCC)   . Normal cardiac stress test    a. equivocal study, sig soft tissue artifact present, no chest discomfort or ECG changes, perfusion images suggest mod sized region of mild reversible perfusion defect. Findings may be 2/2 shifting soft tissue attenuation, but cannot r/o ischemia, EF 72%  . Phlebitis and thrombophlebitis of other deep vessels of lower extremities   . Phlebitis and thrombophlebitis of other deep vessels of lower extremities   . Pulmonary emboli (HCC)    a. on Eliquis  . Spinal stenosis, unspecified region other than cervical   . Tobacco abuse   . Type II or unspecified type diabetes mellitus without mention of complication, uncontrolled   . Unspecified sleep apnea   . Urinary tract infection, site not specified     OBJECTIVE General Patient is awake, alert, and oriented x 3 and in no acute distress. Derm Skin is dry and supple bilateral. Negative open lesions or macerations. Remaining integument unremarkable. Nails are tender, long, thickened and dystrophic with subungual debris, consistent with onychomycosis, 1-5 bilateral. No signs of infection noted. Vasc  DP and PT pedal pulses palpable bilaterally. Temperature gradient within normal limits.  Neuro Epicritic and protective threshold sensation diminished bilaterally.  Musculoskeletal Exam No symptomatic pedal deformities  noted bilateral. Muscular strength within normal limits.  ASSESSMENT 1. Diabetes Mellitus w/ peripheral neuropathy 2. Onychomycosis of nail due to dermatophyte bilateral 3. Pain in foot bilateral  PLAN OF CARE 1. Patient evaluated today. 2. Instructed to maintain good pedal hygiene and foot care. Stressed importance of controlling blood sugar.  3. Mechanical debridement of nails 1-5 bilaterally performed using a nail nipper. Filed with dremel without incident.  4. Return to clinic in 3 mos.   *visiting sister. She is from [place], DPM [facility]  Dr. Felecia Shelling, DPM    [address]                                        [place], [place] [address]                Office [phone]  Fax [phone]

## 2019-03-20 NOTE — Progress Notes (Deleted)
{Choose 1 Note Type (Telehealth Visit or Telephone Visit):[phone]}   I connected with  Sonya Sanchez on 03/20/19 by a video enabled telemedicine application and verified that I am speaking with the correct person using two identifiers. I discussed the limitations of evaluation and management by telemedicine. The patient expressed understanding and agreed to proceed.   Evaluation Performed:  Follow-up visit  Date:  03/20/2019   ID:  Sonya Sanchez, DOB May 24, 1956, MRN [id number]  Patient Location:  [address]  [place] [address]   Provider location:   [facility], [place] office  PCP:  McLean-Scocuzza, Pasty Spillers, MD  Cardiologist:  Sonya Sanchez   Chief Complaint:      History of Present Illness:    Sonya Sanchez is a 63 y.o. female who presents via audio/video conferencing for a telehealth visit today.   The patient does not symptoms concerning for COVID-19 infection (fever, chills, cough, or new SHORTNESS OF BREATH).   Patient has a past medical history of morbid obesity,  DVT/PE on anticoagulation,  diabetes,  prior smoking history,  hypertension,  strong family history of heart disease with parents dying in their 34s from heart attack Prior echocardiogram showed mild aortic valve sclerosis, no significant stenosis, normal LV function. Diabetes is poorly controlled, HBA1C 13.9 Stopped smoking 20 years ago who presents for routine followup after recent hospitalization  In follow-up today she reports that she is doing well Still with poor diet but trying to eat better living with her sister Sees endocrine up in [place] Hemoglobin A1c of 13 last year, no recent numbers available  Has not seen primary care since Sonya Sanchez left  Taking her Eliquis  Rarely takes Lasix and potassium  for leg swelling Urinary issue, tries not to overdo it  Denies any PND, orthopnea, worsening leg swelling abdominal bloating or weight change Some shortness of  breath when walking No chest pain on exertion  Tremendous difficulty getting around, chronic knee pain Previously seen by orthopedics up in [place]  EKG on today's visit shows normal sinus rhythm with rate 89 bpm, no significant ST or T-wave changes  Other past medical history reviewed  admission to the hospital September 2016 with bacteremia/sepsis, hypotension, rigors on admission, She had not taken her medications for least 5 days including her anticoagulation and insulin Found to have severely elevated glucose levels, possible urinary tract infection  Treated with anabiotic   Mildly elevated troponin felt secondary to demand ischemia from her sepsis .  Other past medical history  vein surgery in 2015, developed MRSA infection requiring long course of antibiotics. chronic leg edema.   chronic lower extremity edema likely from venous insufficiency, exacerbated by obesity.  history of pulmonary embolism in 2006.  she had shortness of breath and it felt like "Bronchitis". Workup in [place] confirmed PE.  Changed from warfarin to Eliquis by physician in [place]  Ultrasound August 2010 for possible cellulitis showed no DVT     Prior CV studies:   The following studies were reviewed today:    Past Medical History:  Diagnosis Date  . Calculus of kidney   . Diabetes mellitus without complication (HCC)   . DVT (deep venous thrombosis) (HCC)    a. on Eliquis  . Family history of early CAD    a. parents passing in their 96's from CAD  . Gout, unspecified   . Heart murmur   . Hyperlipidemia   . Hypertension   .  Morbid obesity (HCC)   . Normal cardiac stress test    a. equivocal study, sig soft tissue artifact present, no chest discomfort or ECG changes, perfusion images suggest mod sized region of mild reversible perfusion defect. Findings may be 2/2 shifting soft tissue attenuation, but cannot r/o ischemia, EF 72%  . Phlebitis and thrombophlebitis of other  deep vessels of lower extremities   . Phlebitis and thrombophlebitis of other deep vessels of lower extremities   . Pulmonary emboli (HCC)    a. on Eliquis  . Spinal stenosis, unspecified region other than cervical   . Tobacco abuse   . Type II or unspecified type diabetes mellitus without mention of complication, uncontrolled   . Unspecified sleep apnea   . Urinary tract infection, site not specified    Past Surgical History:  Procedure Laterality Date  . ABDOMINAL HYSTERECTOMY     hyperplasia of endometrium  . ANKLE SURGERY     fracture s/p pin, right ankle  . APPENDECTOMY    . CHOLECYSTECTOMY    . COLECTOMY     temporary colostomy, now reversed  . INTESTINAL BYPASS     ovarian cyst ruptured, led to perforated intestine  . STOMACH SURGERY       No outpatient medications have been marked as taking for the 03/23/19 encounter (Appointment) with Sonya Iba, MD.     Allergies:   Sulfa antibiotics   Social History   Tobacco Use  . Smoking status: Former Smoker    Packs/day: 1.00    Years: 10.00    Pack years: 10.00    Last attempt to quit: 12/26/1999    Years since quitting: 19.2  . Smokeless tobacco: Never Used  Substance Use Topics  . Alcohol use: Yes    Comment: socially  . Drug use: No     Current Outpatient Medications on File Prior to Visit  Medication Sig Dispense Refill  . Cholecalciferol (VITAMIN D) 1000 UNITS capsule Take 2,000 Units by mouth daily.    . diclofenac sodium (VOLTAREN) 1 % GEL APP 2 GRAMS EXT AA TID    . ELIQUIS 5 MG TABS tablet TAKE 1 TABLET(5 MG) BY MOUTH TWICE DAILY 60 tablet 0  . esomeprazole (NEXIUM) 40 MG capsule Take 40 mg by mouth daily before breakfast.    . fenofibrate 54 MG tablet Take 54 mg by mouth daily.    . fluticasone (FLONASE) 50 MCG/ACT nasal spray Place 2 sprays into both nostrils daily as needed for allergies or rhinitis. 16 g 12  . furosemide (LASIX) 40 MG tablet Take 1 tablet (40 mg total) by mouth daily. 30 tablet  11  . Insulin Aspart (NOVOLOG FLEXPEN [place]) Inject 20 Units into the skin 2 (two) times daily.    . Insulin Degludec (TRESIBA FLEXTOUCH [place]) Inject 150 Units into the skin daily.    Marland Kitchen ipratropium (ATROVENT) 0.06 % nasal spray Place 2 sprays into both nostrils 3 (three) times daily. 15 mL 12  . JANUMET 50-500 MG tablet TK 1 T PO BID  1  . loratadine (CLARITIN) 10 MG tablet Take 1 tablet (10 mg total) by mouth daily as needed for allergies. 90 tablet 3  . metoprolol succinate (TOPROL-XL) 25 MG 24 hr tablet TAKE 1 TABLET(25 MG) BY MOUTH DAILY 30 tablet 1  . mometasone (ELOCON) 0.1 % lotion APPLY AA BID FOR 2 WEEKS    . Multiple Vitamin (MULTIVITAMIN) tablet Take 1 tablet by mouth daily.    Marland Kitchen OZEMPIC 1 MG/DOSE SOPN  INJECT 1 MG SUBCUNTANEOUSLY ONCE A WEEK  0  . potassium chloride (K-DUR) 10 MEQ tablet Take 1 tablet (10 mEq total) by mouth daily as needed (Daily with Lasix (Furosemide)). 30 tablet 11  . rosuvastatin (CRESTOR) 20 MG tablet Take 1 tablet (20 mg total) by mouth daily. 30 tablet 11  . sodium chloride (OCEAN) 0.65 % SOLN nasal spray Place 2 sprays into both nostrils daily as needed for congestion. 1st before flonase and atrovent 1 Bottle 12  . telmisartan-hydrochlorothiazide (MICARDIS HCT) 80-12.5 MG tablet Take 1 tablet by mouth daily. 30 tablet 11   No current facility-administered medications on file prior to visit.      Family Hx: The patient's family history includes Arthritis in her sister; Cancer in her mother; Heart attack (age of onset: 62) in her mother; Heart attack (age of onset: 3) in her father.  ROS:   Please see the history of present illness.    ROS    Labs/Other Tests and Data Reviewed:    Recent Labs: No results found for requested labs within last 8760 hours.   Recent Lipid Panel Lab Results  Component Value Date/Time   CHOL 157 01/09/2018 09:49 AM   TRIG 371 (H) 01/09/2018 09:49 AM   HDL 33 (L) 01/09/2018 09:49 AM   CHOLHDL 4.8 (H) 01/09/2018 09:49 AM    CHOLHDL 6 08/03/2013 11:47 AM   LDLCALC 50 01/09/2018 09:49 AM   LDLDIRECT 138.5 02/13/2013 11:23 AM    Wt Readings from Last 3 Encounters:  01/27/18 (!) 300 lb 3.2 oz (136.2 kg)  01/19/18 (!) 302 lb 9.6 oz (137.3 kg)  01/09/18 (!) 306 lb 4 oz (138.9 kg)     Exam:    Vital Signs: Vital signs may also be detailed in the HPI There were no vitals taken for this visit.  Wt Readings from Last 3 Encounters:  01/27/18 (!) 300 lb 3.2 oz (136.2 kg)  01/19/18 (!) 302 lb 9.6 oz (137.3 kg)  01/09/18 (!) 306 lb 4 oz (138.9 kg)   Temp Readings from Last 3 Encounters:  03/16/19 99 F (37.2 C)  01/27/18 98.2 F (36.8 C) (Oral)  01/19/18 (!) 100.6 F (38.1 C) (Oral)   BP Readings from Last 3 Encounters:  01/27/18 130/78  01/19/18 (!) 176/80  01/09/18 140/80   Pulse Readings from Last 3 Encounters:  01/27/18 98  01/19/18 (!) 113  01/09/18 89     Well nourished, well developed female in no acute distress. Constitutional:  oriented to person, place, and time. No distress.  Head: Normocephalic and atraumatic.  Eyes:  no discharge. No scleral icterus.  Neck: Normal range of motion. Neck supple.  Pulmonary/Chest: No audible wheezing, no distress, appears comfortable Musculoskeletal: Normal range of motion.  no  tenderness or deformity.  Neurological:   Coordination normal. Full exam not performed Skin:  No rash Psychiatric:  normal mood and affect. behavior is normal. Thought content normal.    ASSESSMENT & PLAN:    No diagnosis found.   COVID-19 Education: The signs and symptoms of COVID-19 were discussed with the patient and how to seek care for testing (follow up with PCP or arrange E-visit).  The importance of social distancing was discussed today.  Patient Risk:   After full review of this patients clinical status, I feel that they are at least moderate risk at this time.  Time:   Today, I have spent 25 minutes with the patient with telehealth technology discussing the  cardiac and medical  problems/diagnoses detailed above   10 min spent reviewing the chart prior to patient visit today   Medication Adjustments/Labs and Tests Ordered: Current medicines are reviewed at length with the patient today.  Concerns regarding medicines are outlined above.   Tests Ordered: No tests ordered   Medication Changes: No changes made   Disposition: Follow-up in 6 months   Signed, Julien Nordmannimothy Gollan, MD  03/20/2019 6:13 PM    [facility] [place] [facility] [address], [place], [place] [id number]

## 2019-03-23 ENCOUNTER — Other Ambulatory Visit: Payer: Self-pay

## 2019-03-23 ENCOUNTER — Telehealth: Payer: Commercial Managed Care - PPO | Admitting: Cardiovascular Disease

## 2019-03-27 NOTE — Progress Notes (Signed)
Virtual Visit via Video Note   This visit type was conducted due to national recommendations for restrictions regarding the COVID-19 Pandemic (e.g. social distancing) in an effort to limit this patient's exposure and mitigate transmission in our community.  Due to her co-morbid illnesses, this patient is at least at moderate risk for complications without adequate follow up.  This format is felt to be most appropriate for this patient at this time.  All issues noted in this document were discussed and addressed.  A limited physical exam was performed with this format.  Please refer to the patient's chart for her consent to telehealth for [facility].   I connected with  Sonya Sanchez on 03/28/19 by a video enabled telemedicine application and verified that I am speaking with the correct person using two identifiers. I discussed the limitations of evaluation and management by telemedicine. The patient expressed understanding and agreed to proceed.   Evaluation Performed:  Follow-up visit  Date:  03/28/2019   ID:  Sonya Sanchez, DOB 06/01/1956, MRN [id number]  Patient Location:  [address]  [place] [address]   Provider location:   [facility], [place] office  PCP:  McLean-Scocuzza, Pasty Spillers, MD  Cardiologist:  Fonnie Mu   Chief Complaint:  Gait instability    History of Present Illness:    Sonya Sanchez is a 63 y.o. female who presents via audio/video conferencing for a telehealth visit today.   The patient does not symptoms concerning for COVID-19 infection (fever, chills, cough, or new SHORTNESS OF BREATH).   Patient has a past medical history of morbid obesity,  DVT/PE on anticoagulation, eliquis diabetes,  prior smoking history,  hypertension,  strong family history of heart disease with parents dying in their 90s from heart attack Prior echocardiogram showed mild aortic valve sclerosis, no significant stenosis, normal LV function. Diabetes  is poorly controlled, HBA1C 13.9 OSA on CPAP Stopped smoking 20 years ago who presents for routine followup after recent hospitalization  strugling with weight, Best was 298,   No new labs HBA1C "9 or 10" she thinks   fall in Feb 2020 [facility] with a cane Had no cane or phone, could not get up Paramedics had to come ove   living with her sister Lives in [place]  Taking her Eliquis   Lasix and potassium  for leg swelling, PRN Some shortness of breath when walking  Denies any PND, orthopnea, worsening leg swelling abdominal bloating or weight change No chest pain on exertion  Tremendous difficulty getting around, chronic knee pain Previously seen by orthopedics up in [place]   Other past medical history reviewed  admission to the hospital September 2016 with bacteremia/sepsis, hypotension, rigors on admission, She had not taken her medications for least 5 days including her anticoagulation and insulin Found to have severely elevated glucose levels, possible urinary tract infection  Treated with anabiotic   Mildly elevated troponin felt secondary to demand ischemia from her sepsis .  Other past medical history  vein surgery in 2015, developed MRSA infection requiring long course of antibiotics. chronic leg edema.   chronic lower extremity edema likely from venous insufficiency, exacerbated by obesity.  history of pulmonary embolism in 2006.  she had shortness of breath and it felt like "Bronchitis". Workup in [place] confirmed PE.  Changed from warfarin to Eliquis by physician in [place]  Ultrasound August 2010 for possible cellulitis showed no DVT   Prior CV  studies:   The following studies were reviewed today:    Past Medical History:  Diagnosis Date  . Calculus of kidney   . Diabetes mellitus without complication (HCC)   . DVT (deep venous thrombosis) (HCC)    a. on Eliquis  . Family history of early CAD    a. parents passing in their  34's from CAD  . Gout, unspecified   . Heart murmur   . Hyperlipidemia   . Hypertension   . Morbid obesity (HCC)   . Normal cardiac stress test    a. equivocal study, sig soft tissue artifact present, no chest discomfort or ECG changes, perfusion images suggest mod sized region of mild reversible perfusion defect. Findings may be 2/2 shifting soft tissue attenuation, but cannot r/o ischemia, EF 72%  . Phlebitis and thrombophlebitis of other deep vessels of lower extremities   . Phlebitis and thrombophlebitis of other deep vessels of lower extremities   . Pulmonary emboli (HCC)    a. on Eliquis  . Spinal stenosis, unspecified region other than cervical   . Tobacco abuse   . Type II or unspecified type diabetes mellitus without mention of complication, uncontrolled   . Unspecified sleep apnea   . Urinary tract infection, site not specified    Past Surgical History:  Procedure Laterality Date  . ABDOMINAL HYSTERECTOMY     hyperplasia of endometrium  . ANKLE SURGERY     fracture s/p pin, right ankle  . APPENDECTOMY    . CHOLECYSTECTOMY    . COLECTOMY     temporary colostomy, now reversed  . INTESTINAL BYPASS     ovarian cyst ruptured, led to perforated intestine  . STOMACH SURGERY       Current Meds  Medication Sig  . Cholecalciferol (VITAMIN D) 1000 UNITS capsule Take 2,000 Units by mouth daily.  . diclofenac sodium (VOLTAREN) 1 % GEL APP 2 GRAMS EXT AA TID  . ELIQUIS 5 MG TABS tablet TAKE 1 TABLET(5 MG) BY MOUTH TWICE DAILY  . esomeprazole (NEXIUM) 40 MG capsule Take 40 mg by mouth daily before breakfast.  . fenofibrate 54 MG tablet Take 54 mg by mouth daily.  . fluticasone (FLONASE) 50 MCG/ACT nasal spray Place 2 sprays into both nostrils daily as needed for allergies or rhinitis.  . furosemide (LASIX) 40 MG tablet Take 1 tablet (40 mg total) by mouth daily.  . Insulin Aspart (NOVOLOG FLEXPEN [place]) Inject 20 Units into the skin 2 (two) times daily.  . Insulin Degludec (TRESIBA  FLEXTOUCH ) Inject 150 Units into the skin daily.  Marland Kitchen ipratropium (ATROVENT) 0.06 % nasal spray Place 2 sprays into both nostrils 3 (three) times daily.  Marland Kitchen JANUMET 50-500 MG tablet TK 1 T PO BID  . loratadine (CLARITIN) 10 MG tablet Take 1 tablet (10 mg total) by mouth daily as needed for allergies.  . metoprolol succinate (TOPROL-XL) 25 MG 24 hr tablet TAKE 1 TABLET(25 MG) BY MOUTH DAILY  . mometasone (ELOCON) 0.1 % lotion APPLY AA BID FOR 2 WEEKS  . Multiple Vitamin (MULTIVITAMIN) tablet Take 1 tablet by mouth daily.  Marland Kitchen OZEMPIC 1 MG/DOSE SOPN INJECT 1 MG SUBCUNTANEOUSLY ONCE A WEEK  . potassium chloride (K-DUR) 10 MEQ tablet Take 1 tablet (10 mEq total) by mouth daily as needed (Daily with Lasix (Furosemide)).  . rosuvastatin (CRESTOR) 20 MG tablet Take 1 tablet (20 mg total) by mouth daily.  . sodium chloride (OCEAN) 0.65 % SOLN nasal spray Place 2 sprays into both nostrils  daily as needed for congestion. 1st before flonase and atrovent  . telmisartan-hydrochlorothiazide (MICARDIS HCT) 80-12.5 MG tablet Take 1 tablet by mouth daily.     Allergies:   Sulfa antibiotics   Social History   Tobacco Use  . Smoking status: Former Smoker    Packs/day: 1.00    Years: 10.00    Pack years: 10.00    Last attempt to quit: 12/26/1999    Years since quitting: 19.2  . Smokeless tobacco: Never Used  Substance Use Topics  . Alcohol use: Yes    Comment: socially  . Drug use: No     Current Outpatient Medications on File Prior to Visit  Medication Sig Dispense Refill  . Cholecalciferol (VITAMIN D) 1000 UNITS capsule Take 2,000 Units by mouth daily.    . diclofenac sodium (VOLTAREN) 1 % GEL APP 2 GRAMS EXT AA TID    . ELIQUIS 5 MG TABS tablet TAKE 1 TABLET(5 MG) BY MOUTH TWICE DAILY 60 tablet 0  . esomeprazole (NEXIUM) 40 MG capsule Take 40 mg by mouth daily before breakfast.    . fenofibrate 54 MG tablet Take 54 mg by mouth daily.    . fluticasone (FLONASE) 50 MCG/ACT nasal spray Place 2 sprays  into both nostrils daily as needed for allergies or rhinitis. 16 g 12  . furosemide (LASIX) 40 MG tablet Take 1 tablet (40 mg total) by mouth daily. 30 tablet 11  . Insulin Aspart (NOVOLOG FLEXPEN [place]) Inject 20 Units into the skin 2 (two) times daily.    . Insulin Degludec (TRESIBA FLEXTOUCH [place]) Inject 150 Units into the skin daily.    Marland Kitchen. ipratropium (ATROVENT) 0.06 % nasal spray Place 2 sprays into both nostrils 3 (three) times daily. 15 mL 12  . JANUMET 50-500 MG tablet TK 1 T PO BID  1  . loratadine (CLARITIN) 10 MG tablet Take 1 tablet (10 mg total) by mouth daily as needed for allergies. 90 tablet 3  . metoprolol succinate (TOPROL-XL) 25 MG 24 hr tablet TAKE 1 TABLET(25 MG) BY MOUTH DAILY 30 tablet 1  . mometasone (ELOCON) 0.1 % lotion APPLY AA BID FOR 2 WEEKS    . Multiple Vitamin (MULTIVITAMIN) tablet Take 1 tablet by mouth daily.    Marland Kitchen. OZEMPIC 1 MG/DOSE SOPN INJECT 1 MG SUBCUNTANEOUSLY ONCE A WEEK  0  . potassium chloride (K-DUR) 10 MEQ tablet Take 1 tablet (10 mEq total) by mouth daily as needed (Daily with Lasix (Furosemide)). 30 tablet 11  . rosuvastatin (CRESTOR) 20 MG tablet Take 1 tablet (20 mg total) by mouth daily. 30 tablet 11  . sodium chloride (OCEAN) 0.65 % SOLN nasal spray Place 2 sprays into both nostrils daily as needed for congestion. 1st before flonase and atrovent 1 Bottle 12  . telmisartan-hydrochlorothiazide (MICARDIS HCT) 80-12.5 MG tablet Take 1 tablet by mouth daily. 30 tablet 11   No current facility-administered medications on file prior to visit.      Family Hx: The patient's family history includes Arthritis in her sister; Cancer in her mother; Heart attack (age of onset: 5052) in her mother; Heart attack (age of onset: 2959) in her father.  ROS:   Please see the history of present illness.    Review of Systems  Constitutional: Negative.   Respiratory: Negative.   Cardiovascular: Negative.   Gastrointestinal: Negative.   Musculoskeletal: Positive for joint  pain.       Gait instability  Neurological: Negative.   Psychiatric/Behavioral: Negative.   All other  systems reviewed and are negative.     Labs/Other Tests and Data Reviewed:    Recent Labs: No results found for requested labs within last 8760 hours.   Recent Lipid Panel Lab Results  Component Value Date/Time   CHOL 157 01/09/2018 09:49 AM   TRIG 371 (H) 01/09/2018 09:49 AM   HDL 33 (L) 01/09/2018 09:49 AM   CHOLHDL 4.8 (H) 01/09/2018 09:49 AM   CHOLHDL 6 08/03/2013 11:47 AM   LDLCALC 50 01/09/2018 09:49 AM   LDLDIRECT 138.5 02/13/2013 11:23 AM    Wt Readings from Last 3 Encounters:  01/27/18 (!) 300 lb 3.2 oz (136.2 kg)  01/19/18 (!) 302 lb 9.6 oz (137.3 kg)  01/09/18 (!) 306 lb 4 oz (138.9 kg)     Exam:    Vital Signs: Vital signs may also be detailed in the HPI There were no vitals taken for this visit.  Wt Readings from Last 3 Encounters:  01/27/18 (!) 300 lb 3.2 oz (136.2 kg)  01/19/18 (!) 302 lb 9.6 oz (137.3 kg)  01/09/18 (!) 306 lb 4 oz (138.9 kg)   Temp Readings from Last 3 Encounters:  03/16/19 99 F (37.2 C)  01/27/18 98.2 F (36.8 C) (Oral)  01/19/18 (!) 100.6 F (38.1 C) (Oral)   BP Readings from Last 3 Encounters:  01/27/18 130/78  01/19/18 (!) 176/80  01/09/18 140/80   Pulse Readings from Last 3 Encounters:  01/27/18 98  01/19/18 (!) 113  01/09/18 89    130/70, pulse 90 resp 16  Well nourished, well developed female in no acute distress. Constitutional:  oriented to person, place, and time. No distress.     ASSESSMENT & PLAN:    Poorly controlled type 2 diabetes mellitus with circulatory disorder (HCC) Continues to have poorly controlled diabetes Managed by primary care, she is working on her diet  Severe obesity (BMI >= 40) (HCC) We have encouraged continued exercise, careful diet management in an effort to lose weight.  Exercise is limited by arthritides and gait instability  Other pulmonary embolism without acute cor  pulmonale, unspecified chronicity (HCC) On elquis  SOB (shortness of breath) Chronic issue, secondary to deconditioning, morbid obesity  Essential hypertension Blood pressure is well controlled on today's visit. No changes made to the medications. Stable  Hyperlipidemia, unspecified hyperlipidemia type Numbers previously at goal, no recent numbers available She may call primary care to see if she can have a lab panel done before she goes back to [place]  Aortic valve stenosis, etiology of cardiac valve disease unspecified Likely sclerosis without stenosis Was told she had a murmur by Dr. in [place]   COVID-19 Education: The signs and symptoms of COVID-19 were discussed with the patient and how to seek care for testing (follow up with PCP or arrange E-visit).  The importance of social distancing was discussed today.  Patient Risk:   After full review of this patients clinical status, I feel that they are at least moderate risk at this time.  Time:   Today, I have spent 25 minutes with the patient with telehealth technology discussing the cardiac and medical problems/diagnoses detailed above   10 min spent reviewing the chart prior to patient visit today   Medication Adjustments/Labs and Tests Ordered: Current medicines are reviewed at length with the patient today.  Concerns regarding medicines are outlined above.   Tests Ordered: No tests ordered   Medication Changes: No changes made   Disposition: Follow-up in 12 months   Signed,  Julien Nordmann, MD  03/28/2019 3:37 PM    [facility] [address], [place], [place] [address]

## 2019-03-28 ENCOUNTER — Other Ambulatory Visit: Payer: Self-pay

## 2019-03-28 ENCOUNTER — Telehealth (INDEPENDENT_AMBULATORY_CARE_PROVIDER_SITE_OTHER): Payer: Commercial Managed Care - PPO | Admitting: Cardiovascular Disease

## 2019-03-28 DIAGNOSIS — I2699 Other pulmonary embolism without acute cor pulmonale: Secondary | ICD-10-CM

## 2019-03-28 DIAGNOSIS — E1159 Type 2 diabetes mellitus with other circulatory complications: Secondary | ICD-10-CM

## 2019-03-28 DIAGNOSIS — I10 Essential (primary) hypertension: Secondary | ICD-10-CM | POA: Diagnosis not present

## 2019-03-28 DIAGNOSIS — E785 Hyperlipidemia, unspecified: Secondary | ICD-10-CM | POA: Diagnosis not present

## 2019-03-28 DIAGNOSIS — E1165 Type 2 diabetes mellitus with hyperglycemia: Secondary | ICD-10-CM

## 2019-03-28 DIAGNOSIS — I350 Nonrheumatic aortic (valve) stenosis: Secondary | ICD-10-CM

## 2019-03-28 DIAGNOSIS — R0602 Shortness of breath: Secondary | ICD-10-CM

## 2019-03-28 NOTE — Patient Instructions (Signed)

## 2019-05-09 ENCOUNTER — Ambulatory Visit: Payer: Commercial Managed Care - PPO | Admitting: Internal Medicine

## 2019-05-11 ENCOUNTER — Encounter: Payer: Self-pay | Admitting: Internal Medicine

## 2019-05-11 ENCOUNTER — Other Ambulatory Visit: Payer: Self-pay

## 2019-05-11 ENCOUNTER — Ambulatory Visit (INDEPENDENT_AMBULATORY_CARE_PROVIDER_SITE_OTHER): Payer: Commercial Managed Care - PPO | Admitting: Internal Medicine

## 2019-05-11 DIAGNOSIS — E1159 Type 2 diabetes mellitus with other circulatory complications: Secondary | ICD-10-CM

## 2019-05-11 DIAGNOSIS — K581 Irritable bowel syndrome with constipation: Secondary | ICD-10-CM

## 2019-05-11 DIAGNOSIS — I10 Essential (primary) hypertension: Secondary | ICD-10-CM

## 2019-05-11 DIAGNOSIS — E785 Hyperlipidemia, unspecified: Secondary | ICD-10-CM | POA: Diagnosis not present

## 2019-05-11 DIAGNOSIS — E1165 Type 2 diabetes mellitus with hyperglycemia: Secondary | ICD-10-CM

## 2019-05-11 DIAGNOSIS — E559 Vitamin D deficiency, unspecified: Secondary | ICD-10-CM

## 2019-05-11 DIAGNOSIS — Z1329 Encounter for screening for other suspected endocrine disorder: Secondary | ICD-10-CM

## 2019-05-11 MED ORDER — LINACLOTIDE 72 MCG PO CAPS: 72.0000 ug | ORAL_CAPSULE | Freq: Every day | ORAL | 0 refills | Status: DC

## 2019-05-11 NOTE — Patient Instructions (Addendum)
Linaclotide oral capsules What is this medicine? LINACLOTIDE (lin a KLOE tide) is used to treat irritable bowel syndrome (IBS) with constipation as the main problem. It may also be used for relief of chronic constipation. This medicine may be used for other purposes; ask your health care provider or pharmacist if you have questions. COMMON BRAND NAME(S): Linzess What should I tell my health care provider before I take this medicine? They need to know if you have any of these conditions: -history of stool (fecal) impaction -now have diarrhea or have diarrhea often -other medical condition -stomach or intestinal disease, including bowel obstruction or abdominal adhesions -an unusual or allergic reaction to linaclotide, other medicines, foods, dyes, or preservatives -pregnant or trying to get pregnant -breast-feeding How should I use this medicine? Take this medicine by mouth with a glass of water. Follow the directions on the prescription label. Do not cut, crush or chew this medicine. Take on an empty stomach, at least 30 minutes before your first meal of the day. Take your medicine at regular intervals. Do not take your medicine more often than directed. Do not stop taking except on your doctor's advice. A special MedGuide will be given to you by the pharmacist with each prescription and refill. Be sure to read this information carefully each time. Talk to your pediatrician regarding the use of this medicine in children. This medicine is not approved for use in children. Overdosage: If you think you have taken too much of this medicine contact a poison control center or emergency room at once. NOTE: This medicine is only for you. Do not share this medicine with others. What if I miss a dose? If you miss a dose, just skip that dose. Wait until your next dose, and take only that dose. Do not take double or extra doses. What may interact with this medicine? -certain medicines for bowel problems  or bladder incontinence (these can cause constipation) This list may not describe all possible interactions. Give your health care provider a list of all the medicines, herbs, non-prescription drugs, or dietary supplements you use. Also tell them if you smoke, drink alcohol, or use illegal drugs. Some items may interact with your medicine. What should I watch for while using this medicine? Visit your doctor for regular check ups. Tell your doctor if your symptoms do not get better or if they get worse. Diarrhea is a common side effect of this medicine. It often begins within 2 weeks of starting this medicine. Stop taking this medicine and call your doctor if you get severe diarrhea. Stop taking this medicine and call your doctor or go to the nearest hospital emergency room right away if you develop unusual or severe stomach-area (abdominal) pain, especially if you also have bright red, bloody stools or black stools that look like tar. What side effects may I notice from receiving this medicine? Side effects that you should report to your doctor or health care professional as soon as possible: -allergic reactions like skin rash, itching or hives, swelling of the face, lips, or tongue -black, tarry stools -bloody or watery diarrhea -new or worsening stomach pain -severe or prolonged diarrhea Side effects that usually do not require medical attention (report to your doctor or health care professional if they continue or are bothersome): -bloating -gas -loose stools This list may not describe all possible side effects. Call your doctor for medical advice about side effects. You may report side effects to FDA at 1-800-FDA-1088. Where should I keep   my medicine? Keep out of the reach of children. Store at room temperature between 20 and 25 degrees C (68 and 77 degrees F). Keep this medicine in the original container. Keep tightly closed in a dry place. Do not remove the desiccant packet from the bottle,  it helps to protect your medicine from moisture. Throw away any unused medicine after the expiration date. NOTE: This sheet is a summary. It may not cover all possible information. If you have questions about this medicine, talk to your doctor, pharmacist, or health care provider.  2019 Elsevier/Gold Standard (2015-12-18 12:17:04)  Irritable Bowel Syndrome, Adult  Irritable bowel syndrome (IBS) is a group of symptoms that affects the organs responsible for digestion (gastrointestinal or GI tract). IBS is not one specific disease. To regulate how the GI tract works, the body sends signals back and forth between the intestines and the brain. If you have IBS, there may be a problem with these signals. As a result, the GI tract does not function normally. The intestines may become more sensitive and overreact to certain things. This may be especially true when you eat certain foods or when you are under stress. There are four types of IBS. These may be determined based on the consistency of your stool (feces):  IBS with diarrhea.  IBS with constipation.  Mixed IBS.  Unsubtyped IBS. It is important to know which type of IBS you have. Certain treatments are more likely to be helpful for certain types of IBS. What are the causes? The exact cause of IBS is not known. What increases the risk? You may have a higher risk for IBS if you:  Are female.  Are younger than 3140.  Have a family history of IBS.  Have a mental health condition, such as depression, anxiety, or post-traumatic stress disorder.  Have had a bacterial infection of your GI tract. What are the signs or symptoms? Symptoms of IBS vary from person to person. The main symptom is abdominal pain or discomfort. Other symptoms usually include one or more of the following:  Diarrhea, constipation, or both.  Abdominal swelling or bloating.  Feeling full after eating a small or regular-sized meal.  Frequent gas.  Mucus in the  stool.  A feeling of having more stool left after a bowel movement. Symptoms tend to come and go. They may be triggered by stress, mental health conditions, or certain foods. How is this diagnosed? This condition may be diagnosed based on a physical exam, your medical history, and your symptoms. You may have tests, such as:  Blood tests.  Stool test.  X-rays.  CT scan.  Colonoscopy. This is a procedure in which your GI tract is viewed with a long, thin, flexible tube. How is this treated? There is no cure for IBS, but treatment can help relieve symptoms. Treatment depends on the type of IBS you have, and may include:  Changes to your diet, such as: ? Avoiding foods that cause symptoms. ? Drinking more water. ? Following a low-FODMAP (fermentable oligosaccharides, disaccharides, monosaccharides, and polyols) diet for up to 6 weeks, or as told by your health care provider. FODMAPs are sugars that are hard for some people to digest. ? Eating more fiber. ? Eating medium-sized meals at the same times every day.  Medicines. These may include: ? Fiber supplements, if you have constipation. ? Medicine to control diarrhea (antidiarrheal medicines). ? Medicine to help control muscle tightening (spasms) in your GI tract (antispasmodic medicines). ? Medicines to help  with mental health conditions, such as antidepressants or tranquilizers.  Talk therapy or counseling.  Working with a diet and nutrition specialist (dietitian) to help create a food plan that is right for you.  Managing your stress. Follow these instructions at home: Eating and drinking  Eat a healthy diet.  Eat medium-sized meals at about the same time every day. Do not eat large meals.  Gradually eat more fiber-rich foods. These include whole grains, fruits, and vegetables. This may be especially helpful if you have IBS with constipation.  Eat a diet low in FODMAPs.  Drink enough fluid to keep your urine pale  yellow.  Keep a journal of foods that seem to trigger symptoms.  Avoid foods and drinks that: ? Contain added sugar. ? Make your symptoms worse. Dairy products, caffeinated drinks, and carbonated drinks can make symptoms worse for some people. General instructions  Take over-the-counter and prescription medicines and supplements only as told by your health care provider.  Get enough exercise. Do at least 150 minutes of moderate-intensity exercise each week.  Manage your stress. Getting enough sleep and exercise can help you manage stress.  Keep all follow-up visits as told by your health care provider and therapist. This is important. Alcohol Use  Do not drink alcohol if: ? Your health care provider tells you not to drink. ? You are pregnant, may be pregnant, or are planning to become pregnant.  If you drink alcohol, limit how much you have: ? 0-1 drink a day for women. ? 0-2 drinks a day for men.  Be aware of how much alcohol is in your drink. In the [place] one drink equals one typical bottle of beer (12 oz), one-half glass of wine (5 oz), or one shot of hard liquor (1 oz). Contact a health care provider if you have:  Constant pain.  Weight loss.  Difficulty or pain when swallowing.  Diarrhea that gets worse. Get help right away if you have:  Severe abdominal pain.  Fever.  Diarrhea with symptoms of dehydration, such as dizziness or dry mouth.  Bright red blood in your stool.  Stool that is black and tarry.  Abdominal swelling.  Vomiting that does not stop.  Blood in your vomit. Summary  Irritable bowel syndrome (IBS) is not one specific disease. It is a group of symptoms that affects digestion.  Your intestines may become more sensitive and overreact to certain things. This may be especially true when you eat certain foods or when you are under stress.  There is no cure for IBS, but treatment can help relieve symptoms. This information is not intended to  replace advice given to you by your health care provider. Make sure you discuss any questions you have with your health care provider. Document Released: 11/15/2005 Document Revised: 11/08/2017 Document Reviewed: 11/08/2017 Elsevier Interactive Patient Education  2019 Elsevier Inc.  Diet for Irritable Bowel Syndrome When you have irritable bowel syndrome (IBS), it is very important to eat the foods and follow the eating habits that are best for your condition. IBS may cause various symptoms such as pain in the abdomen, constipation, or diarrhea. Choosing the right foods can help to ease the discomfort from these symptoms. Work with your health care provider and diet and nutrition specialist (dietitian) to find the eating plan that will help to control your symptoms. What are tips for following this plan?      Keep a food diary. This will help you identify foods that cause symptoms. Write  down: ? What you eat and when you eat it. ? What symptoms you have. ? When symptoms occur in relation to your meals, such as "pain in abdomen 2 hours after dinner."  Eat your meals slowly and in a relaxed setting.  Aim to eat 5-6 small meals per day. Do not skip meals.  Drink enough fluid to keep your urine pale yellow.  Ask your health care provider if you should take an over-the-counter probiotic to help restore healthy bacteria in your gut (digestive tract). ? Probiotics are foods that contain good bacteria and yeasts.  Your dietitian may have specific dietary recommendations for you based on your symptoms. He or she may recommend that you: ? Avoid foods that cause symptoms. Talk with your dietitian about other ways to get the same nutrients that are in those problem foods. ? Avoid foods with gluten. Gluten is a protein that is found in rye, wheat, and barley. ? Eat more foods that contain soluble fiber. Examples of foods with high soluble fiber include oats, seeds, and certain fruits and vegetables.  Take a fiber supplement if directed by your dietitian. ? Reduce or avoid certain foods called FODMAPs. These are foods that contain carbohydrates that are hard to digest. Ask your doctor which foods contain these carbohydrates. What foods are not recommended? The following are some foods and drinks that may make your symptoms worse:  Fatty foods, such as french fries.  Foods that contain gluten, such as pasta and cereal.  Dairy products, such as milk, cheese, and ice cream.  Chocolate.  Alcohol.  Products with caffeine, such as coffee.  Carbonated drinks, such as soda.  Foods that are high in FODMAPs. These include certain fruits and vegetables.  Products with sweeteners such as honey, high fructose corn syrup, sorbitol, and mannitol. The items listed above may not be a complete list of foods and beverages you should avoid. Contact a dietitian for more information. What foods are good sources of fiber? Your health care provider or dietitian may recommend that you eat more foods that contain fiber. Fiber can help to reduce constipation and other IBS symptoms. Add foods with fiber to your diet a little at a time so your body can get used to them. Too much fiber at one time might cause gas and swelling of your abdomen. The following are some foods that are good sources of fiber:  Berries, such as raspberries, strawberries, and blueberries.  Tomatoes.  Carrots.  Brown rice.  Oats.  Seeds, such as chia and pumpkin seeds. The items listed above may not be a complete list of recommended sources of fiber. Contact your dietitian for more options. Where to find more information  International Foundation for Functional Gastrointestinal Disorders: www.iffgd.[employer] of Diabetes and Digestive and Kidney Diseases: [employer] Summary  When you have irritable bowel syndrome (IBS), it is very important to eat the foods and follow the eating habits that are best for  your condition.  IBS may cause various symptoms such as pain in the abdomen, constipation, or diarrhea.  Choosing the right foods can help to ease the discomfort that comes from symptoms.  Keep a food diary. This will help you identify foods that cause symptoms.  Your health care provider or diet and nutrition specialist (dietitian) may recommend that you eat more foods that contain fiber. This information is not intended to replace advice given to you by your health care provider. Make sure you discuss any questions you have with  your health care provider. Document Released: 02/05/2004 Document Revised: 06/12/2018 Document Reviewed: 07/19/2017 Elsevier Interactive Patient Education  2019 [employer]  Constipation, Adult Constipation is when a person has fewer bowel movements in a week than normal, has difficulty having a bowel movement, or has stools that are dry, hard, or larger than normal. Constipation may be caused by an underlying condition. It may become worse with age if a person takes certain medicines and does not take in enough fluids. Follow these instructions at home: Eating and drinking   Eat foods that have a lot of fiber, such as fresh fruits and vegetables, whole grains, and beans.  Limit foods that are high in fat, low in fiber, or overly processed, such as french fries, hamburgers, cookies, candies, and soda.  Drink enough fluid to keep your urine clear or pale yellow. General instructions  Exercise regularly or as told by your health care provider.  Go to the restroom when you have the urge to go. Do not hold it in.  Take over-the-counter and prescription medicines only as told by your health care provider. These include any fiber supplements.  Practice pelvic floor retraining exercises, such as deep breathing while relaxing the lower abdomen and pelvic floor relaxation during bowel movements.  Watch your condition for any changes.  Keep all follow-up visits  as told by your health care provider. This is important. Contact a health care provider if:  You have pain that gets worse.  You have a fever.  You do not have a bowel movement after 4 days.  You vomit.  You are not hungry.  You lose weight.  You are bleeding from the anus.  You have thin, pencil-like stools. Get help right away if:  You have a fever and your symptoms suddenly get worse.  You leak stool or have blood in your stool.  Your abdomen is bloated.  You have severe pain in your abdomen.  You feel dizzy or you faint. This information is not intended to replace advice given to you by your health care provider. Make sure you discuss any questions you have with your health care provider. Document Released: 08/13/2004 Document Revised: 06/04/2016 Document Reviewed: 05/05/2016 Elsevier Interactive Patient Education  2019 [employer]

## 2019-05-11 NOTE — Progress Notes (Signed)
Telephone Note  I connected with Sonya Sanchez  on 05/11/19 at  3:00 PM EDT by a telephone and verified that I am speaking with the correct person using two identifiers.  Location patient: car Location provider:work  Persons participating in the virtual visit: patient, provider, pts sister  I discussed the limitations of evaluation and management by telemedicine and the availability of in person appointments. The patient expressed understanding and agreed to proceed.   HPI: 1. C/o constipation since 08/2018 prev in [place] her GI MD told her she had IBS she has tried metamucil w/o complete relief but nothing else. She will go 2 days w/o a stool denies blood in stool. Colonoscopy due upcoming but she will go back to [place] and get this done 2. DM 2 uncontrolled last A1C 13.5 needs recheck on novology 25 bid, tresiba 150 mg qd, janumet 50-500 bid, ozempic 1 mg 1x per week  3. Morbid obesity weight is 300 lbs she reports she is not exercising like she should  4. HTN BP <130/<80 on micardis 80-12.5 mg qd, lasix 40 mg qd, toprol xl 25 mg qd  5. HLD on crestor 20 mg qhs   She states she has refills on all meds a some she gets from PCP in [place]     ROS: See pertinent positives and negatives per HPI.  Past Medical History:  Diagnosis Date  . Calculus of kidney   . Diabetes mellitus without complication (HCC)   . DVT (deep venous thrombosis) (HCC)    a. on Eliquis  . Family history of early CAD    a. parents passing in their 350's from CAD  . Gout, unspecified   . Heart murmur   . Hyperlipidemia   . Hypertension   . Morbid obesity (HCC)   . Normal cardiac stress test    a. equivocal study, sig soft tissue artifact present, no chest discomfort or ECG changes, perfusion images suggest mod sized region of mild reversible perfusion defect. Findings may be 2/2 shifting soft tissue attenuation, but cannot r/o ischemia, EF 72%  . Phlebitis and thrombophlebitis of other deep vessels of lower extremities   .  Phlebitis and thrombophlebitis of other deep vessels of lower extremities   . Pulmonary emboli (HCC)    a. on Eliquis  . Spinal stenosis, unspecified region other than cervical   . Tobacco abuse   . Type II or unspecified type diabetes mellitus without mention of complication, uncontrolled   . Unspecified sleep apnea   . Urinary tract infection, site not specified     Past Surgical History:  Procedure Laterality Date  . ABDOMINAL HYSTERECTOMY     hyperplasia of endometrium  . ANKLE SURGERY     fracture s/p pin, right ankle  . APPENDECTOMY    . CHOLECYSTECTOMY    . COLECTOMY     temporary colostomy, now reversed  . INTESTINAL BYPASS     ovarian cyst ruptured, led to perforated intestine  . STOMACH SURGERY      Family History  Problem Relation Age of Onset  . Heart attack Mother [id number]  . Cancer Mother        breast  . Heart attack Father [id number]  . Arthritis Sister     SOCIAL HX: lives with sister  [place] forth to [place]    Current Outpatient Medications:  .  Cholecalciferol (VITAMIN D) 1000 UNITS capsule, Take 2,000 Units by mouth daily., Disp: , Rfl:  .  diclofenac sodium (  VOLTAREN) 1 % GEL, APP 2 GRAMS EXT AA TID, Disp: , Rfl:  .  ELIQUIS 5 MG TABS tablet, TAKE 1 TABLET(5 MG) BY MOUTH TWICE DAILY, Disp: 60 tablet, Rfl: 0 .  esomeprazole (NEXIUM) 40 MG capsule, Take 40 mg by mouth daily before breakfast., Disp: , Rfl:  .  fenofibrate 54 MG tablet, Take 54 mg by mouth daily., Disp: , Rfl:  .  fluticasone (FLONASE) 50 MCG/ACT nasal spray, Place 2 sprays into both nostrils daily as needed for allergies or rhinitis., Disp: 16 g, Rfl: 12 .  furosemide (LASIX) 40 MG tablet, Take 1 tablet (40 mg total) by mouth daily., Disp: 30 tablet, Rfl: 11 .  Insulin Aspart (NOVOLOG FLEXPEN [place]), Inject 25 Units into the skin 2 (two) times daily. , Disp: , Rfl:  .  Insulin Degludec (TRESIBA FLEXTOUCH [place]), Inject 150 Units into the skin daily., Disp: , Rfl:  .  ipratropium  (ATROVENT) 0.06 % nasal spray, Place 2 sprays into both nostrils 3 (three) times daily., Disp: 15 mL, Rfl: 12 .  JANUMET 50-500 MG tablet, TK 1 T PO BID, Disp: , Rfl: 1 .  linaclotide (LINZESS) 72 MCG capsule, Take 1 capsule (72 mcg total) by mouth daily before breakfast., Disp: 30 capsule, Rfl: 0 .  loratadine (CLARITIN) 10 MG tablet, Take 1 tablet (10 mg total) by mouth daily as needed for allergies., Disp: 90 tablet, Rfl: 3 .  metoprolol succinate (TOPROL-XL) 25 MG 24 hr tablet, TAKE 1 TABLET(25 MG) BY MOUTH DAILY, Disp: 30 tablet, Rfl: 1 .  mometasone (ELOCON) 0.1 % lotion, APPLY AA BID FOR 2 WEEKS, Disp: , Rfl:  .  Multiple Vitamin (MULTIVITAMIN) tablet, Take 1 tablet by mouth daily., Disp: , Rfl:  .  OZEMPIC 1 MG/DOSE SOPN, INJECT 1 MG SUBCUNTANEOUSLY ONCE A WEEK, Disp: , Rfl: 0 .  potassium chloride (K-DUR) 10 MEQ tablet, Take 1 tablet (10 mEq total) by mouth daily as needed (Daily with Lasix (Furosemide))., Disp: 30 tablet, Rfl: 11 .  rosuvastatin (CRESTOR) 20 MG tablet, Take 1 tablet (20 mg total) by mouth daily., Disp: 30 tablet, Rfl: 11 .  sodium chloride (OCEAN) 0.65 % SOLN nasal spray, Place 2 sprays into both nostrils daily as needed for congestion. 1st before flonase and atrovent, Disp: 1 Bottle, Rfl: 12 .  telmisartan-hydrochlorothiazide (MICARDIS HCT) 80-12.5 MG tablet, Take 1 tablet by mouth daily., Disp: 30 tablet, Rfl: 11  EXAM:  VITALS per patient if applicable:  GENERAL: alert, oriented, appears well and in no acute distress  PSYCH/NEURO: pleasant and cooperative, no obvious depression or anxiety, speech and thought processing grossly intact  ASSESSMENT AND PLAN:  Discussed the following assessment and plan:  Irritable bowel syndrome with constipation - Plan: linaclotide (LINZESS) 72 MCG capsule, mailed info on ibs   Essential hypertension - Plan: sch fasting labs cont meds no refills needed at this time per pt   Hyperlipidemia, unspecified hyperlipidemia type -  Plan: Lipid panel, cont meds   Severe obesity (BMI >= 40) ([place]) - Plan: rec healthy diet choices and exercise   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 Kristine RoyalFeingold -sign release at f/u     I discussed the assessment and treatment plan with the patient. The patient was provided an opportunity to ask questions and all were answered. The patient agreed with the plan and demonstrated an understanding of the instructions.   The patient was advised to call back or seek an in-person evaluation if the symptoms worsen or if the condition fails to improve as anticipated.  Time spent 25 minutes  Bevelyn Bucklesracy N McLean-Scocuzza, MD

## 2019-05-24 ENCOUNTER — Ambulatory Visit (INDEPENDENT_AMBULATORY_CARE_PROVIDER_SITE_OTHER): Payer: Commercial Managed Care - PPO

## 2019-05-24 ENCOUNTER — Other Ambulatory Visit: Payer: Self-pay

## 2019-05-24 ENCOUNTER — Other Ambulatory Visit (INDEPENDENT_AMBULATORY_CARE_PROVIDER_SITE_OTHER): Payer: Commercial Managed Care - PPO

## 2019-05-24 ENCOUNTER — Telehealth: Payer: Self-pay | Admitting: Internal Medicine

## 2019-05-24 VITALS — BP 150/78 | HR 79

## 2019-05-24 DIAGNOSIS — E785 Hyperlipidemia, unspecified: Secondary | ICD-10-CM

## 2019-05-24 DIAGNOSIS — E559 Vitamin D deficiency, unspecified: Secondary | ICD-10-CM

## 2019-05-24 DIAGNOSIS — I10 Essential (primary) hypertension: Secondary | ICD-10-CM

## 2019-05-24 DIAGNOSIS — Z1329 Encounter for screening for other suspected endocrine disorder: Secondary | ICD-10-CM

## 2019-05-24 DIAGNOSIS — E1165 Type 2 diabetes mellitus with hyperglycemia: Secondary | ICD-10-CM

## 2019-05-24 LAB — COMPREHENSIVE METABOLIC PANEL
ALT: 19 U/L (ref 0–35)
AST: 19 U/L (ref 0–37)
Albumin: 3.5 g/dL (ref 3.5–5.2)
Alkaline Phosphatase: 38 U/L — ABNORMAL LOW (ref 39–117)
BUN: 30 mg/dL — ABNORMAL HIGH (ref 6–23)
CO2: 25 mEq/L (ref 19–32)
Calcium: 9.6 mg/dL (ref 8.4–10.5)
Chloride: 105 mEq/L (ref 96–112)
Creatinine, Ser: 1.09 mg/dL (ref 0.40–1.20)
GFR: 50.37 mL/min — ABNORMAL LOW (ref >60.00)
Glucose, Bld: 104 mg/dL — ABNORMAL HIGH (ref 70–99)
Potassium: 4.7 mEq/L (ref 3.5–5.1)
Sodium: 139 mEq/L (ref 135–145)
Total Bilirubin: 0.4 mg/dL (ref 0.2–1.2)
Total Protein: 6.8 g/dL (ref 6.0–8.3)

## 2019-05-24 LAB — CBC WITH DIFFERENTIAL/PLATELET
Basophils Absolute: 0.1 10*3/uL (ref 0.0–0.1)
Basophils Relative: 0.7 % (ref 0.0–3.0)
Eosinophils Absolute: 0.5 10*3/uL (ref 0.0–0.7)
Eosinophils Relative: 6.3 % — ABNORMAL HIGH (ref 0.0–5.0)
HCT: 39.5 % (ref 36.0–46.0)
Hemoglobin: 13 g/dL (ref 12.0–15.0)
Lymphocytes Relative: 12.5 % (ref 12.0–46.0)
Lymphs Abs: 0.9 10*3/uL (ref 0.7–4.0)
MCHC: 32.9 g/dL (ref 30.0–36.0)
MCV: 84.1 fl (ref 78.0–100.0)
Monocytes Absolute: 0.4 10*3/uL (ref 0.1–1.0)
Monocytes Relative: 5.5 % (ref 3.0–12.0)
Neutro Abs: 5.4 10*3/uL (ref 1.4–7.7)
Neutrophils Relative %: 75 % (ref 43.0–77.0)
Platelets: 187 10*3/uL (ref 150.0–400.0)
RBC: 4.7 Mil/uL (ref 3.87–5.11)
RDW: 16.1 % — ABNORMAL HIGH (ref 11.5–15.5)
WBC: 7.2 10*3/uL (ref 4.0–10.5)

## 2019-05-24 LAB — LIPID PANEL
Cholesterol: 188 mg/dL (ref 0–200)
HDL: 28.5 mg/dL — ABNORMAL LOW (ref >39.00)
LDL Cholesterol: 127 mg/dL — ABNORMAL HIGH (ref 0–99)
NonHDL: 159.43
Total CHOL/HDL Ratio: 7
Triglycerides: 160 mg/dL — ABNORMAL HIGH (ref 0.0–149.0)
VLDL: 32 mg/dL (ref 0.0–40.0)

## 2019-05-24 LAB — VITAMIN D 25 HYDROXY (VIT D DEFICIENCY, FRACTURES): VITD: 28.76 ng/mL — ABNORMAL LOW (ref 30.00–100.00)

## 2019-05-24 LAB — HEMOGLOBIN A1C: Hgb A1c MFr Bld: 10.4 % — ABNORMAL HIGH (ref 4.6–6.5)

## 2019-05-24 LAB — TSH: TSH: 1.8 u[IU]/mL (ref 0.35–4.50)

## 2019-05-24 NOTE — Telephone Encounter (Signed)
Called and spoke to patient.  Patient said that she would like to check with her cardiologist first about the suggestions to change meds first and will call PCP back.  Patient feels that her blood pressure may be elevated due to some recent stress and anxiety that she's been feeling.  Pt said that she did not get a chance to schedule the nurse visit for a bp check but will call office back either tomorrow or on Monday to schedule.

## 2019-05-24 NOTE — Telephone Encounter (Signed)
To better control blood pressure   We can increase micardis 80-12.5 to 80-25  AND We can add Norvasc 5 mg to better control her blood pressure  AND We could increase Toprol xl to 50 mg as well to control blood pressure   Which option would she like  I also would like her to keep her blood pressure recheck in the future if we change her medications?   II would consider adding norvasc  5 mg daily   Let me know

## 2019-05-25 ENCOUNTER — Other Ambulatory Visit: Payer: Self-pay | Admitting: Internal Medicine

## 2019-05-25 ENCOUNTER — Telehealth: Payer: Self-pay | Admitting: Internal Medicine

## 2019-05-25 DIAGNOSIS — R319 Hematuria, unspecified: Secondary | ICD-10-CM

## 2019-05-25 LAB — MICROALBUMIN / CREATININE URINE RATIO
Creatinine, Urine: 95 mg/dL (ref 20–275)
Microalb Creat Ratio: 114 mcg/mg creat — ABNORMAL HIGH (ref <30)
Microalb, Ur: 10.8 mg/dL

## 2019-05-25 LAB — URINALYSIS, ROUTINE W REFLEX MICROSCOPIC
Bacteria, UA: NONE SEEN /HPF
Bilirubin Urine: NEGATIVE
Glucose, UA: NEGATIVE
Hyaline Cast: NONE SEEN /LPF
Ketones, ur: NEGATIVE
Nitrite: NEGATIVE
Specific Gravity, Urine: 1.017 (ref 1.001–1.03)
WBC, UA: >=60 /HPF — AB (ref 0–5)
pH: 5.5 (ref 5.0–8.0)

## 2019-05-25 NOTE — Telephone Encounter (Signed)
Schedule blood pressure check  Pt also needs to do a urine culture   Thanks [place]

## 2019-05-25 NOTE — Telephone Encounter (Signed)
-----   Message from Leeanne Rio, [place] sent at 05/25/2019  8:49 AM EDT ----- Urine culture can not be added because the cup was sent to [facility]. That is one of the issues with sending them to [facility] instead of our lab. [facility] will not draw it up for my but our lab will.  ----- Message ----- From: McLean-Scocuzza, Nino Glow, MD Sent: 05/25/2019   8:17 AM EDT To: Leeanne Rio, CMA  Can we add on urine culture?   [place]

## 2019-05-28 ENCOUNTER — Telehealth: Payer: Self-pay | Admitting: Internal Medicine

## 2019-05-28 NOTE — Telephone Encounter (Signed)
To better control blood pressure   We can increase micardis 80-12.5 to 80-25 but she would have to make sure she is staying hydrated with water  AND We can add Norvasc 5 mg to better control her blood pressure  AND We could increase Toprol xl to 50 mg as well to control blood pressure   Which option would she like  I also would like her to keep her blood pressure recheck in the future if we change her medications?   I would consider adding norvasc  5 mg daily   What would she like to do?    Let me know

## 2019-05-28 NOTE — Progress Notes (Signed)
See note re blood pressure I want to add norvasc 5   TMS

## 2019-05-28 NOTE — Progress Notes (Signed)
Patient presented for nurse visit BP check per MD verbal order.  Patient reports compliance with prescribed BP medications: Yes  Last dose of BP medication: last night  BP readings from nurse visit given verbally to PCP.  BP Readings from Last 3 Encounters:  05/24/19 (!) 152/78  01/27/18 130/78  01/19/18 (!) 176/80   Pulse Readings from Last 3 Encounters:  05/24/19 79  01/27/18 98  01/19/18 (!) 113

## 2019-05-29 NOTE — Telephone Encounter (Signed)
Patient is going back to [place] she will call and schedule.

## 2019-05-30 ENCOUNTER — Other Ambulatory Visit: Payer: Self-pay | Admitting: Internal Medicine

## 2019-05-30 ENCOUNTER — Telehealth: Payer: Self-pay

## 2019-05-30 DIAGNOSIS — I10 Essential (primary) hypertension: Secondary | ICD-10-CM

## 2019-05-30 MED ORDER — METOPROLOL SUCCINATE ER 50 MG PO TB24: 50.0000 mg | ORAL_TABLET | Freq: Every day | ORAL | 3 refills | Status: DC

## 2019-05-30 MED ORDER — AMLODIPINE BESYLATE 5 MG PO TABS: 5.0000 mg | ORAL_TABLET | Freq: Every day | ORAL | 3 refills | Status: DC

## 2019-05-30 NOTE — Telephone Encounter (Signed)
She will go back to [place] in 1-2 weeks and not back to [place] until 10/2019  She will meet with Endocrine in [place] advised to mention they manage DM A1c 10.4    [place]

## 2019-05-30 NOTE — Telephone Encounter (Signed)
Copied from [place] [id number]. Topic: General - Other >> May 30, 2019 11:54 AM Mcneil, Ja-Kwan wrote: Reason for CRM: Pt stated she spoke with her Cardiologist in [place] and he will be adding Norvasc and increasing the Metoprolol to 50 MG. Pt also asked that Dr. Olivia Mackie be informed that she will be returning back to [place].

## 2020-06-11 ENCOUNTER — Telehealth: Payer: Self-pay | Admitting: Internal Medicine

## 2020-06-11 NOTE — Telephone Encounter (Signed)
Spoke to pt. 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 Once she is out and feeling better she will return to [facility]  Pt due for an OV with PCP and AWV-Initial with Denisa

## 2020-10-13 ENCOUNTER — Telehealth: Payer: Self-pay | Admitting: Cardiovascular Disease

## 2020-10-13 NOTE — Telephone Encounter (Signed)
Patient moved to snf in [place] and will not be able to do a fu visit at this time  Deleting recall.

## 2021-04-16 ENCOUNTER — Telehealth: Payer: Self-pay | Admitting: Internal Medicine

## 2021-04-16 NOTE — Telephone Encounter (Signed)
Left message for patient to call back and schedule Medicare Annual Wellness Visit (AWV) in office.   If not able to come in office, please offer to do virtually or by telephone.   Due for AWVI  Please schedule at anytime with Nurse Health Advisor.   

## 2023-04-29 ENCOUNTER — Inpatient Hospital Stay: Payer: Medicare Other

## 2023-04-29 ENCOUNTER — Encounter: Payer: Self-pay | Admitting: Intensive Care

## 2023-04-29 ENCOUNTER — Other Ambulatory Visit: Payer: Self-pay

## 2023-04-29 ENCOUNTER — Emergency Department: Payer: Medicare Other

## 2023-04-29 ENCOUNTER — Inpatient Hospital Stay
Admission: EM | Admit: 2023-04-29 | Discharge: 2023-05-05 | DRG: 280 | Disposition: A | Discharge status: Skilled Nursing Facility | Payer: Medicare Other | Source: Skilled Nursing Facility | Attending: Internal Medicine | Admitting: Internal Medicine

## 2023-04-29 DIAGNOSIS — I25118 Atherosclerotic heart disease of native coronary artery with other forms of angina pectoris: Secondary | ICD-10-CM

## 2023-04-29 DIAGNOSIS — J9622 Acute and chronic respiratory failure with hypercapnia: Secondary | ICD-10-CM | POA: Diagnosis present

## 2023-04-29 DIAGNOSIS — E785 Hyperlipidemia, unspecified: Secondary | ICD-10-CM | POA: Diagnosis present

## 2023-04-29 DIAGNOSIS — G4733 Obstructive sleep apnea (adult) (pediatric): Secondary | ICD-10-CM | POA: Diagnosis not present

## 2023-04-29 DIAGNOSIS — J9601 Acute respiratory failure with hypoxia: Secondary | ICD-10-CM | POA: Diagnosis present

## 2023-04-29 DIAGNOSIS — Z87891 Personal history of nicotine dependence: Secondary | ICD-10-CM | POA: Diagnosis not present

## 2023-04-29 DIAGNOSIS — Z91199 Patient's noncompliance with other medical treatment and regimen due to unspecified reason: Secondary | ICD-10-CM

## 2023-04-29 DIAGNOSIS — M3313 Other dermatomyositis without myopathy: Secondary | ICD-10-CM | POA: Diagnosis present

## 2023-04-29 DIAGNOSIS — J9621 Acute and chronic respiratory failure with hypoxia: Secondary | ICD-10-CM | POA: Diagnosis present

## 2023-04-29 DIAGNOSIS — Z7984 Long term (current) use of oral hypoglycemic drugs: Secondary | ICD-10-CM | POA: Diagnosis not present

## 2023-04-29 DIAGNOSIS — I350 Nonrheumatic aortic (valve) stenosis: Secondary | ICD-10-CM | POA: Diagnosis present

## 2023-04-29 DIAGNOSIS — R7989 Other specified abnormal findings of blood chemistry: Secondary | ICD-10-CM | POA: Diagnosis present

## 2023-04-29 DIAGNOSIS — M3310 Other dermatopolymyositis, organ involvement unspecified: Secondary | ICD-10-CM | POA: Diagnosis not present

## 2023-04-29 DIAGNOSIS — G9341 Metabolic encephalopathy: Secondary | ICD-10-CM | POA: Diagnosis present

## 2023-04-29 DIAGNOSIS — Z993 Dependence on wheelchair: Secondary | ICD-10-CM | POA: Diagnosis not present

## 2023-04-29 DIAGNOSIS — Z6841 Body Mass Index (BMI) 40.0 and over, adult: Secondary | ICD-10-CM | POA: Diagnosis not present

## 2023-04-29 DIAGNOSIS — E662 Morbid (severe) obesity with alveolar hypoventilation: Secondary | ICD-10-CM | POA: Diagnosis present

## 2023-04-29 DIAGNOSIS — J9602 Acute respiratory failure with hypercapnia: Secondary | ICD-10-CM | POA: Diagnosis not present

## 2023-04-29 DIAGNOSIS — I2510 Atherosclerotic heart disease of native coronary artery without angina pectoris: Secondary | ICD-10-CM | POA: Diagnosis present

## 2023-04-29 DIAGNOSIS — Z794 Long term (current) use of insulin: Secondary | ICD-10-CM

## 2023-04-29 DIAGNOSIS — Z8672 Personal history of thrombophlebitis: Secondary | ICD-10-CM

## 2023-04-29 DIAGNOSIS — R0609 Other forms of dyspnea: Secondary | ICD-10-CM | POA: Diagnosis not present

## 2023-04-29 DIAGNOSIS — Z8249 Family history of ischemic heart disease and other diseases of the circulatory system: Secondary | ICD-10-CM

## 2023-04-29 DIAGNOSIS — Z86718 Personal history of other venous thrombosis and embolism: Secondary | ICD-10-CM

## 2023-04-29 DIAGNOSIS — I10 Essential (primary) hypertension: Secondary | ICD-10-CM | POA: Diagnosis present

## 2023-04-29 DIAGNOSIS — D649 Anemia, unspecified: Secondary | ICD-10-CM | POA: Diagnosis present

## 2023-04-29 DIAGNOSIS — Z7901 Long term (current) use of anticoagulants: Secondary | ICD-10-CM

## 2023-04-29 DIAGNOSIS — I5031 Acute diastolic (congestive) heart failure: Secondary | ICD-10-CM

## 2023-04-29 DIAGNOSIS — Z86711 Personal history of pulmonary embolism: Secondary | ICD-10-CM

## 2023-04-29 DIAGNOSIS — E1159 Type 2 diabetes mellitus with other circulatory complications: Secondary | ICD-10-CM | POA: Diagnosis present

## 2023-04-29 DIAGNOSIS — Z90710 Acquired absence of both cervix and uterus: Secondary | ICD-10-CM

## 2023-04-29 DIAGNOSIS — I5033 Acute on chronic diastolic (congestive) heart failure: Secondary | ICD-10-CM | POA: Diagnosis present

## 2023-04-29 DIAGNOSIS — I2699 Other pulmonary embolism without acute cor pulmonale: Secondary | ICD-10-CM | POA: Diagnosis present

## 2023-04-29 DIAGNOSIS — Z7985 Long-term (current) use of injectable non-insulin antidiabetic drugs: Secondary | ICD-10-CM

## 2023-04-29 DIAGNOSIS — I21A1 Myocardial infarction type 2: Secondary | ICD-10-CM | POA: Diagnosis present

## 2023-04-29 DIAGNOSIS — Z882 Allergy status to sulfonamides status: Secondary | ICD-10-CM

## 2023-04-29 DIAGNOSIS — E1165 Type 2 diabetes mellitus with hyperglycemia: Secondary | ICD-10-CM | POA: Diagnosis present

## 2023-04-29 DIAGNOSIS — E782 Mixed hyperlipidemia: Secondary | ICD-10-CM | POA: Diagnosis not present

## 2023-04-29 DIAGNOSIS — Z79899 Other long term (current) drug therapy: Secondary | ICD-10-CM | POA: Diagnosis not present

## 2023-04-29 DIAGNOSIS — R4182 Altered mental status, unspecified: Secondary | ICD-10-CM | POA: Insufficient documentation

## 2023-04-29 DIAGNOSIS — Z1152 Encounter for screening for COVID-19: Secondary | ICD-10-CM

## 2023-04-29 DIAGNOSIS — I214 Non-ST elevation (NSTEMI) myocardial infarction: Secondary | ICD-10-CM | POA: Diagnosis not present

## 2023-04-29 DIAGNOSIS — Z7401 Bed confinement status: Secondary | ICD-10-CM

## 2023-04-29 DIAGNOSIS — I110 Hypertensive heart disease with heart failure: Secondary | ICD-10-CM | POA: Diagnosis present

## 2023-04-29 LAB — CBC WITH DIFFERENTIAL/PLATELET
Abs Immature Granulocytes: 0.09 10*3/uL — ABNORMAL HIGH (ref 0.00–0.07)
Basophils Absolute: 0.1 10*3/uL (ref 0.0–0.1)
Basophils Relative: 1 %
Eosinophils Absolute: 0.1 10*3/uL (ref 0.0–0.5)
Eosinophils Relative: 1 %
HCT: 37.2 % (ref 36.0–46.0)
Hemoglobin: 10.9 g/dL — ABNORMAL LOW (ref 12.0–15.0)
Immature Granulocytes: 1 %
Lymphocytes Relative: 3 %
Lymphs Abs: 0.3 10*3/uL — ABNORMAL LOW (ref 0.7–4.0)
MCH: 29 pg (ref 26.0–34.0)
MCHC: 29.3 g/dL — ABNORMAL LOW (ref 30.0–36.0)
MCV: 98.9 fL (ref 80.0–100.0)
Monocytes Absolute: 0.6 10*3/uL (ref 0.1–1.0)
Monocytes Relative: 5 %
Neutro Abs: 10.3 10*3/uL — ABNORMAL HIGH (ref 1.7–7.7)
Neutrophils Relative %: 89 %
Platelets: 200 10*3/uL (ref 150–400)
RBC: 3.76 MIL/uL — ABNORMAL LOW (ref 3.87–5.11)
RDW: 18.1 % — ABNORMAL HIGH (ref 11.5–15.5)
WBC: 11.4 10*3/uL — ABNORMAL HIGH (ref 4.0–10.5)
nRBC: 0 % (ref 0.0–0.2)

## 2023-04-29 LAB — BLOOD GAS, VENOUS
Acid-Base Excess: 6.6 mmol/L — ABNORMAL HIGH (ref 0.0–2.0)
Acid-Base Excess: 8.3 mmol/L — ABNORMAL HIGH (ref 0.0–2.0)
Bicarbonate: 37.3 mmol/L — ABNORMAL HIGH (ref 20.0–28.0)
Bicarbonate: 36.9 mmol/L — ABNORMAL HIGH (ref 20.0–28.0)
Delivery systems: POSITIVE
Delivery systems: POSITIVE
FIO2: 50 %
O2 Saturation: 45.9 %
O2 Saturation: 72.1 %
Patient temperature: 37
Patient temperature: 37
pCO2, Ven: 87 mmHg (ref 44–60)
pCO2, Ven: 70 mmHg — ABNORMAL HIGH (ref 44–60)
pH, Ven: 7.24 — ABNORMAL LOW (ref 7.25–7.43)
pH, Ven: 7.33 (ref 7.25–7.43)
pO2, Ven: <31 mmHg — CL (ref 32–45)
pO2, Ven: 40 mmHg (ref 32–45)

## 2023-04-29 LAB — COMPREHENSIVE METABOLIC PANEL
ALT: 8 U/L (ref 0–44)
AST: 15 U/L (ref 15–41)
Albumin: 3.4 g/dL — ABNORMAL LOW (ref 3.5–5.0)
Alkaline Phosphatase: 56 U/L (ref 38–126)
Anion gap: 17 — ABNORMAL HIGH (ref 5–15)
BUN: 49 mg/dL — ABNORMAL HIGH (ref 8–23)
CO2: 28 mmol/L (ref 22–32)
Calcium: 9.1 mg/dL (ref 8.9–10.3)
Chloride: 98 mmol/L (ref 98–111)
Creatinine, Ser: 1.04 mg/dL — ABNORMAL HIGH (ref 0.44–1.00)
GFR, Estimated: 58 mL/min — ABNORMAL LOW (ref >60)
Glucose, Bld: 338 mg/dL — ABNORMAL HIGH (ref 70–99)
Potassium: 4.5 mmol/L (ref 3.5–5.1)
Sodium: 143 mmol/L (ref 135–145)
Total Bilirubin: 0.8 mg/dL (ref 0.3–1.2)
Total Protein: 7.2 g/dL (ref 6.5–8.1)

## 2023-04-29 LAB — BRAIN NATRIURETIC PEPTIDE: B Natriuretic Peptide: 386.8 pg/mL — ABNORMAL HIGH (ref 0.0–100.0)

## 2023-04-29 LAB — SARS CORONAVIRUS 2 BY RT PCR: SARS Coronavirus 2 by RT PCR: NEGATIVE

## 2023-04-29 LAB — TROPONIN I (HIGH SENSITIVITY)
Troponin I (High Sensitivity): 40 ng/L — ABNORMAL HIGH (ref <18)
Troponin I (High Sensitivity): 195 ng/L (ref <18)
Troponin I (High Sensitivity): 1150 ng/L (ref <18)
Troponin I (High Sensitivity): 1085 ng/L (ref <18)

## 2023-04-29 LAB — HEPARIN LEVEL (UNFRACTIONATED): Heparin Unfractionated: >1.1 IU/mL — ABNORMAL HIGH (ref 0.30–0.70)

## 2023-04-29 LAB — APTT: aPTT: 30 seconds (ref 24–36)

## 2023-04-29 LAB — CBG MONITORING, ED: Glucose-Capillary: 217 mg/dL — ABNORMAL HIGH (ref 70–99)

## 2023-04-29 LAB — PROCALCITONIN: Procalcitonin: 0.18 ng/mL

## 2023-04-29 MED ORDER — HEPARIN (PORCINE) 25000 UT/250ML-% IV SOLN
1100.0000 [IU]/h | INTRAVENOUS | Status: DC
  Administered 2023-04-29: 1100 [IU]/h via INTRAVENOUS
  Filled 2023-04-29: qty 250

## 2023-04-29 MED ORDER — SENNOSIDES-DOCUSATE SODIUM 8.6-50 MG PO TABS: 1.0000 | ORAL_TABLET | Freq: Every evening | ORAL | Status: DC | PRN

## 2023-04-29 MED ORDER — IPRATROPIUM-ALBUTEROL 0.5-2.5 (3) MG/3ML IN SOLN
3.0000 mL | Freq: Once | RESPIRATORY_TRACT | Status: AC
  Administered 2023-04-29: 3 mL via RESPIRATORY_TRACT
  Filled 2023-04-29: qty 3

## 2023-04-29 MED ORDER — INSULIN ASPART 100 UNIT/ML IJ SOLN
10.0000 [IU] | Freq: Once | INTRAMUSCULAR | Status: AC
  Administered 2023-04-29: 10 [IU] via SUBCUTANEOUS
  Filled 2023-04-29: qty 1

## 2023-04-29 MED ORDER — ACETAMINOPHEN 325 MG PO TABS: 650.0000 mg | ORAL_TABLET | Freq: Four times a day (QID) | ORAL | Status: AC | PRN

## 2023-04-29 MED ORDER — ENOXAPARIN SODIUM 40 MG/0.4ML IJ SOSY
40.0000 mg | PREFILLED_SYRINGE | INTRAMUSCULAR | Status: DC
Start: 2023-04-29 — End: 2023-04-29

## 2023-04-29 MED ORDER — ENOXAPARIN SODIUM 80 MG/0.8ML IJ SOSY
65.0000 mg | PREFILLED_SYRINGE | INTRAMUSCULAR | Status: DC
  Filled 2023-04-29: qty 0.65

## 2023-04-29 MED ORDER — METHYLPREDNISOLONE SODIUM SUCC 125 MG IJ SOLR
125.0000 mg | Freq: Once | INTRAMUSCULAR | Status: AC
  Administered 2023-04-29: 125 mg via INTRAVENOUS
  Filled 2023-04-29: qty 2

## 2023-04-29 MED ORDER — FUROSEMIDE 10 MG/ML IJ SOLN
60.0000 mg | Freq: Once | INTRAMUSCULAR | Status: AC
  Administered 2023-04-29: 60 mg via INTRAVENOUS
  Filled 2023-04-29: qty 8

## 2023-04-29 MED ORDER — INSULIN ASPART 100 UNIT/ML IJ SOLN
0.0000 [IU] | Freq: Every day | INTRAMUSCULAR | Status: DC
  Administered 2023-04-29: 2 [IU] via SUBCUTANEOUS
  Administered 2023-04-30: 3 [IU] via SUBCUTANEOUS
  Administered 2023-05-01: 5 [IU] via SUBCUTANEOUS
  Administered 2023-05-02: 3 [IU] via SUBCUTANEOUS
  Administered 2023-05-03 – 2023-05-04 (×2): 2 [IU] via SUBCUTANEOUS
  Filled 2023-04-29 (×6): qty 1

## 2023-04-29 MED ORDER — INSULIN ASPART 100 UNIT/ML IJ SOLN
0.0000 [IU] | Freq: Three times a day (TID) | INTRAMUSCULAR | Status: DC
  Administered 2023-04-30: 11 [IU] via SUBCUTANEOUS
  Administered 2023-04-30: 4 [IU] via SUBCUTANEOUS
  Administered 2023-04-30: 7 [IU] via SUBCUTANEOUS
  Administered 2023-05-01 (×3): 11 [IU] via SUBCUTANEOUS
  Administered 2023-05-02: 7 [IU] via SUBCUTANEOUS
  Administered 2023-05-02 (×2): 11 [IU] via SUBCUTANEOUS
  Administered 2023-05-03: 4 [IU] via SUBCUTANEOUS
  Administered 2023-05-03: 7 [IU] via SUBCUTANEOUS
  Administered 2023-05-03 – 2023-05-04 (×2): 4 [IU] via SUBCUTANEOUS
  Administered 2023-05-04: 7 [IU] via SUBCUTANEOUS
  Administered 2023-05-05: 4 [IU] via SUBCUTANEOUS
  Administered 2023-05-05: 7 [IU] via SUBCUTANEOUS
  Filled 2023-04-29 (×16): qty 1

## 2023-04-29 MED ORDER — HEPARIN (PORCINE) 25000 UT/250ML-% IV SOLN
1550.0000 [IU]/h | INTRAVENOUS | Status: DC
  Administered 2023-05-01: 1550 [IU]/h via INTRAVENOUS
  Filled 2023-04-29 (×2): qty 250

## 2023-04-29 MED ORDER — ACETAMINOPHEN 650 MG RE SUPP: 650.0000 mg | Freq: Four times a day (QID) | RECTAL | Status: AC | PRN

## 2023-04-29 MED ORDER — ONDANSETRON HCL 4 MG PO TABS: 4.0000 mg | ORAL_TABLET | Freq: Four times a day (QID) | ORAL | Status: AC | PRN

## 2023-04-29 MED ORDER — ONDANSETRON HCL 4 MG/2ML IJ SOLN: 4.0000 mg | Freq: Four times a day (QID) | INTRAMUSCULAR | Status: AC | PRN

## 2023-04-29 NOTE — Progress Notes (Signed)
PHARMACIST - PHYSICIAN COMMUNICATION  CONCERNING:  Enoxaparin (Lovenox) for DVT Prophylaxis    RECOMMENDATION: Patient was prescribed enoxaprin 40mg  q24 hours for VTE prophylaxis.   Filed Weights   04/29/23 1503  Weight: 133 kg (293 lb 3.4 oz)    Body mass index is 53.63 kg/m.  Estimated Creatinine Clearance: 67.1 mL/min (A) (by C-G formula based on SCr of 1.04 mg/dL (H)).   Based on [facility] policy patient is candidate for enoxaparin 0.5mg /kg TBW SQ every 24 hours based on BMI being >30.  DESCRIPTION: Pharmacy has adjusted enoxaparin dose per [facility] policy.  Patient is now receiving enoxaparin 65 mg every 24 hours    Sharen Hones, PharmD Clinical Pharmacist  04/29/2023 6:13 PM

## 2023-04-29 NOTE — Assessment & Plan Note (Addendum)
Or wheelchair-bound Per sister, patient has not walked for 3 years At baseline currently, patient participates with physical therapy doing upper extremity and pulling herself up to standing position

## 2023-04-29 NOTE — Assessment & Plan Note (Signed)
Records from [place] have not uploaded to Care Everywhere

## 2023-04-29 NOTE — Progress Notes (Addendum)
ANTICOAGULATION CONSULT NOTE - Initial Consult  Pharmacy Consult for Heparin drip Indication: chest pain/ACS, on apixaban PTA for hx DVT/PE  Allergies  Allergen Reactions   Sulfa Antibiotics Other (See Comments)    Reaction: isn't certain, thinks she ran a fever, or had a rash, maybe both.    Patient Measurements: Height: 5\' 2"  (157.5 cm) Weight: 133 kg (293 lb 3.4 oz) IBW/kg (Calculated) : 50.1 Heparin Dosing Weight: 83.7 kg  Vital Signs: Temp: 98.1 F (36.7 C) (05/31 1520) Temp Source: Axillary (05/31 1520) BP: 137/54 (05/31 1733) Pulse Rate: 77 (05/31 1736)  Labs: Recent Labs    04/29/23 1507 04/29/23 1715  HGB 10.9*  --   HCT 37.2  --   PLT 200  --   CREATININE 1.04*  --   TROPONINIHS 40* 195*    Estimated Creatinine Clearance: 67.1 mL/min (A) (by C-G formula based on SCr of 1.04 mg/dL (H)).   Medical History: Past Medical History:  Diagnosis Date   Calculus of kidney    Diabetes mellitus without complication (HCC)    DVT (deep venous thrombosis) (HCC)    a. on Eliquis   Family history of early CAD    a. parents passing in their 32's from CAD   Gout, unspecified    Heart murmur    Hyperlipidemia    Hypertension    Morbid obesity (HCC)    Normal cardiac stress test    a. equivocal study, sig soft tissue artifact present, no chest discomfort or ECG changes, perfusion images suggest mod sized region of mild reversible perfusion defect. Findings may be 2/2 shifting soft tissue attenuation, but cannot r/o ischemia, EF 72%   Phlebitis and thrombophlebitis of other deep vessels of lower extremities    Phlebitis and thrombophlebitis of other deep vessels of lower extremities    Pulmonary emboli (HCC)    a. on Eliquis   Spinal stenosis, unspecified region other than cervical    Tobacco abuse    Type II or unspecified type diabetes mellitus without mention of complication, uncontrolled    Unspecified sleep apnea    Urinary tract infection, site not specified      Medications:  (Not in a hospital admission)  Scheduled:   [START ON 04/30/2023] insulin aspart  0-20 Units Subcutaneous TID WC   insulin aspart  0-5 Units Subcutaneous QHS   Infusions:   Assessment: 67 yo F to start heparin drip for ACS/STEMI.  Patient on apixaban PTA for hx DVT/PE. Patient from nursing facility, bedbound.  Currently with AMS.  Last apixaban given 0900 5/31 Baseline:  hgb 10.9  plt 200   aPTT 30  Heparin level >1.10  (on apixaban)   Goal of Therapy:  Heparin level 0.3-0.7 units/ml aPTT 66-102 seconds Monitor platelets by anticoagulation protocol: Yes   Plan:  Patient on apixaban PTA therefore no bolus at this time. Will start Heparin drip at 1100 units/hr Will check aPTT in 6 hours Will check Heparin level at lest once daily and when HL and aPTT are correlating will switch to following HL. CBC daily  Logan Vegh A 04/29/2023,7:05 PM

## 2023-04-29 NOTE — Assessment & Plan Note (Signed)
-   This complicates overall care and prognosis.  

## 2023-04-29 NOTE — ED Provider Notes (Signed)
[facility] Provider Note    Event Date/Time   First MD Initiated Contact with Patient 04/29/23 1503     (approximate)   History   Respiratory Distress  Level V Caveat:  respiratory distress  HPI  Sonya Sanchez is a 67 y.o. female with an extensive history including OSA, CHF presents to the ER in acute respiratory distress.  Patient found to be 60% on 2 L nasal cannula at facility.  Was placed on CPAP given 2 nebulizers and line.  Feels like her breathing is improving.  Denies any pain no reported fevers.  No vomiting.     Physical Exam   Triage Vital Signs: ED Triage Vitals  Enc Vitals Group     BP --      Pulse --      Resp --      Temp --      Temp src --      SpO2 --      Weight 04/29/23 1503 293 lb 3.4 oz (133 kg)     Height --      Head Circumference --      Peak Flow --      Pain Score 04/29/23 1502 0     Pain Loc --      Pain Edu? --      Excl. in GC? --     Most recent vital signs: Vitals:   04/29/23 1600 04/29/23 1630  BP: (!) 153/50 104/81  Pulse: 93 86  Resp: 14 11  Temp:    SpO2: 94% 98%     Constitutional: acute resp distress Eyes: Conjunctivae are normal.  Head: Atraumatic. Nose: No congestion/rhinnorhea. Mouth/Throat: Mucous membranes are moist.   Neck: Painless ROM.  Cardiovascular:   Good peripheral circulation. Respiratory: Normal respiratory effort.  No retractions.  Gastrointestinal: Soft and nontender.  Musculoskeletal:  no deformity, 3+ BLE edema Neurologic:  MAE spontaneously. No gross focal neurologic deficits are appreciated.  Skin:  Skin is warm, dry and intact. No rash noted. Psychiatric: Mood and affect are normal. Speech and behavior are normal.    ED Results / Procedures / Treatments   Labs (all labs ordered are listed, but only abnormal results are displayed) Labs Reviewed  CBC WITH DIFFERENTIAL/PLATELET - Abnormal; Notable for the following components:      Result Value   WBC 11.4 (*)     RBC 3.76 (*)    Hemoglobin 10.9 (*)    MCHC 29.3 (*)    RDW 18.1 (*)    Neutro Abs 10.3 (*)    Lymphs Abs 0.3 (*)    Abs Immature Granulocytes 0.09 (*)    All other components within normal limits  COMPREHENSIVE METABOLIC PANEL - Abnormal; Notable for the following components:   Glucose, Bld 338 (*)    BUN 49 (*)    Creatinine, Ser 1.04 (*)    Albumin 3.4 (*)    GFR, Estimated 58 (*)    Anion gap 17 (*)    All other components within normal limits  BRAIN NATRIURETIC PEPTIDE - Abnormal; Notable for the following components:   B Natriuretic Peptide 386.8 (*)    All other components within normal limits  BLOOD GAS, VENOUS - Abnormal; Notable for the following components:   pH, Ven 7.24 (*)    pCO2, Ven 87 (*)    pO2, Ven <31 (*)    Bicarbonate 37.3 (*)    Acid-Base Excess 6.6 (*)    All other  components within normal limits  BLOOD GAS, VENOUS - Abnormal; Notable for the following components:   pCO2, Ven 70 (*)    Bicarbonate 36.9 (*)    Acid-Base Excess 8.3 (*)    All other components within normal limits  TROPONIN I (HIGH SENSITIVITY) - Abnormal; Notable for the following components:   Troponin I (High Sensitivity) 40 (*)    All other components within normal limits  SARS CORONAVIRUS 2 BY RT PCR  CULTURE, BLOOD (ROUTINE X 2)  CULTURE, BLOOD (ROUTINE X 2)  PROCALCITONIN  TROPONIN I (HIGH SENSITIVITY)     EKG  ED ECG REPORT I, Willy Eddy, the attending physician, personally viewed and interpreted this ECG.   Date: 04/29/2023  EKG Time: 15:02  Rate: 100  Rhythm: sinus  Axis: normal  Intervals: normal  ST&T Change: no stemi, no depressions    RADIOLOGY Please see ED Course for my review and interpretation.  I personally reviewed all radiographic images ordered to evaluate for the above acute complaints and reviewed radiology reports and findings.  These findings were personally discussed with the patient.  Please see medical record for radiology  report.    PROCEDURES:  Critical Care performed: Yes, see critical care procedure note(s)  .Critical Care  Performed by: Willy Eddy, MD Authorized by: Willy Eddy, MD   Critical care provider statement:    Critical care time (minutes):  40   Critical care was necessary to treat or prevent imminent or life-threatening deterioration of the following conditions:  Respiratory failure   Critical care was time spent personally by me on the following activities:  Ordering and performing treatments and interventions, ordering and review of laboratory studies, ordering and review of radiographic studies, pulse oximetry, re-evaluation of patient's condition, review of old charts, obtaining history from patient or surrogate, examination of patient, evaluation of patient's response to treatment, discussions with primary provider, discussions with consultants and development of treatment plan with patient or surrogate    MEDICATIONS ORDERED IN ED: Medications  furosemide (LASIX) injection 60 mg (60 mg Intravenous Given 04/29/23 1646)  methylPREDNISolone sodium succinate (SOLU-MEDROL) 125 mg/2 mL injection 125 mg (125 mg Intravenous Given 04/29/23 1643)  ipratropium-albuterol (DUONEB) 0.5-2.5 (3) MG/3ML nebulizer solution 3 mL (3 mLs Nebulization Given 04/29/23 1600)  insulin aspart (novoLOG) injection 10 Units (10 Units Subcutaneous Given 04/29/23 1643)     IMPRESSION / MDM / ASSESSMENT AND PLAN / ED COURSE  I reviewed the triage vital signs and the nursing notes.                              Differential diagnosis includes, but is not limited to, Asthma, copd, CHF, pna, ptx, malignancy, Pe, anemia  Patient presenting to the ER for evaluation of symptoms as described above.  Based on symptoms, risk factors and considered above differential, this presenting complaint could reflect a potentially life-threatening illness therefore the patient will be placed on continuous pulse oximetry  and telemetry for monitoring.  Laboratory evaluation will be sent to evaluate for the above complaints.  Patient with acute respiratory distress requiring BiPAP for supplemental oxygen and respiratory support.  O2 sats increasing she is not febrile.  Given nebulizers by EMS but no documented history of COPD.  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    Clinical Course as of 04/29/23 1736  Fri Apr 29, 2023  1519  Chest x-ray my review and interpretation does not show any evidence of pneumothorax but diffuse interstitial findings concerning for ILD versus CHF.  She clinically does appear to be improving on BiPAP. [PR]  1524 Patient reassessed again.  Appears to be clinically improving.  Respiratory rate is coming down.  O2 saturations in the high 90s now weaned down to 40% FiO2.   She is now more communicative and more comfortable appearing.  States that she has been compliant with her Eliquis therefore have a lower suspicion for acute PE. [PR]  1553 VBG is significantly elevated with hypercapnic respiratory failure.  She does appear to have improved mentation since being placed on BiPAP.  Her ventilation is improving on BiPAP.  Will repeat VBG 1 hour from initial to evaluate for improvement and make further determination of need for intubation.  Will give Solu-Medrol though she does not have a history of COPD.  Her chest x-ray does show diffuse edema.  Will give dose of Lasix given her history of CHF and evidence of volume overload. [PR]  1628 On further discussion with patient's sister at bedside she is not been using her CPAP due to a faulty nasal piece.  Also received dose of Ativan at the facility.  Has noted that over the past week she has been becoming increasingly drowsy. [PR]  1652 COVID-negative.  Procalcitonin less than 0.2 [PR]  1735 Repeat VBG with significant improvement and hypercapnic respiratory failure.   Mentation continues to improve.  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  Also has findings of CHF.  Will consult hospitalist for admission. [PR]    Clinical Course User Index [PR] Willy Eddy, MD     FINAL CLINICAL IMPRESSION(S) / ED DIAGNOSES   Final diagnoses:  Acute respiratory failure with hypoxia and hypercapnia (HCC)     Rx / DC Orders   ED Discharge Orders     None        Note:  This document was prepared using Dragon voice recognition software and may include unintentional dictation errors.    Willy Eddy, MD 04/29/23 [id number]

## 2023-04-29 NOTE — Hospital Course (Addendum)
Sonya Sanchez is a 67 year old female with history of insulin-dependent diabetes mellitus, GERD, hypertension, hyperlipidemia, morbid obesity, OSA on CPAP, history of DVT, PE on anticoagulation, chronic respiratory failure on 2 L nasal cannula at baseline, dermatomyositis, baseline bedbound (has not walked for 3 years) prior smoking history, who presents emergency department for chief concerns of respiratory distress.  Patient was brought in from EMS via [facility].  Per report, patient SpO2 was 60% on 2 L nasal cannula.  Vitals in the ED showed temperature of 98.1, respiration rate of 14, heart rate 93, blood pressure 153/50, SpO2 of 94% on BiPAP.  Serum sodium is 143, potassium 4.5, 98, bicarb 28, BUN of 49, serum creatinine 1.04, EGFR 58, nonfasting blood glucose 338, WBC 11.4, hemoglobin 10.9, platelets of 200.  Procalcitonin was 0.18.  But BNP was 386.8.  High sensitive troponin is 40.  COVID PCR was negative.  ED treatment: Furosemide 60 mg IV one-time dose, insulin aspart 10 units, DuoNebs x 2 treatments.

## 2023-04-29 NOTE — Assessment & Plan Note (Signed)
Patient currently on BiPAP continuously Patient would need CPAP nightly once patient transitions off BiPAP continuously

## 2023-04-29 NOTE — Assessment & Plan Note (Addendum)
Insulin-dependent diabetes mellitus Insulin SSI with agents coverage ordered Pending med reconciliation for long-acting insulin

## 2023-04-29 NOTE — ED Triage Notes (Addendum)
Coming from [facility] by EMS with respiratory distress. Wears 2L continuous.   EMS administered 2 duonebs.   EMS vitals: 432CBG 170s syst  Reports 60% on 2L upon arrival to facility

## 2023-04-29 NOTE — Assessment & Plan Note (Signed)
Patient minimally arousable at bedside, she does open her eyes with sternal and verbal stimuli She does not move her lower extremities, which is baseline for patient CT of the head without contrast has been ordered

## 2023-04-29 NOTE — H&P (Signed)
History and Physical   Sonya Sanchez ZOX:[id number] DOB: 05-Feb-1956 DOA: 04/29/2023  PCP: Pcp, No  Patient coming from: [facility]  I have personally briefly reviewed patient's old medical records in [facility] EMR.  Chief Concern: shortness of breath  HPI: Sonya Sanchez is a 67 year old female with history of insulin-dependent diabetes mellitus, GERD, hypertension, hyperlipidemia, morbid obesity, OSA on CPAP, history of DVT, PE on anticoagulation, chronic respiratory failure on 2 L nasal cannula at baseline, dermatomyositis, baseline bedbound (has not walked for 3 years) prior smoking history, who presents emergency department for chief concerns of respiratory distress.  Patient was brought in from EMS via [place] [facility].  Per report, patient SpO2 was 60% on 2 L nasal cannula.  Vitals in the ED showed temperature of 98.1, respiration rate of 14, heart rate 93, blood pressure 153/50, SpO2 of 94% on BiPAP.  Serum sodium is 143, potassium 4.5, 98, bicarb 28, BUN of 49, serum creatinine 1.04, EGFR 58, nonfasting blood glucose 338, WBC 11.4, hemoglobin 10.9, platelets of 200.  Procalcitonin was 0.18.  But BNP was 386.8.  High sensitive troponin is 40.  COVID PCR was negative.  ED treatment: Furosemide 60 mg IV one-time dose, insulin aspart 10 units, DuoNebs x 2 treatments. ------------------------------ At bedside, patient appears weak, lethargic. Patient is on bipap. She opens her eyes with sternal rub and grunts weakly with loud verbal stimuli.  [name] sees patient every other day. Today, at approximately noon, she was at [facility] nursing facility checking in on her sister and noticed that her sister states that she is having difficulties breathing.  She alerted the nursing staff and EMS was called.  [name] reports that her sister greeted her, stating that she was glad that her sister is here.  [name] denies report of fever, vomiting, diarrhea.  Sister reports that over the last week,  patient's CPAP nose piece has been defective and patient has been unable to wear her CPAP machine for over a week.  In addition to that, [name] reports that the facility thought the patient was anxious and has been giving patient Ativan.  She developed walking diffiuclty, dermatomyositis in 2020.  Patient at baseline is bedbound and/or wheelchair-bound. Per sister, [name] at bedside, patient participates with PT for upper extremities.   Social history: She lives in a full care unit as she is bedbound. Patient quit tobacco use a long time ago. She does not drink etoh. No drug use. She formelry worked in [employer] for 40+ years.   ROS: Unable to complete as patient is lethargic and minimally arousable  ED Course: Discussed with emergency medicine provider, patient requiring hospitalization for chief concerns of acute on chronic hypoxic respiratory failure.  Assessment/Plan  Principal Problem:   Acute on chronic hypoxic respiratory failure (HCC) Active Problems:   Essential hypertension   Hyperlipidemia   Morbid obesity (HCC)   Pulmonary embolism (HCC)   Poorly controlled type 2 diabetes mellitus with circulatory disorder (HCC)   Elevated troponin   OSA (obstructive sleep apnea)   Bedbound   Adult onset dermatomyositis (HCC)   Altered mental status   Assessment and Plan:  * Acute on chronic hypoxic respiratory failure (HCC) With hypercapnic respiratory failure This is likely multifactorial in setting of bilateral vascular congestion with trace edema complicated by patient has obstructive sleep apnea who wears BiPAP mask nightly however has had defective nose piece and has not been able to wear her CPAP mask for 1 week and Ativan use with morbid obesity In a  patient with dermatomyositis Strict I's and O's Complete echo ordered Furosemide 40 mg IV twice daily 2 doses ordered for 04/30/2023 Continue BiPAP Admit to stepdown, inpatient  Altered mental status Patient minimally arousable at  bedside, she does open her eyes with sternal and verbal stimuli She does not move her lower extremities, which is baseline for patient CT of the head without contrast has been ordered  Adult onset dermatomyositis (HCC) Records from [place] have not uploaded to Care Everywhere  Bedbound Or wheelchair-bound Per sister, patient has not walked for 3 years At baseline currently, patient participates with physical therapy doing upper extremity and pulling herself up to standing position  OSA (obstructive sleep apnea) Patient currently on BiPAP continuously Patient would need CPAP nightly once patient transitions off BiPAP continuously  Elevated troponin Etiology workup in progress, differentials include demand ischemia in setting of acute on chronic hypoxic respiratory failure per above Heparin per pharmacy has been initiated We will continue to follow third high sensitive troponin If high sensitive troponin continues to trend up, would recommend a.m. team to consider complete echo and/or cardiology consultation for evaluation  Poorly controlled type 2 diabetes mellitus with circulatory disorder (HCC) Insulin-dependent diabetes mellitus Insulin SSI with agents coverage ordered Pending med reconciliation for long-acting insulin  Morbid obesity (HCC) This complicates overall care and prognosis.   Chart reviewed.   AM team to complete med reconciliation  DVT prophylaxis: Heparin per pharmacy Code Status: Full code Diet: N.p.o. Family Communication: Updated Sister [name] at bedside who is also healthcare power of attorney.  Her sister is her only next of kin Disposition Plan: Pending clinical course Consults called: None at this time Admission status: inpatient, step down  Past Medical History:  Diagnosis Date   Calculus of kidney    Diabetes mellitus without complication (HCC)    DVT (deep venous thrombosis) (HCC)    a. on Eliquis   Family history of early CAD    a.  parents passing in their 68's from CAD   Gout, unspecified    Heart murmur    Hyperlipidemia    Hypertension    Morbid obesity (HCC)    Normal cardiac stress test    a. equivocal study, sig soft tissue artifact present, no chest discomfort or ECG changes, perfusion images suggest mod sized region of mild reversible perfusion defect. Findings may be 2/2 shifting soft tissue attenuation, but cannot r/o ischemia, EF 72%   Phlebitis and thrombophlebitis of other deep vessels of lower extremities    Phlebitis and thrombophlebitis of other deep vessels of lower extremities    Pulmonary emboli (HCC)    a. on Eliquis   Spinal stenosis, unspecified region other than cervical    Tobacco abuse    Type II or unspecified type diabetes mellitus without mention of complication, uncontrolled    Unspecified sleep apnea    Urinary tract infection, site not specified    Past Surgical History:  Procedure Laterality Date   ABDOMINAL HYSTERECTOMY     hyperplasia of endometrium   ANKLE SURGERY     fracture s/p pin, right ankle   APPENDECTOMY     CHOLECYSTECTOMY     COLECTOMY     temporary colostomy, now reversed   INTESTINAL BYPASS     ovarian cyst ruptured, led to perforated intestine   STOMACH SURGERY     Social History:  reports that she quit smoking about 23 years ago. She has a 10.00 pack-year smoking history. She has never used  smokeless tobacco. She reports current alcohol use. She reports that she does not use drugs.  Allergies  Allergen Reactions   Sulfa Antibiotics Other (See Comments)    Reaction: isn't certain, thinks she ran a fever, or had a rash, maybe both.   Family History  Problem Relation Age of Onset   Heart attack Mother 36   Cancer Mother        breast   Heart attack Father 85   Arthritis Sister    Family history: Family history reviewed and not pertinent  Prior to Admission medications   Medication Sig Start Date End Date Taking? Authorizing Provider  amLODipine  (NORVASC) 5 MG tablet Take 1 tablet (5 mg total) by mouth daily. 05/30/19   McLean-Scocuzza, Pasty Spillers, MD  Cholecalciferol (VITAMIN D) 1000 UNITS capsule Take 2,000 Units by mouth daily.    [provider]  diclofenac sodium (VOLTAREN) 1 % GEL APP 2 GRAMS EXT AA TID 02/08/19   [provider]  ELIQUIS 5 MG TABS tablet TAKE 1 TABLET(5 MG) BY MOUTH TWICE DAILY 03/29/18   Antonieta Iba, MD  esomeprazole (NEXIUM) 40 MG capsule Take 40 mg by mouth daily before breakfast.    [provider]  fenofibrate 54 MG tablet Take 54 mg by mouth daily.    [provider]  fluticasone (FLONASE) 50 MCG/ACT nasal spray Place 2 sprays into both nostrils daily as needed for allergies or rhinitis. 02/21/19   McLean-Scocuzza, Pasty Spillers, MD  furosemide (LASIX) 40 MG tablet Take 1 tablet (40 mg total) by mouth daily. 01/09/18   Antonieta Iba, MD  Insulin Aspart (NOVOLOG FLEXPEN [place]) Inject 25 Units into the skin 2 (two) times daily.     [provider]  Insulin Degludec (TRESIBA FLEXTOUCH [place]) Inject 150 Units into the skin daily.    [provider]  ipratropium (ATROVENT) 0.06 % nasal spray Place 2 sprays into both nostrils 3 (three) times daily. 02/21/19   McLean-Scocuzza, Pasty Spillers, MD  JANUMET 50-500 MG tablet TK 1 T PO BID 09/20/16   [provider]  linaclotide (LINZESS) 72 MCG capsule Take 1 capsule (72 mcg total) by mouth daily before breakfast. 05/11/19   McLean-Scocuzza, Pasty Spillers, MD  loratadine (CLARITIN) 10 MG tablet Take 1 tablet (10 mg total) by mouth daily as needed for allergies. 02/21/19   McLean-Scocuzza, Pasty Spillers, MD  metoprolol succinate (TOPROL-XL) 50 MG 24 hr tablet Take 1 tablet (50 mg total) by mouth daily. Take with or immediately following a meal. 05/30/19   McLean-Scocuzza, Pasty Spillers, MD  mometasone (ELOCON) 0.1 % lotion APPLY AA BID FOR 2 WEEKS 11/03/18   [provider]  Multiple Vitamin (MULTIVITAMIN) tablet Take 1 tablet by mouth daily.     [provider]  OZEMPIC 1 MG/DOSE SOPN INJECT 1 MG SUBCUNTANEOUSLY ONCE A WEEK 12/08/17   [provider]  potassium chloride (K-DUR) 10 MEQ tablet Take 1 tablet (10 mEq total) by mouth daily as needed (Daily with Lasix (Furosemide)). 01/09/18   Antonieta Iba, MD  rosuvastatin (CRESTOR) 20 MG tablet Take 1 tablet (20 mg total) by mouth daily. 01/09/18   Antonieta Iba, MD  sodium chloride (OCEAN) 0.65 % SOLN nasal spray Place 2 sprays into both nostrils daily as needed for congestion. 1st before flonase and atrovent 02/21/19   McLean-Scocuzza, Pasty Spillers, MD  telmisartan-hydrochlorothiazide (MICARDIS HCT) 80-12.5 MG tablet Take 1 tablet by mouth daily. 01/09/18   Antonieta Iba, MD  Physical Exam: Vitals:   04/29/23 1732 04/29/23 1733 04/29/23 1736 04/29/23 1800  BP:  (!) 137/54    Pulse: 78 81 77   Resp: 17 17 15    Temp:      TempSrc:      SpO2: 94% 93% 93%   Weight:      Height:    5\' 2"  (1.575 m)   Constitutional: appears older than chronological age, chronically ill, currently acutely ill, lethargic, Eyes: PERRL, lids and conjunctivae normal, bilateral pinpoint pupils, reactive to light ENMT: Mucous membranes are dry. Posterior pharynx clear of any exudate or lesions. Unable to assess hearing or dentition Neck: normal, supple, no masses, no thyromegaly Respiratory: Generalized decreased lung sounds bilaterally, no wheezing, no crackles. BiPAP mask in place Cardiovascular: Regular rate and rhythm, no murmurs / rubs / gallops. No extremity edema. 2+ pedal pulses. No carotid bruits.  Abdomen: Morbidly obese abdomen, no tenderness, no masses palpated, no hepatosplenomegaly. Bowel sounds positive.  Musculoskeletal: no clubbing / cyanosis. No joint deformity upper and lower extremities. Good ROM, no contractures, no atrophy. Normal muscle tone.  Skin: no rashes, lesions, ulcers. No induration Neurologic: Sensation intact. Strength 5/5 in all 4.  Psychiatric: Unable to  assess judgment, insight, alertness, mood as patient is lethargic and altered   EKG: independently reviewed, showing sinus tachycardia with rate of 105, QTc 463  Chest x-ray on Admission: I personally reviewed and I agree with radiologist reading as below.  DG Chest Portable 1 View  Result Date: 04/29/2023 CLINICAL DATA:  Shortness of breath EXAM: PORTABLE CHEST 1 VIEW COMPARISON:  X-ray 12/08/2016 FINDINGS: Underinflation. Mildly enlarged cardiopericardial silhouette with calcified aorta and evidence of vascular congestion with trace edema. Small left effusion as well. Thickening along the minor fissure. No pneumothorax. Overlapping cardiac leads. Calcified aorta. IMPRESSION: Enlarged heart with vascular congestion and trace edema. Small left effusion. Electronically Signed   By: Karen Kays M.D.   On: 04/29/2023 15:41    Labs on Admission: I have personally reviewed following labs  CBC: Recent Labs  Lab 04/29/23 1507  WBC 11.4*  NEUTROABS 10.3*  HGB 10.9*  HCT 37.2  MCV 98.9  PLT 200   Basic Metabolic Panel: Recent Labs  Lab 04/29/23 1507  NA 143  K 4.5  CL 98  CO2 28  GLUCOSE 338*  BUN 49*  CREATININE 1.04*  CALCIUM 9.1   GFR: Estimated Creatinine Clearance: 67.1 mL/min (A) (by C-G formula based on SCr of 1.04 mg/dL (H)).  Liver Function Tests: Recent Labs  Lab 04/29/23 1507  AST 15  ALT 8  ALKPHOS 56  BILITOT 0.8  PROT 7.2  ALBUMIN 3.4*   Urine analysis:    Component Value Date/Time   COLORURINE YELLOW 05/24/2019 0929   APPEARANCEUR CLOUDY (A) 05/24/2019 0929   LABSPEC 1.017 05/24/2019 0929   PHURINE 5.5 05/24/2019 0929   GLUCOSEU NEGATIVE 05/24/2019 0929   HGBUR 3+ (A) 05/24/2019 0929   BILIRUBINUR small (A) 12/08/2016 0858   KETONESUR NEGATIVE 05/24/2019 0929   PROTEINUR 1+ (A) 05/24/2019 0929   UROBILINOGEN 0.2 12/08/2016 0858   UROBILINOGEN 0.2 07/25/2009 1159   NITRITE NEGATIVE 05/24/2019 0929   LEUKOCYTESUR 3+ (A) 05/24/2019 0929    CRITICAL CARE Performed by: Dr. Sedalia Muta  Total critical care time: 35 minutes  Critical care time was exclusive of separately billable procedures and treating other patients.  Critical care was necessary to treat or prevent imminent or life-threatening deterioration.  Critical care was time spent personally by me  on the following activities: development of treatment plan with patient and/or surrogate as well as nursing, discussions with consultants, evaluation of patient's response to treatment, examination of patient, obtaining history from patient or surrogate, ordering and performing treatments and interventions, ordering and review of laboratory studies, ordering and review of radiographic studies, pulse oximetry and re-evaluation of patient's condition.  This document was prepared using Dragon Voice Recognition software and may include unintentional dictation errors.  Dr. Sedalia Muta [facility]  If 7PM-7AM, please contact overnight-coverage provider If 7AM-7PM, please contact day attending provider [email or web address]  04/29/2023, 6:51 PM

## 2023-04-29 NOTE — Assessment & Plan Note (Addendum)
Etiology workup in progress, differentials include demand ischemia in setting of acute on chronic hypoxic respiratory failure per above Heparin per pharmacy has been initiated We will continue to follow third high sensitive troponin If high sensitive troponin continues to trend up, would recommend a.m. team to consider complete echo and/or cardiology consultation for evaluation

## 2023-04-29 NOTE — Assessment & Plan Note (Addendum)
With hypercapnic respiratory failure This is likely multifactorial in setting of bilateral vascular congestion with trace edema complicated by patient has obstructive sleep apnea who wears BiPAP mask nightly however has had defective nose piece and has not been able to wear her CPAP mask for 1 week and Ativan use with morbid obesity In a patient with dermatomyositis Strict I's and O's Complete echo ordered Furosemide 40 mg IV twice daily 2 doses ordered for 04/30/2023 Continue BiPAP Admit to stepdown, inpatient

## 2023-04-30 DIAGNOSIS — J9601 Acute respiratory failure with hypoxia: Principal | ICD-10-CM

## 2023-04-30 DIAGNOSIS — M3310 Other dermatopolymyositis, organ involvement unspecified: Secondary | ICD-10-CM

## 2023-04-30 DIAGNOSIS — Z7401 Bed confinement status: Secondary | ICD-10-CM

## 2023-04-30 DIAGNOSIS — G9341 Metabolic encephalopathy: Secondary | ICD-10-CM

## 2023-04-30 DIAGNOSIS — E1159 Type 2 diabetes mellitus with other circulatory complications: Secondary | ICD-10-CM

## 2023-04-30 LAB — TROPONIN I (HIGH SENSITIVITY)
Troponin I (High Sensitivity): 1678 ng/L (ref <18)
Troponin I (High Sensitivity): 1669 ng/L (ref <18)

## 2023-04-30 LAB — BASIC METABOLIC PANEL
Anion gap: 13 (ref 5–15)
BUN: 63 mg/dL — ABNORMAL HIGH (ref 8–23)
CO2: 28 mmol/L (ref 22–32)
Calcium: 9.2 mg/dL (ref 8.9–10.3)
Chloride: 100 mmol/L (ref 98–111)
Creatinine, Ser: 1.09 mg/dL — ABNORMAL HIGH (ref 0.44–1.00)
GFR, Estimated: 55 mL/min — ABNORMAL LOW (ref >60)
Glucose, Bld: 225 mg/dL — ABNORMAL HIGH (ref 70–99)
Potassium: 5.1 mmol/L (ref 3.5–5.1)
Sodium: 141 mmol/L (ref 135–145)

## 2023-04-30 LAB — CBC
HCT: 35 % — ABNORMAL LOW (ref 36.0–46.0)
Hemoglobin: 10.4 g/dL — ABNORMAL LOW (ref 12.0–15.0)
MCH: 29.4 pg (ref 26.0–34.0)
MCHC: 29.7 g/dL — ABNORMAL LOW (ref 30.0–36.0)
MCV: 98.9 fL (ref 80.0–100.0)
Platelets: 218 10*3/uL (ref 150–400)
RBC: 3.54 MIL/uL — ABNORMAL LOW (ref 3.87–5.11)
RDW: 18.1 % — ABNORMAL HIGH (ref 11.5–15.5)
WBC: 5.7 10*3/uL (ref 4.0–10.5)
nRBC: 0 % (ref 0.0–0.2)

## 2023-04-30 LAB — APTT
aPTT: 41 seconds — ABNORMAL HIGH (ref 24–36)
aPTT: 65 seconds — ABNORMAL HIGH (ref 24–36)
aPTT: 65 seconds — ABNORMAL HIGH (ref 24–36)

## 2023-04-30 LAB — GLUCOSE, CAPILLARY
Glucose-Capillary: 273 mg/dL — ABNORMAL HIGH (ref 70–99)
Glucose-Capillary: 267 mg/dL — ABNORMAL HIGH (ref 70–99)

## 2023-04-30 LAB — CBG MONITORING, ED
Glucose-Capillary: 216 mg/dL — ABNORMAL HIGH (ref 70–99)
Glucose-Capillary: 195 mg/dL — ABNORMAL HIGH (ref 70–99)

## 2023-04-30 LAB — CULTURE, BLOOD (ROUTINE X 2): Special Requests: ADEQUATE

## 2023-04-30 MED ORDER — ADULT MULTIVITAMIN W/MINERALS CH
1.0000 | ORAL_TABLET | Freq: Every day | ORAL | Status: DC
  Administered 2023-04-30 – 2023-05-05 (×6): 1 via ORAL
  Filled 2023-04-30 (×6): qty 1

## 2023-04-30 MED ORDER — ROSUVASTATIN CALCIUM 10 MG PO TABS
20.0000 mg | ORAL_TABLET | Freq: Every day | ORAL | Status: DC
  Administered 2023-04-30 – 2023-05-05 (×6): 20 mg via ORAL
  Filled 2023-04-30: qty 1
  Filled 2023-04-30 (×5): qty 2

## 2023-04-30 MED ORDER — IPRATROPIUM-ALBUTEROL 0.5-2.5 (3) MG/3ML IN SOLN: 3.0000 mL | RESPIRATORY_TRACT | Status: DC | PRN

## 2023-04-30 MED ORDER — FLUTICASONE FUROATE-VILANTEROL 100-25 MCG/ACT IN AEPB
1.0000 | INHALATION_SPRAY | Freq: Every day | RESPIRATORY_TRACT | Status: DC
  Administered 2023-04-30 – 2023-05-05 (×6): 1 via RESPIRATORY_TRACT
  Filled 2023-04-30: qty 28

## 2023-04-30 MED ORDER — HEPARIN BOLUS VIA INFUSION
1200.0000 [IU] | Freq: Once | INTRAVENOUS | Status: AC
  Administered 2023-04-30: 1200 [IU] via INTRAVENOUS
  Filled 2023-04-30: qty 1200

## 2023-04-30 MED ORDER — FUROSEMIDE 40 MG PO TABS
40.0000 mg | ORAL_TABLET | Freq: Every day | ORAL | Status: DC
  Administered 2023-04-30 – 2023-05-01 (×2): 40 mg via ORAL
  Filled 2023-04-30 (×2): qty 1

## 2023-04-30 MED ORDER — BASAGLAR KWIKPEN 100 UNIT/ML ~~LOC~~ SOPN: 40.0000 [IU] | PEN_INJECTOR | Freq: Every day | SUBCUTANEOUS | Status: DC

## 2023-04-30 MED ORDER — SPIRONOLACTONE 25 MG PO TABS
25.0000 mg | ORAL_TABLET | Freq: Every day | ORAL | Status: DC
  Administered 2023-04-30 – 2023-05-05 (×5): 25 mg via ORAL
  Filled 2023-04-30 (×6): qty 1

## 2023-04-30 MED ORDER — HEPARIN BOLUS VIA INFUSION
2500.0000 [IU] | Freq: Once | INTRAVENOUS | Status: AC
  Administered 2023-04-30: 2500 [IU] via INTRAVENOUS
  Filled 2023-04-30: qty 2500

## 2023-04-30 MED ORDER — UMECLIDINIUM BROMIDE 62.5 MCG/ACT IN AEPB
1.0000 | INHALATION_SPRAY | Freq: Every day | RESPIRATORY_TRACT | Status: DC
  Administered 2023-04-30 – 2023-05-05 (×6): 1 via RESPIRATORY_TRACT
  Filled 2023-04-30: qty 7

## 2023-04-30 MED ORDER — METOPROLOL SUCCINATE ER 25 MG PO TB24
25.0000 mg | ORAL_TABLET | Freq: Every day | ORAL | Status: DC
  Administered 2023-04-30 – 2023-05-05 (×6): 25 mg via ORAL
  Filled 2023-04-30 (×6): qty 1

## 2023-04-30 MED ORDER — PANTOPRAZOLE SODIUM 40 MG PO TBEC
40.0000 mg | DELAYED_RELEASE_TABLET | Freq: Every day | ORAL | Status: DC
  Administered 2023-04-30 – 2023-05-05 (×6): 40 mg via ORAL
  Filled 2023-04-30 (×6): qty 1

## 2023-04-30 MED ORDER — METHYLPREDNISOLONE SODIUM SUCC 40 MG IJ SOLR
40.0000 mg | Freq: Two times a day (BID) | INTRAMUSCULAR | Status: DC
  Administered 2023-04-30 – 2023-05-01 (×3): 40 mg via INTRAVENOUS
  Filled 2023-04-30 (×3): qty 1

## 2023-04-30 MED ORDER — AZATHIOPRINE 50 MG PO TABS
100.0000 mg | ORAL_TABLET | Freq: Two times a day (BID) | ORAL | Status: DC
  Administered 2023-04-30 – 2023-05-05 (×11): 100 mg via ORAL
  Filled 2023-04-30 (×11): qty 2

## 2023-04-30 MED ORDER — INSULIN GLARGINE-YFGN 100 UNIT/ML ~~LOC~~ SOLN
40.0000 [IU] | Freq: Every day | SUBCUTANEOUS | Status: DC
  Administered 2023-04-30 – 2023-05-01 (×2): 40 [IU] via SUBCUTANEOUS
  Filled 2023-04-30 (×3): qty 0.4

## 2023-04-30 NOTE — Plan of Care (Signed)

## 2023-04-30 NOTE — Progress Notes (Signed)
ANTICOAGULATION CONSULT NOTE -   Pharmacy Consult for Heparin drip Indication: chest pain/ACS, on apixaban PTA for hx DVT/PE  Allergies  Allergen Reactions   Sulfa Antibiotics Other (See Comments)    Reaction: isn't certain, thinks she ran a fever, or had a rash, maybe both.    Patient Measurements: Height: 5\' 2"  (157.5 cm) Weight: 133 kg (293 lb 3.4 oz) IBW/kg (Calculated) : 50.1 Heparin Dosing Weight: 83.7 kg  Vital Signs: Temp: 98.4 F (36.9 C) (06/01 1402) Temp Source: Axillary (06/01 0510) BP: 143/50 (06/01 1402) Pulse Rate: 78 (06/01 1402)  Labs: Recent Labs    04/29/23 1507 04/29/23 1715 04/29/23 1917 04/29/23 2200 04/30/23 0511 04/30/23 1300  HGB 10.9*  --   --   --  10.4*  --   HCT 37.2  --   --   --  35.0*  --   PLT 200  --   --   --  218  --   APTT  --   --  30  --  41* 65*  HEPARINUNFRC  --   --  >1.10*  --   --   --   CREATININE 1.04*  --   --   --   --  1.09*  TROPONINIHS 40*   < > 1,150* 1,085*  --  1,678*   < > = values in this interval not displayed.     Estimated Creatinine Clearance: 64.1 mL/min (A) (by C-G formula based on SCr of 1.09 mg/dL (H)).   Medical History: Past Medical History:  Diagnosis Date   Calculus of kidney    Diabetes mellitus without complication (HCC)    DVT (deep venous thrombosis) (HCC)    a. on Eliquis   Family history of early CAD    a. parents passing in their 33's from CAD   Gout, unspecified    Heart murmur    Hyperlipidemia    Hypertension    Morbid obesity (HCC)    Normal cardiac stress test    a. equivocal study, sig soft tissue artifact present, no chest discomfort or ECG changes, perfusion images suggest mod sized region of mild reversible perfusion defect. Findings may be 2/2 shifting soft tissue attenuation, but cannot r/o ischemia, EF 72%   Phlebitis and thrombophlebitis of other deep vessels of lower extremities    Phlebitis and thrombophlebitis of other deep vessels of lower extremities     Pulmonary emboli (HCC)    a. on Eliquis   Spinal stenosis, unspecified region other than cervical    Tobacco abuse    Type II or unspecified type diabetes mellitus without mention of complication, uncontrolled    Unspecified sleep apnea    Urinary tract infection, site not specified     Medications:  Medications Prior to Admission  Medication Sig Dispense Refill Last Dose   atorvastatin (LIPITOR) 40 MG tablet Take by mouth.   04/29/2023   azaTHIOprine (IMURAN) 50 MG tablet Take 100 mg by mouth 2 (two) times daily.   04/29/2023   Cholecalciferol (VITAMIN D) 1000 UNITS capsule Take 2,000 Units by mouth daily.   04/29/2023   ELIQUIS 5 MG TABS tablet TAKE 1 TABLET(5 MG) BY MOUTH TWICE DAILY 60 tablet 0 04/29/2023 at 0900   famotidine (PEPCID) 20 MG tablet Take by mouth.   04/29/2023   FIASP 100 UNIT/ML SOLN Inject into the skin.   Past Month   fluticasone (FLONASE) 50 MCG/ACT nasal spray Place 2 sprays into both nostrils daily as needed for allergies  or rhinitis. 16 g 12    furosemide (LASIX) 40 MG tablet Take 1 tablet (40 mg total) by mouth daily. 30 tablet 11 04/29/2023   Insulin Aspart (NOVOLOG FLEXPEN [place]) Inject 25 Units into the skin 2 (two) times daily.    Past Month   Insulin Degludec (TRESIBA FLEXTOUCH [place]) Inject 150 Units into the skin daily.   04/29/2023 at 1100   Insulin Glargine (BASAGLAR [place]PEN) 100 UNIT/ML Inject 40 Units into the skin at bedtime.   04/28/2023   JARDIANCE 10 MG TABS tablet Take 10 mg by mouth daily.   04/29/2023   loratadine (CLARITIN) 10 MG tablet Take 1 tablet (10 mg total) by mouth daily as needed for allergies. 90 tablet 3 04/29/2023   LORazepam (ATIVAN) 0.5 MG tablet Take 0.5 mg by mouth once.   04/29/2023   Multiple Vitamin (MULTIVITAMIN) tablet Take 1 tablet by mouth daily.   04/29/2023   pregabalin (LYRICA) 150 MG capsule Take 150 mg by mouth in the morning, at noon, and at bedtime.   04/29/2023   spironolactone (ALDACTONE) 25 MG tablet Take 25 mg by mouth daily.    04/29/2023   TRELEGY ELLIPTA 100-62.5-25 MCG/ACT AEPB Inhale into the lungs.   04/29/2023   albuterol (VENTOLIN HFA) 108 (90 Base) MCG/ACT inhaler Inhale into the lungs. (Patient not taking: Reported on 04/29/2023)   Not Taking   amLODipine (NORVASC) 5 MG tablet Take 1 tablet (5 mg total) by mouth daily. (Patient not taking: Reported on 04/29/2023) 90 tablet 3 Not Taking   diclofenac sodium (VOLTAREN) 1 % GEL APP 2 GRAMS EXT AA TID (Patient not taking: Reported on 04/29/2023)   Not Taking   esomeprazole (NEXIUM) 40 MG capsule Take 40 mg by mouth daily before breakfast. (Patient not taking: Reported on 04/29/2023)   Not Taking   fenofibrate 54 MG tablet Take 54 mg by mouth daily. (Patient not taking: Reported on 04/29/2023)   Not Taking   ipratropium (ATROVENT) 0.06 % nasal spray Place 2 sprays into both nostrils 3 (three) times daily. (Patient not taking: Reported on 04/29/2023) 15 mL 12 Not Taking   JANUMET 50-500 MG tablet TK 1 T PO BID  1    linaclotide (LINZESS) 72 MCG capsule Take 1 capsule (72 mcg total) by mouth daily before breakfast. (Patient not taking: Reported on 04/29/2023) 30 capsule 0 Not Taking   methocarbamol (ROBAXIN) 750 MG tablet Take by mouth. (Patient not taking: Reported on 04/29/2023)   Not Taking   metoprolol succinate (TOPROL-XL) 50 MG 24 hr tablet Take 1 tablet (50 mg total) by mouth daily. Take with or immediately following a meal. (Patient not taking: Reported on 04/29/2023) 90 tablet 3 Not Taking   mometasone (ELOCON) 0.1 % lotion APPLY AA BID FOR 2 WEEKS (Patient not taking: Reported on 04/29/2023)   Not Taking   OZEMPIC 1 MG/DOSE SOPN INJECT 1 MG SUBCUNTANEOUSLY ONCE A WEEK  0    potassium chloride (K-DUR) 10 MEQ tablet Take 1 tablet (10 mEq total) by mouth daily as needed (Daily with Lasix (Furosemide)). (Patient not taking: Reported on 04/29/2023) 30 tablet 11 Not Taking   rosuvastatin (CRESTOR) 20 MG tablet Take 1 tablet (20 mg total) by mouth daily. (Patient not taking: Reported  on 04/29/2023) 30 tablet 11 Not Taking   sodium chloride (OCEAN) 0.65 % SOLN nasal spray Place 2 sprays into both nostrils daily as needed for congestion. 1st before flonase and atrovent (Patient not taking: Reported on 04/29/2023) 1 Bottle 12 Not Taking  telmisartan-hydrochlorothiazide (MICARDIS HCT) 80-12.5 MG tablet Take 1 tablet by mouth daily. (Patient not taking: Reported on 04/29/2023) 30 tablet 11 Not Taking   Scheduled:   azaTHIOprine  100 mg Oral BID   fluticasone furoate-vilanterol  1 puff Inhalation Daily   And   umeclidinium bromide  1 puff Inhalation Daily   furosemide  40 mg Oral Daily   insulin aspart  0-20 Units Subcutaneous TID WC   insulin aspart  0-5 Units Subcutaneous QHS   insulin glargine-yfgn  40 Units Subcutaneous QHS   methylPREDNISolone (SOLU-MEDROL) injection  40 mg Intravenous Q12H   metoprolol succinate  25 mg Oral Daily   multivitamin with minerals  1 tablet Oral Daily   pantoprazole  40 mg Oral QAC breakfast   rosuvastatin  20 mg Oral Daily   spironolactone  25 mg Oral Daily   Infusions:   heparin 1,350 Units/hr (04/30/23 1610)    Assessment: 66 yo F to start heparin drip for ACS/STEMI.  Patient on apixaban PTA for hx DVT/PE. Patient from nursing facility, bedbound.  Currently with AMS.  Last apixaban given 0900 5/31 Baseline:  hgb 10.9  plt 200   aPTT 30  Heparin level >1.10  (on apixaban)   Goal of Therapy:  Heparin level 0.3-0.7 units/ml aPTT 66-102 seconds Monitor platelets by anticoagulation protocol: Yes   06/01 0511 aPTT 41, subtherapeutic 06/01 1300 aPTT 65. Barely subtherapeutic  1350 > 1500 u/hr  Plan:  06/01 1300 aPTT 65. Subtherapeutic, barely. Bolus 1200 units x 1 Increase heparin drip to 1500 units/hr Will check aPTT in 6 hours Will check Heparin level at least once daily and when HL and aPTT are correlating will switch to following HL. CBC daily  Bari Mantis PharmD Clinical Pharmacist 04/30/2023

## 2023-04-30 NOTE — Progress Notes (Signed)
Progress Note   Patient: Sonya Sanchez ZOX:[id number] DOB: 1956/03/02 DOA: 04/29/2023     1 DOS: the patient was seen and examined on 04/30/2023   Brief hospital course: Ms.Sonya Sanchez is a 67 year old female with history of insulin-dependent diabetes mellitus, GERD, hypertension, hyperlipidemia, morbid obesity, OSA on CPAP, history of DVT, PE on anticoagulation, chronic respiratory failure on 2 L nasal cannula at baseline, dermatomyositis, baseline bedbound (has not walked for 3 years) prior smoking history, presented to emergency department for chief concerns of respiratory distress.  Patient is not wearing CPAP at the facility.  She was more lethargic upon presentation, troponins elevated, ABG showed high CO2, low oxygen.  Patient started on BiPAP in the ED and gradually weaned to 2 L supplemental oxygen.  She is started on Lasix, DuoNebs, heparin drip admitted to stepdown unit.   Assessment and Plan: * Acute on chronic hypoxic and hypercapnic respiratory failure (HCC) Possibly multifactorial in the setting of CHF exacerbation, noncompliance with CPAP, Ativan use with morbid obesity. She has been weaned off Bipap. CPAP at night. (Non functioning CPAP at facility per sister) IV Lasix changed to oral home dose. Strict I's and O's. Solu-Medrol 40 mg every 12 hourly for today, taper to oral tomorrow. Continue DuoNebs, supplemental oxygen to maintain saturation greater than 90% Downgraded to progressive unit  Lethargy/metabolic encephalopathy Due to metabolic derangements including hypercarbia, hypoxia. Bipap weaned off. Continue supplemental oxygen. Avoid sedatives, opiates, benzos. CT of the head without contrast unremarkable. She is more alert and able to answer me.  Adult onset dermatomyositis (HCC) Continue Azathioprine.  Bedbound Or wheelchair-bound PT/ OT evaluation.  OSA (obstructive sleep apnea) CPAP at night ordered.  Elevated troponin Possibly demand ischemia in setting of  acute on chronic hypercapnic, hypoxic respiratory failure. She denies chest pain Continue Heparin drip as per pharmacy. Trend troponin. Echo reviewed EF 60%, no wall motion abnl. If persistently elevated trops or chest pain, will consult cardiology.  H/o DVT - on Eliquis, held as she is on Heparin drip per protocol. Will restart oral AC in 24hrs  Poorly controlled type 2 diabetes mellitus with circulatory disorder (HCC) Insulin-dependent diabetes mellitus Insulin sliding scale ordered. Sugars may worsen as she  Lantus home dose initiated. She has good appetite, eating fair.  Morbid obesity (HCC) This complicates overall care and prognosis.  Diet, exercise and weight reduction advised.  PT/ OT evaluation. TOC consult for dispo. DVT porphylaxis - Heparin drip.    Subjective: Patient is seen and examined today morning.  She is lying in bed.  Shortness of breath much improved.  More alert, awake, able to answer me.  Asks if she can eat.  BiPAP has been weaned off, currently on 2 L supplemental oxygen  Physical Exam: Vitals:   04/30/23 0700 04/30/23 0752 04/30/23 0800 04/30/23 0946  BP: (!) 114/43  (!) 125/44 (!) 126/59  Pulse: 83  83 78  Resp: 11  14   Temp:      TempSrc:      SpO2: 97% 93% 91%   Weight:      Height:       General-elderly morbidly obese Caucasian female, mild respiratory distress. HEENT-PERRLA EOMI, nontender sinuses. Heart-S1-S2 heard, no murmurs.  Trace pedal edema. Lungs-distant breath sounds.  Diffuse rhonchi noted. Abdomen-soft, nontender, bowel sounds good. Neuro-awake oriented x 3, lower extremity weakness, nonfocal.  Data Reviewed:  CT brain, ABG, troponin, CBC, APTT  Family Communication: Discussed with patient's sister over phone about the care plan, she understands and agrees.  Disposition: Status is: Inpatient Remains inpatient appropriate because: respiratory failure, need   Planned Discharge Destination: Skilled nursing  facility    MDM level 3- Patient admitted with respiratory failure, is on high risk medication including heparin drip, need close hemodynamic, telemetry, neurologic monitoring. She is at high risk for sudden clinical deterioration.  Author: Marcelino Duster, MD 04/30/2023 11:25 AM  For on call review [email or web address].

## 2023-04-30 NOTE — Progress Notes (Signed)
ANTICOAGULATION CONSULT NOTE -   Pharmacy Consult for Heparin drip Indication: chest pain/ACS, on apixaban PTA for hx DVT/PE  Allergies  Allergen Reactions   Sulfa Antibiotics Other (See Comments)    Reaction: isn't certain, thinks she ran a fever, or had a rash, maybe both.    Patient Measurements: Height: 5\' 2"  (157.5 cm) Weight: 133 kg (293 lb 3.4 oz) IBW/kg (Calculated) : 50.1 Heparin Dosing Weight: 83.7 kg  Vital Signs: Temp: 98.3 F (36.8 C) (06/01 2102) Temp Source: Oral (06/01 2102) BP: 132/62 (06/01 2102) Pulse Rate: 73 (06/01 2102)  Labs: Recent Labs    04/29/23 1507 04/29/23 1715 04/29/23 1917 04/29/23 2200 04/30/23 0511 04/30/23 1300 04/30/23 1437 04/30/23 2044  HGB 10.9*  --   --   --  10.4*  --   --   --   HCT 37.2  --   --   --  35.0*  --   --   --   PLT 200  --   --   --  218  --   --   --   APTT  --    < > 30  --  41* 65*  --  65*  HEPARINUNFRC  --   --  >1.10*  --   --   --   --   --   CREATININE 1.04*  --   --   --   --  1.09*  --   --   TROPONINIHS 40*   < > 1,150* 1,085*  --  1,678* 1,669*  --    < > = values in this interval not displayed.     Estimated Creatinine Clearance: 64.1 mL/min (A) (by C-G formula based on SCr of 1.09 mg/dL (H)).   Medical History: Past Medical History:  Diagnosis Date   Calculus of kidney    Diabetes mellitus without complication (HCC)    DVT (deep venous thrombosis) (HCC)    a. on Eliquis   Family history of early CAD    a. parents passing in their 71's from CAD   Gout, unspecified    Heart murmur    Hyperlipidemia    Hypertension    Morbid obesity (HCC)    Normal cardiac stress test    a. equivocal study, sig soft tissue artifact present, no chest discomfort or ECG changes, perfusion images suggest mod sized region of mild reversible perfusion defect. Findings may be 2/2 shifting soft tissue attenuation, but cannot r/o ischemia, EF 72%   Phlebitis and thrombophlebitis of other deep vessels of lower  extremities    Phlebitis and thrombophlebitis of other deep vessels of lower extremities    Pulmonary emboli (HCC)    a. on Eliquis   Spinal stenosis, unspecified region other than cervical    Tobacco abuse    Type II or unspecified type diabetes mellitus without mention of complication, uncontrolled    Unspecified sleep apnea    Urinary tract infection, site not specified     Medications:  Medications Prior to Admission  Medication Sig Dispense Refill Last Dose   atorvastatin (LIPITOR) 40 MG tablet Take by mouth.   04/29/2023   azaTHIOprine (IMURAN) 50 MG tablet Take 100 mg by mouth 2 (two) times daily.   04/29/2023   Cholecalciferol (VITAMIN D) 1000 UNITS capsule Take 2,000 Units by mouth daily.   04/29/2023   ELIQUIS 5 MG TABS tablet TAKE 1 TABLET(5 MG) BY MOUTH TWICE DAILY 60 tablet 0 04/29/2023 at 0900   famotidine (  PEPCID) 20 MG tablet Take by mouth.   04/29/2023   FIASP 100 UNIT/ML SOLN Inject into the skin.   Past Month   fluticasone (FLONASE) 50 MCG/ACT nasal spray Place 2 sprays into both nostrils daily as needed for allergies or rhinitis. 16 g 12    furosemide (LASIX) 40 MG tablet Take 1 tablet (40 mg total) by mouth daily. 30 tablet 11 04/29/2023   Insulin Aspart (NOVOLOG FLEXPEN [place]) Inject 25 Units into the skin 2 (two) times daily.    Past Month   Insulin Degludec (TRESIBA FLEXTOUCH ) Inject 150 Units into the skin daily.   04/29/2023 at 1100   Insulin Glargine (BASAGLAR KWIKPEN) 100 UNIT/ML Inject 40 Units into the skin at bedtime.   04/28/2023   JARDIANCE 10 MG TABS tablet Take 10 mg by mouth daily.   04/29/2023   loratadine (CLARITIN) 10 MG tablet Take 1 tablet (10 mg total) by mouth daily as needed for allergies. 90 tablet 3 04/29/2023   LORazepam (ATIVAN) 0.5 MG tablet Take 0.5 mg by mouth once.   04/29/2023   Multiple Vitamin (MULTIVITAMIN) tablet Take 1 tablet by mouth daily.   04/29/2023   pregabalin (LYRICA) 150 MG capsule Take 150 mg by mouth in the morning, at noon, and at  bedtime.   04/29/2023   spironolactone (ALDACTONE) 25 MG tablet Take 25 mg by mouth daily.   04/29/2023   TRELEGY ELLIPTA 100-62.5-25 MCG/ACT AEPB Inhale into the lungs.   04/29/2023   albuterol (VENTOLIN HFA) 108 (90 Base) MCG/ACT inhaler Inhale into the lungs. (Patient not taking: Reported on 04/29/2023)   Not Taking   amLODipine (NORVASC) 5 MG tablet Take 1 tablet (5 mg total) by mouth daily. (Patient not taking: Reported on 04/29/2023) 90 tablet 3 Not Taking   diclofenac sodium (VOLTAREN) 1 % GEL APP 2 GRAMS EXT AA TID (Patient not taking: Reported on 04/29/2023)   Not Taking   esomeprazole (NEXIUM) 40 MG capsule Take 40 mg by mouth daily before breakfast. (Patient not taking: Reported on 04/29/2023)   Not Taking   fenofibrate 54 MG tablet Take 54 mg by mouth daily. (Patient not taking: Reported on 04/29/2023)   Not Taking   ipratropium (ATROVENT) 0.06 % nasal spray Place 2 sprays into both nostrils 3 (three) times daily. (Patient not taking: Reported on 04/29/2023) 15 mL 12 Not Taking   JANUMET 50-500 MG tablet TK 1 T PO BID  1    linaclotide (LINZESS) 72 MCG capsule Take 1 capsule (72 mcg total) by mouth daily before breakfast. (Patient not taking: Reported on 04/29/2023) 30 capsule 0 Not Taking   methocarbamol (ROBAXIN) 750 MG tablet Take by mouth. (Patient not taking: Reported on 04/29/2023)   Not Taking   metoprolol succinate (TOPROL-XL) 50 MG 24 hr tablet Take 1 tablet (50 mg total) by mouth daily. Take with or immediately following a meal. (Patient not taking: Reported on 04/29/2023) 90 tablet 3 Not Taking   mometasone (ELOCON) 0.1 % lotion APPLY AA BID FOR 2 WEEKS (Patient not taking: Reported on 04/29/2023)   Not Taking   OZEMPIC 1 MG/DOSE SOPN INJECT 1 MG SUBCUNTANEOUSLY ONCE A WEEK  0    potassium chloride (K-DUR) 10 MEQ tablet Take 1 tablet (10 mEq total) by mouth daily as needed (Daily with Lasix (Furosemide)). (Patient not taking: Reported on 04/29/2023) 30 tablet 11 Not Taking   rosuvastatin  (CRESTOR) 20 MG tablet Take 1 tablet (20 mg total) by mouth daily. (Patient not taking: Reported on  04/29/2023) 30 tablet 11 Not Taking   sodium chloride (OCEAN) 0.65 % SOLN nasal spray Place 2 sprays into both nostrils daily as needed for congestion. 1st before flonase and atrovent (Patient not taking: Reported on 04/29/2023) 1 Bottle 12 Not Taking   telmisartan-hydrochlorothiazide (MICARDIS HCT) 80-12.5 MG tablet Take 1 tablet by mouth daily. (Patient not taking: Reported on 04/29/2023) 30 tablet 11 Not Taking   Scheduled:   azaTHIOprine  100 mg Oral BID   fluticasone furoate-vilanterol  1 puff Inhalation Daily   And   umeclidinium bromide  1 puff Inhalation Daily   furosemide  40 mg Oral Daily   heparin  1,200 Units Intravenous Once   insulin aspart  0-20 Units Subcutaneous TID WC   insulin aspart  0-5 Units Subcutaneous QHS   insulin glargine-yfgn  40 Units Subcutaneous QHS   methylPREDNISolone (SOLU-MEDROL) injection  40 mg Intravenous Q12H   metoprolol succinate  25 mg Oral Daily   multivitamin with minerals  1 tablet Oral Daily   pantoprazole  40 mg Oral QAC breakfast   rosuvastatin  20 mg Oral Daily   spironolactone  25 mg Oral Daily   Infusions:   heparin 1,500 Units/hr (04/30/23 1504)    Assessment: 67 yo F to start heparin drip for ACS/STEMI.  Patient on apixaban PTA for hx DVT/PE. Patient from nursing facility, bedbound.  Currently with AMS.  Last apixaban given 0900 5/31 Baseline:  hgb 10.9  plt 200   aPTT 30  Heparin level >1.10  (on apixaban)   Goal of Therapy:  Heparin level 0.3-0.7 units/ml aPTT 66-102 seconds Monitor platelets by anticoagulation protocol: Yes   06/01 0511 aPTT 41, subtherapeutic 06/01 1300 aPTT 65. Barely subtherapeutic  1350 > 1500 u/hr 03/04 2045 aPTT 65, Barely subtherapeutic  1500 > 1650  Plan:  06/01 20:45 aPTT 65. Subtherapeutic, barely. Bolus 1200 units x 1 Increase heparin drip to 1650 units/hr Will check aPTT in 6 hours Will check  Heparin level at least once daily and when HL and aPTT are correlating will switch to following HL. CBC daily  Clovia Cuff, PharmD, BCPS 04/30/2023 9:50 PM

## 2023-04-30 NOTE — Progress Notes (Signed)
Changed to nasal mask per pt request

## 2023-04-30 NOTE — ED Notes (Signed)
Thayer Ohm, RN verified Heparin rate change and Heparin Bolus from bag.

## 2023-04-30 NOTE — Progress Notes (Signed)
ANTICOAGULATION CONSULT NOTE - Initial Consult  Pharmacy Consult for Heparin drip Indication: chest pain/ACS, on apixaban PTA for hx DVT/PE  Allergies  Allergen Reactions   Sulfa Antibiotics Other (See Comments)    Reaction: isn't certain, thinks she ran a fever, or had a rash, maybe both.    Patient Measurements: Height: 5\' 2"  (157.5 cm) Weight: 133 kg (293 lb 3.4 oz) IBW/kg (Calculated) : 50.1 Heparin Dosing Weight: 83.7 kg  Vital Signs: Temp: 98.7 F (37.1 C) (06/01 0510) Temp Source: Axillary (06/01 0510) BP: 161/66 (06/01 0300) Pulse Rate: 71 (06/01 0300)  Labs: Recent Labs    04/29/23 1507 04/29/23 1715 04/29/23 1917 04/29/23 2200 04/30/23 0511  HGB 10.9*  --   --   --  10.4*  HCT 37.2  --   --   --  35.0*  PLT 200  --   --   --  218  APTT  --   --  30  --  41*  HEPARINUNFRC  --   --  >1.10*  --   --   CREATININE 1.04*  --   --   --   --   TROPONINIHS 40* 195* 1,150* 1,085*  --      Estimated Creatinine Clearance: 67.1 mL/min (A) (by C-G formula based on SCr of 1.04 mg/dL (H)).   Medical History: Past Medical History:  Diagnosis Date   Calculus of kidney    Diabetes mellitus without complication (HCC)    DVT (deep venous thrombosis) (HCC)    a. on Eliquis   Family history of early CAD    a. parents passing in their 10's from CAD   Gout, unspecified    Heart murmur    Hyperlipidemia    Hypertension    Morbid obesity (HCC)    Normal cardiac stress test    a. equivocal study, sig soft tissue artifact present, no chest discomfort or ECG changes, perfusion images suggest mod sized region of mild reversible perfusion defect. Findings may be 2/2 shifting soft tissue attenuation, but cannot r/o ischemia, EF 72%   Phlebitis and thrombophlebitis of other deep vessels of lower extremities    Phlebitis and thrombophlebitis of other deep vessels of lower extremities    Pulmonary emboli (HCC)    a. on Eliquis   Spinal stenosis, unspecified region other than  cervical    Tobacco abuse    Type II or unspecified type diabetes mellitus without mention of complication, uncontrolled    Unspecified sleep apnea    Urinary tract infection, site not specified     Medications:  (Not in a hospital admission) Scheduled:   insulin aspart  0-20 Units Subcutaneous TID WC   insulin aspart  0-5 Units Subcutaneous QHS   Infusions:   heparin 1,100 Units/hr (04/29/23 2107)    Assessment: 67 yo F to start heparin drip for ACS/STEMI.  Patient on apixaban PTA for hx DVT/PE. Patient from nursing facility, bedbound.  Currently with AMS.  Last apixaban given 0900 5/31 Baseline:  hgb 10.9  plt 200   aPTT 30  Heparin level >1.10  (on apixaban)   Goal of Therapy:  Heparin level 0.3-0.7 units/ml aPTT 66-102 seconds Monitor platelets by anticoagulation protocol: Yes   06/01 0511 aPTT 41, subtherapeutic  Plan:  Bolus 2500 units x 1 Increase heparin drip to 1350 units/hr Will check aPTT in 6 hours Will check Heparin level at lest once daily and when HL and aPTT are correlating will switch to following HL. CBC daily  Otelia Sergeant, PharmD, [facility] 04/30/2023 6:07 AM

## 2023-05-01 ENCOUNTER — Inpatient Hospital Stay (HOSPITAL_COMMUNITY)
Admit: 2023-05-01 | Discharge: 2023-05-01 | Disposition: A | Discharge status: Home / Self Care | Payer: Medicare Other | Attending: Internal Medicine | Admitting: Internal Medicine

## 2023-05-01 DIAGNOSIS — R7989 Other specified abnormal findings of blood chemistry: Secondary | ICD-10-CM | POA: Diagnosis not present

## 2023-05-01 DIAGNOSIS — R0609 Other forms of dyspnea: Secondary | ICD-10-CM | POA: Diagnosis not present

## 2023-05-01 DIAGNOSIS — I10 Essential (primary) hypertension: Secondary | ICD-10-CM

## 2023-05-01 DIAGNOSIS — G4733 Obstructive sleep apnea (adult) (pediatric): Secondary | ICD-10-CM | POA: Diagnosis not present

## 2023-05-01 DIAGNOSIS — J9621 Acute and chronic respiratory failure with hypoxia: Secondary | ICD-10-CM | POA: Diagnosis not present

## 2023-05-01 LAB — ECHOCARDIOGRAM COMPLETE
AR max vel: 1.09 cm2
AV Area VTI: 1.08 cm2
AV Area mean vel: 1.07 cm2
AV Mean grad: 10 mmHg
AV Peak grad: 18.1 mmHg
Ao pk vel: 2.13 m/s
Area-P 1/2: 2.12 cm2
Height: 62 in
MV VTI: 0.86 cm2
S' Lateral: 3 cm
Weight: 4691.39 oz

## 2023-05-01 LAB — BASIC METABOLIC PANEL
Anion gap: 12 (ref 5–15)
BUN: 68 mg/dL — ABNORMAL HIGH (ref 8–23)
CO2: 28 mmol/L (ref 22–32)
Calcium: 9.1 mg/dL (ref 8.9–10.3)
Chloride: 99 mmol/L (ref 98–111)
Creatinine, Ser: 1.14 mg/dL — ABNORMAL HIGH (ref 0.44–1.00)
GFR, Estimated: 52 mL/min — ABNORMAL LOW (ref >60)
Glucose, Bld: 322 mg/dL — ABNORMAL HIGH (ref 70–99)
Potassium: 5.2 mmol/L — ABNORMAL HIGH (ref 3.5–5.1)
Sodium: 139 mmol/L (ref 135–145)

## 2023-05-01 LAB — TROPONIN I (HIGH SENSITIVITY)
Troponin I (High Sensitivity): 958 ng/L (ref <18)
Troponin I (High Sensitivity): 904 ng/L (ref <18)
Troponin I (High Sensitivity): 859 ng/L (ref <18)

## 2023-05-01 LAB — CBC
HCT: 32 % — ABNORMAL LOW (ref 36.0–46.0)
Hemoglobin: 9.8 g/dL — ABNORMAL LOW (ref 12.0–15.0)
MCH: 28.7 pg (ref 26.0–34.0)
MCHC: 30.6 g/dL (ref 30.0–36.0)
MCV: 93.8 fL (ref 80.0–100.0)
Platelets: 180 10*3/uL (ref 150–400)
RBC: 3.41 MIL/uL — ABNORMAL LOW (ref 3.87–5.11)
RDW: 18 % — ABNORMAL HIGH (ref 11.5–15.5)
WBC: 7.9 10*3/uL (ref 4.0–10.5)
nRBC: 0 % (ref 0.0–0.2)

## 2023-05-01 LAB — GLUCOSE, CAPILLARY
Glucose-Capillary: 279 mg/dL — ABNORMAL HIGH (ref 70–99)
Glucose-Capillary: 298 mg/dL — ABNORMAL HIGH (ref 70–99)
Glucose-Capillary: 279 mg/dL — ABNORMAL HIGH (ref 70–99)
Glucose-Capillary: 355 mg/dL — ABNORMAL HIGH (ref 70–99)

## 2023-05-01 LAB — APTT
aPTT: 114 seconds — ABNORMAL HIGH (ref 24–36)
aPTT: 73 seconds — ABNORMAL HIGH (ref 24–36)

## 2023-05-01 LAB — CULTURE, BLOOD (ROUTINE X 2): Special Requests: ADEQUATE

## 2023-05-01 LAB — HEPARIN LEVEL (UNFRACTIONATED): Heparin Unfractionated: >1.1 IU/mL — ABNORMAL HIGH (ref 0.30–0.70)

## 2023-05-01 MED ORDER — PREDNISONE 20 MG PO TABS
20.0000 mg | ORAL_TABLET | Freq: Every day | ORAL | Status: AC
  Administered 2023-05-02 – 2023-05-04 (×3): 20 mg via ORAL
  Filled 2023-05-01 (×3): qty 1

## 2023-05-01 MED ORDER — FUROSEMIDE 10 MG/ML IJ SOLN
40.0000 mg | Freq: Two times a day (BID) | INTRAMUSCULAR | Status: DC
  Administered 2023-05-01 – 2023-05-02 (×2): 40 mg via INTRAVENOUS
  Filled 2023-05-01 (×2): qty 4

## 2023-05-01 MED ORDER — APIXABAN 5 MG PO TABS
5.0000 mg | ORAL_TABLET | Freq: Two times a day (BID) | ORAL | Status: DC
  Administered 2023-05-01 – 2023-05-02 (×2): 5 mg via ORAL
  Filled 2023-05-01 (×2): qty 1

## 2023-05-01 NOTE — Progress Notes (Signed)
ANTICOAGULATION CONSULT NOTE -   Pharmacy Consult for Heparin drip Indication: chest pain/ACS, on apixaban PTA for hx DVT/PE  Allergies  Allergen Reactions   Sulfa Antibiotics Other (See Comments)    Reaction: isn't certain, thinks she ran a fever, or had a rash, maybe both.    Patient Measurements: Height: 5\' 2"  (157.5 cm) Weight: 133 kg (293 lb 3.4 oz) IBW/kg (Calculated) : 50.1 Heparin Dosing Weight: 83.7 kg  Vital Signs: Temp: 98.7 F (37.1 C) (06/02 0435) Temp Source: Oral (06/02 0435) BP: 154/62 (06/02 0435) Pulse Rate: 73 (06/02 0435)  Labs: Recent Labs    04/29/23 1507 04/29/23 1715 04/29/23 1917 04/29/23 2200 04/30/23 0511 04/30/23 1300 04/30/23 1437 04/30/23 2044 05/01/23 0401  HGB 10.9*  --   --   --  10.4*  --   --   --  9.8*  HCT 37.2  --   --   --  35.0*  --   --   --  32.0*  PLT 200  --   --   --  218  --   --   --  180  APTT  --    < > 30  --  41* 65*  --  65* 114*  HEPARINUNFRC  --   --  >1.10*  --   --   --   --   --  >1.10*  CREATININE 1.04*  --   --   --   --  1.09*  --   --   --   TROPONINIHS 40*   < > 1,150* 1,085*  --  1,678* 1,669*  --   --    < > = values in this interval not displayed.     Estimated Creatinine Clearance: 64.1 mL/min (A) (by C-G formula based on SCr of 1.09 mg/dL (H)).   Medical History: Past Medical History:  Diagnosis Date   Calculus of kidney    Diabetes mellitus without complication (HCC)    DVT (deep venous thrombosis) (HCC)    a. on Eliquis   Family history of early CAD    a. parents passing in their 42's from CAD   Gout, unspecified    Heart murmur    Hyperlipidemia    Hypertension    Morbid obesity (HCC)    Normal cardiac stress test    a. equivocal study, sig soft tissue artifact present, no chest discomfort or ECG changes, perfusion images suggest mod sized region of mild reversible perfusion defect. Findings may be 2/2 shifting soft tissue attenuation, but cannot r/o ischemia, EF 72%   Phlebitis and  thrombophlebitis of other deep vessels of lower extremities    Phlebitis and thrombophlebitis of other deep vessels of lower extremities    Pulmonary emboli (HCC)    a. on Eliquis   Spinal stenosis, unspecified region other than cervical    Tobacco abuse    Type II or unspecified type diabetes mellitus without mention of complication, uncontrolled    Unspecified sleep apnea    Urinary tract infection, site not specified     Medications:  Medications Prior to Admission  Medication Sig Dispense Refill Last Dose   atorvastatin (LIPITOR) 40 MG tablet Take by mouth.   04/29/2023   azaTHIOprine (IMURAN) 50 MG tablet Take 100 mg by mouth 2 (two) times daily.   04/29/2023   Cholecalciferol (VITAMIN D) 1000 UNITS capsule Take 2,000 Units by mouth daily.   04/29/2023   ELIQUIS 5 MG TABS tablet TAKE 1 TABLET(5 MG)  BY MOUTH TWICE DAILY 60 tablet 0 04/29/2023 at 0900   famotidine (PEPCID) 20 MG tablet Take by mouth.   04/29/2023   FIASP 100 UNIT/ML SOLN Inject into the skin.   Past Month   fluticasone (FLONASE) 50 MCG/ACT nasal spray Place 2 sprays into both nostrils daily as needed for allergies or rhinitis. 16 g 12    furosemide (LASIX) 40 MG tablet Take 1 tablet (40 mg total) by mouth daily. 30 tablet 11 04/29/2023   Insulin Aspart (NOVOLOG FLEXPEN [place]) Inject 25 Units into the skin 2 (two) times daily.    Past Month   Insulin Degludec (TRESIBA FLEXTOUCH [place]) Inject 150 Units into the skin daily.   04/29/2023 at 1100   Insulin Glargine (BASAGLAR KWIKPEN) 100 UNIT/ML Inject 40 Units into the skin at bedtime.   04/28/2023   JARDIANCE 10 MG TABS tablet Take 10 mg by mouth daily.   04/29/2023   loratadine (CLARITIN) 10 MG tablet Take 1 tablet (10 mg total) by mouth daily as needed for allergies. 90 tablet 3 04/29/2023   LORazepam (ATIVAN) 0.5 MG tablet Take 0.5 mg by mouth once.   04/29/2023   Multiple Vitamin (MULTIVITAMIN) tablet Take 1 tablet by mouth daily.   04/29/2023   pregabalin (LYRICA) 150 MG capsule Take  150 mg by mouth in the morning, at noon, and at bedtime.   04/29/2023   spironolactone (ALDACTONE) 25 MG tablet Take 25 mg by mouth daily.   04/29/2023   TRELEGY ELLIPTA 100-62.5-25 MCG/ACT AEPB Inhale into the lungs.   04/29/2023   albuterol (VENTOLIN HFA) 108 (90 Base) MCG/ACT inhaler Inhale into the lungs. (Patient not taking: Reported on 04/29/2023)   Not Taking   amLODipine (NORVASC) 5 MG tablet Take 1 tablet (5 mg total) by mouth daily. (Patient not taking: Reported on 04/29/2023) 90 tablet 3 Not Taking   diclofenac sodium (VOLTAREN) 1 % GEL APP 2 GRAMS EXT AA TID (Patient not taking: Reported on 04/29/2023)   Not Taking   esomeprazole (NEXIUM) 40 MG capsule Take 40 mg by mouth daily before breakfast. (Patient not taking: Reported on 04/29/2023)   Not Taking   fenofibrate 54 MG tablet Take 54 mg by mouth daily. (Patient not taking: Reported on 04/29/2023)   Not Taking   ipratropium (ATROVENT) 0.06 % nasal spray Place 2 sprays into both nostrils 3 (three) times daily. (Patient not taking: Reported on 04/29/2023) 15 mL 12 Not Taking   JANUMET 50-500 MG tablet TK 1 T PO BID  1    linaclotide (LINZESS) 72 MCG capsule Take 1 capsule (72 mcg total) by mouth daily before breakfast. (Patient not taking: Reported on 04/29/2023) 30 capsule 0 Not Taking   methocarbamol (ROBAXIN) 750 MG tablet Take by mouth. (Patient not taking: Reported on 04/29/2023)   Not Taking   metoprolol succinate (TOPROL-XL) 50 MG 24 hr tablet Take 1 tablet (50 mg total) by mouth daily. Take with or immediately following a meal. (Patient not taking: Reported on 04/29/2023) 90 tablet 3 Not Taking   mometasone (ELOCON) 0.1 % lotion APPLY AA BID FOR 2 WEEKS (Patient not taking: Reported on 04/29/2023)   Not Taking   OZEMPIC 1 MG/DOSE SOPN INJECT 1 MG SUBCUNTANEOUSLY ONCE A WEEK  0    potassium chloride (K-DUR) 10 MEQ tablet Take 1 tablet (10 mEq total) by mouth daily as needed (Daily with Lasix (Furosemide)). (Patient not taking: Reported on  04/29/2023) 30 tablet 11 Not Taking   rosuvastatin (CRESTOR) 20 MG tablet Take  1 tablet (20 mg total) by mouth daily. (Patient not taking: Reported on 04/29/2023) 30 tablet 11 Not Taking   sodium chloride (OCEAN) 0.65 % SOLN nasal spray Place 2 sprays into both nostrils daily as needed for congestion. 1st before flonase and atrovent (Patient not taking: Reported on 04/29/2023) 1 Bottle 12 Not Taking   telmisartan-hydrochlorothiazide (MICARDIS HCT) 80-12.5 MG tablet Take 1 tablet by mouth daily. (Patient not taking: Reported on 04/29/2023) 30 tablet 11 Not Taking   Scheduled:   azaTHIOprine  100 mg Oral BID   fluticasone furoate-vilanterol  1 puff Inhalation Daily   And   umeclidinium bromide  1 puff Inhalation Daily   furosemide  40 mg Oral Daily   insulin aspart  0-20 Units Subcutaneous TID WC   insulin aspart  0-5 Units Subcutaneous QHS   insulin glargine-yfgn  40 Units Subcutaneous QHS   methylPREDNISolone (SOLU-MEDROL) injection  40 mg Intravenous Q12H   metoprolol succinate  25 mg Oral Daily   multivitamin with minerals  1 tablet Oral Daily   pantoprazole  40 mg Oral QAC breakfast   rosuvastatin  20 mg Oral Daily   spironolactone  25 mg Oral Daily   Infusions:   heparin 1,650 Units/hr (04/30/23 2229)    Assessment: 67 yo F to start heparin drip for ACS/STEMI.  Patient on apixaban PTA for hx DVT/PE. Patient from nursing facility, bedbound.  Currently with AMS.  Last apixaban given 0900 5/31 Baseline:  hgb 10.9  plt 200   aPTT 30  Heparin level >1.10  (on apixaban)   Goal of Therapy:  Heparin level 0.3-0.7 units/ml aPTT 66-102 seconds Monitor platelets by anticoagulation protocol: Yes   06/01 0511 aPTT 41, subtherapeutic 06/01 1300 aPTT 65. Barely subtherapeutic  1350 > 1500 u/hr 03/04 2045 aPTT 65, Barely subtherapeutic  1500 > 1650 06/02 0401 aPTT 114,supratherapeutic / HL >1.10  Plan:  Decrease heparin drip to 1550 units/hr Will recheck aPTT in 6 hours Will check  Heparin level at least once daily and when HL and aPTT are correlating will switch to following HL. CBC daily  Otelia Sergeant, PharmD, [facility] 05/01/2023 5:20 AM

## 2023-05-01 NOTE — Plan of Care (Signed)
  Problem: Skin Integrity: Goal: Risk for impaired skin integrity will decrease Outcome: Progressing   Problem: Clinical Measurements: Goal: Respiratory complications will improve Outcome: Progressing Goal: Cardiovascular complication will be avoided Outcome: Progressing   Problem: Safety: Goal: Ability to remain free from injury will improve Outcome: Progressing

## 2023-05-01 NOTE — Consult Note (Signed)
Cardiology Consultation   Patient ID: Sonya Sanchez MRN: [id number]; DOB: [birth year]  Admit date: 04/29/2023 Date of Consult: 05/01/2023  PCP:  Oneita Hurt, No   [facility] Providers Cardiologist:  Dr. Mariah Milling   Patient Profile:   Sonya Sanchez is a 67 y.o. 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 Clide Dales.  History of Present Illness:   Sonya Sanchez been seen by Dr. Mariah Milling in the past.  Patient was diagnosed with DVT/PE in 2006 in [place] and started on warfarin.  She was eventually transition to Eliquis by a physician in [place].  She had an ultrasound in 2010 for possible cellulitis, no DVT at that time.  Echo in 2014 showed EF 55 to 60%, grade 1 diastolic dysfunction, mild AS.  Myoview Lexiscan in August 2016 in [place].  Results indicate equivocal study, significant soft tissue artifact present no chest discomfort or EKG changes, perfusion images suggest moderate size region of mild reversible perfusion defect.  Findings secondary to shifting soft tissue attenuation, but cannot rule out ischemia, EF 72%.  Patient was hospitalized in 2016 with urosepsis, nonketotic hyperglycemic event, elevated troponin, and acute heart failure.  Etiology saw for elevated troponin.  Echo showed normal pump function.  Was treated with IV Lasix.  VQ was negative, lower extremity ultrasound was negative.  Patient was last seen in 2020 and was stable from a cardiac perspective.  Patient was subsequently lost to follow-up.  She reported she moved back to [place] and was there the last 3 years. She moved back to [facility] in January 2024 to be closer to there  sister. She is a nursing home and has not established with cardiology yet.   The patient presented to the ER at [facility] for respiratory distress. She reports shortness of breath that started 3 days ago and did not get better despite outpatient treatment. She denied chest pain. She reports chronic LLE. Patient was found to be 60% on 2 L nasal cannula at her facility.  She was placed on CPAP and given 2 nebulizers.  In the ER blood pressure was 153/50, pulse 93, respiratory rate 14.  Labs showed WBC 11.4, hemoglobin 10.9, blood glucose 338, serum creatinine 1.04, BUN 49, albumin 3.4.  BNP 386. .  High-sensitivity troponin 40.  Chest x-ray showed vascular congestion with trace edema, small left effusion. EKG showed ST with no significant ischemic changes. CT of the head was unremarkable.  She was started on Lasix, DuoNebs, and IV heparin and admitted.  Past Medical History:  Diagnosis Date   Calculus of kidney    Diabetes mellitus without complication (HCC)    DVT (deep venous thrombosis) (HCC)    a. on Eliquis   Family history of early CAD    a. parents passing in their 68's from CAD   Gout, unspecified    Heart murmur    Hyperlipidemia    Hypertension    Morbid obesity (HCC)    Normal cardiac stress test    a. equivocal study, sig soft tissue artifact present, no chest discomfort or ECG changes, perfusion images suggest mod sized region of mild reversible perfusion defect. Findings may be 2/2 shifting  soft tissue attenuation, but cannot r/o ischemia, EF 72%   Phlebitis and thrombophlebitis of other deep vessels of lower extremities    Phlebitis and thrombophlebitis of other deep vessels of lower extremities    Pulmonary emboli (HCC)    a. on Eliquis   Spinal stenosis, unspecified region other than cervical    Tobacco abuse    Type II or unspecified type diabetes mellitus without mention of complication, uncontrolled    Unspecified sleep apnea    Urinary tract infection, site not specified      Past Surgical History:  Procedure Laterality Date   ABDOMINAL HYSTERECTOMY     hyperplasia of endometrium   ANKLE SURGERY     fracture s/p pin, right ankle   APPENDECTOMY     CHOLECYSTECTOMY     COLECTOMY     temporary colostomy, now reversed   INTESTINAL BYPASS     ovarian cyst ruptured, led to perforated intestine   STOMACH SURGERY       Home Medications:  Prior to Admission medications   Medication Sig Start Date End Date Taking? Authorizing Provider  atorvastatin (LIPITOR) 40 MG tablet Take by mouth.   Yes [provider]  azaTHIOprine (IMURAN) 50 MG tablet Take 100 mg by mouth 2 (two) times daily.   Yes [provider]  Cholecalciferol (VITAMIN D) 1000 UNITS capsule Take 2,000 Units by mouth daily.   Yes [provider]  ELIQUIS 5 MG TABS tablet TAKE 1 TABLET(5 MG) BY MOUTH TWICE DAILY 03/29/18  Yes Gollan, Tollie Pizza, MD  famotidine (PEPCID) 20 MG tablet Take by mouth.   Yes [provider]  FIASP 100 UNIT/ML SOLN Inject into the skin. 04/25/23  Yes [provider]  fluticasone (FLONASE) 50 MCG/ACT nasal spray Place 2 sprays into both nostrils daily as needed for allergies or rhinitis. 02/21/19  Yes McLean-Scocuzza, Pasty Spillers, MD  furosemide (LASIX) 40 MG tablet Take 1 tablet (40 mg total) by mouth daily. 01/09/18  Yes Gollan, Tollie Pizza, MD  Insulin Aspart (NOVOLOG FLEXPEN [place]) Inject 25 Units into the skin 2 (two) times daily.    Yes [provider]  Insulin Degludec (TRESIBA FLEXTOUCH [place]) Inject 150 Units into the skin daily.   Yes [provider]  Insulin Glargine (BASAGLAR KWIKPEN) 100 UNIT/ML Inject 40 Units into the skin at bedtime. 11/15/22  Yes [provider]  JARDIANCE 10 MG TABS tablet Take 10 mg by mouth daily.   Yes [provider]  loratadine (CLARITIN) 10 MG tablet Take 1 tablet (10 mg total) by mouth daily as needed for allergies. 02/21/19  Yes McLean-Scocuzza, Pasty Spillers, MD  LORazepam  (ATIVAN) 0.5 MG tablet Take 0.5 mg by mouth once.   Yes [provider]  Multiple Vitamin (MULTIVITAMIN) tablet Take 1 tablet by mouth daily.   Yes [provider]  pregabalin (LYRICA) 150 MG capsule Take 150 mg by mouth in the morning, at noon, and at bedtime.   Yes [provider]  spironolactone (ALDACTONE) 25 MG tablet Take 25 mg by mouth daily.   Yes [provider]  Dwyane Luo 100-62.5-25 MCG/ACT AEPB Inhale into the lungs.   Yes [provider]  albuterol (VENTOLIN HFA) 108 (90 Base) MCG/ACT inhaler Inhale into the lungs. Patient not taking: Reported on 04/29/2023    [provider]  amLODipine (NORVASC) 5 MG tablet Take 1 tablet (5 mg total) by mouth daily. Patient not taking: Reported on 04/29/2023 05/30/19   McLean-Scocuzza, Pasty Spillers, MD  diclofenac sodium (VOLTAREN) 1 % GEL APP 2 GRAMS EXT AA TID Patient not taking: Reported on 04/29/2023 02/08/19   [provider]  esomeprazole (NEXIUM) 40 MG capsule Take 40 mg by mouth daily before breakfast. Patient not taking: Reported on 04/29/2023    [provider]  fenofibrate 54 MG tablet Take 54 mg by mouth daily. Patient not taking: Reported on 04/29/2023    [provider]  ipratropium (ATROVENT) 0.06 % nasal spray Place 2 sprays into both nostrils 3 (three) times daily. Patient not taking: Reported on 04/29/2023 02/21/19   McLean-Scocuzza, Pasty Spillers, MD  JANUMET 50-500 MG tablet TK 1 T PO BID 09/20/16   [provider]  linaclotide (LINZESS) 72 MCG capsule Take 1 capsule (72 mcg total) by mouth daily before breakfast. Patient not taking: Reported on 04/29/2023 05/11/19   McLean-Scocuzza, Pasty Spillers, MD  methocarbamol (ROBAXIN) 750 MG tablet Take by mouth. Patient not taking: Reported on 04/29/2023    [provider]  metoprolol succinate (TOPROL-XL) 50 MG 24 hr tablet Take 1 tablet (50 mg total) by mouth daily. Take with or immediately following a  meal. Patient not taking: Reported on 04/29/2023 05/30/19   McLean-Scocuzza, Pasty Spillers, MD  mometasone (ELOCON) 0.1 % lotion APPLY AA BID FOR 2 WEEKS Patient not taking: Reported on 04/29/2023 11/03/18   [provider]  OZEMPIC 1 MG/DOSE SOPN INJECT 1 MG SUBCUNTANEOUSLY ONCE A WEEK 12/08/17   [provider]  potassium chloride (K-DUR) 10 MEQ tablet Take 1 tablet (10 mEq total) by mouth daily as needed (Daily with Lasix (Furosemide)). Patient not taking: Reported on 04/29/2023 01/09/18   Antonieta Iba, MD  rosuvastatin (CRESTOR) 20 MG tablet Take 1 tablet (20 mg total) by mouth daily. Patient not taking: Reported on 04/29/2023 01/09/18   Antonieta Iba, MD  sodium chloride (OCEAN) 0.65 % SOLN nasal spray Place 2 sprays into both nostrils daily as needed for congestion. 1st before flonase and atrovent Patient not taking: Reported on 04/29/2023 02/21/19   McLean-Scocuzza, Pasty Spillers, MD  telmisartan-hydrochlorothiazide (MICARDIS HCT) 80-12.5 MG tablet Take 1 tablet by mouth daily. Patient not taking: Reported on 04/29/2023 01/09/18   Antonieta Iba, MD    Inpatient Medications: Scheduled Meds:  azaTHIOprine  100 mg Oral BID   fluticasone furoate-vilanterol  1 puff Inhalation Daily   And   umeclidinium bromide  1 puff Inhalation Daily   furosemide  40 mg Oral Daily   insulin aspart  0-20 Units Subcutaneous TID WC   insulin aspart  0-5 Units Subcutaneous QHS   insulin glargine-yfgn  40 Units Subcutaneous QHS   methylPREDNISolone (SOLU-MEDROL) injection  40 mg Intravenous Q12H   metoprolol succinate  25 mg Oral Daily   multivitamin with minerals  1 tablet Oral Daily   pantoprazole  40 mg Oral QAC breakfast   rosuvastatin  20 mg Oral Daily   spironolactone  25 mg Oral Daily   Continuous Infusions:  heparin 1,550 Units/hr (05/01/23 0527)   PRN Meds: acetaminophen **OR** acetaminophen, ipratropium-albuterol, ondansetron **OR** ondansetron (ZOFRAN) IV,  senna-docusate  Allergies:    Allergies  Allergen Reactions   Sulfa Antibiotics Other (See Comments)    Reaction: isn't certain, thinks she ran a fever, or had a rash, maybe both.    Social History:   Social History   Socioeconomic History   Marital status: Single    Spouse name: Not on file   Number of children: Not on file   Years of education:  Not on file   Highest education level: Not on file  Occupational History   Not on file  Tobacco Use   Smoking status: Former    Packs/day: 1.00    Years: 10.00    Additional pack years: 0.00    Total pack years: 10.00    Types: Cigarettes    Quit date: 12/26/1999    Years since quitting: 23.3   Smokeless tobacco: Never  Substance and Sexual Activity   Alcohol use: Yes    Comment: socially   Drug use: No   Sexual activity: Not Currently  Other Topics Concern   Not on file  Social History Narrative   Lives in [place], [place] summer (near the [place]), [place] Dec-Mar.  Sister and children live here in [place]      Work - Retired Designer, television/film set      Diet - regular      Exercise - walks, limited by knee and ankle pain   Social Determinants of Corporate investment banker Strain: Not on file  Food Insecurity: No Food Insecurity (04/30/2023)   Hunger Vital Sign    Worried About Running Out of Food in the Last Year: Never true    Ran Out of Food in the Last Year: Never true  Transportation Needs: No Transportation Needs (04/30/2023)   PRAPARE - Administrator, Civil Service (Medical): No    Lack of Transportation (Non-Medical): No  Physical Activity: Not on file  Stress: Not on file  Social Connections: Not on file  Intimate Partner Violence: Not At Risk (04/30/2023)   Humiliation, Afraid, Rape, and Kick questionnaire    Fear of Current or Ex-Partner: No    Emotionally Abused: No    Physically Abused: No    Sexually Abused: No    Family History:    Family History  Problem Relation Age of Onset   Heart attack  Mother 78   Cancer Mother        breast   Heart attack Father 37   Arthritis Sister      ROS:  Please see the history of present illness.   All other ROS reviewed and negative.     Physical Exam/Data:   Vitals:   04/30/23 2102 04/30/23 2235 05/01/23 0435 05/01/23 0808  BP: 132/62  (!) 154/62 (!) 148/55  Pulse: 73  73 69  Resp: 17  18 18   Temp: 98.3 F (36.8 C)  98.7 F (37.1 C) 98 F (36.7 C)  TempSrc: Oral  Oral   SpO2: 96% 94% 95% 95%  Weight:      Height:        Intake/Output Summary (Last 24 hours) at 05/01/2023 0909 Last data filed at 05/01/2023 0500 Gross per 24 hour  Intake 723.74 ml  Output 1150 ml  Net -426.26 ml      04/29/2023    3:03 PM 01/27/2018   10:46 AM 01/19/2018    4:19 PM  Last 3 Weights  Weight (lbs) 293 lb 3.4 oz 300 lb 3.2 oz 302 lb 9.6 oz  Weight (kg) 133 kg 136.17 kg 137.258 kg     Body mass index is 53.63 kg/m.  General:  Well nourished, well developed, in no acute distress HEENT: normal Neck: no JVD Vascular: No carotid bruits; Distal pulses 2+ bilaterally Cardiac:  normal S1, S2; RRR; +murmur  Lungs:  clear to auscultation bilaterally, no wheezing, rhonchi or rales  Abd: soft, nontender, no hepatomegaly  Ext: 1+ lower leg edema  Musculoskeletal:  No deformities, BUE and BLE strength normal and equal Skin: warm and dry  Neuro:  CNs 2-12 intact, no focal abnormalities noted Psych:  Normal affect   EKG:  The EKG was personally reviewed and demonstrates:  ST 105bpm, Pvc, LAE, no significant ST/T wave changes Telemetry:  Telemetry was personally reviewed and demonstrates:  NSR frequent PVCs, HR 70-80s  Relevant CV Studies:  Echo 2016 Study Conclusions   - Left ventricle: The cavity size was normal. Systolic function was    normal. The estimated ejection fraction was in the range of 60%    to 65%. Images were inadequate for LV wall motion assessment.    Doppler parameters are consistent with abnormal left ventricular    relaxation  (grade 1 diastolic dysfunction).  - Ventricular septum: Not well-visualized, but it looked like the    IV septum was D-shaped, suggesting RV pressure/volume overload.  - Aortic valve: Poorly visualized. There was no stenosis.  - Mitral valve: Mildly calcified annulus. Mildly calcified leaflets    .  - Left atrium: The atrium was moderately dilated.  - Right ventricle: The cavity size was mildly dilated. Systolic    function was mildly reduced.  - Pulmonary arteries: No complete TR doppler jet so unable to    estimate PA systolic pressure.  - Systemic veins: IVC measured 2.5 cm with < 50% respirophasic    variation, suggesting RA pressure 15 mmHg.   Impressions:   - Extremely poor images. LV systolic function appears normal, EF    60-65%. The RV was poorly visualized but appeared mildly dilated    and mildly hypokinetic with some evidence for RV pressure/volume    overload.   Laboratory Data:  High Sensitivity Troponin:   Recent Labs  Lab 04/29/23 1917 04/29/23 2200 04/30/23 1300 04/30/23 1437 05/01/23 0401  TROPONINIHS 1,150* 1,085* 1,678* 1,669* 958*     Chemistry Recent Labs  Lab 04/29/23 1507 04/30/23 1300 05/01/23 0401  NA 143 141 139  K 4.5 5.1 5.2*  CL 98 100 99  CO2 28 28 28   GLUCOSE 338* 225* 322*  BUN 49* 63* 68*  CREATININE 1.04* 1.09* 1.14*  CALCIUM 9.1 9.2 9.1  GFRNONAA 58* 55* 52*  ANIONGAP 17* 13 12    Recent Labs  Lab 04/29/23 1507  PROT 7.2  ALBUMIN 3.4*  AST 15  ALT 8  ALKPHOS 56  BILITOT 0.8   Lipids No results for input(s): "CHOL", "TRIG", "HDL", "LABVLDL", "LDLCALC", "CHOLHDL" in the last 168 hours.  Hematology Recent Labs  Lab 04/29/23 1507 04/30/23 0511 05/01/23 0401  WBC 11.4* 5.7 7.9  RBC 3.76* 3.54* 3.41*  HGB 10.9* 10.4* 9.8*  HCT 37.2 35.0* 32.0*  MCV 98.9 98.9 93.8  MCH 29.0 29.4 28.7  MCHC 29.3* 29.7* 30.6  RDW 18.1* 18.1* 18.0*  PLT 200 218 180   Thyroid No results for input(s): "TSH", "FREET4" in the last 168  hours.  BNP Recent Labs  Lab 04/29/23 1507  BNP 386.8*    DDimer No results for input(s): "DDIMER" in the last 168 hours.   Radiology/Studies:  CT HEAD WO CONTRAST ( )  Result Date: 04/29/2023 CLINICAL DATA:  Mental status change, unknown cause EXAM: CT HEAD WITHOUT CONTRAST TECHNIQUE: Contiguous axial images were obtained from the base of the skull through the vertex without intravenous contrast. RADIATION DOSE REDUCTION: This exam was performed according to the departmental dose-optimization program which includes automated exposure control, adjustment of the mA and/or kV according to patient size  and/or use of iterative reconstruction technique. COMPARISON:  None Available. FINDINGS: Brain: No intracranial hemorrhage, mass effect, or midline shift. No hydrocephalus. The basilar cisterns are patent. No evidence of territorial infarct or acute ischemia. No extra-axial or intracranial fluid collection. Vascular: Atherosclerosis of skullbase vasculature without hyperdense vessel or abnormal calcification. Skull: No fracture or focal lesion. Sinuses/Orbits: Small fluid level in right side of sphenoid sinus. Bilateral cataract resection. No mastoid effusion. Other: None. IMPRESSION: 1. No acute intracranial abnormality. 2. Small fluid level in the right side of sphenoid sinus, may be due to acute sinusitis. Electronically Signed   By: Narda Rutherford M.D.   On: 04/29/2023 19:15   DG Chest Portable 1 View  Result Date: 04/29/2023 CLINICAL DATA:  Shortness of breath EXAM: PORTABLE CHEST 1 VIEW COMPARISON:  X-ray 12/08/2016 FINDINGS: Underinflation. Mildly enlarged cardiopericardial silhouette with calcified aorta and evidence of vascular congestion with trace edema. Small left effusion as well. Thickening along the minor fissure. No pneumothorax. Overlapping cardiac leads. Calcified aorta. IMPRESSION: Enlarged heart with vascular congestion and trace edema. Small left effusion. Electronically Signed    By: Karen Kays M.D.   On: 04/29/2023 15:41     Assessment and Plan:   Elevated trop -Elevated troponin in the setting of acute respiratory failure, CPAP noncompliance, metabolic derangements -High-sensitivity troponin elevated to 1669, now downtrending -Started on IV heparin -Echo ordered -No prior CAD history - no chest pain reported -Continue IV heparin - continue Crestor 20mg  daily and Toprol 25mg  daily. PTA Eliquis -Patient is not very functional at baseline however may ultimately end up needing a cath once pulmonary issues resolve.  Acute on chronic hypoxic and hypercapnic respiratory failure -Likely multifactorial in the setting of CHF, CPAP noncompliance, obesity -She was weaned off BiPAP, on CPAP at night. -Lasix 40mg  daily -Steroids per IM  Acute on chronic diastolic heart failure - BNP 386 - IV lasix>lasix 40mg  daily - kidney function stable.  - 1+ LLE, may need additional IV lasix, but Scr/BUN trending up - echo pending  History of DVT -Eliquis held for IV heparin  AMS -CT of the head negative  Bedbound -wheelchair-bound -PT/OT eval  OSA - Continue CPAP    For questions or updates, please contact [facility] Please consult [email or web address] for contact info under    Signed, Jontavious Commons David Stall, PA-C  05/01/2023 9:09 AM

## 2023-05-01 NOTE — Progress Notes (Signed)
Progress Note   Patient: Sonya Sanchez:[id number] DOB: Mar 25, 1956 DOA: 04/29/2023     2 DOS: the patient was seen and examined on 05/01/2023   Brief hospital course: Ms.Sonya Sanchez is a 67 year old female with history of insulin-dependent diabetes mellitus, GERD, hypertension, hyperlipidemia, morbid obesity, OSA on CPAP, history of DVT, PE on anticoagulation, chronic respiratory failure on 2 L nasal cannula at baseline, dermatomyositis, baseline bedbound (has not walked for 3 years) prior smoking history, presented to emergency department for chief concerns of respiratory distress.  Patient is not wearing CPAP at the facility.  She was more lethargic upon presentation, troponins elevated, ABG showed high CO2, low oxygen.  Patient was on BiPAP in the ED and gradually weaned to 2 L supplemental oxygen.  She is started on Lasix, DuoNebs, heparin drip admitted to stepdown unit.  Assessment and Plan: * Acute on chronic hypoxic and hypercapnic respiratory failure (HCC) Possibly multifactorial in the setting of CHF exacerbation, noncompliance with CPAP, sedatives use. Off Bipap, continue CPAP at night. Continue oral Lasix home dose. Strict I's and O's. Solu-Medrol 40 mg changed to oral prednisone from tomorrow. Continue DuoNebs, supplemental oxygen to maintain saturation greater than 90%. Encourage incentive spirometry.  Lethargy/metabolic encephalopathy Due to metabolic derangements including hypercarbia, hypoxia. Bipap weaned off. Continue supplemental oxygen. Avoid sedatives, opiates, benzos. She is more alert and able to answer, at baseline.  Adult onset dermatomyositis (HCC) Continue Azathioprine.  Bedbound Poor functional status. PT/ OT evaluation. Incentive spirometry.  OSA (obstructive sleep apnea) CPAP at night, asks for well fitting mask, RT to eval.  Elevated troponin- Possibly demand ischemia due to acute on chronic hypercapnic, hypoxic respiratory failure. No chest pain.  Trop trending down. Stop heparin and will restart her home dose Eliquis. Echo pending. Cardiology consult appreciated, may need ischemic eval once clinically stable.  H/o DVT - resume Eliquis.  Poorly controlled type 2 diabetes mellitus with circulatory disorder (HCC) Insulin-dependent diabetes mellitus Insulin sliding scale ordered. Sugars may worsen as she is on steroids. Lantus home dose initiated. She has good appetite, eating fair.  Morbid obesity (HCC) This complicates overall care and prognosis.  Diet, exercise and weight reduction advised.  PT/ OT evaluation. Fall, aspiration precautions. DVT porphylaxis - Eliquis    Subjective: Patient is seen and examined today morning.  She is lying in bed.  Shortness of breath much improved.  More alert, awake, able to answer me.  Asks if she can eat.  BiPAP has been weaned off, currently on 2 L supplemental oxygen  Physical Exam: Vitals:   04/30/23 2102 04/30/23 2235 05/01/23 0435 05/01/23 0808  BP: 132/62  (!) 154/62 (!) 148/55  Pulse: 73  73 69  Resp: 17  18 18   Temp: 98.3 F (36.8 C)  98.7 F (37.1 C) 98 F (36.7 C)  TempSrc: Oral  Oral   SpO2: 96% 94% 95% 95%  Weight:      Height:       General-elderly morbidly obese Caucasian female, mild respiratory distress. HEENT-PERRLA EOMI, nontender sinuses. Heart-S1-S2 heard, no murmurs.  Trace pedal edema. Lungs-distant breath sounds.  Diffuse rhonchi noted. Abdomen-soft, nontender, bowel sounds good. Neuro-awake oriented x 3, lower extremity weakness, nonfocal.  Data Reviewed:  CT brain, ABG, troponin, CBC, APTT  Family Communication: Discussed with patient's sister over phone about the care plan, she understands and agrees.  Disposition: Status is: Inpatient Remains inpatient appropriate because: respiratory failure, need   Planned Discharge Destination: Skilled nursing facility    MDM level 3- Patient  admitted with respiratory failure, is on high risk medication  including heparin drip, need close hemodynamic, telemetry, neurologic monitoring. She is at high risk for sudden clinical deterioration.  Author: Marcelino Duster, MD 05/01/2023 1:17 PM  For on call review [email or web address].

## 2023-05-01 NOTE — Progress Notes (Signed)
ANTICOAGULATION CONSULT NOTE -   Pharmacy Consult for Heparin drip Indication: chest pain/ACS, on apixaban PTA for hx DVT/PE  Allergies  Allergen Reactions   Sulfa Antibiotics Other (See Comments)    Reaction: isn't certain, thinks she ran a fever, or had a rash, maybe both.    Patient Measurements: Height: 5\' 2"  (157.5 cm) Weight: 133 kg (293 lb 3.4 oz) IBW/kg (Calculated) : 50.1 Heparin Dosing Weight: 83.7 kg  Vital Signs: Temp: 98 F (36.7 C) (06/02 0808) Temp Source: Oral (06/02 0435) BP: 148/55 (06/02 0808) Pulse Rate: 69 (06/02 0808)  Labs: Recent Labs    04/29/23 1507 04/29/23 1715 04/29/23 1917 04/29/23 2200 04/30/23 0511 04/30/23 1300 04/30/23 1437 04/30/23 2044 05/01/23 0401 05/01/23 0908 05/01/23 1221  HGB 10.9*  --   --   --  10.4*  --   --   --  9.8*  --   --   HCT 37.2  --   --   --  35.0*  --   --   --  32.0*  --   --   PLT 200  --   --   --  218  --   --   --  180  --   --   APTT  --    < > 30  --  41* 65*  --  65* 114*  --  73*  HEPARINUNFRC  --   --  >1.10*  --   --   --   --   --  >1.10*  --   --   CREATININE 1.04*  --   --   --   --  1.09*  --   --  1.14*  --   --   TROPONINIHS 40*   < > 1,150*   < >  --  1,678*   < >  --  958* 904* 859*   < > = values in this interval not displayed.     Estimated Creatinine Clearance: 61.2 mL/min (A) (by C-G formula based on SCr of 1.14 mg/dL (H)).   Medical History: Past Medical History:  Diagnosis Date   Calculus of kidney    Diabetes mellitus without complication (HCC)    DVT (deep venous thrombosis) (HCC)    a. on Eliquis   Family history of early CAD    a. parents passing in their 43's from CAD   Gout, unspecified    Heart murmur    Hyperlipidemia    Hypertension    Morbid obesity (HCC)    Normal cardiac stress test    a. equivocal study, sig soft tissue artifact present, no chest discomfort or ECG changes, perfusion images suggest mod sized region of mild reversible perfusion defect.  Findings may be 2/2 shifting soft tissue attenuation, but cannot r/o ischemia, EF 72%   Phlebitis and thrombophlebitis of other deep vessels of lower extremities    Phlebitis and thrombophlebitis of other deep vessels of lower extremities    Pulmonary emboli (HCC)    a. on Eliquis   Spinal stenosis, unspecified region other than cervical    Tobacco abuse    Type II or unspecified type diabetes mellitus without mention of complication, uncontrolled    Unspecified sleep apnea    Urinary tract infection, site not specified     Medications:  Medications Prior to Admission  Medication Sig Dispense Refill Last Dose   atorvastatin (LIPITOR) 40 MG tablet Take by mouth.   04/29/2023   azaTHIOprine (IMURAN) 50  MG tablet Take 100 mg by mouth 2 (two) times daily.   04/29/2023   Cholecalciferol (VITAMIN D) 1000 UNITS capsule Take 2,000 Units by mouth daily.   04/29/2023   ELIQUIS 5 MG TABS tablet TAKE 1 TABLET(5 MG) BY MOUTH TWICE DAILY 60 tablet 0 04/29/2023 at 0900   famotidine (PEPCID) 20 MG tablet Take by mouth.   04/29/2023   FIASP 100 UNIT/ML SOLN Inject into the skin.   Past Month   fluticasone (FLONASE) 50 MCG/ACT nasal spray Place 2 sprays into both nostrils daily as needed for allergies or rhinitis. 16 g 12    furosemide (LASIX) 40 MG tablet Take 1 tablet (40 mg total) by mouth daily. 30 tablet 11 04/29/2023   Insulin Aspart (NOVOLOG FLEXPEN [place]) Inject 25 Units into the skin 2 (two) times daily.    Past Month   Insulin Degludec (TRESIBA FLEXTOUCH [place]) Inject 150 Units into the skin daily.   04/29/2023 at 1100   Insulin Glargine (BASAGLAR KWIKPEN) 100 UNIT/ML Inject 40 Units into the skin at bedtime.   04/28/2023   JARDIANCE 10 MG TABS tablet Take 10 mg by mouth daily.   04/29/2023   loratadine (CLARITIN) 10 MG tablet Take 1 tablet (10 mg total) by mouth daily as needed for allergies. 90 tablet 3 04/29/2023   LORazepam (ATIVAN) 0.5 MG tablet Take 0.5 mg by mouth once.   04/29/2023   Multiple Vitamin  (MULTIVITAMIN) tablet Take 1 tablet by mouth daily.   04/29/2023   pregabalin (LYRICA) 150 MG capsule Take 150 mg by mouth in the morning, at noon, and at bedtime.   04/29/2023   spironolactone (ALDACTONE) 25 MG tablet Take 25 mg by mouth daily.   04/29/2023   TRELEGY ELLIPTA 100-62.5-25 MCG/ACT AEPB Inhale into the lungs.   04/29/2023   albuterol (VENTOLIN HFA) 108 (90 Base) MCG/ACT inhaler Inhale into the lungs. (Patient not taking: Reported on 04/29/2023)   Not Taking   amLODipine (NORVASC) 5 MG tablet Take 1 tablet (5 mg total) by mouth daily. (Patient not taking: Reported on 04/29/2023) 90 tablet 3 Not Taking   diclofenac sodium (VOLTAREN) 1 % GEL APP 2 GRAMS EXT AA TID (Patient not taking: Reported on 04/29/2023)   Not Taking   esomeprazole (NEXIUM) 40 MG capsule Take 40 mg by mouth daily before breakfast. (Patient not taking: Reported on 04/29/2023)   Not Taking   fenofibrate 54 MG tablet Take 54 mg by mouth daily. (Patient not taking: Reported on 04/29/2023)   Not Taking   ipratropium (ATROVENT) 0.06 % nasal spray Place 2 sprays into both nostrils 3 (three) times daily. (Patient not taking: Reported on 04/29/2023) 15 mL 12 Not Taking   JANUMET 50-500 MG tablet TK 1 T PO BID  1    linaclotide (LINZESS) 72 MCG capsule Take 1 capsule (72 mcg total) by mouth daily before breakfast. (Patient not taking: Reported on 04/29/2023) 30 capsule 0 Not Taking   methocarbamol (ROBAXIN) 750 MG tablet Take by mouth. (Patient not taking: Reported on 04/29/2023)   Not Taking   metoprolol succinate (TOPROL-XL) 50 MG 24 hr tablet Take 1 tablet (50 mg total) by mouth daily. Take with or immediately following a meal. (Patient not taking: Reported on 04/29/2023) 90 tablet 3 Not Taking   mometasone (ELOCON) 0.1 % lotion APPLY AA BID FOR 2 WEEKS (Patient not taking: Reported on 04/29/2023)   Not Taking   OZEMPIC 1 MG/DOSE SOPN INJECT 1 MG SUBCUNTANEOUSLY ONCE A WEEK  0  potassium chloride (K-DUR) 10 MEQ tablet Take 1 tablet (10  mEq total) by mouth daily as needed (Daily with Lasix (Furosemide)). (Patient not taking: Reported on 04/29/2023) 30 tablet 11 Not Taking   rosuvastatin (CRESTOR) 20 MG tablet Take 1 tablet (20 mg total) by mouth daily. (Patient not taking: Reported on 04/29/2023) 30 tablet 11 Not Taking   sodium chloride (OCEAN) 0.65 % SOLN nasal spray Place 2 sprays into both nostrils daily as needed for congestion. 1st before flonase and atrovent (Patient not taking: Reported on 04/29/2023) 1 Bottle 12 Not Taking   telmisartan-hydrochlorothiazide (MICARDIS HCT) 80-12.5 MG tablet Take 1 tablet by mouth daily. (Patient not taking: Reported on 04/29/2023) 30 tablet 11 Not Taking   Scheduled:   azaTHIOprine  100 mg Oral BID   fluticasone furoate-vilanterol  1 puff Inhalation Daily   And   umeclidinium bromide  1 puff Inhalation Daily   furosemide  40 mg Oral Daily   insulin aspart  0-20 Units Subcutaneous TID WC   insulin aspart  0-5 Units Subcutaneous QHS   insulin glargine-yfgn  40 Units Subcutaneous QHS   metoprolol succinate  25 mg Oral Daily   multivitamin with minerals  1 tablet Oral Daily   pantoprazole  40 mg Oral QAC breakfast   [START ON 05/02/2023] predniSONE  20 mg Oral Q breakfast   rosuvastatin  20 mg Oral Daily   spironolactone  25 mg Oral Daily   Infusions:   heparin 1,550 Units/hr (05/01/23 0527)    Assessment: 67 yo F to start heparin drip for ACS/STEMI.  Patient on apixaban PTA for hx DVT/PE. Patient from nursing facility, bedbound.  Currently with AMS.  Last apixaban given 0900 5/31 Baseline:  hgb 10.9  plt 200   aPTT 30  Heparin level >1.10  (on apixaban)   Goal of Therapy:  Heparin level 0.3-0.7 units/ml aPTT 66-102 seconds Monitor platelets by anticoagulation protocol: Yes   06/01 0511 aPTT 41, subtherapeutic 06/01 1300 aPTT 65. Barely subtherapeutic  1350 > 1500 u/hr 03/04 2045 aPTT 65, Barely subtherapeutic  1500 > 1650 06/02 0401 aPTT 114,supratherapeutic / HL >1.10 03/02  1221 aPTT 73, Therapeutic x 1  Plan:  Continue heparin drip at 1550 units/hr Will check confirmatory aPTT in 6 hours Will check Heparin level at least once daily and when HL and aPTT are correlating will switch to following HL. CBC daily  Bari Mantis PharmD Clinical Pharmacist 05/01/2023

## 2023-05-02 DIAGNOSIS — I5031 Acute diastolic (congestive) heart failure: Secondary | ICD-10-CM

## 2023-05-02 DIAGNOSIS — I5033 Acute on chronic diastolic (congestive) heart failure: Secondary | ICD-10-CM

## 2023-05-02 DIAGNOSIS — R7989 Other specified abnormal findings of blood chemistry: Secondary | ICD-10-CM | POA: Diagnosis not present

## 2023-05-02 DIAGNOSIS — J9621 Acute and chronic respiratory failure with hypoxia: Secondary | ICD-10-CM | POA: Diagnosis not present

## 2023-05-02 LAB — BASIC METABOLIC PANEL
Anion gap: 11 (ref 5–15)
BUN: 73 mg/dL — ABNORMAL HIGH (ref 8–23)
CO2: 33 mmol/L — ABNORMAL HIGH (ref 22–32)
Calcium: 9.2 mg/dL (ref 8.9–10.3)
Chloride: 95 mmol/L — ABNORMAL LOW (ref 98–111)
Creatinine, Ser: 1.12 mg/dL — ABNORMAL HIGH (ref 0.44–1.00)
GFR, Estimated: 53 mL/min — ABNORMAL LOW (ref >60)
Glucose, Bld: 261 mg/dL — ABNORMAL HIGH (ref 70–99)
Potassium: 4.1 mmol/L (ref 3.5–5.1)
Sodium: 139 mmol/L (ref 135–145)

## 2023-05-02 LAB — CBC
HCT: 34 % — ABNORMAL LOW (ref 36.0–46.0)
Hemoglobin: 10.8 g/dL — ABNORMAL LOW (ref 12.0–15.0)
MCH: 29 pg (ref 26.0–34.0)
MCHC: 31.8 g/dL (ref 30.0–36.0)
MCV: 91.4 fL (ref 80.0–100.0)
Platelets: 207 10*3/uL (ref 150–400)
RBC: 3.72 MIL/uL — ABNORMAL LOW (ref 3.87–5.11)
RDW: 18.5 % — ABNORMAL HIGH (ref 11.5–15.5)
WBC: 7.8 10*3/uL (ref 4.0–10.5)
nRBC: 0.3 % — ABNORMAL HIGH (ref 0.0–0.2)

## 2023-05-02 LAB — GLUCOSE, CAPILLARY
Glucose-Capillary: 211 mg/dL — ABNORMAL HIGH (ref 70–99)
Glucose-Capillary: 276 mg/dL — ABNORMAL HIGH (ref 70–99)
Glucose-Capillary: 269 mg/dL — ABNORMAL HIGH (ref 70–99)
Glucose-Capillary: 289 mg/dL — ABNORMAL HIGH (ref 70–99)

## 2023-05-02 LAB — HEMOGLOBIN A1C
Hgb A1c MFr Bld: 9.1 % — ABNORMAL HIGH (ref 4.8–5.6)
Mean Plasma Glucose: 214 mg/dL

## 2023-05-02 LAB — CULTURE, BLOOD (ROUTINE X 2)

## 2023-05-02 MED ORDER — ASPIRIN 81 MG PO CHEW
81.0000 mg | CHEWABLE_TABLET | Freq: Every day | ORAL | Status: DC
  Administered 2023-05-02 – 2023-05-05 (×3): 81 mg via ORAL
  Filled 2023-05-02 (×3): qty 1

## 2023-05-02 MED ORDER — SODIUM CHLORIDE 0.9% FLUSH
3.0000 mL | Freq: Two times a day (BID) | INTRAVENOUS | Status: DC
  Administered 2023-05-02 – 2023-05-04 (×6): 3 mL via INTRAVENOUS

## 2023-05-02 MED ORDER — INSULIN GLARGINE-YFGN 100 UNIT/ML ~~LOC~~ SOLN
42.0000 [IU] | Freq: Every day | SUBCUTANEOUS | Status: DC
  Administered 2023-05-02 – 2023-05-04 (×3): 42 [IU] via SUBCUTANEOUS
  Filled 2023-05-02 (×5): qty 0.42

## 2023-05-02 MED ORDER — INSULIN ASPART 100 UNIT/ML IJ SOLN
4.0000 [IU] | Freq: Three times a day (TID) | INTRAMUSCULAR | Status: DC
  Administered 2023-05-02 – 2023-05-05 (×6): 4 [IU] via SUBCUTANEOUS
  Filled 2023-05-02 (×6): qty 1

## 2023-05-02 MED ORDER — HEPARIN (PORCINE) 25000 UT/250ML-% IV SOLN
1700.0000 [IU]/h | INTRAVENOUS | Status: DC
  Administered 2023-05-02: 1550 [IU]/h via INTRAVENOUS
  Administered 2023-05-03 – 2023-05-04 (×2): 1700 [IU]/h via INTRAVENOUS
  Filled 2023-05-02 (×3): qty 250

## 2023-05-02 NOTE — Progress Notes (Signed)
Cardiology Progress Note   Patient Name: Sonya Sanchez Date of Encounter: 05/02/2023  Primary Cardiologist: Julien Nordmann, MD  Subjective   No chest pain, now or prior to admission.  Breathing improving, though not quite back to baseline.  O2 currently @ 3lpm, which is what she uses as outpt.  Inpatient Medications    Scheduled Meds:  apixaban  5 mg Oral BID   azaTHIOprine  100 mg Oral BID   fluticasone furoate-vilanterol  1 puff Inhalation Daily   And   umeclidinium bromide  1 puff Inhalation Daily   furosemide  40 mg Intravenous BID   insulin aspart  0-20 Units Subcutaneous TID WC   insulin aspart  0-5 Units Subcutaneous QHS   insulin glargine-yfgn  40 Units Subcutaneous QHS   metoprolol succinate  25 mg Oral Daily   multivitamin with minerals  1 tablet Oral Daily   pantoprazole  40 mg Oral QAC breakfast   predniSONE  20 mg Oral Q breakfast   rosuvastatin  20 mg Oral Daily   spironolactone  25 mg Oral Daily   Continuous Infusions:  PRN Meds: acetaminophen **OR** acetaminophen, ipratropium-albuterol, ondansetron **OR** ondansetron (ZOFRAN) IV, senna-docusate   Vital Signs    Vitals:   05/01/23 0808 05/01/23 2011 05/02/23 0454 05/02/23 0811  BP: (!) 148/55 (!) 147/47 (!) 156/74 (!) 150/49  Pulse: 69 68 65 73  Resp: 18 18 16 18   Temp: 98 F (36.7 C) 98.3 F (36.8 C) 97.8 F (36.6 C) 97.9 F (36.6 C)  TempSrc:  Oral Oral   SpO2: 95% 94% 92% (!) 87%  Weight:      Height:        Intake/Output Summary (Last 24 hours) at 05/02/2023 1355 Last data filed at 05/02/2023 1300 Gross per 24 hour  Intake 1112.76 ml  Output 2525 ml  Net -1412.24 ml   Filed Weights   04/29/23 1503  Weight: 133 kg    Physical Exam   GEN: obese, no acute distress.  HEENT: Grossly normal.  Neck: Supple, obese, difficult to gauge JVP.  No carotid bruits or masses. Cardiac: RRR, occas ectopy, 3/6 syst murmur throughout.  No rubs or gallops. No clubbing, cyanosis, trace bilat LE edema.   Radials 2+, DP/PT 2+ and equal bilaterally.  Respiratory:  Respirations regular and unlabored, bibasilar crackles. GI: Obese, soft, nontender, nondistended, BS + x 4. MS: no deformity or atrophy. Skin: warm and dry, no rash. Neuro:  Generalized wkns. Psych: AAOx3.  Normal affect.  Labs    Chemistry Recent Labs  Lab 04/29/23 1507 04/30/23 1300 05/01/23 0401 05/02/23 0417  NA 143 141 139 139  K 4.5 5.1 5.2* 4.1  CL 98 100 99 95*  CO2 28 28 28  33*  GLUCOSE 338* 225* 322* 261*  BUN 49* 63* 68* 73*  CREATININE 1.04* 1.09* 1.14* 1.12*  CALCIUM 9.1 9.2 9.1 9.2  PROT 7.2  --   --   --   ALBUMIN 3.4*  --   --   --   AST 15  --   --   --   ALT 8  --   --   --   ALKPHOS 56  --   --   --   BILITOT 0.8  --   --   --   GFRNONAA 58* 55* 52* 53*  ANIONGAP 17* 13 12 11      Hematology Recent Labs  Lab 04/30/23 0511 05/01/23 0401 05/02/23 0417  WBC 5.7 7.9 7.8  RBC  3.54* 3.41* 3.72*  HGB 10.4* 9.8* 10.8*  HCT 35.0* 32.0* 34.0*  MCV 98.9 93.8 91.4  MCH 29.4 28.7 29.0  MCHC 29.7* 30.6 31.8  RDW 18.1* 18.0* 18.5*  PLT 218 180 207    Cardiac Enzymes  Recent Labs  Lab 04/30/23 1300 04/30/23 1437 05/01/23 0401 05/01/23 0908 05/01/23 1221  TROPONINIHS 1,678* 1,669* 958* 904* 859*      BNP    Component Value Date/Time   BNP 386.8 (H) 04/29/2023 1507    ProBNP    Component Value Date/Time   PROBNP 17.0 12/08/2016 0913   HbA1c  Lab Results  Component Value Date   HGBA1C 9.1 (H) 04/30/2023    Radiology    CT HEAD WO CONTRAST ( )  Result Date: 04/29/2023 CLINICAL DATA:  Mental status change, unknown cause EXAM: CT HEAD WITHOUT CONTRAST TECHNIQUE: Contiguous axial images were obtained from the base of the skull through the vertex without intravenous contrast. RADIATION DOSE REDUCTION: This exam was performed according to the departmental dose-optimization program which includes automated exposure control, adjustment of the mA and/or kV according to patient size  and/or use of iterative reconstruction technique. COMPARISON:  None Available. FINDINGS: Brain: No intracranial hemorrhage, mass effect, or midline shift. No hydrocephalus. The basilar cisterns are patent. No evidence of territorial infarct or acute ischemia. No extra-axial or intracranial fluid collection. Vascular: Atherosclerosis of skullbase vasculature without hyperdense vessel or abnormal calcification. Skull: No fracture or focal lesion. Sinuses/Orbits: Small fluid level in right side of sphenoid sinus. Bilateral cataract resection. No mastoid effusion. Other: None. IMPRESSION: 1. No acute intracranial abnormality. 2. Small fluid level in the right side of sphenoid sinus, may be due to acute sinusitis. Electronically Signed   By: Narda Rutherford M.D.   On: 04/29/2023 19:15   DG Chest Portable 1 View  Result Date: 04/29/2023 CLINICAL DATA:  Shortness of breath EXAM: PORTABLE CHEST 1 VIEW COMPARISON:  X-ray 12/08/2016 FINDINGS: Underinflation. Mildly enlarged cardiopericardial silhouette with calcified aorta and evidence of vascular congestion with trace edema. Small left effusion as well. Thickening along the minor fissure. No pneumothorax. Overlapping cardiac leads. Calcified aorta. IMPRESSION: Enlarged heart with vascular congestion and trace edema. Small left effusion. Electronically Signed   By: Karen Kays M.D.   On: 04/29/2023 15:41    Telemetry    Non-tele  Cardiac Studies   2D Echocardiogram  6.2.2024  1. Left ventricular ejection fraction, by estimation, is 60 to 65%. The  left ventricle has normal function. The left ventricle has no regional  wall motion abnormalities. There is mild concentric left ventricular  hypertrophy. Left ventricular diastolic  parameters are consistent with Grade II diastolic dysfunction  (pseudonormalization). Elevated left ventricular end-diastolic pressure.   2. Right ventricular systolic function is normal. The right ventricular  size is normal.  There is mildly elevated pulmonary artery systolic  pressure.   3. Left atrial size was moderately dilated.   4. The mitral valve is degenerative. No evidence of mitral valve  regurgitation. Mild mitral stenosis. The mean mitral valve gradient is 7.0  mmHg with average heart rate of 73 bpm. Moderate mitral annular  calcification.   5. The aortic valve is tricuspid. There is mild calcification of the  aortic valve. There is mild thickening of the aortic valve. Aortic valve  regurgitation is not visualized. Mild aortic valve stenosis. Aortic valve  area, by VTI measures 1.08 cm.  Aortic valve mean gradient measures 10.0 mmHg. Aortic valve Vmax measures  2.13 m/s.  6. The inferior vena cava is dilated in size with <50% respiratory  variability, suggesting right atrial pressure of 15 mmHg.    Patient Profile     67 y.o. 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  Assessment & Plan    1. Demand ischemia:  Presented 5/31 w/ progressive dyspnea.  No chest pain.  HsTrop 1678  1669  958  904  859.  Echo w/ nl LV fxn and w/o rwma.  Breathing has steadily improved w/ IV lasix.  Will hold eliquis and transition back to heparin w/ plan for R and L heart cath on 6/5. The patient understands that risks include but are not limited to stroke (1 in 1000), death (1 in 1000), kidney failure [usually temporary] (1 in 500), bleeding (1 in 200), allergic reaction [possibly serious] (1 in 200), and agrees to proceed.  Cont ? blocker and statin. Will add ASA.  2.  Acute on chronic hypoxic resp failure/OSA/OHS:  Improving w/ diuresis. Echo w/ nl EF, grade II diast dysfxn, mild MS/AS.  BUN and bicarb  rising.  Hold lasix.  3.  Essential HTN:  Pressures trending 140's to 150's.  Holding lasix.  On telmisartan at home, but not ordered here.  Reluctant to resume today w/ rising BUN in setting of diuresis.  Cont spiro.  F/u renal fxn tomorrow and look to resume ARB therapy.  4.  HL:  No recent eval - f/u.  Cont rosuvastatin.  5.  DVT/PE:  On chronic eliquis - holding w/ prep for cath on Wednesday.  Resume heparin for bridging.  6.  DMII: poorly controlled. A1c 9.1.  Per IM.  7.  Normocytic anemia:  stable.  Signed, Nicolasa Ducking, NP  05/02/2023, 1:55 PM    For questions or updates, please contact   Please consult [email or web address] for contact info under Cardiology/STEMI.

## 2023-05-02 NOTE — TOC CM/SW Note (Signed)
Noted that patient is from [facility].  Message sent to [name] at [facility] to confirm that patient is LTC and to also confirm that they can arrange for the broken piece on patients CPAP to be fixed

## 2023-05-02 NOTE — Care Management Important Message (Signed)
Important Message  Patient Details  Name: Sonya Sanchez MRN: [id number] Date of Birth: [birth year]   Medicare Important Message Given:  N/A - LOS <3 / Initial given by admissions     Johnell Comings 05/02/2023, 8:13 AM

## 2023-05-02 NOTE — Consult Note (Signed)
ANTICOAGULATION CONSULT NOTE  Pharmacy Consult for IV Heparin Indication: pulmonary embolus  Patient Measurements: Height: 5\' 2"  (157.5 cm) Weight: 133 kg (293 lb 3.4 oz) IBW/kg (Calculated) : 50.1 Heparin Dosing Weight: 83.7  Labs: Recent Labs    04/29/23 1917 04/29/23 2200 04/30/23 0511 04/30/23 1300 04/30/23 1437 04/30/23 2044 05/01/23 0401 05/01/23 0908 05/01/23 1221 05/02/23 0417  HGB  --   --  10.4*  --   --   --  9.8*  --   --  10.8*  HCT  --   --  35.0*  --   --   --  32.0*  --   --  34.0*  PLT  --   --  218  --   --   --  180  --   --  207  APTT 30  --  41* 65*  --  65* 114*  --  73*  --   HEPARINUNFRC >1.10*  --   --   --   --   --  >1.10*  --   --   --   CREATININE  --   --   --  1.09*  --   --  1.14*  --   --  1.12*  TROPONINIHS 1,150*   < >  --  1,678*   < >  --  958* 904* 859*  --    < > = values in this interval not displayed.   Estimated Creatinine Clearance: 62.3 mL/min (A) (by C-G formula based on SCr of 1.12 mg/dL (H)).  Medical History: Past Medical History:  Diagnosis Date   Calculus of kidney    Diabetes mellitus without complication (HCC)    DVT (deep venous thrombosis) (HCC)    a. on Eliquis   Family history of early CAD    a. parents passing in their 6's from CAD   Gout, unspecified    Heart murmur    Hyperlipidemia    Hypertension    Morbid obesity (HCC)    Normal cardiac stress test    a. equivocal study, sig soft tissue artifact present, no chest discomfort or ECG changes, perfusion images suggest mod sized region of mild reversible perfusion defect. Findings may be 2/2 shifting soft tissue attenuation, but cannot r/o ischemia, EF 72%   Phlebitis and thrombophlebitis of other deep vessels of lower extremities    Phlebitis and thrombophlebitis of other deep vessels of lower extremities    Pulmonary emboli (HCC)    a. on Eliquis   Spinal stenosis, unspecified region other than cervical    Tobacco abuse    Type II or unspecified type  diabetes mellitus without mention of complication, uncontrolled    Unspecified sleep apnea    Urinary tract infection, site not specified     Medications:  Holding apixaban 5 mg BID pending cardiac catheterization. Heparin peri-operatively  Assessment: 67 y/o F with medical history as above and including hx VTE on apixaban here with acute on chronic respiratory failure in setting of CHF exacerbation, polypharmacy, CPAP non-compliance. Troponin elevated and plan is for ischemic work-up with cardiac catheterization 6/5. Pharmacy consulted for heparin management.  Patient was previously on heparin drip this admission 5/31 - 6/2 and was therapeutic at 1550 units/hr  Goal of Therapy:  Heparin level 0.3-0.7 units/ml aPTT 66 - 102 seconds Monitor platelets by anticoagulation protocol: Yes   Plan:  --Last dose of apixaban 6/3 AM --Re-start heparin infusion at previous therapeutic rate of 1550 units/hr at 2200 tonight,  no bolus --Follow aPTT given interference of apixaban on heparin level. Daily heparin level until correlation established and then switch over to heparin level monitoring --Daily CBC per protocol while on IV heparin  Tressie Ellis 05/02/2023,2:22 PM

## 2023-05-02 NOTE — Plan of Care (Signed)
  Problem: Skin Integrity: Goal: Risk for impaired skin integrity will decrease Outcome: Progressing   Problem: Clinical Measurements: Goal: Ability to maintain clinical measurements within normal limits will improve Outcome: Progressing   Problem: Clinical Measurements: Goal: Respiratory complications will improve Outcome: Progressing   Problem: Clinical Measurements: Goal: Cardiovascular complication will be avoided Outcome: Progressing

## 2023-05-02 NOTE — Progress Notes (Signed)
Progress Note   Patient: Sonya Sanchez NUU:[id number] DOB: [birth year] DOA: 04/29/2023     3 DOS: the patient was seen and examined on 05/02/2023   Brief hospital course: Ms.Quadasia Dosanjh is a 67 year old female with history of insulin-dependent diabetes mellitus, GERD, hypertension, hyperlipidemia, morbid obesity, OSA on CPAP, history of DVT, PE on anticoagulation, chronic respiratory failure on 2 L nasal cannula at baseline, dermatomyositis, baseline bedbound (has not walked for 3 years) prior smoking history, presented to emergency department for chief concerns of respiratory distress.  Patient is not wearing CPAP at the facility.  She was more lethargic upon presentation, troponins elevated, ABG showed high CO2, low oxygen.  Patient was on BiPAP in the ED and gradually weaned to 2 L supplemental oxygen.  She is started on Lasix, DuoNebs, heparin drip admitted to stepdown unit.  Assessment and Plan: * Acute on chronic hypoxic and hypercapnic respiratory failure (HCC) Possibly multifactorial in the setting of CHF exacerbation, noncompliance with CPAP, sedatives use. Off Bipap, continue CPAP at night. Continue oral Lasix home dose. Strict I's and O's. Continue oral prednisone for another 2 days. Continue DuoNebs, supplemental oxygen to maintain saturation greater than 90%. Encourage incentive spirometry.  Lethargy/ metabolic encephalopathy Due to metabolic derangements including hypercarbia, hypoxia. Bipap weaned off. Continue supplemental oxygen. Advised to avoid sedatives, opiates, benzos. She is more alert and able to answer, at baseline.  Elevated troponin- Possibly demand ischemia due to acute on chronic hypercapnic, hypoxic respiratory failure. No chest pain. Trop trended down. Restarted heparin, hold home dose Eliquis. Echo reviewed. Grade 2 diastolic dysfunction. Cardiology plan to do R/L heart cath 05/04/23.  Poorly controlled type 2 diabetes mellitus with circulatory disorder  (HCC) Insulin-dependent diabetes mellitus Insulin sliding scale ordered. Sugars may worsen as she is on steroids. Lantus home dose initiated. She has good appetite, eating fair.  H/o DVT - resume heparin for Eliquis.  Adult onset dermatomyositis (HCC) Continue Azathioprine.  Bedbound Poor functional status- PT/ OT evaluation. Dispo to SNF  Incentive spirometry.  OSA (obstructive sleep apnea) CPAP at night, says she had good fitting mask last night.  Morbid obesity (HCC) This complicates overall care and prognosis.  Diet, exercise and weight reduction advised.  PT/ OT evaluation. Fall, aspiration precautions. DVT porphylaxis - heparin drip    Subjective: Patient is seen and examined today morning.  She is lying in bed.  Shortness of breath much improved. She is on 3L supplemental oxygen. Tolerated CPAP well last night, had good fit mask.   Physical Exam: Vitals:   05/01/23 0808 05/01/23 2011 05/02/23 0454 05/02/23 0811  BP: (!) 148/55 (!) 147/47 (!) 156/74 (!) 150/49  Pulse: 69 68 65 73  Resp: 18 18 16 18   Temp: 98 F (36.7 C) 98.3 F (36.8 C) 97.8 F (36.6 C) 97.9 F (36.6 C)  TempSrc:  Oral Oral   SpO2: 95% 94% 92% (!) 87%  Weight:      Height:       General-elderly morbidly obese Caucasian female, mild respiratory distress. HEENT-PERRLA EOMI, nontender sinuses. Heart-S1-S2 heard, no murmurs.  Trace pedal edema. Lungs-distant breath sounds.  Diffuse rhonchi noted. Abdomen-soft, nontender, bowel sounds good. Neuro-awake oriented x 3, lower extremity weakness, nonfocal.  Data Reviewed:  CT brain, ABG, troponin, CBC, APTT  Family Communication: Discussed with patient she understands and agrees.  Disposition: Status is: Inpatient Remains inpatient appropriate because: respiratory failure, need for cardiac cath.  Planned Discharge Destination: Skilled nursing facility    MDM level 3- Patient admitted with  respiratory failure, is on high risk medication  including heparin drip, need close hemodynamic, telemetry, neurologic monitoring. She is at high risk for sudden clinical deterioration.  Author: Marcelino Duster, MD 05/02/2023 2:26 PM  For on call review [email or web address].

## 2023-05-02 NOTE — Inpatient Diabetes Management (Signed)
Inpatient Diabetes Program Recommendations  AACE/ADA: New Consensus Statement on Inpatient Glycemic Control (2015)  Target Ranges:  Prepandial:   less than 140 mg/dL      Peak postprandial:   less than 180 mg/dL (1-2 hours)      Critically ill patients:  140 - 180 mg/dL    Latest Reference Range & Units 05/01/23 08:09 05/01/23 11:18 05/01/23 17:28 05/01/23 21:13  Glucose-Capillary 70 - 99 mg/dL 161 (H)  11 units Novolog  298 (H)  11 units Novolog  279 (H)  11 units Novolog  355 (H)  5 units Novolog  40 units Semglee  (H): Data is abnormally high  Latest Reference Range & Units 05/02/23 08:11 05/02/23 11:47  Glucose-Capillary 70 - 99 mg/dL 096 (H)  7 units Novolog  276 (H)  11 units Novolog   (H): Data is abnormally high        SNF DM Meds: Several Meds listed--called the SNF 6/3 and got clarification: Lantus 40 units QHS + Fiasp Insulin TID per SSI + Jardiance 10 mg daily   Current Orders: Semglee 40 units QHS     Novolog 0-20 units TID ac/hs   Solumedrol stopped--last dose given yest AM Started Prednisone 20 mg Daily today    MD- Please consider:  1. Increase Semglee slightly to 42 units QHS (AM CBG still >200 today)  2. Start Novolog Meal Coverage: Novolog 4 units TID with meals HOLD if pt NPO HOLD it pt eats <50% meals    --Will follow patient during hospitalization--  Ambrose Finland RN, MSN, CDCES Diabetes Coordinator Inpatient Glycemic Control Team Team Pager: [phone] (8a-5p)

## 2023-05-03 DIAGNOSIS — R7989 Other specified abnormal findings of blood chemistry: Secondary | ICD-10-CM | POA: Diagnosis not present

## 2023-05-03 DIAGNOSIS — J9621 Acute and chronic respiratory failure with hypoxia: Secondary | ICD-10-CM | POA: Diagnosis not present

## 2023-05-03 DIAGNOSIS — I5031 Acute diastolic (congestive) heart failure: Secondary | ICD-10-CM | POA: Diagnosis not present

## 2023-05-03 LAB — APTT
aPTT: 56 seconds — ABNORMAL HIGH (ref 24–36)
aPTT: 66 seconds — ABNORMAL HIGH (ref 24–36)
aPTT: 70 seconds — ABNORMAL HIGH (ref 24–36)

## 2023-05-03 LAB — BASIC METABOLIC PANEL
Anion gap: 13 (ref 5–15)
Anion gap: 11 (ref 5–15)
BUN: 71 mg/dL — ABNORMAL HIGH (ref 8–23)
BUN: 71 mg/dL — ABNORMAL HIGH (ref 8–23)
CO2: 32 mmol/L (ref 22–32)
CO2: 32 mmol/L (ref 22–32)
Calcium: 9.1 mg/dL (ref 8.9–10.3)
Calcium: 9.3 mg/dL (ref 8.9–10.3)
Chloride: 94 mmol/L — ABNORMAL LOW (ref 98–111)
Chloride: 96 mmol/L — ABNORMAL LOW (ref 98–111)
Creatinine, Ser: 1.16 mg/dL — ABNORMAL HIGH (ref 0.44–1.00)
Creatinine, Ser: 1.08 mg/dL — ABNORMAL HIGH (ref 0.44–1.00)
GFR, Estimated: 51 mL/min — ABNORMAL LOW (ref >60)
GFR, Estimated: 56 mL/min — ABNORMAL LOW (ref >60)
Glucose, Bld: 225 mg/dL — ABNORMAL HIGH (ref 70–99)
Glucose, Bld: 249 mg/dL — ABNORMAL HIGH (ref 70–99)
Potassium: 4.1 mmol/L (ref 3.5–5.1)
Potassium: 4.5 mmol/L (ref 3.5–5.1)
Sodium: 139 mmol/L (ref 135–145)
Sodium: 139 mmol/L (ref 135–145)

## 2023-05-03 LAB — GLUCOSE, CAPILLARY
Glucose-Capillary: 184 mg/dL — ABNORMAL HIGH (ref 70–99)
Glucose-Capillary: 188 mg/dL — ABNORMAL HIGH (ref 70–99)
Glucose-Capillary: 225 mg/dL — ABNORMAL HIGH (ref 70–99)
Glucose-Capillary: 235 mg/dL — ABNORMAL HIGH (ref 70–99)

## 2023-05-03 LAB — LIPID PANEL
Cholesterol: 196 mg/dL (ref 0–200)
HDL: 26 mg/dL — ABNORMAL LOW (ref >40)
LDL Cholesterol: 129 mg/dL — ABNORMAL HIGH (ref 0–99)
Total CHOL/HDL Ratio: 7.5 RATIO
Triglycerides: 206 mg/dL — ABNORMAL HIGH (ref <150)
VLDL: 41 mg/dL — ABNORMAL HIGH (ref 0–40)

## 2023-05-03 LAB — HEPARIN LEVEL (UNFRACTIONATED): Heparin Unfractionated: >1.1 IU/mL — ABNORMAL HIGH (ref 0.30–0.70)

## 2023-05-03 LAB — CULTURE, BLOOD (ROUTINE X 2)

## 2023-05-03 LAB — CBC
HCT: 32 % — ABNORMAL LOW (ref 36.0–46.0)
Hemoglobin: 10.3 g/dL — ABNORMAL LOW (ref 12.0–15.0)
MCH: 29.3 pg (ref 26.0–34.0)
MCHC: 32.2 g/dL (ref 30.0–36.0)
MCV: 90.9 fL (ref 80.0–100.0)
Platelets: 200 10*3/uL (ref 150–400)
RBC: 3.52 MIL/uL — ABNORMAL LOW (ref 3.87–5.11)
RDW: 18.6 % — ABNORMAL HIGH (ref 11.5–15.5)
WBC: 7.7 10*3/uL (ref 4.0–10.5)
nRBC: 0.4 % — ABNORMAL HIGH (ref 0.0–0.2)

## 2023-05-03 MED ORDER — ASPIRIN 81 MG PO CHEW
81.0000 mg | CHEWABLE_TABLET | ORAL | Status: AC
  Administered 2023-05-04: 81 mg via ORAL
  Filled 2023-05-03: qty 1

## 2023-05-03 MED ORDER — SODIUM CHLORIDE 0.9% FLUSH: 3.0000 mL | INTRAVENOUS | Status: DC | PRN

## 2023-05-03 MED ORDER — SODIUM CHLORIDE 0.9 % IV SOLN: 250.0000 mL | INTRAVENOUS | Status: DC | PRN

## 2023-05-03 MED ORDER — IRBESARTAN 150 MG PO TABS
300.0000 mg | ORAL_TABLET | Freq: Every day | ORAL | Status: DC
  Administered 2023-05-03 – 2023-05-05 (×3): 300 mg via ORAL
  Filled 2023-05-03 (×3): qty 2

## 2023-05-03 MED ORDER — SODIUM CHLORIDE 0.9 % IV SOLN: INTRAVENOUS | Status: DC

## 2023-05-03 MED ORDER — HEPARIN BOLUS VIA INFUSION
1250.0000 [IU] | Freq: Once | INTRAVENOUS | Status: AC
  Administered 2023-05-03: 1250 [IU] via INTRAVENOUS
  Filled 2023-05-03: qty 1250

## 2023-05-03 NOTE — NC FL2 (Signed)
[place] MEDICAID FL2 LEVEL OF CARE FORM     IDENTIFICATION  Patient Name: Sonya Sanchez Birthdate: 1956-11-15 Sex: female Admission Date (Current Location): 04/29/2023  [facility] and [place] Number:  Chiropodist and Address:         Provider Number: [id number]  Attending Physician Name and Address:  Marcelino Duster, MD  Relative Name and Phone Number:       Current Level of Care: Hospital Recommended Level of Care: Nursing Facility Prior Approval Number:    Date Approved/Denied:   PASRR Number: [id number]  Discharge Plan: SNF    Current Diagnoses: Patient Active Problem List   Diagnosis Date Noted   Acute heart failure with preserved ejection fraction (HCC) 05/02/2023   Acute respiratory failure with hypoxia and hypercapnia (HCC) 04/30/2023   Acute metabolic encephalopathy 04/30/2023   Acute on chronic hypoxic respiratory failure (HCC) 04/29/2023   Bedbound 04/29/2023   Adult onset dermatomyositis (HCC) 04/29/2023   Altered mental status 04/29/2023   Irritable bowel syndrome with constipation 05/11/2019   Post-nasal drip 02/21/2019   Allergic rhinitis 02/21/2019   Cataracts, bilateral 01/27/2018   Uncontrolled type 2 diabetes mellitus with hyperglycemia (HCC) 01/19/2018   Bronchitis 12/22/2016   OSA (obstructive sleep apnea) 12/08/2016   Elevated troponin 08/11/2015   Poorly controlled type 2 diabetes mellitus with circulatory disorder (HCC)    Aortic valve stenosis 01/30/2014   Chronic venous insufficiency 08/03/2013   Pulmonary embolism (HCC) 03/21/2013   DVT (deep venous thrombosis) (HCC) 03/21/2013   Hyperlipidemia 02/13/2013   Morbid obesity (HCC) 02/13/2013   Essential hypertension 12/25/2012    Orientation RESPIRATION BLADDER Height & Weight     Self, Time, Situation, Place  O2 (3L [place]) Incontinent Weight: 133 kg Height:  5\' 2"  (157.5 cm)  BEHAVIORAL SYMPTOMS/MOOD NEUROLOGICAL BOWEL NUTRITION STATUS      Incontinent Diet (Heart  Healthy Carb modified)  AMBULATORY STATUS COMMUNICATION OF NEEDS Skin   Total Care Verbally Other (Comment) (Scratches and redness)                       Personal Care Assistance Level of Assistance              Functional Limitations Info             SPECIAL CARE FACTORS FREQUENCY                       Contractures      Additional Factors Info  Code Status, Allergies Code Status Info: Full Allergies Info: sulfa antibiotics           Current Medications (05/03/2023):  This is the current hospital active medication list Current Facility-Administered Medications  Medication Dose Route Frequency Provider Last Rate Last Admin   0.9 %  sodium chloride infusion  250 mL Intravenous PRN Creig Hines, NP       [START ON 05/04/2023] 0.9 %  sodium chloride infusion   Intravenous Continuous Creig Hines, NP       acetaminophen (TYLENOL) tablet 650 mg  650 mg Oral Q6H PRN Cox, Amy N, DO       Or   acetaminophen (TYLENOL) suppository 650 mg  650 mg Rectal Q6H PRN Cox, Amy N, DO       [START ON 05/04/2023] aspirin chewable tablet 81 mg  81 mg Oral Pre-Cath Creig Hines, NP       aspirin chewable tablet 81 mg  81 mg  Oral Daily Creig Hines, NP   81 mg at 05/03/23 4098   azaTHIOprine (IMURAN) tablet 100 mg  100 mg Oral BID Marcelino Duster, MD   100 mg at 05/03/23 0928   fluticasone furoate-vilanterol (BREO ELLIPTA) 100-25 MCG/ACT 1 puff  1 puff Inhalation Daily Marcelino Duster, MD   1 puff at 05/03/23 1191   And   umeclidinium bromide (INCRUSE ELLIPTA) 62.5 MCG/ACT 1 puff  1 puff Inhalation Daily Marcelino Duster, MD   1 puff at 05/03/23 0928   heparin ADULT infusion 100 units/mL (25000 units/283mL)  1,700 Units/hr Intravenous Continuous Marcelino Duster, MD 17 mL/hr at 05/03/23 1320 1,700 Units/hr at 05/03/23 1320   insulin aspart (novoLOG) injection 0-20 Units  0-20 Units Subcutaneous TID WC Cox, Amy N,  DO   4 Units at 05/03/23 1320   insulin aspart (novoLOG) injection 0-5 Units  0-5 Units Subcutaneous QHS Cox, Amy N, DO   3 Units at 05/02/23 2134   insulin aspart (novoLOG) injection 4 Units  4 Units Subcutaneous TID WC Marcelino Duster, MD   4 Units at 05/03/23 0930   insulin glargine-yfgn (SEMGLEE) injection 42 Units  42 Units Subcutaneous QHS Marcelino Duster, MD   42 Units at 05/02/23 2135   ipratropium-albuterol (DUONEB) 0.5-2.5 (3) MG/3ML nebulizer solution 3 mL  3 mL Nebulization Q4H PRN Marcelino Duster, MD       irbesartan (AVAPRO) tablet 300 mg  300 mg Oral Daily Creig Hines, NP   300 mg at 05/03/23 1441   metoprolol succinate (TOPROL-XL) 24 hr tablet 25 mg  25 mg Oral Daily Marcelino Duster, MD   25 mg at 05/03/23 4782   multivitamin with minerals tablet 1 tablet  1 tablet Oral Daily Marcelino Duster, MD   1 tablet at 05/03/23 0928   ondansetron (ZOFRAN) tablet 4 mg  4 mg Oral Q6H PRN Cox, Amy N, DO       Or   ondansetron (ZOFRAN) injection 4 mg  4 mg Intravenous Q6H PRN Cox, Amy N, DO       pantoprazole (PROTONIX) EC tablet 40 mg  40 mg Oral QAC breakfast Marcelino Duster, MD   40 mg at 05/03/23 0929   predniSONE (DELTASONE) tablet 20 mg  20 mg Oral Q breakfast Marcelino Duster, MD   20 mg at 05/03/23 0929   rosuvastatin (CRESTOR) tablet 20 mg  20 mg Oral Daily Marcelino Duster, MD   20 mg at 05/03/23 0929   senna-docusate (Senokot-S) tablet 1 tablet  1 tablet Oral QHS PRN Cox, Amy N, DO       sodium chloride flush (NS) 0.9 % injection 3 mL  3 mL Intravenous Q12H Creig Hines, NP   3 mL at 05/03/23 9562   sodium chloride flush (NS) 0.9 % injection 3 mL  3 mL Intravenous PRN Creig Hines, NP       spironolactone (ALDACTONE) tablet 25 mg  25 mg Oral Daily Marcelino Duster, MD   25 mg at 05/03/23 1308     Discharge Medications: Please see discharge summary for a list of discharge  medications.  Relevant Imaging Results:  Relevant Lab Results:   Additional Information ss [id number]  Chapman Fitch, RN

## 2023-05-03 NOTE — Consult Note (Addendum)
ANTICOAGULATION CONSULT NOTE  Pharmacy Consult for IV Heparin Indication: pulmonary embolus  Patient Measurements: Height: 5\' 2"  (157.5 cm) Weight: 133 kg (293 lb 3.4 oz) IBW/kg (Calculated) : 50.1 Heparin Dosing Weight: 83.7  Labs: Recent Labs    05/01/23 0401 05/01/23 0908 05/01/23 1221 05/02/23 0417 05/03/23 0421 05/03/23 1242  HGB 9.8*  --   --  10.8* 10.3*  --   HCT 32.0*  --   --  34.0* 32.0*  --   PLT 180  --   --  207 200  --   APTT 114*  --  73*  --  56* 66*  HEPARINUNFRC >1.10*  --   --   --  >1.10*  --   CREATININE 1.14*  --   --  1.12* 1.16*  --   TROPONINIHS 958* 904* 859*  --   --   --     Estimated Creatinine Clearance: 60.2 mL/min (A) (by C-G formula based on SCr of 1.16 mg/dL (H)).  Medical History: Past Medical History:  Diagnosis Date   Calculus of kidney    Diabetes mellitus without complication (HCC)    DVT (deep venous thrombosis) (HCC)    a. on Eliquis   Family history of early CAD    a. parents passing in their 36's from CAD   Gout, unspecified    Heart murmur    Hyperlipidemia    Hypertension    Morbid obesity (HCC)    Normal cardiac stress test    a. equivocal study, sig soft tissue artifact present, no chest discomfort or ECG changes, perfusion images suggest mod sized region of mild reversible perfusion defect. Findings may be 2/2 shifting soft tissue attenuation, but cannot r/o ischemia, EF 72%   Phlebitis and thrombophlebitis of other deep vessels of lower extremities    Phlebitis and thrombophlebitis of other deep vessels of lower extremities    Pulmonary emboli (HCC)    a. on Eliquis   Spinal stenosis, unspecified region other than cervical    Tobacco abuse    Type II or unspecified type diabetes mellitus without mention of complication, uncontrolled    Unspecified sleep apnea    Urinary tract infection, site not specified     Medications:  Holding apixaban 5 mg BID pending cardiac catheterization. Heparin  peri-operatively  Assessment: 67 y/o F with medical history as above and including hx VTE on apixaban here with acute on chronic respiratory failure in setting of CHF exacerbation, polypharmacy, CPAP non-compliance. Troponin elevated and plan is for ischemic work-up with cardiac catheterization 6/5. Pharmacy consulted for heparin management.  Patient was previously on heparin drip this admission 5/31 - 6/2 and was therapeutic at 1550 units/hr  0604 0421 aPTT 56, HL > 1.1; 1550 un/hr 0604 1242 aPTT 66; 1700 un/hr  Goal of Therapy:  Heparin level 0.3-0.7 units/ml aPTT 66 - 102 seconds Monitor platelets by anticoagulation protocol: Yes   Plan:  --aPTT is therapeutic x 1 at lower limit of goal range --Continue heparin infusion at current rate of 1700 units/hr --Re-check confirmatory aPTT in 6 hours Follow aPTT given interference of apixaban on heparin level. Daily heparin level until correlation established and then switch over to heparin level monitoring --Daily CBC per protocol while on IV heparin  Tressie Ellis 05/03/2023,1:23 PM

## 2023-05-03 NOTE — Consult Note (Signed)
ANTICOAGULATION CONSULT NOTE  Pharmacy Consult for IV Heparin Indication: pulmonary embolus  Patient Measurements: Height: 5\' 2"  (157.5 cm) Weight: 133 kg (293 lb 3.4 oz) IBW/kg (Calculated) : 50.1 Heparin Dosing Weight: 83.7  Labs: Recent Labs    05/01/23 0401 05/01/23 0908 05/01/23 1221 05/02/23 0417 05/03/23 0421 05/03/23 1242 05/03/23 1417 05/03/23 1904  HGB 9.8*  --   --  10.8* 10.3*  --   --   --   HCT 32.0*  --   --  34.0* 32.0*  --   --   --   PLT 180  --   --  207 200  --   --   --   APTT 114*  --  73*  --  56* 66*  --  70*  HEPARINUNFRC >1.10*  --   --   --  >1.10*  --   --   --   CREATININE 1.14*  --   --  1.12* 1.16*  --  1.08*  --   TROPONINIHS 958* 904* 859*  --   --   --   --   --     Estimated Creatinine Clearance: 64.6 mL/min (A) (by C-G formula based on SCr of 1.08 mg/dL (H)).  Medical History: Past Medical History:  Diagnosis Date   Calculus of kidney    Diabetes mellitus without complication (HCC)    DVT (deep venous thrombosis) (HCC)    a. on Eliquis   Family history of early CAD    a. parents passing in their 64's from CAD   Gout, unspecified    Heart murmur    Hyperlipidemia    Hypertension    Morbid obesity (HCC)    Normal cardiac stress test    a. equivocal study, sig soft tissue artifact present, no chest discomfort or ECG changes, perfusion images suggest mod sized region of mild reversible perfusion defect. Findings may be 2/2 shifting soft tissue attenuation, but cannot r/o ischemia, EF 72%   Phlebitis and thrombophlebitis of other deep vessels of lower extremities    Phlebitis and thrombophlebitis of other deep vessels of lower extremities    Pulmonary emboli (HCC)    a. on Eliquis   Spinal stenosis, unspecified region other than cervical    Tobacco abuse    Type II or unspecified type diabetes mellitus without mention of complication, uncontrolled    Unspecified sleep apnea    Urinary tract infection, site not specified      Medications:  Holding apixaban 5 mg BID pending cardiac catheterization. Heparin peri-operatively  Assessment: 67 y/o F with medical history as above and including hx VTE on apixaban here with acute on chronic respiratory failure in setting of CHF exacerbation, polypharmacy, CPAP non-compliance. Troponin elevated and plan is for ischemic work-up with cardiac catheterization 6/5. Pharmacy consulted for heparin management.  Patient was previously on heparin drip this admission 5/31 - 6/2 and was therapeutic at 1550 units/hr  0604 0421 aPTT 56, HL > 1.1; 1550 un/hr 0604 1242 aPTT 66; 1700 un/hr 0604 1904 aPTT 70; 1700 un/hr  Goal of Therapy:  Heparin level 0.3-0.7 units/ml aPTT 66 - 102 seconds Monitor platelets by anticoagulation protocol: Yes   Plan:  --aPTT is therapeutic x 2 --Continue heparin infusion at current rate of 1700 units/hr --Will check aPTT and HL with next AM labs Follow aPTT given interference of apixaban on heparin level. Daily heparin level until correlation established and then switch over to heparin level monitoring --Daily CBC per protocol while  on IV heparin  Will M. Dareen Piano, PharmD PGY-1 Pharmacy Resident 05/03/2023 7:47 PM

## 2023-05-03 NOTE — H&P (View-Only) (Signed)
Cardiology Progress Note   Patient Name: Sonya Sanchez Date of Encounter: 05/03/2023  Primary Cardiologist: Julien Nordmann, MD  Subjective   Breathing slightly better.  Currently sitting up and eating.  No chest pain.    Inpatient Medications    Scheduled Meds:  aspirin  81 mg Oral Daily   azaTHIOprine  100 mg Oral BID   fluticasone furoate-vilanterol  1 puff Inhalation Daily   And   umeclidinium bromide  1 puff Inhalation Daily   insulin aspart  0-20 Units Subcutaneous TID WC   insulin aspart  0-5 Units Subcutaneous QHS   insulin aspart  4 Units Subcutaneous TID WC   insulin glargine-yfgn  42 Units Subcutaneous QHS   metoprolol succinate  25 mg Oral Daily   multivitamin with minerals  1 tablet Oral Daily   pantoprazole  40 mg Oral QAC breakfast   predniSONE  20 mg Oral Q breakfast   rosuvastatin  20 mg Oral Daily   sodium chloride flush  3 mL Intravenous Q12H   spironolactone  25 mg Oral Daily   Continuous Infusions:  heparin 1,700 Units/hr (05/03/23 1320)   PRN Meds: acetaminophen **OR** acetaminophen, ipratropium-albuterol, ondansetron **OR** ondansetron (ZOFRAN) IV, senna-docusate   Vital Signs    Vitals:   05/02/23 1519 05/02/23 1938 05/03/23 0335 05/03/23 0742  BP: (!) 148/48 (!) 156/61 (!) 150/59 (!) 182/55  Pulse: 64 65 (!) 57 61  Resp: 18 18 18 18   Temp: 98.4 F (36.9 C) 98 F (36.7 C) 99 F (37.2 C) 98.2 F (36.8 C)  TempSrc: Oral Oral Oral   SpO2: 94% 97% 96% 93%  Weight:      Height:        Intake/Output Summary (Last 24 hours) at 05/03/2023 1327 Last data filed at 05/03/2023 1139 Gross per 24 hour  Intake 957.02 ml  Output 1700 ml  Net -742.98 ml   Filed Weights   04/29/23 1503  Weight: 133 kg    Physical Exam   GEN: obese, in no acute distress.  HEENT: Grossly normal.  Neck: Supple, obese, difficult to gauge JVP.  No carotid bruits or masses. Cardiac: RRR, 3/6 syst murmur throughout, no rubs or gallops. No clubbing, cyanosis, trace  bilat LE edema.  Radials 2+, DP/PT 2+ and equal bilaterally.  Respiratory:  Respirations regular and unlabored, diminished breath sounds @ bilat bases. GI: obese, soft, nontender, nondistended, BS + x 4. MS: no deformity or atrophy. Skin: warm and dry, no rash. Neuro:  Strength and sensation are intact. Psych: AAOx3.  Normal affect.  Labs    Chemistry Recent Labs  Lab 04/29/23 1507 04/30/23 1300 05/01/23 0401 05/02/23 0417 05/03/23 0421  NA 143   < > 139 139 139  K 4.5   < > 5.2* 4.1 4.1  CL 98   < > 99 95* 94*  CO2 28   < > 28 33* 32  GLUCOSE 338*   < > 322* 261* 225*  BUN 49*   < > 68* 73* 71*  CREATININE 1.04*   < > 1.14* 1.12* 1.16*  CALCIUM 9.1   < > 9.1 9.2 9.1  PROT 7.2  --   --   --   --   ALBUMIN 3.4*  --   --   --   --   AST 15  --   --   --   --   ALT 8  --   --   --   --   [facility]  56  --   --   --   --   BILITOT 0.8  --   --   --   --   GFRNONAA 58*   < > 52* 53* 51*  ANIONGAP 17*   < > 12 11 13    < > = values in this interval not displayed.     Hematology Recent Labs  Lab 05/01/23 0401 05/02/23 0417 05/03/23 0421  WBC 7.9 7.8 7.7  RBC 3.41* 3.72* 3.52*  HGB 9.8* 10.8* 10.3*  HCT 32.0* 34.0* 32.0*  MCV 93.8 91.4 90.9  MCH 28.7 29.0 29.3  MCHC 30.6 31.8 32.2  RDW 18.0* 18.5* 18.6*  PLT 180 207 200    Cardiac Enzymes  Recent Labs  Lab 04/30/23 1300 04/30/23 1437 05/01/23 0401 05/01/23 0908 05/01/23 1221  TROPONINIHS 1,678* 1,669* 958* 904* 859*      BNP    Component Value Date/Time   BNP 386.8 (H) 04/29/2023 1507    ProBNP    Component Value Date/Time   PROBNP 17.0 12/08/2016 0913   Lipids  Lab Results  Component Value Date   CHOL 196 05/03/2023   HDL 26 (L) 05/03/2023   LDLCALC 129 (H) 05/03/2023   LDLDIRECT 138.5 02/13/2013   TRIG 206 (H) 05/03/2023   CHOLHDL 7.5 05/03/2023    HbA1c  Lab Results  Component Value Date   HGBA1C 9.1 (H) 04/30/2023    Radiology    CT HEAD WO CONTRAST ( )  Result Date:  04/29/2023 CLINICAL DATA:  Mental status change, unknown cause EXAM: CT HEAD WITHOUT CONTRAST TECHNIQUE: Contiguous axial images were obtained from the base of the skull through the vertex without intravenous contrast. RADIATION DOSE REDUCTION: This exam was performed according to the departmental dose-optimization program which includes automated exposure control, adjustment of the mA and/or kV according to patient size and/or use of iterative reconstruction technique. COMPARISON:  None Available. FINDINGS: Brain: No intracranial hemorrhage, mass effect, or midline shift. No hydrocephalus. The basilar cisterns are patent. No evidence of territorial infarct or acute ischemia. No extra-axial or intracranial fluid collection. Vascular: Atherosclerosis of skullbase vasculature without hyperdense vessel or abnormal calcification. Skull: No fracture or focal lesion. Sinuses/Orbits: Small fluid level in right side of sphenoid sinus. Bilateral cataract resection. No mastoid effusion. Other: None. IMPRESSION: 1. No acute intracranial abnormality. 2. Small fluid level in the right side of sphenoid sinus, may be due to acute sinusitis. Electronically Signed   By: Narda Rutherford M.D.   On: 04/29/2023 19:15   DG Chest Portable 1 View  Result Date: 04/29/2023 CLINICAL DATA:  Shortness of breath EXAM: PORTABLE CHEST 1 VIEW COMPARISON:  X-ray 12/08/2016 FINDINGS: Underinflation. Mildly enlarged cardiopericardial silhouette with calcified aorta and evidence of vascular congestion with trace edema. Small left effusion as well. Thickening along the minor fissure. No pneumothorax. Overlapping cardiac leads. Calcified aorta. IMPRESSION: Enlarged heart with vascular congestion and trace edema. Small left effusion. Electronically Signed   By: Karen Kays M.D.   On: 04/29/2023 15:41    Telemetry    Non-tele  Cardiac Studies   2D Echocardiogram  6.2.2024   1. Left ventricular ejection fraction, by estimation, is 60 to 65%.  The  left ventricle has normal function. The left ventricle has no regional  wall motion abnormalities. There is mild concentric left ventricular  hypertrophy. Left ventricular diastolic  parameters are consistent with Grade II diastolic dysfunction  (pseudonormalization). Elevated left ventricular end-diastolic pressure.   2. Right ventricular systolic function is normal.  The right ventricular  size is normal. There is mildly elevated pulmonary artery systolic  pressure.   3. Left atrial size was moderately dilated.   4. The mitral valve is degenerative. No evidence of mitral valve  regurgitation. Mild mitral stenosis. The mean mitral valve gradient is 7.0  mmHg with average heart rate of 73 bpm. Moderate mitral annular  calcification.   5. The aortic valve is tricuspid. There is mild calcification of the  aortic valve. There is mild thickening of the aortic valve. Aortic valve  regurgitation is not visualized. Mild aortic valve stenosis. Aortic valve  area, by VTI measures 1.08 cm.  Aortic valve mean gradient measures 10.0 mmHg. Aortic valve Vmax measures  2.13 m/s.   6. The inferior vena cava is dilated in size with <50% respiratory  variability, suggesting right atrial pressure of 15 mmHg.   Patient Profile     67 y.o. 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   Assessment & Plan    1. Demand ischemia:  Presented 5/31 w/ progressive dyspnea.  No chest pain.  HsTrop 1678  1669  958  904  859.  Echo w/ nl LV fxn and w/o rwma.   Breathing slowly improving.  Lasix on hold.  No chest pain.  Eliquis held beginning June 3 and she is currently being bridged with heparin.  Plan  for diagnostic catheterization tomorrow.  Continue aspirin, beta-blocker, and statin therapy.  2.  Acute on chronic hypoxic respiratory failure/sleep apnea/OHS: Breathing slightly better today.  Echo with normal LV function, grade 2 diastolic dysfunction, mild MS/AS.  Lasix on hold.  Renal function relatively stable.  Still with mild ankle edema though body habitus otherwise makes exam very challenging.  Plan for right and left heart cardiac catheterization tomorrow.  3.  Essential hypertension: Blood pressures continue to trend in the 140s to 150s with at least 1 reading in the 180s.  Was on telmisartan at home.  Will resume.  Continue beta-blocker.  4.  Hyperlipidemia: LDL of 129.  Continue rosuvastatin.  5.  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  Currently on heparin with plan to resume Eliquis post catheterization.  6.  Type 2 diabetes mellitus: Poorly controlled.  A1c 9.1.  Per internal medicine.  7.  Normocytic anemia: Stable.  Signed, Nicolasa Ducking, NP  05/03/2023, 1:27 PM    For questions or updates, please contact   Please consult [email or web address] for contact info under Cardiology/STEMI.

## 2023-05-03 NOTE — Progress Notes (Signed)
 Cardiology Progress Note   Patient Name: Sonya Sanchez Date of Encounter: 05/03/2023  Primary Cardiologist: Timothy Gollan, MD  Subjective   Breathing slightly better.  Currently sitting up and eating.  No chest pain.    Inpatient Medications    Scheduled Meds:  aspirin  81 mg Oral Daily   azaTHIOprine  100 mg Oral BID   fluticasone furoate-vilanterol  1 puff Inhalation Daily   And   umeclidinium bromide  1 puff Inhalation Daily   insulin aspart  0-20 Units Subcutaneous TID WC   insulin aspart  0-5 Units Subcutaneous QHS   insulin aspart  4 Units Subcutaneous TID WC   insulin glargine-yfgn  42 Units Subcutaneous QHS   metoprolol succinate  25 mg Oral Daily   multivitamin with minerals  1 tablet Oral Daily   pantoprazole  40 mg Oral QAC breakfast   predniSONE  20 mg Oral Q breakfast   rosuvastatin  20 mg Oral Daily   sodium chloride flush  3 mL Intravenous Q12H   spironolactone  25 mg Oral Daily   Continuous Infusions:  heparin 1,700 Units/hr (05/03/23 1320)   PRN Meds: acetaminophen **OR** acetaminophen, ipratropium-albuterol, ondansetron **OR** ondansetron (ZOFRAN) IV, senna-docusate   Vital Signs    Vitals:   05/02/23 1519 05/02/23 1938 05/03/23 0335 05/03/23 0742  BP: (!) 148/48 (!) 156/61 (!) 150/59 (!) 182/55  Pulse: 64 65 (!) 57 61  Resp: 18 18 18 18  Temp: 98.4 F (36.9 C) 98 F (36.7 C) 99 F (37.2 C) 98.2 F (36.8 C)  TempSrc: Oral Oral Oral   SpO2: 94% 97% 96% 93%  Weight:      Height:        Intake/Output Summary (Last 24 hours) at 05/03/2023 1327 Last data filed at 05/03/2023 1139 Gross per 24 hour  Intake 957.02 ml  Output 1700 ml  Net -742.98 ml   Filed Weights   04/29/23 1503  Weight: 133 kg    Physical Exam   GEN: obese, in no acute distress.  HEENT: Grossly normal.  Neck: Supple, obese, difficult to gauge JVP.  No carotid bruits or masses. Cardiac: RRR, 3/6 syst murmur throughout, no rubs or gallops. No clubbing, cyanosis, trace  bilat LE edema.  Radials 2+, DP/PT 2+ and equal bilaterally.  Respiratory:  Respirations regular and unlabored, diminished breath sounds @ bilat bases. GI: obese, soft, nontender, nondistended, BS + x 4. MS: no deformity or atrophy. Skin: warm and dry, no rash. Neuro:  Strength and sensation are intact. Psych: AAOx3.  Normal affect.  Labs    Chemistry Recent Labs  Lab 04/29/23 1507 04/30/23 1300 05/01/23 0401 05/02/23 0417 05/03/23 0421  NA 143   < > 139 139 139  K 4.5   < > 5.2* 4.1 4.1  CL 98   < > 99 95* 94*  CO2 28   < > 28 33* 32  GLUCOSE 338*   < > 322* 261* 225*  BUN 49*   < > 68* 73* 71*  CREATININE 1.04*   < > 1.14* 1.12* 1.16*  CALCIUM 9.1   < > 9.1 9.2 9.1  PROT 7.2  --   --   --   --   ALBUMIN 3.4*  --   --   --   --   AST 15  --   --   --   --   ALT 8  --   --   --   --   ALKPHOS   56  --   --   --   --   BILITOT 0.8  --   --   --   --   GFRNONAA 58*   < > 52* 53* 51*  ANIONGAP 17*   < > 12 11 13   < > = values in this interval not displayed.     Hematology Recent Labs  Lab 05/01/23 0401 05/02/23 0417 05/03/23 0421  WBC 7.9 7.8 7.7  RBC 3.41* 3.72* 3.52*  HGB 9.8* 10.8* 10.3*  HCT 32.0* 34.0* 32.0*  MCV 93.8 91.4 90.9  MCH 28.7 29.0 29.3  MCHC 30.6 31.8 32.2  RDW 18.0* 18.5* 18.6*  PLT 180 207 200    Cardiac Enzymes  Recent Labs  Lab 04/30/23 1300 04/30/23 1437 05/01/23 0401 05/01/23 0908 05/01/23 1221  TROPONINIHS 1,678* 1,669* 958* 904* 859*      BNP    Component Value Date/Time   BNP 386.8 (H) 04/29/2023 1507    ProBNP    Component Value Date/Time   PROBNP 17.0 12/08/2016 0913   Lipids  Lab Results  Component Value Date   CHOL 196 05/03/2023   HDL 26 (L) 05/03/2023   LDLCALC 129 (H) 05/03/2023   LDLDIRECT 138.5 02/13/2013   TRIG 206 (H) 05/03/2023   CHOLHDL 7.5 05/03/2023    HbA1c  Lab Results  Component Value Date   HGBA1C 9.1 (H) 04/30/2023    Radiology    CT HEAD WO CONTRAST (5MM)  Result Date:  04/29/2023 CLINICAL DATA:  Mental status change, unknown cause EXAM: CT HEAD WITHOUT CONTRAST TECHNIQUE: Contiguous axial images were obtained from the base of the skull through the vertex without intravenous contrast. RADIATION DOSE REDUCTION: This exam was performed according to the departmental dose-optimization program which includes automated exposure control, adjustment of the mA and/or kV according to patient size and/or use of iterative reconstruction technique. COMPARISON:  None Available. FINDINGS: Brain: No intracranial hemorrhage, mass effect, or midline shift. No hydrocephalus. The basilar cisterns are patent. No evidence of territorial infarct or acute ischemia. No extra-axial or intracranial fluid collection. Vascular: Atherosclerosis of skullbase vasculature without hyperdense vessel or abnormal calcification. Skull: No fracture or focal lesion. Sinuses/Orbits: Small fluid level in right side of sphenoid sinus. Bilateral cataract resection. No mastoid effusion. Other: None. IMPRESSION: 1. No acute intracranial abnormality. 2. Small fluid level in the right side of sphenoid sinus, may be due to acute sinusitis. Electronically Signed   By: Melanie  Sanford M.D.   On: 04/29/2023 19:15   DG Chest Portable 1 View  Result Date: 04/29/2023 CLINICAL DATA:  Shortness of breath EXAM: PORTABLE CHEST 1 VIEW COMPARISON:  X-ray 12/08/2016 FINDINGS: Underinflation. Mildly enlarged cardiopericardial silhouette with calcified aorta and evidence of vascular congestion with trace edema. Small left effusion as well. Thickening along the minor fissure. No pneumothorax. Overlapping cardiac leads. Calcified aorta. IMPRESSION: Enlarged heart with vascular congestion and trace edema. Small left effusion. Electronically Signed   By: Ashok  Gupta M.D.   On: 04/29/2023 15:41    Telemetry    Non-tele  Cardiac Studies   2D Echocardiogram  6.2.2024   1. Left ventricular ejection fraction, by estimation, is 60 to 65%.  The  left ventricle has normal function. The left ventricle has no regional  wall motion abnormalities. There is mild concentric left ventricular  hypertrophy. Left ventricular diastolic  parameters are consistent with Grade II diastolic dysfunction  (pseudonormalization). Elevated left ventricular end-diastolic pressure.   2. Right ventricular systolic function is normal.   The right ventricular  size is normal. There is mildly elevated pulmonary artery systolic  pressure.   3. Left atrial size was moderately dilated.   4. The mitral valve is degenerative. No evidence of mitral valve  regurgitation. Mild mitral stenosis. The mean mitral valve gradient is 7.0  mmHg with average heart rate of 73 bpm. Moderate mitral annular  calcification.   5. The aortic valve is tricuspid. There is mild calcification of the  aortic valve. There is mild thickening of the aortic valve. Aortic valve  regurgitation is not visualized. Mild aortic valve stenosis. Aortic valve  area, by VTI measures 1.08 cm.  Aortic valve mean gradient measures 10.0 mmHg. Aortic valve Vmax measures  2.13 m/s.   6. The inferior vena cava is dilated in size with <50% respiratory  variability, suggesting right atrial pressure of 15 mmHg.   Patient Profile     67 y.o. 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   Assessment & Plan    1. Demand ischemia:  Presented 5/31 w/ progressive dyspnea.  No chest pain.  HsTrop 1678  1669  958  904  859.  Echo w/ nl LV fxn and w/o rwma.   Breathing slowly improving.  Lasix on hold.  No chest pain.  Eliquis held beginning June 3 and she is currently being bridged with heparin.  Plan  for diagnostic catheterization tomorrow.  Continue aspirin, beta-blocker, and statin therapy.  2.  Acute on chronic hypoxic respiratory failure/sleep apnea/OHS: Breathing slightly better today.  Echo with normal LV function, grade 2 diastolic dysfunction, mild MS/AS.  Lasix on hold.  Renal function relatively stable.  Still with mild ankle edema though body habitus otherwise makes exam very challenging.  Plan for right and left heart cardiac catheterization tomorrow.  3.  Essential hypertension: Blood pressures continue to trend in the 140s to 150s with at least 1 reading in the 180s.  Was on telmisartan at home.  Will resume.  Continue beta-blocker.  4.  Hyperlipidemia: LDL of 129.  Continue rosuvastatin.  5.  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  Currently on heparin with plan to resume Eliquis post catheterization.  6.  Type 2 diabetes mellitus: Poorly controlled.  A1c 9.1.  Per internal medicine.  7.  Normocytic anemia: Stable.  Signed, Derionna Salvador, NP  05/03/2023, 1:27 PM    For questions or updates, please contact   Please consult [email or web address] for contact info under Cardiology/STEMI.  

## 2023-05-03 NOTE — TOC Initial Note (Signed)
Transition of Care [facility]) - Initial/Assessment Note    Patient Details  Name: Sonya Sanchez MRN: [id number] Date of Birth: Apr 16, 1956  [facility]) CM/SW Contact:    Chapman Fitch, RN Phone Number: 05/03/2023, 4:36 PM  Clinical Narrative:                  Patient confirms plan is to return to [facility] at discharge [name] at [facility] confirms that patient is LTC.  [name] states they were not aware the nose piece was broken on the CPAP but they will have it replaced Fl2 sent for signature        Patient Goals and CMS Choice            Expected Discharge Plan and Services                                              Prior Living Arrangements/Services                       Activities of Daily Living Home Assistive Devices/Equipment: None ADL Screening (condition at time of admission) Patient's cognitive ability adequate to safely complete daily activities?: Yes Is the patient deaf or have difficulty hearing?: No Does the patient have difficulty seeing, even when wearing glasses/contacts?: No Does the patient have difficulty concentrating, remembering, or making decisions?: No Patient able to express need for assistance with ADLs?: Yes Does the patient have difficulty dressing or bathing?: Yes Independently performs ADLs?: No Communication: Independent Dressing (OT): Dependent Is this a change from baseline?: Pre-admission baseline Grooming: Dependent Is this a change from baseline?: Pre-admission baseline Feeding: Independent Bathing: Dependent Is this a change from baseline?: Pre-admission baseline Toileting: Dependent Is this a change from baseline?: Pre-admission baseline In/Out Bed: Dependent Is this a change from baseline?: Pre-admission baseline Walks in Home: Dependent Is this a change from baseline?: Pre-admission baseline Does the patient have difficulty walking or climbing stairs?:  Yes Weakness of Legs: Both Weakness of Arms/Hands: None  Permission Sought/Granted                  Emotional Assessment              Admission diagnosis:  Acute respiratory failure with hypoxia and hypercapnia (HCC) [J96.01, J96.02] Acute on chronic hypoxic respiratory failure (HCC) [J96.21] Patient Active Problem List   Diagnosis Date Noted   Acute heart failure with preserved ejection fraction (HCC) 05/02/2023   Acute respiratory failure with hypoxia and hypercapnia (HCC) 04/30/2023   Acute metabolic encephalopathy 04/30/2023   Acute on chronic hypoxic respiratory failure (HCC) 04/29/2023   Bedbound 04/29/2023   Adult onset dermatomyositis (HCC) 04/29/2023   Altered mental status 04/29/2023   Irritable bowel syndrome with constipation 05/11/2019   Post-nasal drip 02/21/2019   Allergic rhinitis 02/21/2019   Cataracts, bilateral 01/27/2018   Uncontrolled type 2 diabetes mellitus with hyperglycemia (HCC) 01/19/2018   Bronchitis 12/22/2016   OSA (obstructive sleep apnea) 12/08/2016   Elevated troponin 08/11/2015   Poorly controlled type 2 diabetes mellitus with circulatory disorder (HCC)    Aortic valve stenosis 01/30/2014   Chronic venous insufficiency 08/03/2013   Pulmonary embolism (HCC) 03/21/2013   DVT (deep venous thrombosis) (HCC) 03/21/2013   Hyperlipidemia 02/13/2013   Morbid obesity (HCC) 02/13/2013   Essential hypertension 12/25/2012  PCP:  Pcp, No Pharmacy:   [id number] [place], [place] - [address] AT [facility] OF [address] [place] [address] Phone: [phone] Fax: [phone]  [id number] - [place], [place] - [address] AT [facility] [address] [place] [address] Phone: [phone] Fax: [phone]     Social Determinants of Health (SDOH) Social History: SDOH Screenings   Food Insecurity: No Food Insecurity (04/30/2023)   Housing: Low Risk  (04/30/2023)  Transportation Needs: No Transportation Needs (04/30/2023)  Utilities: Not At Risk (04/30/2023)  Tobacco Use: Medium Risk (04/29/2023)   SDOH Interventions:     Readmission Risk Interventions     No data to display

## 2023-05-03 NOTE — Progress Notes (Signed)
Progress Note   Patient: Sonya Sanchez ZOX:[id number] DOB: [birth year] DOA: 04/29/2023     4 DOS: the patient was seen and examined on 05/03/2023   Brief hospital course: Ms.Sonya Sanchez is a 67 year old female with history of insulin-dependent diabetes mellitus, GERD, hypertension, hyperlipidemia, morbid obesity, OSA on CPAP, history of DVT, PE on anticoagulation, chronic respiratory failure on 2 L nasal cannula at baseline, dermatomyositis, baseline bedbound (has not walked for 3 years) prior smoking history, presented to emergency department for chief concerns of respiratory distress.  Patient is not wearing CPAP at the facility.  She was more lethargic upon presentation, troponins elevated, ABG showed high CO2, low oxygen.  Patient was on BiPAP in the ED and gradually weaned to 2 L supplemental oxygen.  She is started on Lasix, DuoNebs, heparin drip admitted to stepdown unit. She is started on IV steroids, Lasix, her Bipap gradually weaned off, now on supplemental oxygen. Cardiology team evaluated her for elevated troponin advised Cardiac cath which is scheduled for tomorrow. Eliquis changed to heparin drip.  Assessment and Plan: * Acute on chronic hypoxic and hypercapnic respiratory failure (HCC) Possibly multifactorial in the setting of CHF exacerbation, noncompliance with CPAP, sedatives use. Off Bipap, continue CPAP at night. Continue oral Lasix home dose. Strict I's and O's. Continue oral prednisone for another day. Continue DuoNebs, supplemental oxygen to maintain saturation greater than 90%. Encourage incentive spirometry.  Lethargy/ metabolic encephalopathy Due to metabolic derangements including hypercarbia, hypoxia. Bipap weaned off. Continue supplemental oxygen. Mental status improved. Advised to avoid sedatives, opiates, benzos.  Elevated troponin- Possibly demand ischemia due to acute on chronic hypercapnic, hypoxic respiratory failure. No chest pain. Trop trended  down. Cardiology plan to do R/L heart cath 05/04/23. Restarted heparin, hold home dose Eliquis. Echo reviewed. Grade 2 diastolic dysfunction.  Poorly controlled type 2 diabetes mellitus with circulatory disorder (HCC) Insulin-dependent diabetes mellitus Insulin sliding scale per protocol. Sugars high as she is on steroids. Lantus increased to 42, mealtime 4 units aspart started.   H/o DVT - resume heparin for Eliquis.  Adult onset dermatomyositis (HCC) Continue Azathioprine.  Bedbound Poor functional status- PT/ OT evaluation. Dispo to SNF  Incentive spirometry.  OSA (obstructive sleep apnea) CPAP at night, says she had good fitting mask last night.  Morbid obesity (HCC) This complicates overall care and prognosis.  Diet, exercise and weight reduction advised.  PT/ OT evaluation. Fall, aspiration precautions. DVT porphylaxis - heparin drip    Subjective: Patient is seen and examined today morning.  She is lying in bed.  She is on 3L supplemental oxygen. Tolerated CPAP well last night. Denies any complaints.    Physical Exam: Vitals:   05/03/23 0335 05/03/23 0742 05/03/23 1440 05/03/23 1554  BP: (!) 150/59 (!) 182/55 (!) 152/59 (!) 153/57  Pulse: (!) 57 61 60 60  Resp: 18 18  16   Temp: 99 F (37.2 C) 98.2 F (36.8 C)  98.4 F (36.9 C)  TempSrc: Oral   Oral  SpO2: 96% 93%  93%  Weight:      Height:       General-elderly morbidly obese Caucasian female, mild respiratory distress. HEENT-PERRLA EOMI, nontender sinuses. Heart-S1-S2 heard, no murmurs.  Trace pedal edema. Lungs-distant breath sounds.  Diffuse rhonchi noted. Abdomen-soft, nontender, bowel sounds good. Neuro-awake oriented x 3, lower extremity weakness, nonfocal.  Data Reviewed:  CBC Hb 10.3, APTT 66, BMP cr 1.08. blood sugars 180-200  Family Communication: Discussed with patient she understands and agrees.  Disposition: Status is: Inpatient Remains  inpatient appropriate because: respiratory  failure, need for cardiac cath.  Planned Discharge Destination: Skilled nursing facility    MDM level 3- Patient admitted with respiratory failure, is on high risk medication including heparin drip, need close hemodynamic, telemetry, neurologic monitoring. She is at high risk for sudden clinical deterioration.  Author: Marcelino Duster, MD 05/03/2023 5:18 PM  For on call review [email or web address].

## 2023-05-03 NOTE — Plan of Care (Signed)
  Problem: Coping: Goal: Ability to adjust to condition or change in health will improve Outcome: Progressing   Problem: Metabolic: Goal: Ability to maintain appropriate glucose levels will improve Outcome: Progressing   

## 2023-05-03 NOTE — Consult Note (Signed)
ANTICOAGULATION CONSULT NOTE  Pharmacy Consult for IV Heparin Indication: pulmonary embolus  Patient Measurements: Height: 5\' 2"  (157.5 cm) Weight: 133 kg (293 lb 3.4 oz) IBW/kg (Calculated) : 50.1 Heparin Dosing Weight: 83.7  Labs: Recent Labs    04/30/23 1300 04/30/23 1437 05/01/23 0401 05/01/23 0908 05/01/23 1221 05/02/23 0417 05/03/23 0421  HGB  --    < > 9.8*  --   --  10.8* 10.3*  HCT  --   --  32.0*  --   --  34.0* 32.0*  PLT  --   --  180  --   --  207 200  APTT 65*   < > 114*  --  73*  --  56*  HEPARINUNFRC  --   --  >1.10*  --   --   --  >1.10*  CREATININE 1.09*  --  1.14*  --   --  1.12*  --   TROPONINIHS 1,678*   < > 958* 904* 859*  --   --    < > = values in this interval not displayed.    Estimated Creatinine Clearance: 62.3 mL/min (A) (by C-G formula based on SCr of 1.12 mg/dL (H)).  Medical History: Past Medical History:  Diagnosis Date   Calculus of kidney    Diabetes mellitus without complication (HCC)    DVT (deep venous thrombosis) (HCC)    a. on Eliquis   Family history of early CAD    a. parents passing in their 87's from CAD   Gout, unspecified    Heart murmur    Hyperlipidemia    Hypertension    Morbid obesity (HCC)    Normal cardiac stress test    a. equivocal study, sig soft tissue artifact present, no chest discomfort or ECG changes, perfusion images suggest mod sized region of mild reversible perfusion defect. Findings may be 2/2 shifting soft tissue attenuation, but cannot r/o ischemia, EF 72%   Phlebitis and thrombophlebitis of other deep vessels of lower extremities    Phlebitis and thrombophlebitis of other deep vessels of lower extremities    Pulmonary emboli (HCC)    a. on Eliquis   Spinal stenosis, unspecified region other than cervical    Tobacco abuse    Type II or unspecified type diabetes mellitus without mention of complication, uncontrolled    Unspecified sleep apnea    Urinary tract infection, site not specified      Medications:  Holding apixaban 5 mg BID pending cardiac catheterization. Heparin peri-operatively  Assessment: 67 y/o F with medical history as above and including hx VTE on apixaban here with acute on chronic respiratory failure in setting of CHF exacerbation, polypharmacy, CPAP non-compliance. Troponin elevated and plan is for ischemic work-up with cardiac catheterization 6/5. Pharmacy consulted for heparin management.  Patient was previously on heparin drip this admission 5/31 - 6/2 and was therapeutic at 1550 units/hr  Goal of Therapy:  Heparin level 0.3-0.7 units/ml aPTT 66 - 102 seconds Monitor platelets by anticoagulation protocol: Yes   Plan:  6/4 @ 0421: aPTT = 56,  HL = >1.10 - aPTT SUBtherapeutic,  HL still elevated from PTA Eliquis - will order heparin 1250 units IV X 1 bolus and increase drip rate to 1700 units/hr.  - Will recheck aPTT 6 hrs after rate change - Will recheck HL on 6/5  with AM labs.  --Follow aPTT given interference of apixaban on heparin level. Daily heparin level until correlation established and then switch over to heparin level  monitoring --Daily CBC per protocol while on IV heparin  Suvan Stcyr D 05/03/2023,5:31 AM

## 2023-05-04 ENCOUNTER — Other Ambulatory Visit (HOSPITAL_COMMUNITY): Payer: Self-pay

## 2023-05-04 ENCOUNTER — Encounter
Admission: EM | Disposition: A | Discharge status: Skilled Nursing Facility | Payer: Self-pay | Source: Skilled Nursing Facility | Attending: Internal Medicine

## 2023-05-04 DIAGNOSIS — Z794 Long term (current) use of insulin: Secondary | ICD-10-CM

## 2023-05-04 DIAGNOSIS — I214 Non-ST elevation (NSTEMI) myocardial infarction: Secondary | ICD-10-CM

## 2023-05-04 DIAGNOSIS — E1165 Type 2 diabetes mellitus with hyperglycemia: Secondary | ICD-10-CM

## 2023-05-04 DIAGNOSIS — I2510 Atherosclerotic heart disease of native coronary artery without angina pectoris: Secondary | ICD-10-CM | POA: Diagnosis not present

## 2023-05-04 DIAGNOSIS — E782 Mixed hyperlipidemia: Secondary | ICD-10-CM

## 2023-05-04 DIAGNOSIS — J9621 Acute and chronic respiratory failure with hypoxia: Secondary | ICD-10-CM | POA: Diagnosis not present

## 2023-05-04 HISTORY — PX: LEFT HEART CATH AND CORONARY ANGIOGRAPHY: CATH118249

## 2023-05-04 LAB — BASIC METABOLIC PANEL
Anion gap: 11 (ref 5–15)
BUN: 64 mg/dL — ABNORMAL HIGH (ref 8–23)
CO2: 32 mmol/L (ref 22–32)
Calcium: 9 mg/dL (ref 8.9–10.3)
Chloride: 97 mmol/L — ABNORMAL LOW (ref 98–111)
Creatinine, Ser: 1 mg/dL (ref 0.44–1.00)
GFR, Estimated: >60 mL/min (ref >60)
Glucose, Bld: 173 mg/dL — ABNORMAL HIGH (ref 70–99)
Potassium: 3.9 mmol/L (ref 3.5–5.1)
Sodium: 140 mmol/L (ref 135–145)

## 2023-05-04 LAB — CULTURE, BLOOD (ROUTINE X 2)
Culture: NO GROWTH
Culture: NO GROWTH

## 2023-05-04 LAB — CBC
HCT: 32.5 % — ABNORMAL LOW (ref 36.0–46.0)
Hemoglobin: 10.3 g/dL — ABNORMAL LOW (ref 12.0–15.0)
MCH: 29.1 pg (ref 26.0–34.0)
MCHC: 31.7 g/dL (ref 30.0–36.0)
MCV: 91.8 fL (ref 80.0–100.0)
Platelets: 188 10*3/uL (ref 150–400)
RBC: 3.54 MIL/uL — ABNORMAL LOW (ref 3.87–5.11)
RDW: 18.6 % — ABNORMAL HIGH (ref 11.5–15.5)
WBC: 9.5 10*3/uL (ref 4.0–10.5)
nRBC: 0 % (ref 0.0–0.2)

## 2023-05-04 LAB — GLUCOSE, CAPILLARY
Glucose-Capillary: 174 mg/dL — ABNORMAL HIGH (ref 70–99)
Glucose-Capillary: 187 mg/dL — ABNORMAL HIGH (ref 70–99)
Glucose-Capillary: 214 mg/dL — ABNORMAL HIGH (ref 70–99)
Glucose-Capillary: 233 mg/dL — ABNORMAL HIGH (ref 70–99)

## 2023-05-04 LAB — HEPARIN LEVEL (UNFRACTIONATED): Heparin Unfractionated: >1.1 IU/mL — ABNORMAL HIGH (ref 0.30–0.70)

## 2023-05-04 LAB — APTT: aPTT: 90 seconds — ABNORMAL HIGH (ref 24–36)

## 2023-05-04 SURGERY — LEFT HEART CATH AND CORONARY ANGIOGRAPHY
Anesthesia: Moderate Sedation

## 2023-05-04 MED ORDER — HYDRALAZINE HCL 20 MG/ML IJ SOLN
10.0000 mg | INTRAMUSCULAR | Status: AC | PRN
  Filled 2023-05-04: qty 1

## 2023-05-04 MED ORDER — HEPARIN (PORCINE) IN NACL 1000-0.9 UT/500ML-% IV SOLN
INTRAVENOUS | Status: DC | PRN
  Administered 2023-05-04 (×2): 500 mL

## 2023-05-04 MED ORDER — SODIUM CHLORIDE 0.9% FLUSH: 3.0000 mL | INTRAVENOUS | Status: DC | PRN

## 2023-05-04 MED ORDER — SODIUM CHLORIDE 0.9 % IV SOLN: 250.0000 mL | INTRAVENOUS | Status: DC | PRN

## 2023-05-04 MED ORDER — FENTANYL CITRATE (PF) 100 MCG/2ML IJ SOLN
INTRAMUSCULAR | Status: AC
  Filled 2023-05-04: qty 2

## 2023-05-04 MED ORDER — HYDRALAZINE HCL 20 MG/ML IJ SOLN
INTRAMUSCULAR | Status: AC
  Filled 2023-05-04: qty 1

## 2023-05-04 MED ORDER — APIXABAN 5 MG PO TABS: 5.0000 mg | ORAL_TABLET | Freq: Two times a day (BID) | ORAL | Status: DC

## 2023-05-04 MED ORDER — ACETAMINOPHEN 325 MG PO TABS
650.0000 mg | ORAL_TABLET | ORAL | Status: DC | PRN
  Administered 2023-05-04: 650 mg via ORAL
  Filled 2023-05-04: qty 2

## 2023-05-04 MED ORDER — APIXABAN 5 MG PO TABS
5.0000 mg | ORAL_TABLET | Freq: Two times a day (BID) | ORAL | Status: DC
  Administered 2023-05-05: 5 mg via ORAL
  Filled 2023-05-04 (×2): qty 1

## 2023-05-04 MED ORDER — HEPARIN SODIUM (PORCINE) 1000 UNIT/ML IJ SOLN
INTRAMUSCULAR | Status: AC
  Filled 2023-05-04: qty 10

## 2023-05-04 MED ORDER — SODIUM CHLORIDE 0.9% FLUSH
3.0000 mL | Freq: Two times a day (BID) | INTRAVENOUS | Status: DC
  Administered 2023-05-04 (×2): 3 mL via INTRAVENOUS

## 2023-05-04 MED ORDER — IOHEXOL 300 MG/ML  SOLN
INTRAMUSCULAR | Status: DC | PRN
  Administered 2023-05-04: 45 mL

## 2023-05-04 MED ORDER — HEPARIN SODIUM (PORCINE) 1000 UNIT/ML IJ SOLN
INTRAMUSCULAR | Status: DC | PRN
  Administered 2023-05-04: 5000 [IU] via INTRAVENOUS

## 2023-05-04 MED ORDER — VERAPAMIL HCL 2.5 MG/ML IV SOLN
INTRAVENOUS | Status: DC | PRN
  Administered 2023-05-04 (×2): 2.5 mg via INTRA_ARTERIAL

## 2023-05-04 MED ORDER — KETOROLAC TROMETHAMINE 15 MG/ML IJ SOLN
15.0000 mg | Freq: Four times a day (QID) | INTRAMUSCULAR | Status: DC | PRN
  Filled 2023-05-04: qty 1

## 2023-05-04 MED ORDER — MIDAZOLAM HCL 2 MG/2ML IJ SOLN
INTRAMUSCULAR | Status: AC
  Filled 2023-05-04: qty 2

## 2023-05-04 MED ORDER — VERAPAMIL HCL 2.5 MG/ML IV SOLN
INTRAVENOUS | Status: AC
  Filled 2023-05-04: qty 2

## 2023-05-04 MED ORDER — HEPARIN (PORCINE) IN NACL 1000-0.9 UT/500ML-% IV SOLN
INTRAVENOUS | Status: AC
  Filled 2023-05-04: qty 1000

## 2023-05-04 MED ORDER — FUROSEMIDE 10 MG/ML IJ SOLN
40.0000 mg | Freq: Every day | INTRAMUSCULAR | Status: DC
  Administered 2023-05-04: 40 mg via INTRAVENOUS
  Filled 2023-05-04: qty 4

## 2023-05-04 SURGICAL SUPPLY — 16 items
CATH BALLN WEDGE 5F 110CM (CATHETERS) IMPLANT
CATH INFINITI 5FR ANG PIGTAIL (CATHETERS) IMPLANT
CATH INFINITI AMBI 5FR TG (CATHETERS) IMPLANT
DEVICE RAD TR BAND REGULAR (VASCULAR PRODUCTS) IMPLANT
DRAPE BRACHIAL (DRAPES) IMPLANT
GLIDESHEATH SLEND SS 6F .021 (SHEATH) IMPLANT
GUIDEWIRE .025 260CM (WIRE) IMPLANT
GUIDEWIRE INQWIRE 1.5J.035X260 (WIRE) IMPLANT
INQWIRE 1.5J .035X260CM (WIRE) ×1
PACK CARDIAC CATH (CUSTOM PROCEDURE TRAY) ×1 IMPLANT
PANNUS RETENTION SYSTEM 2 PAD (MISCELLANEOUS) IMPLANT
PROTECTION STATION PRESSURIZED (MISCELLANEOUS) ×1
SET ATX-X65L (MISCELLANEOUS) IMPLANT
SHEATH GLIDE SLENDER 4/5FR (SHEATH) IMPLANT
STATION PROTECTION PRESSURIZED (MISCELLANEOUS) IMPLANT
WIRE HITORQ VERSACORE ST 145CM (WIRE) IMPLANT

## 2023-05-04 NOTE — Progress Notes (Signed)
Cardiology Progress Note   Patient Name: Sonya Sanchez Date of Encounter: 05/04/2023  Primary Cardiologist: Julien Nordmann, MD  Subjective   Breathing stable this morning.  Lying relatively flat in bed.  No chest pain.  N.p.o. for diagnostic catheterization.  Inpatient Medications    Scheduled Meds:  aspirin  81 mg Oral Daily   azaTHIOprine  100 mg Oral BID   fluticasone furoate-vilanterol  1 puff Inhalation Daily   And   umeclidinium bromide  1 puff Inhalation Daily   insulin aspart  0-20 Units Subcutaneous TID WC   insulin aspart  0-5 Units Subcutaneous QHS   insulin aspart  4 Units Subcutaneous TID WC   insulin glargine-yfgn  42 Units Subcutaneous QHS   irbesartan  300 mg Oral Daily   metoprolol succinate  25 mg Oral Daily   multivitamin with minerals  1 tablet Oral Daily   pantoprazole  40 mg Oral QAC breakfast   rosuvastatin  20 mg Oral Daily   sodium chloride flush  3 mL Intravenous Q12H   spironolactone  25 mg Oral Daily   Continuous Infusions:  sodium chloride     sodium chloride     heparin 1,700 Units/hr (05/04/23 0423)   PRN Meds: sodium chloride, acetaminophen **OR** acetaminophen, ipratropium-albuterol, ondansetron **OR** ondansetron (ZOFRAN) IV, senna-docusate, sodium chloride flush   Vital Signs    Vitals:   05/03/23 2324 05/04/23 0100 05/04/23 0837 05/04/23 1157  BP: (!) 157/47  (!) 134/52 (!) 147/48  Pulse: (!) 55  (!) 59 (!) 58  Resp: 20 16 16 18   Temp: 97.9 F (36.6 C)  98.2 F (36.8 C) 98.4 F (36.9 C)  TempSrc: Oral     SpO2: 98%  96% 95%  Weight:      Height:        Intake/Output Summary (Last 24 hours) at 05/04/2023 1212 Last data filed at 05/04/2023 1049 Gross per 24 hour  Intake 1023 ml  Output 1450 ml  Net -427 ml   Filed Weights   04/29/23 1503  Weight: 133 kg    Physical Exam   GEN: Obese, in no acute distress.  HEENT: Grossly normal.  Neck: Supple, obese, difficult to gauge JVP.  No bruits or masses.   Cardiac: RRR, 3/6  systolic murmur throughout, no rubs or gallops.  No clubbing, cyanosis, trace bilateral lower extremity edema.  Radials 2+, DP/PT 2+ and equal bilaterally.  Respiratory:  Respirations regular and unlabored, diminished breath sounds at bilateral bases. GI: Obese, nontender, nondistended, BS + x 4. MS: no deformity or atrophy. Skin: warm and dry, no rash. Neuro:  Strength and sensation are intact. Psych: AAOx3.  Normal affect.  Labs    Chemistry Recent Labs  Lab 04/29/23 1507 04/30/23 1300 05/03/23 0421 05/03/23 1417 05/04/23 0549  NA 143   < > 139 139 140  K 4.5   < > 4.1 4.5 3.9  CL 98   < > 94* 96* 97*  CO2 28   < > 32 32 32  GLUCOSE 338*   < > 225* 249* 173*  BUN 49*   < > 71* 71* 64*  CREATININE 1.04*   < > 1.16* 1.08* 1.00  CALCIUM 9.1   < > 9.1 9.3 9.0  PROT 7.2  --   --   --   --   ALBUMIN 3.4*  --   --   --   --   AST 15  --   --   --   --  ALT 8  --   --   --   --   ALKPHOS 56  --   --   --   --   BILITOT 0.8  --   --   --   --   GFRNONAA 58*   < > 51* 56* >60  ANIONGAP 17*   < > 13 11 11    < > = values in this interval not displayed.     Hematology Recent Labs  Lab 05/02/23 0417 05/03/23 0421 05/04/23 0549  WBC 7.8 7.7 9.5  RBC 3.72* 3.52* 3.54*  HGB 10.8* 10.3* 10.3*  HCT 34.0* 32.0* 32.5*  MCV 91.4 90.9 91.8  MCH 29.0 29.3 29.1  MCHC 31.8 32.2 31.7  RDW 18.5* 18.6* 18.6*  PLT 207 200 188    Cardiac Enzymes  Recent Labs  Lab 04/30/23 1300 04/30/23 1437 05/01/23 0401 05/01/23 0908 05/01/23 1221  TROPONINIHS 1,678* 1,669* 958* 904* 859*      BNP    Component Value Date/Time   BNP 386.8 (H) 04/29/2023 1507    ProBNP    Component Value Date/Time   PROBNP 17.0 12/08/2016 0913   Lipids  Lab Results  Component Value Date   CHOL 196 05/03/2023   HDL 26 (L) 05/03/2023   LDLCALC 129 (H) 05/03/2023   LDLDIRECT 138.5 02/13/2013   TRIG 206 (H) 05/03/2023   CHOLHDL 7.5 05/03/2023    HbA1c  Lab Results  Component Value Date    HGBA1C 9.1 (H) 04/30/2023    Radiology   ---------   Telemetry    Sinus rhythm, sinus bradycardia, occasional PVCs- Personally Reviewed  Cardiac Studies   2D Echocardiogram  6.2.2024   1. Left ventricular ejection fraction, by estimation, is 60 to 65%. The  left ventricle has normal function. The left ventricle has no regional  wall motion abnormalities. There is mild concentric left ventricular  hypertrophy. Left ventricular diastolic  parameters are consistent with Grade II diastolic dysfunction  (pseudonormalization). Elevated left ventricular end-diastolic pressure.   2. Right ventricular systolic function is normal. The right ventricular  size is normal. There is mildly elevated pulmonary artery systolic  pressure.   3. Left atrial size was moderately dilated.   4. The mitral valve is degenerative. No evidence of mitral valve  regurgitation. Mild mitral stenosis. The mean mitral valve gradient is 7.0  mmHg with average heart rate of 73 bpm. Moderate mitral annular  calcification.   5. The aortic valve is tricuspid. There is mild calcification of the  aortic valve. There is mild thickening of the aortic valve. Aortic valve  regurgitation is not visualized. Mild aortic valve stenosis. Aortic valve  area, by VTI measures 1.08 cm.  Aortic valve mean gradient measures 10.0 mmHg. Aortic valve Vmax measures  2.13 m/s.   6. The inferior vena cava is dilated in size with <50% respiratory  variability, suggesting right atrial pressure of 15 mmHg.   Patient Profile     67 y.o. 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   Assessment & Plan    1. Demand ischemia:   Presented 5/31 w/ progressive dyspnea.  No chest pain.  HsTrop 1678  1669  958  904  859.  Echo w/ nl LV fxn and w/o rwma.   Breathing continues to improve.  Lasix has been on hold for the past 2 days.  No chest pain.  Eliquis on hold with plan for diagnostic catheterization today.  Continue aspirin, beta-blocker, and statin therapy.  2.  Acute on chronic hypoxic respiratory failure/sleep apnea/OHS: Breathing continues to improve.   Echo with normal LV function, grade 2 diastolic dysfunction, mild MS/AS.  Lasix on hold.  Renal function stable.  Plan for right and left heart cath today.  3.  Essential hypertension: Pressures trending a little bit better, now more like 130s to 140s.  ARB therapy resumed yesterday.  Continue beta-blocker as well.  4.  Hyperlipidemia: LDL of 129.  Continue rosuvastatin.  5.  History of DVT/PE: On chronic Eliquis, which was held following a.m. dose on June 3.  She has been on heparin precath with plan to resume Eliquis post cath.  6.  Type 2 diabetes mellitus: Poorly controlled.  A1c 9.1.  Per internal medicine.  7.  Normocytic anemia: This has been stable.  Signed, Nicolasa Ducking, NP  05/04/2023, 12:12 PM    For questions or updates, please contact   Please consult [email or web address] for contact info under Cardiology/STEMI.

## 2023-05-04 NOTE — Inpatient Diabetes Management (Signed)
Inpatient Diabetes Program Recommendations  AACE/ADA: New Consensus Statement on Inpatient Glycemic Control (2015)  Target Ranges:  Prepandial:   less than 140 mg/dL      Peak postprandial:   less than 180 mg/dL (1-2 hours)      Critically ill patients:  140 - 180 mg/dL    Latest Reference Range & Units 05/03/23 07:44 05/03/23 11:53 05/03/23 16:40 05/03/23 21:16  Glucose-Capillary 70 - 99 mg/dL 161 (H)  8 units Novolog @0930  188 (H)  4 units Novolog @1320  225 (H)  7 units Novolog  235 (H)  2 units Novolog  42 units Semglee  (H): Data is abnormally high  Latest Reference Range & Units 05/04/23 08:38 05/04/23 11:56  Glucose-Capillary 70 - 99 mg/dL 096 (H)  8 units Novolog  187 (H)  (H): Data is abnormally high   SNF DM Meds: Several Meds listed--called the SNF 6/3 and got clarification: Lantus 40 units QHS + Fiasp Insulin TID per SSI + Jardiance 10 mg daily     Current Orders: Semglee 42 units QHS     Novolog 0-20 units TID ac/hs    Novolog 4 units TID with meals     Prednisone 20 mg Daily    MD- Please consider:  1. Increase Semglee slightly to 44 units QHS  2. Increase Novolog Meal Coverage to 6 units TID with meals     --Will follow patient during hospitalization--  Ambrose Finland RN, MSN, CDCES Diabetes Coordinator Inpatient Glycemic Control Team Team Pager: [phone] (8a-5p)

## 2023-05-04 NOTE — Progress Notes (Signed)
Progress Note   Patient: Sonya Sanchez ZOX:[id number] DOB: 10-29-1956 DOA: 04/29/2023     5 DOS: the patient was seen and examined on 05/04/2023   Brief hospital course: Sonya Sanchez is a 68 year old female with history of insulin-dependent diabetes mellitus, GERD, hypertension, hyperlipidemia, morbid obesity, OSA on CPAP, history of DVT, PE on anticoagulation, chronic respiratory failure on 2 L nasal cannula at baseline, dermatomyositis, baseline bedbound (has not walked for 3 years) prior smoking history, who presents emergency department for chief concerns of respiratory distress.  Patient was brought in from EMS via [facility].  Per report, patient SpO2 was 60% on 2 L nasal cannula.  Vitals in the ED showed temperature of 98.1, respiration rate of 14, heart rate 93, blood pressure 153/50, SpO2 of 94% on BiPAP.  Serum sodium is 143, potassium 4.5, 98, bicarb 28, BUN of 49, serum creatinine 1.04, EGFR 58, nonfasting blood glucose 338, WBC 11.4, hemoglobin 10.9, platelets of 200.  Procalcitonin was 0.18.  But BNP was 386.8.  High sensitive troponin is 40.  COVID PCR was negative.  ED treatment: Furosemide 60 mg IV one-time dose, insulin aspart 10 units, DuoNebs x 2 treatments.  Assessment and Plan: * Acute on chronic hypoxic and hypercapnic respiratory failure (HCC) Possibly multifactorial in the setting of CHF exacerbation, noncompliance with CPAP, sedatives use. Off Bipap, continue CPAP at night and oxygen supplementation during the day at 3 L Continue Lasix Strict I's and O's. Continue oral prednisone for another day. Continue DuoNebs, supplemental oxygen to maintain saturation greater than 90%. Encourage incentive spirometry.   Acute on chronic diastolic dysfunction CHF 2D echocardiogram shows an LVEF of 60 to 65% with mild concentric LVH and grade 2 diastolic dysfunction.  Noted to have elevated left ventricular end-diastolic pressure. Continue Lasix, Avapro and  metoprolol Maintain low-sodium diet   Lethargy/ metabolic encephalopathy Due to metabolic derangements including hypercarbia, hypoxia. Bipap weaned off. Continue supplemental oxygen. Mental status improved. Advised to avoid sedatives, opiates, benzos.   Elevated troponin- Possibly demand ischemia due to acute on chronic hypercapnic, hypoxic respiratory failure. No chest pain.  Trop trended down. Status post left heart cath which showed severe single-vessel coronary artery disease with chronic total occlusion of distal RCA with PDA and PL branches filling via left-to-right collaterals.There is 70-80% stenosis of small-moderate first diagonal branch that is not well-suited to PCI.  Echo reviewed. Grade 2 diastolic dysfunction with moderate aortic stenosis We will discontinue heparin in a.m. and start patient back on Eliquis    Poorly controlled type 2 diabetes mellitus with circulatory disorder (HCC) Insulin-dependent diabetes mellitus Insulin sliding scale per protocol. Improved glycemic control Continue Lantus increased to 42, mealtime 4 units aspart started.    H/o DVT -on IV heparin for Eliquis.   Adult onset dermatomyositis (HCC) Continue Azathioprine.   Bedbound Poor functional status- PT/ OT evaluation. Dispo to SNF  Incentive spirometry.   OSA (obstructive sleep apnea) CPAP at night, says she had good fitting mask last night.   Morbid obesity (HCC) BMI 53.63 kg/m2 This complicates overall care and prognosis.  Diet, exercise and weight reduction advised.   PT/ OT evaluation. Fall, aspiration precautions. DVT porphylaxis - heparin drip        Subjective: Patient is seen and examined at the bedside.  Has no new complaints  Physical Exam: Vitals:   05/04/23 1500 05/04/23 1515 05/04/23 1530 05/04/23 1545  BP: (!) 170/65 (!) 144/54 (!) 141/52 (!) 147/49  Pulse: 68 (!) 58 60 (!) 54  Resp:  15 16 16 16   Temp:      TempSrc:      SpO2: 93% 92% 93% 95%  Weight:       Height:       General-elderly morbidly obese Caucasian female, mild respiratory distress. HEENT-PERRLA EOMI, nontender sinuses. Heart-S1-S2 heard, no murmurs.  Trace pedal edema. Lungs-distant breath sounds.  Diffuse rhonchi noted. Abdomen-soft, nontender, bowel sounds good. Neuro-awake oriented x 3, lower extremity weakness, nonfocal.  Data Reviewed: Labs reviewed.  Stable There are no new results to review at this time.  Family Communication:   Disposition: Status is: Inpatient Remains inpatient appropriate because: IV diuresis for acute on chronic diastolic dysfunction CHF  Planned Discharge Destination: Skilled nursing facility    Time spent: 35 minutes  Author: Lucile Shutters, MD 05/04/2023 3:58 PM  For on call review [email or web address].

## 2023-05-04 NOTE — TOC Benefit Eligibility Note (Signed)
Patient Product/process development scientist completed.    The patient is currently admitted and upon discharge could be taking Ozempic 2 mg/3 ml Sopn.  The current 30 day co-pay is $0.00.   The patient is insured through [employer]   This test claim was processed through [facility]- copay amounts may vary at other pharmacies due to pharmacy/plan contracts, or as the patient moves through the different stages of their insurance plan.  Roland Earl, CPHT Pharmacy Patient Advocate Specialist [facility] Patient Advocate Team Direct Number: [phone]  Fax: [phone]

## 2023-05-04 NOTE — Consult Note (Signed)
ANTICOAGULATION CONSULT NOTE  Pharmacy Consult for IV Heparin Indication: pulmonary embolus  Patient Measurements: Height: 5\' 2"  (157.5 cm) Weight: 133 kg (293 lb 3.4 oz) IBW/kg (Calculated) : 50.1 Heparin Dosing Weight: 83.7  Labs: Recent Labs     0000 05/01/23 0908 05/01/23 1221 05/02/23 0417 05/02/23 0417 05/03/23 0421 05/03/23 1242 05/03/23 1417 05/03/23 1904 05/04/23 0549  HGB  --   --   --  10.8*   < > 10.3*  --   --   --  10.3*  HCT  --   --   --  34.0*  --  32.0*  --   --   --  32.5*  PLT  --   --   --  207  --  200  --   --   --  188  APTT   < >  --  73*  --   --  56* 66*  --  70* 90*  HEPARINUNFRC  --   --   --   --   --  >1.10*  --   --   --  >1.10*  CREATININE  --   --   --  1.12*   < > 1.16*  --  1.08*  --  1.00  TROPONINIHS  --  904* 859*  --   --   --   --   --   --   --    < > = values in this interval not displayed.    Estimated Creatinine Clearance: 69.8 mL/min (by C-G formula based on SCr of 1 mg/dL).  Medical History: Past Medical History:  Diagnosis Date   Calculus of kidney    Diabetes mellitus without complication (HCC)    DVT (deep venous thrombosis) (HCC)    a. on Eliquis   Family history of early CAD    a. parents passing in their 83's from CAD   Gout, unspecified    Heart murmur    Hyperlipidemia    Hypertension    Morbid obesity (HCC)    Normal cardiac stress test    a. equivocal study, sig soft tissue artifact present, no chest discomfort or ECG changes, perfusion images suggest mod sized region of mild reversible perfusion defect. Findings may be 2/2 shifting soft tissue attenuation, but cannot r/o ischemia, EF 72%   Phlebitis and thrombophlebitis of other deep vessels of lower extremities    Phlebitis and thrombophlebitis of other deep vessels of lower extremities    Pulmonary emboli (HCC)    a. on Eliquis   Spinal stenosis, unspecified region other than cervical    Tobacco abuse    Type II or unspecified type diabetes mellitus  without mention of complication, uncontrolled    Unspecified sleep apnea    Urinary tract infection, site not specified     Medications:  Holding apixaban 5 mg BID pending cardiac catheterization. Heparin peri-operatively  Assessment: 67 y/o F with medical history as above and including hx VTE on apixaban here with acute on chronic respiratory failure in setting of CHF exacerbation, polypharmacy, CPAP non-compliance. Troponin elevated and plan is for ischemic work-up with cardiac catheterization 6/5. Pharmacy consulted for heparin management.  Patient was previously on heparin drip this admission 5/31 - 6/2 and was therapeutic at 1550 units/hr  0604 0421 aPTT 56, HL > 1.1; 1550 un/hr 0604 1242 aPTT 66; 1700 un/hr 0604 1904 aPTT 70; 1700 un/hr 0605 0549 aPTT 90; HL > 1.10  Goal of Therapy:  Heparin level 0.3-0.7  units/ml aPTT 66 - 102 seconds Monitor platelets by anticoagulation protocol: Yes   Plan:  0605 @ 0549:  aPTT = 90, HL = >1.10 --aPTT is therapeutic x 3, HL still elevated from Eliquis PTA --Continue heparin infusion at current rate of 1700 units/hr --Will check aPTT and HL with next AM labs Follow aPTT given interference of apixaban on heparin level. Daily heparin level until correlation established and then switch over to heparin level monitoring --Daily CBC per protocol while on IV heparin  Sonya Sanchez D 05/04/2023 6:39 AM

## 2023-05-04 NOTE — Interval H&P Note (Signed)
History and Physical Interval Note:  05/04/2023 1:55 PM  Sonya Sanchez  has presented today for surgery, with the diagnosis of acute HFpEF and elevated troponin.  The various methods of treatment have been discussed with the patient and family. After consideration of risks, benefits and other options for treatment, the patient has consented to  Procedure(s): RIGHT/LEFT HEART CATH AND CORONARY ANGIOGRAPHY (N/A) as a surgical intervention.  The patient's history has been reviewed, patient examined, no change in status, stable for surgery.  I have reviewed the patient's chart and labs.  Questions were answered to the patient's satisfaction.    Cath Lab Visit (complete for each Cath Lab visit)  Clinical Evaluation Leading to the Procedure:   ACS: Yes.    Non-ACS:  N/A  [name]

## 2023-05-05 ENCOUNTER — Encounter: Payer: Self-pay | Admitting: Internal Medicine

## 2023-05-05 DIAGNOSIS — I25118 Atherosclerotic heart disease of native coronary artery with other forms of angina pectoris: Secondary | ICD-10-CM

## 2023-05-05 DIAGNOSIS — J9601 Acute respiratory failure with hypoxia: Secondary | ICD-10-CM

## 2023-05-05 DIAGNOSIS — G9341 Metabolic encephalopathy: Secondary | ICD-10-CM

## 2023-05-05 DIAGNOSIS — J9602 Acute respiratory failure with hypercapnia: Secondary | ICD-10-CM | POA: Diagnosis not present

## 2023-05-05 DIAGNOSIS — I2510 Atherosclerotic heart disease of native coronary artery without angina pectoris: Secondary | ICD-10-CM

## 2023-05-05 LAB — CBC
HCT: 35.2 % — ABNORMAL LOW (ref 36.0–46.0)
Hemoglobin: 11.1 g/dL — ABNORMAL LOW (ref 12.0–15.0)
MCH: 29 pg (ref 26.0–34.0)
MCHC: 31.5 g/dL (ref 30.0–36.0)
MCV: 91.9 fL (ref 80.0–100.0)
Platelets: 213 10*3/uL (ref 150–400)
RBC: 3.83 MIL/uL — ABNORMAL LOW (ref 3.87–5.11)
RDW: 19.5 % — ABNORMAL HIGH (ref 11.5–15.5)
WBC: 10.1 10*3/uL (ref 4.0–10.5)
nRBC: 0 % (ref 0.0–0.2)

## 2023-05-05 LAB — BASIC METABOLIC PANEL
Anion gap: 12 (ref 5–15)
BUN: 67 mg/dL — ABNORMAL HIGH (ref 8–23)
CO2: 30 mmol/L (ref 22–32)
Calcium: 9 mg/dL (ref 8.9–10.3)
Chloride: 97 mmol/L — ABNORMAL LOW (ref 98–111)
Creatinine, Ser: 1.22 mg/dL — ABNORMAL HIGH (ref 0.44–1.00)
GFR, Estimated: 48 mL/min — ABNORMAL LOW (ref >60)
Glucose, Bld: 175 mg/dL — ABNORMAL HIGH (ref 70–99)
Potassium: 3.8 mmol/L (ref 3.5–5.1)
Sodium: 139 mmol/L (ref 135–145)

## 2023-05-05 LAB — GLUCOSE, CAPILLARY
Glucose-Capillary: 176 mg/dL — ABNORMAL HIGH (ref 70–99)
Glucose-Capillary: 215 mg/dL — ABNORMAL HIGH (ref 70–99)

## 2023-05-05 LAB — APTT: aPTT: 24 seconds (ref 24–36)

## 2023-05-05 MED ORDER — OZEMPIC (2 MG/DOSE) 8 MG/3ML ~~LOC~~ SOPN: PEN_INJECTOR | SUBCUTANEOUS | 0 refills | Status: DC

## 2023-05-05 MED ORDER — METOPROLOL SUCCINATE ER 25 MG PO TB24: 25.0000 mg | ORAL_TABLET | Freq: Every day | ORAL | 0 refills | Status: DC

## 2023-05-05 MED ORDER — INSULIN ASPART 100 UNIT/ML IJ SOLN: 4.0000 [IU] | Freq: Three times a day (TID) | INTRAMUSCULAR | 11 refills | Status: DC

## 2023-05-05 MED ORDER — FUROSEMIDE 40 MG PO TABS
40.0000 mg | ORAL_TABLET | Freq: Every day | ORAL | Status: DC
  Administered 2023-05-05: 40 mg via ORAL
  Filled 2023-05-05: qty 1

## 2023-05-05 MED ORDER — IRBESARTAN 300 MG PO TABS: 300.0000 mg | ORAL_TABLET | Freq: Every day | ORAL | 0 refills | Status: DC

## 2023-05-05 MED ORDER — EZETIMIBE 10 MG PO TABS
10.0000 mg | ORAL_TABLET | Freq: Every day | ORAL | Status: DC
  Administered 2023-05-05: 10 mg via ORAL
  Filled 2023-05-05: qty 1

## 2023-05-05 NOTE — Care Management Important Message (Signed)
Important Message  Patient Details  Name: Sonya Sanchez MRN: [id number] Date of Birth: 1956-04-07   Medicare Important Message Given:  Yes     Johnell Comings 05/05/2023, 2:59 PM

## 2023-05-05 NOTE — Discharge Summary (Signed)
Physician Discharge Summary   Patient: Sonya Sanchez MRN: [id number] DOB: [birth year]  Admit date:     04/29/2023  Discharge date: 05/05/23  Discharge Physician: Jade Burright   PCP: Pcp, No   Recommendations at discharge:   Use CPAP at bedtime as well as oxygen supplementation at 3 L continuous  Discharge Diagnoses: Principal Problem:   Acute respiratory failure with hypoxia and hypercapnia (HCC) Active Problems:   Altered mental status   Acute heart failure with preserved ejection fraction (HCC)   Essential hypertension   Hyperlipidemia   Morbid obesity (HCC)   Pulmonary embolism (HCC)   Non-STEMI (non-ST elevated myocardial infarction) (HCC)   Poorly controlled type 2 diabetes mellitus with circulatory disorder (HCC)   Elevated troponin   OSA (obstructive sleep apnea)   Bedbound   Adult onset dermatomyositis (HCC)   Acute metabolic encephalopathy  Resolved Problems:   * No resolved hospital problems. *  Hospital Course: Ms.Sonya Sanchez is a 67 year old female with history of insulin-dependent diabetes mellitus, GERD, hypertension, hyperlipidemia, morbid obesity, OSA on CPAP, history of DVT, PE on anticoagulation, chronic respiratory failure on 2 L nasal cannula at baseline, dermatomyositis, baseline bedbound (has not walked for 3 years) prior smoking history, who presents emergency department for chief concerns of respiratory distress.  Patient was brought in from EMS via [facility].  Per report, patient SpO2 was 60% on 2 L nasal cannula.  Vitals in the ED showed temperature of 98.1, respiration rate of 14, heart rate 93, blood pressure 153/50, SpO2 of 94% on BiPAP.  Serum sodium is 143, potassium 4.5, 98, bicarb 28, BUN of 49, serum creatinine 1.04, EGFR 58, nonfasting blood glucose 338, WBC 11.4, hemoglobin 10.9, platelets of 200.  Procalcitonin was 0.18.  But BNP was 386.8.  High sensitive troponin is 40.  COVID PCR was negative.  ED treatment: Furosemide 60  mg IV one-time dose, insulin aspart 10 units, DuoNebs x 2 treatments.  Assessment and Plan: * Acute on chronic hypoxic and hypercapnic respiratory failure (HCC) Possibly multifactorial in the setting of CHF exacerbation, noncompliance with CPAP, sedatives use. Off Bipap, continue CPAP at night and oxygen supplementation during the day at 3 L      Acute on chronic diastolic dysfunction CHF 2D echocardiogram shows an LVEF of 60 to 65% with mild concentric LVH and grade 2 diastolic dysfunction.  Noted to have elevated left ventricular end-diastolic pressure. Continue Lasix, Avapro, spironolactone and metoprolol Maintain low-sodium diet     Lethargy/ metabolic encephalopathy  Resolved.  Patient back to baseline mental status Due to metabolic derangements including hypercarbia, hypoxia. Bipap weaned off. Continue supplemental oxygen. Mental status improved. Advised to avoid sedatives, opiates, benzos.    Elevated troponin Non-ST elevation MI Possibly demand ischemia due to acute on chronic hypercapnic, hypoxic respiratory failure. No chest pain.  Trop trended down. Status post left heart cath which showed severe single-vessel coronary artery disease with chronic total occlusion of distal RCA with PDA and PL branches filling via left-to-right collaterals.There is 70-80% stenosis of small-moderate first diagonal branch that is not well-suited to PCI.  Echo reviewed. Grade 2 diastolic dysfunction with moderate aortic stenosis Continue medical management, beta-blockers, atorvastatin and aspirin     Poorly controlled type 2 diabetes mellitus with circulatory disorder (HCC) Insulin-dependent diabetes mellitus Insulin sliding scale per protocol. Improved glycemic control Continue Lantus increased to 42, mealtime 4 units aspart started.     H/o DVT - Continue Eliquis    Adult onset dermatomyositis (HCC) Continue Azathioprine.  Bedbound Poor functional status- Discharge to skilled  nursing facility   OSA (obstructive sleep apnea) Morbid obesity CPAP at night   Morbid obesity (HCC) BMI 53.63 kg/m2 This complicates overall care and prognosis.  Diet, exercise and weight reduction advised.         Consultants: Cardiology Procedures performed: Left and right heart cath Disposition: Skilled nursing facility Diet recommendation:  Discharge Diet Orders (From admission, onward)     Start     Ordered   05/05/23 0000  Diet - low sodium heart healthy        05/05/23 1330   05/05/23 0000  Diet Carb Modified        05/05/23 1330           Carb modified diet DISCHARGE MEDICATION: Allergies as of 05/05/2023       Reactions   Sulfa Antibiotics Other (See Comments)   Reaction: isn't certain, thinks she ran a fever, or had a rash, maybe both.        Medication List     STOP taking these medications    albuterol 108 (90 Base) MCG/ACT inhaler Commonly known as: VENTOLIN HFA   amLODipine 5 MG tablet Commonly known as: NORVASC   diclofenac sodium 1 % Gel Commonly known as: VOLTAREN   fenofibrate 54 MG tablet   ipratropium 0.06 % nasal spray Commonly known as: Atrovent   linaclotide 72 MCG capsule Commonly known as: Linzess   LORazepam 0.5 MG tablet Commonly known as: ATIVAN   methocarbamol 750 MG tablet Commonly known as: ROBAXIN   mometasone 0.1 % lotion Commonly known as: ELOCON   NOVOLOG FLEXPEN [place] Replaced by: insulin aspart 100 UNIT/ML injection   Ozempic (1 MG/DOSE) 2 MG/1.5ML Sopn Generic drug: Semaglutide (1 MG/DOSE) Replaced by: Ozempic (2 MG/DOSE) 8 MG/3ML Sopn   potassium chloride 10 MEQ tablet Commonly known as: KLOR-CON   rosuvastatin 20 MG tablet Commonly known as: CRESTOR   sodium chloride 0.65 % Soln nasal spray Commonly known as: OCEAN   telmisartan-hydrochlorothiazide 80-12.5 MG tablet Commonly known as: MICARDIS HCT   TRESIBA FLEXTOUCH [place]       TAKE these medications    atorvastatin 40 MG  tablet Commonly known as: LIPITOR Take by mouth.   azaTHIOprine 50 MG tablet Commonly known as: IMURAN Take 100 mg by mouth 2 (two) times daily.   Basaglar KwikPen 100 UNIT/ML Inject 40 Units into the skin at bedtime.   Eliquis 5 MG Tabs tablet Generic drug: apixaban TAKE 1 TABLET(5 MG) BY MOUTH TWICE DAILY   esomeprazole 40 MG capsule Commonly known as: NEXIUM Take 40 mg by mouth daily before breakfast.   famotidine 20 MG tablet Commonly known as: PEPCID Take by mouth.   Fiasp 100 UNIT/ML Soln Generic drug: Insulin Aspart (w/Niacinamide) Inject into the skin.   fluticasone 50 MCG/ACT nasal spray Commonly known as: FLONASE Place 2 sprays into both nostrils daily as needed for allergies or rhinitis.   furosemide 40 MG tablet Commonly known as: LASIX Take 1 tablet (40 mg total) by mouth daily.   insulin aspart 100 UNIT/ML injection Commonly known as: novoLOG Inject 4 Units into the skin 3 (three) times daily with meals. Replaces: NOVOLOG FLEXPEN [place]   irbesartan 300 MG tablet Commonly known as: AVAPRO Take 1 tablet (300 mg total) by mouth daily. Start taking on: May 06, 2023   Janumet 50-500 MG tablet Generic drug: sitaGLIPtin-metformin TK 1 T PO BID   Jardiance 10 MG Tabs tablet Generic drug: empagliflozin  Take 10 mg by mouth daily.   loratadine 10 MG tablet Commonly known as: CLARITIN Take 1 tablet (10 mg total) by mouth daily as needed for allergies.   metoprolol succinate 25 MG 24 hr tablet Commonly known as: TOPROL-XL Take 1 tablet (25 mg total) by mouth daily. Start taking on: May 06, 2023 What changed:  medication strength how much to take additional instructions   multivitamin tablet Take 1 tablet by mouth daily.   Ozempic (2 MG/DOSE) 8 MG/3ML Sopn Generic drug: Semaglutide (2 MG/DOSE) Inject 0.25 mg once weekly for 4 week and increase to 0.5 mg weekly and follow up with PCP. Replaces: Ozempic (1 MG/DOSE) 2 MG/1.5ML Sopn   pregabalin 150  MG capsule Commonly known as: LYRICA Take 150 mg by mouth in the morning, at noon, and at bedtime.   spironolactone 25 MG tablet Commonly known as: ALDACTONE Take 25 mg by mouth daily.   Trelegy Ellipta 100-62.5-25 MCG/ACT Aepb Generic drug: Fluticasone-Umeclidin-Vilant Inhale into the lungs.   Vitamin D 1000 units capsule Take 2,000 Units by mouth daily.        Contact information for after-discharge care     Destination     [facility] Preferred SNF .   Service: Skilled Nursing Contact information: [address] [place] [address] [phone]                    Discharge Exam: Ceasar Mons Weights   04/29/23 1503  Weight: 133 kg   Physical Exam Vitals and nursing note reviewed.  Constitutional:      Appearance: She is obese.  HENT:     Head: Normocephalic and atraumatic.     Nose: Nose normal.     Mouth/Throat:     Mouth: Mucous membranes are moist.  Eyes:     Conjunctiva/sclera: Conjunctivae normal.  Cardiovascular:     Rate and Rhythm: Normal rate and regular rhythm.  Pulmonary:     Effort: Pulmonary effort is normal.     Breath sounds: Rales present.  Abdominal:     General: Bowel sounds are normal.     Palpations: Abdomen is soft.     Comments: Central adiposity  Musculoskeletal:        General: Normal range of motion.     Cervical back: Normal range of motion.     Right lower leg: Edema present.     Left lower leg: Edema present.     Comments: Trace lower extremity edema  Skin:    General: Skin is warm and dry.  Neurological:     Motor: Weakness present.  Psychiatric:        Mood and Affect: Mood normal.        Behavior: Behavior normal.      Condition at discharge: stable  The results of significant diagnostics from this hospitalization (including imaging, microbiology, ancillary and laboratory) are listed below for reference.   Imaging Studies: CARDIAC CATHETERIZATION  Result Date:  05/04/2023 Conclusions: Severe single-vessel coronary artery disease with chronic total occlusion of distal RCA with PDA and PL branches filling via left-to-right collaterals.  There is 70-80% stenosis of small-moderate first diagonal branch that is not well-suited to PCI. Mildly elevated left ventricular filling pressure (LVEDP 20 mmHg). Moderate aortic valve stenosis (mean gradient 24 mmHg). Small/stenotic right brachial veins and absent cephalic/basilic veins, not suitable for right heart catheterization. Recommendations: Restart gentle diuresis. Aggressive secondary prevention of coronary artery disease. Restart apixaban 5 mg twice daily tomorrow morning if  no evidence of bleeding/vascular injury. Yvonne Kendall, MD [facility]   ECHOCARDIOGRAM COMPLETE  Result Date: 05/01/2023    ECHOCARDIOGRAM REPORT   Patient Name:   Sonya Sanchez Date of Exam: 05/01/2023 Medical Rec #:  [id number]   Height:       62.0 in Accession #:    [id number]  Weight:       293.2 lb Date of Birth:  July 27, 1956   BSA:          2.249 m Patient Age:    67 years    BP:           154/62 mmHg Patient Gender: F           HR:           71 bpm. Exam Location:  [facility] Procedure: 2D Echo, Cardiac Doppler and Color Doppler Indications:    Dyspnea R06.00  History:        Patient has prior history of Echocardiogram examinations, most                 recent 08/10/2015. Risk Factors:Sleep Apnea, Hypertension,                 Dyslipidemia and Former Smoker. H/O DVT PE.  Sonographer:    Dondra Prader RVT RCS Referring Phys: [id number] AMY N COX  Sonographer Comments: Technically challenging study due to limited acoustic windows, Technically difficult study due to poor echo windows, suboptimal parasternal window, suboptimal apical window, suboptimal subcostal window and patient is obese. Image acquisition challenging due to patient body habitus and Image acquisition challenging due to respiratory motion. Definity deemed inappropriate due to limited to no windows.  Patient was supine and was in extreme discomfort. IMPRESSIONS  1. Left ventricular ejection fraction, by estimation, is 60 to 65%. The left ventricle has normal function. The left ventricle has no regional wall motion abnormalities. There is mild concentric left ventricular hypertrophy. Left ventricular diastolic parameters are consistent with Grade II diastolic dysfunction (pseudonormalization). Elevated left ventricular end-diastolic pressure.  2. Right ventricular systolic function is normal. The right ventricular size is normal. There is mildly elevated pulmonary artery systolic pressure.  3. Left atrial size was moderately dilated.  4. The mitral valve is degenerative. No evidence of mitral valve regurgitation. Mild mitral stenosis. The mean mitral valve gradient is 7.0 mmHg with average heart rate of 73 bpm. Moderate mitral annular calcification.  5. The aortic valve is tricuspid. There is mild calcification of the aortic valve. There is mild thickening of the aortic valve. Aortic valve regurgitation is not visualized. Mild aortic valve stenosis. Aortic valve area, by VTI measures 1.08 cm. Aortic valve mean gradient measures 10.0 mmHg. Aortic valve Vmax measures 2.13 m/s.  6. The inferior vena cava is dilated in size with <50% respiratory variability, suggesting right atrial pressure of 15 mmHg. Comparison(s): EF 60-65. FINDINGS  Left Ventricle: Left ventricular ejection fraction, by estimation, is 60 to 65%. The left ventricle has normal function. The left ventricle has no regional wall motion abnormalities. The left ventricular internal cavity size was normal in size. There is  mild concentric left ventricular hypertrophy. Left ventricular diastolic parameters are consistent with Grade II diastolic dysfunction (pseudonormalization). Elevated left ventricular end-diastolic pressure. Right Ventricle: The right ventricular size is normal. No increase in right ventricular wall thickness. Right ventricular  systolic function is normal. There is mildly elevated pulmonary artery systolic pressure. The tricuspid regurgitant velocity is 2.68  m/s, and with an assumed right atrial pressure  of 15 mmHg, the estimated right ventricular systolic pressure is 43.7 mmHg. Left Atrium: Left atrial size was moderately dilated. Right Atrium: Right atrial size was normal in size. Pericardium: There is no evidence of pericardial effusion. Mitral Valve: The mitral valve is degenerative in appearance. Moderate mitral annular calcification. No evidence of mitral valve regurgitation. Mild mitral valve stenosis. MV peak gradient, 18.1 mmHg. The mean mitral valve gradient is 7.0 mmHg with average heart rate of 73 bpm. Tricuspid Valve: The tricuspid valve is normal in structure. Tricuspid valve regurgitation is trivial. No evidence of tricuspid stenosis. Aortic Valve: The aortic valve is tricuspid. There is mild calcification of the aortic valve. There is mild thickening of the aortic valve. Aortic valve regurgitation is not visualized. Mild aortic stenosis is present. Aortic valve mean gradient measures  10.0 mmHg. Aortic valve peak gradient measures 18.1 mmHg. Aortic valve area, by VTI measures 1.08 cm. Pulmonic Valve: The pulmonic valve was normal in structure. Pulmonic valve regurgitation is not visualized. No evidence of pulmonic stenosis. Aorta: The aortic root is normal in size and structure. Venous: The inferior vena cava is dilated in size with less than 50% respiratory variability, suggesting right atrial pressure of 15 mmHg. IAS/Shunts: No atrial level shunt detected by color flow Doppler.  LEFT VENTRICLE PLAX 2D LVIDd:         5.00 cm   Diastology LVIDs:         3.00 cm   LV e' medial:    4.70 cm/s LV PW:         1.20 cm   LV E/e' medial:  41.7 LV IVS:        1.20 cm   LV e' lateral:   5.35 cm/s LVOT diam:     1.60 cm   LV E/e' lateral: 36.6 LV SV:         47 LV SV Index:   21 LVOT Area:     2.01 cm  RIGHT VENTRICLE              IVC RV S prime:     12.10 cm/s  IVC diam: 2.10 cm TAPSE (M-mode): 2.5 cm LEFT ATRIUM           Index LA diam:      4.70 cm 2.09 cm/m LA Vol (A4C): 85.3 ml 37.92 ml/m  AORTIC VALVE                     PULMONIC VALVE AV Area (Vmax):    1.09 cm      PV Vmax:       1.38 m/s AV Area (Vmean):   1.07 cm      PV Peak grad:  7.6 mmHg AV Area (VTI):     1.08 cm AV Vmax:           213.00 cm/s AV Vmean:          141.000 cm/s AV VTI:            0.430 m AV Peak Grad:      18.1 mmHg AV Mean Grad:      10.0 mmHg LVOT Vmax:         115.00 cm/s LVOT Vmean:        75.200 cm/s LVOT VTI:          0.232 m LVOT/AV VTI ratio: 0.54  AORTA Ao Root diam: 2.50 cm Ao Asc diam:  3.00 cm MITRAL VALVE  TRICUSPID VALVE MV Area (PHT): 2.12 cm     TR Peak grad:   28.7 mmHg MV Area VTI:   0.86 cm     TR Vmax:        268.00 cm/s MV Peak grad:  18.1 mmHg MV Mean grad:  7.0 mmHg     SHUNTS MV Vmax:       2.13 m/s     Systemic VTI:  0.23 m MV Vmean:      119.0 cm/s   Systemic Diam: 1.60 cm MV Decel Time: 357 msec MV E velocity: 196.00 cm/s MV A velocity: 117.00 cm/s MV E/A ratio:  1.68 Chilton Si MD Electronically signed by Chilton Si MD Signature Date/Time: 05/01/2023/12:58:24 PM    Final    CT HEAD WO CONTRAST ( )  Result Date: 04/29/2023 CLINICAL DATA:  Mental status change, unknown cause EXAM: CT HEAD WITHOUT CONTRAST TECHNIQUE: Contiguous axial images were obtained from the base of the skull through the vertex without intravenous contrast. RADIATION DOSE REDUCTION: This exam was performed according to the departmental dose-optimization program which includes automated exposure control, adjustment of the mA and/or kV according to patient size and/or use of iterative reconstruction technique. COMPARISON:  None Available. FINDINGS: Brain: No intracranial hemorrhage, mass effect, or midline shift. No hydrocephalus. The basilar cisterns are patent. No evidence of territorial infarct or acute ischemia. No extra-axial or  intracranial fluid collection. Vascular: Atherosclerosis of skullbase vasculature without hyperdense vessel or abnormal calcification. Skull: No fracture or focal lesion. Sinuses/Orbits: Small fluid level in right side of sphenoid sinus. Bilateral cataract resection. No mastoid effusion. Other: None. IMPRESSION: 1. No acute intracranial abnormality. 2. Small fluid level in the right side of sphenoid sinus, may be due to acute sinusitis. Electronically Signed   By: Narda Rutherford M.D.   On: 04/29/2023 19:15   DG Chest Portable 1 View  Result Date: 04/29/2023 CLINICAL DATA:  Shortness of breath EXAM: PORTABLE CHEST 1 VIEW COMPARISON:  X-ray 12/08/2016 FINDINGS: Underinflation. Mildly enlarged cardiopericardial silhouette with calcified aorta and evidence of vascular congestion with trace edema. Small left effusion as well. Thickening along the minor fissure. No pneumothorax. Overlapping cardiac leads. Calcified aorta. IMPRESSION: Enlarged heart with vascular congestion and trace edema. Small left effusion. Electronically Signed   By: Karen Kays M.D.   On: 04/29/2023 15:41    Microbiology: Results for orders placed or performed during the hospital encounter of 04/29/23  Blood culture (routine x 2)     Status: None   Collection Time: 04/29/23  3:07 PM   Specimen: BLOOD  Result Value Ref Range Status   Specimen Description BLOOD LEFT ANTECUBITAL  Final   Special Requests   Final    BOTTLES DRAWN AEROBIC AND ANAEROBIC Blood Culture adequate volume   Culture   Final    NO GROWTH 5 DAYS Performed at [facility], [address], [place], [place] [address]    Report Status 05/04/2023 FINAL  Final  Blood culture (routine x 2)     Status: None   Collection Time: 04/29/23  3:28 PM   Specimen: BLOOD  Result Value Ref Range Status   Specimen Description BLOOD BLOOD LEFT HAND  Final   Special Requests   Final    BOTTLES DRAWN AEROBIC AND ANAEROBIC Blood Culture adequate volume   Culture    Final    NO GROWTH 5 DAYS Performed at [facility], [address] [place], [place] [address]    Report Status 05/04/2023 FINAL  Final  SARS Coronavirus 2 by RT PCR (hospital order, performed in [facility] hospital lab) *cepheid single result test* Anterior Nasal Swab     Status: None   Collection Time: 04/29/23  3:44 PM   Specimen: Anterior Nasal Swab  Result Value Ref Range Status   SARS Coronavirus 2 by RT PCR NEGATIVE NEGATIVE Final    Comment: (NOTE) SARS-CoV-2 target nucleic acids are NOT DETECTED.  The SARS-CoV-2 RNA is generally detectable in upper and lower respiratory specimens during the acute phase of infection. The lowest concentration of SARS-CoV-2 viral copies this assay can detect is 250 copies / mL. A negative result does not preclude SARS-CoV-2 infection and should not be used as the sole basis for treatment or other patient management decisions.  A negative result may occur with improper specimen collection / handling, submission of specimen other than nasopharyngeal swab, presence of viral mutation(s) within the areas targeted by this assay, and inadequate number of viral copies (<250 copies / mL). A negative result must be combined with clinical observations, patient history, and epidemiological information.  Fact Sheet for Patients:   RoadLapTop.co.za  Fact Sheet for Healthcare Providers: http://kim-miller.com/  This test is not yet approved or  cleared by the [place] FDA and has been authorized for detection and/or diagnosis of SARS-CoV-2 by FDA under an Emergency Use Authorization (EUA).  This EUA will remain in effect (meaning this test can be used) for the duration of the COVID-19 declaration under Section 564(b)(1) of the Act, 21 U.S.C. section 360bbb-3(b)(1), unless the authorization is terminated or revoked sooner.  Performed at [facility], [address]  [place], [place] [address]     Labs: CBC: Recent Labs  Lab 04/29/23 1507 04/30/23 0511 05/01/23 0401 05/02/23 0417 05/03/23 0421 05/04/23 0549 05/05/23 0730  WBC 11.4*   < > 7.9 7.8 7.7 9.5 10.1  NEUTROABS 10.3*  --   --   --   --   --   --   HGB 10.9*   < > 9.8* 10.8* 10.3* 10.3* 11.1*  HCT 37.2   < > 32.0* 34.0* 32.0* 32.5* 35.2*  MCV 98.9   < > 93.8 91.4 90.9 91.8 91.9  PLT 200   < > 180 207 200 188 213   < > = values in this interval not displayed.   Basic Metabolic Panel: Recent Labs  Lab 05/02/23 0417 05/03/23 0421 05/03/23 1417 05/04/23 0549 05/05/23 0729  NA 139 139 139 140 139  K 4.1 4.1 4.5 3.9 3.8  CL 95* 94* 96* 97* 97*  CO2 33* 32 32 32 30  GLUCOSE 261* 225* 249* 173* 175*  BUN 73* 71* 71* 64* 67*  CREATININE 1.12* 1.16* 1.08* 1.00 1.22*  CALCIUM 9.2 9.1 9.3 9.0 9.0   Liver Function Tests: Recent Labs  Lab 04/29/23 1507  AST 15  ALT 8  ALKPHOS 56  BILITOT 0.8  PROT 7.2  ALBUMIN 3.4*   CBG: Recent Labs  Lab 05/04/23 1156 05/04/23 1605 05/04/23 2332 05/05/23 0849 05/05/23 1222  GLUCAP 187* 214* 233* 176* 215*    Discharge time spent: greater than 30 minutes.  Signed: Lucile Shutters, MD [facility] 05/05/2023

## 2023-05-05 NOTE — Progress Notes (Signed)
Rounding Note    Patient Name: Sonya Sanchez Date of Encounter: 05/05/2023  [facility] Cardiologist: Julien Nordmann, MD   Subjective   Cardiac catheterization yesterday, occluded RCA with collaterals from left to right, no intervention needed, aggressive medical management recommended Received additional dose IV Lasix yesterday -She reports her breathing is much improved Trend up and creatinine 1.2 to BUN relatively stable 67 Reports lower extremity edema essentially resolved, abdomen soft  Inpatient Medications    Scheduled Meds:  apixaban  5 mg Oral BID   aspirin  81 mg Oral Daily   azaTHIOprine  100 mg Oral BID   ezetimibe  10 mg Oral Daily   fluticasone furoate-vilanterol  1 puff Inhalation Daily   And   umeclidinium bromide  1 puff Inhalation Daily   furosemide  40 mg Oral Daily   insulin aspart  0-20 Units Subcutaneous TID WC   insulin aspart  0-5 Units Subcutaneous QHS   insulin aspart  4 Units Subcutaneous TID WC   insulin glargine-yfgn  42 Units Subcutaneous QHS   irbesartan  300 mg Oral Daily   metoprolol succinate  25 mg Oral Daily   multivitamin with minerals  1 tablet Oral Daily   pantoprazole  40 mg Oral QAC breakfast   rosuvastatin  20 mg Oral Daily   sodium chloride flush  3 mL Intravenous Q12H   sodium chloride flush  3 mL Intravenous Q12H   spironolactone  25 mg Oral Daily   Continuous Infusions:  sodium chloride     PRN Meds: sodium chloride, acetaminophen, ipratropium-albuterol, ketorolac, senna-docusate, sodium chloride flush   Vital Signs    Vitals:   05/04/23 2334 05/05/23 0409 05/05/23 0847 05/05/23 1221  BP: (!) 117/49 (!) 161/58 (!) 147/53 (!) 107/47  Pulse: 66 62 63 64  Resp: 19 18 16 18   Temp: 98.2 F (36.8 C) 98.2 F (36.8 C) 99.1 F (37.3 C) 97.7 F (36.5 C)  TempSrc:      SpO2: 96% 99% 98% 96%  Weight:      Height:        Intake/Output Summary (Last 24 hours) at 05/05/2023 1424 Last data filed at 05/05/2023  1046 Gross per 24 hour  Intake 816.38 ml  Output 500 ml  Net 316.38 ml      04/29/2023    3:03 PM 01/27/2018   10:46 AM 01/19/2018    4:19 PM  Last 3 Weights  Weight (lbs) 293 lb 3.4 oz 300 lb 3.2 oz 302 lb 9.6 oz  Weight (kg) 133 kg 136.17 kg 137.258 kg      Telemetry    Normal sinus rhythm- Personally Reviewed  ECG     - Personally Reviewed  Physical Exam   GEN: No acute distress.   Neck: No JVD Cardiac: RRR, no murmurs, rubs, or gallops.  Respiratory: Clear to auscultation bilaterally. GI: Soft, nontender, non-distended  MS: No edema; No deformity. Neuro:  Nonfocal  Psych: Normal affect   Labs    High Sensitivity Troponin:   Recent Labs  Lab 04/30/23 1300 04/30/23 1437 05/01/23 0401 05/01/23 0908 05/01/23 1221  TROPONINIHS 1,678* 1,669* 958* 904* 859*     Chemistry Recent Labs  Lab 04/29/23 1507 04/30/23 1300 05/03/23 1417 05/04/23 0549 05/05/23 0729  NA 143   < > 139 140 139  K 4.5   < > 4.5 3.9 3.8  CL 98   < > 96* 97* 97*  CO2 28   < > 32 32 30  GLUCOSE 338*   < > 249* 173* 175*  BUN 49*   < > 71* 64* 67*  CREATININE 1.04*   < > 1.08* 1.00 1.22*  CALCIUM 9.1   < > 9.3 9.0 9.0  PROT 7.2  --   --   --   --   ALBUMIN 3.4*  --   --   --   --   AST 15  --   --   --   --   ALT 8  --   --   --   --   ALKPHOS 56  --   --   --   --   BILITOT 0.8  --   --   --   --   GFRNONAA 58*   < > 56* >60 48*  ANIONGAP 17*   < > 11 11 12    < > = values in this interval not displayed.    Lipids  Recent Labs  Lab 05/03/23 0421  CHOL 196  TRIG 206*  HDL 26*  LDLCALC 129*  CHOLHDL 7.5    Hematology Recent Labs  Lab 05/03/23 0421 05/04/23 0549 05/05/23 0730  WBC 7.7 9.5 10.1  RBC 3.52* 3.54* 3.83*  HGB 10.3* 10.3* 11.1*  HCT 32.0* 32.5* 35.2*  MCV 90.9 91.8 91.9  MCH 29.3 29.1 29.0  MCHC 32.2 31.7 31.5  RDW 18.6* 18.6* 19.5*  PLT 200 188 213   Thyroid No results for input(s): "TSH", "FREET4" in the last 168 hours.  BNP Recent Labs  Lab  04/29/23 1507  BNP 386.8*    DDimer No results for input(s): "DDIMER" in the last 168 hours.   Radiology    CARDIAC CATHETERIZATION  Result Date: 05/04/2023 Conclusions: Severe single-vessel coronary artery disease with chronic total occlusion of distal RCA with PDA and PL branches filling via left-to-right collaterals.  There is 70-80% stenosis of small-moderate first diagonal branch that is not well-suited to PCI. Mildly elevated left ventricular filling pressure (LVEDP 20 mmHg). Moderate aortic valve stenosis (mean gradient 24 mmHg). Small/stenotic right brachial veins and absent cephalic/basilic veins, not suitable for right heart catheterization. Recommendations: Restart gentle diuresis. Aggressive secondary prevention of coronary artery disease. Restart apixaban 5 mg twice daily tomorrow morning if no evidence of bleeding/vascular injury. Yvonne Kendall, MD [facility]    Cardiac Studies   2D Echocardiogram  6.2.2024   1. Left ventricular ejection fraction, by estimation, is 60 to 65%. The  left ventricle has normal function. The left ventricle has no regional  wall motion abnormalities. There is mild concentric left ventricular  hypertrophy. Left ventricular diastolic  parameters are consistent with Grade II diastolic dysfunction  (pseudonormalization). Elevated left ventricular end-diastolic pressure.   2. Right ventricular systolic function is normal. The right ventricular  size is normal. There is mildly elevated pulmonary artery systolic  pressure.   3. Left atrial size was moderately dilated.   4. The mitral valve is degenerative. No evidence of mitral valve  regurgitation. Mild mitral stenosis. The mean mitral valve gradient is 7.0  mmHg with average heart rate of 73 bpm. Moderate mitral annular  calcification.   5. The aortic valve is tricuspid. There is mild calcification of the  aortic valve. There is mild thickening of the aortic valve. Aortic valve  regurgitation  is not visualized. Mild aortic valve stenosis. Aortic valve  area, by VTI measures 1.08 cm.  Aortic valve mean gradient measures 10.0 mmHg. Aortic valve Vmax measures  2.13 m/s.  6. The inferior vena cava is dilated in size with <50% respiratory  variability, suggesting right atrial pressure of 15 mmHg.     Patient Profile     67 y.o. 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   Assessment & Plan    Non-STEMI Peak troponin 1600 In the setting of respiratory failure, obesity hypoventilation syndrome Diastolic heart failure Left heart catheterization yesterday with occluded mid RCA, collaterals left to right -On aspirin, Eliquis, Crestor Zetia metoprolol   Acute on chronic hypoxic respiratory failure/sleep apnea/obesity hypoventilation syndrome Echo with normal EF, grade 2 diastolic dysfunction Will transition to Lasix 40 daily p.o. off IV Spironolactone 25, irbesartan 300 daily, metoprolol succinate 25 daily   Essential hypertension On irbesartan 300, metoprolol succinate 25 daily, spironolactone 25 daily Blood pressure low limiting exchange to Entresto  Morbid obesity/obesity hypoventilation syndrome On CPAP at night, oxygen supplementation in the day  Debility Poor functional status, leg weakness, unable to ambulate Exacerbated by morbid obesity   Total encounter time more than 50 minutes  Greater than 50% was spent in counseling and coordination of care with the patient    For questions or updates, please contact [facility] Please consult [email or web address] for contact info under        Signed, Julien Nordmann, MD  05/05/2023, 2:24 PM

## 2023-05-05 NOTE — TOC Transition Note (Signed)
Transition of Care [facility]) - CM/SW Discharge Note   Patient Details  Name: Sonya Sanchez MRN: [id number] Date of Birth: 04/03/1956  [facility] CM/SW Contact:  Truddie Hidden, RN Phone Number: 05/05/2023, 3:00 PM   Clinical Narrative:    Sherron Monday with [name]  in admissions at [facility]  Per facility patient admission confirmed for today. Patient assigned room #  15-B Nurse will call report to [phone] Nurse, and patient notified. Face sheet and medical necessity forms printed to the floor to be added to the EMS pack EMS arranged  Discharge summary and SNF tranfer report sent in HUB.  TOC signing off.         Patient Goals and CMS Choice      Discharge Placement                         Discharge Plan and Services Additional resources added to the After Visit Summary for                                       Social Determinants of Health (SDOH) Interventions SDOH Screenings   Food Insecurity: No Food Insecurity (04/30/2023)  Housing: Low Risk  (04/30/2023)  Transportation Needs: No Transportation Needs (04/30/2023)  Utilities: Not At Risk (04/30/2023)  Tobacco Use: Medium Risk (05/05/2023)     Readmission Risk Interventions     No data to display

## 2023-05-05 NOTE — Plan of Care (Signed)

## 2023-05-05 NOTE — Plan of Care (Signed)
Problem: Education: Goal: Ability to describe self-care measures that may prevent or decrease complications (Diabetes Survival Skills Education) will improve 05/05/2023 1613 by Genia Hotter, RN Outcome: Adequate for Discharge 05/05/2023 [phone] by Genia Hotter, RN Outcome: Progressing Goal: Individualized Educational Video(s) 05/05/2023 1613 by Genia Hotter, RN Outcome: Adequate for Discharge 05/05/2023 [phone] by Genia Hotter, RN Outcome: Progressing   Problem: Coping: Goal: Ability to adjust to condition or change in health will improve 05/05/2023 1613 by Genia Hotter, RN Outcome: Adequate for Discharge 05/05/2023 0837 by Genia Hotter, RN Outcome: Progressing   Problem: Fluid Volume: Goal: Ability to maintain a balanced intake and output will improve 05/05/2023 1613 by Genia Hotter, RN Outcome: Adequate for Discharge 05/05/2023 0837 by Genia Hotter, RN Outcome: Progressing   Problem: Health Behavior/Discharge Planning: Goal: Ability to identify and utilize available resources and services will improve 05/05/2023 1613 by Genia Hotter, RN Outcome: Adequate for Discharge 05/05/2023 [phone] by Genia Hotter, RN Outcome: Progressing Goal: Ability to manage health-related needs will improve 05/05/2023 1613 by Genia Hotter, RN Outcome: Adequate for Discharge 05/05/2023 [phone] by Genia Hotter, RN Outcome: Progressing   Problem: Metabolic: Goal: Ability to maintain appropriate glucose levels will improve 05/05/2023 1613 by Genia Hotter, RN Outcome: Adequate for Discharge 05/05/2023 0837 by Genia Hotter, RN Outcome: Progressing   Problem: Nutritional: Goal: Maintenance of adequate nutrition will improve 05/05/2023 1613 by Genia Hotter, RN Outcome: Adequate for Discharge 05/05/2023 [phone] by Genia Hotter, RN Outcome: Progressing Goal: Progress toward achieving an optimal weight will improve 05/05/2023 1613 by Genia Hotter, RN Outcome: Adequate for Discharge 05/05/2023 [phone] by Genia Hotter,  RN Outcome: Progressing   Problem: Skin Integrity: Goal: Risk for impaired skin integrity will decrease 05/05/2023 1613 by Genia Hotter, RN Outcome: Adequate for Discharge 05/05/2023 [phone] by Genia Hotter, RN Outcome: Progressing   Problem: Tissue Perfusion: Goal: Adequacy of tissue perfusion will improve 05/05/2023 1613 by Genia Hotter, RN Outcome: Adequate for Discharge 05/05/2023 0837 by Genia Hotter, RN Outcome: Progressing   Problem: Education: Goal: Knowledge of General Education information will improve Description: Including pain rating scale, medication(s)/side effects and non-pharmacologic comfort measures 05/05/2023 1613 by Genia Hotter, RN Outcome: Adequate for Discharge 05/05/2023 0837 by Genia Hotter, RN Outcome: Progressing   Problem: Health Behavior/Discharge Planning: Goal: Ability to manage health-related needs will improve 05/05/2023 1613 by Genia Hotter, RN Outcome: Adequate for Discharge 05/05/2023 [phone] by Genia Hotter, RN Outcome: Progressing   Problem: Clinical Measurements: Goal: Ability to maintain clinical measurements within normal limits will improve 05/05/2023 1613 by Genia Hotter, RN Outcome: Adequate for Discharge 05/05/2023 0837 by Genia Hotter, RN Outcome: Progressing Goal: Will remain free from infection 05/05/2023 1613 by Genia Hotter, RN Outcome: Adequate for Discharge 05/05/2023 [phone] by Genia Hotter, RN Outcome: Progressing Goal: Diagnostic test results will improve 05/05/2023 1613 by Genia Hotter, RN Outcome: Adequate for Discharge 05/05/2023 [phone] by Genia Hotter, RN Outcome: Progressing Goal: Respiratory complications will improve 05/05/2023 1613 by Genia Hotter, RN Outcome: Adequate for Discharge 05/05/2023 [phone] by Genia Hotter, RN Outcome: Progressing Goal: Cardiovascular complication will be avoided 05/05/2023 1613 by Genia Hotter, RN Outcome: Adequate for Discharge 05/05/2023 0837 by Genia Hotter, RN Outcome: Progressing   Problem:  Activity: Goal: Risk for activity intolerance will decrease 05/05/2023 1613 by Genia Hotter, RN Outcome: Adequate for Discharge 05/05/2023 [phone] by Genia Hotter, RN Outcome: Progressing   Problem: Nutrition: Goal: Adequate nutrition will be maintained 05/05/2023 1613 by Genia Hotter, RN Outcome: Adequate for Discharge 05/05/2023 0837 by Genia Hotter, RN Outcome: Progressing  Problem: Coping: Goal: Level of anxiety will decrease 05/05/2023 1613 by Genia Hotter, RN Outcome: Adequate for Discharge 05/05/2023 [phone] by Genia Hotter, RN Outcome: Progressing   Problem: Elimination: Goal: Will not experience complications related to bowel motility 05/05/2023 1613 by Genia Hotter, RN Outcome: Adequate for Discharge 05/05/2023 0837 by Genia Hotter, RN Outcome: Progressing Goal: Will not experience complications related to urinary retention 05/05/2023 1613 by Genia Hotter, RN Outcome: Adequate for Discharge 05/05/2023 0837 by Genia Hotter, RN Outcome: Progressing   Problem: Pain Managment: Goal: General experience of comfort will improve 05/05/2023 1613 by Genia Hotter, RN Outcome: Adequate for Discharge 05/05/2023 0837 by Genia Hotter, RN Outcome: Progressing   Problem: Safety: Goal: Ability to remain free from injury will improve 05/05/2023 1613 by Genia Hotter, RN Outcome: Adequate for Discharge 05/05/2023 0837 by Genia Hotter, RN Outcome: Progressing   Problem: Skin Integrity: Goal: Risk for impaired skin integrity will decrease 05/05/2023 1613 by Genia Hotter, RN Outcome: Adequate for Discharge 05/05/2023 0837 by Genia Hotter, RN Outcome: Progressing   Problem: Education: Goal: Understanding of CV disease, CV risk reduction, and recovery process will improve 05/05/2023 1613 by Genia Hotter, RN Outcome: Adequate for Discharge 05/05/2023 0837 by Genia Hotter, RN Outcome: Progressing Goal: Individualized Educational Video(s) 05/05/2023 1613 by Genia Hotter, RN Outcome: Adequate for  Discharge 05/05/2023 0837 by Genia Hotter, RN Outcome: Progressing   Problem: Activity: Goal: Ability to return to baseline activity level will improve 05/05/2023 1613 by Genia Hotter, RN Outcome: Adequate for Discharge 05/05/2023 [phone] by Genia Hotter, RN Outcome: Progressing   Problem: Cardiovascular: Goal: Ability to achieve and maintain adequate cardiovascular perfusion will improve 05/05/2023 1613 by Genia Hotter, RN Outcome: Adequate for Discharge 05/05/2023 0837 by Genia Hotter, RN Outcome: Progressing Goal: Vascular access site(s) Level 0-1 will be maintained 05/05/2023 1613 by Genia Hotter, RN Outcome: Adequate for Discharge 05/05/2023 0837 by Genia Hotter, RN Outcome: Progressing   Problem: Health Behavior/Discharge Planning: Goal: Ability to safely manage health-related needs after discharge will improve 05/05/2023 1613 by Genia Hotter, RN Outcome: Adequate for Discharge 05/05/2023 0837 by Genia Hotter, RN Outcome: Progressing

## 2023-05-06 LAB — LIPOPROTEIN A (LPA): Lipoprotein (a): 33.6 nmol/L — ABNORMAL HIGH (ref <75.0)

## 2023-05-13 ENCOUNTER — Inpatient Hospital Stay
Admission: EM | Admit: 2023-05-13 | Discharge: 2023-05-27 | DRG: 682 | Disposition: A | Discharge status: Skilled Nursing Facility | Payer: Medicare Other | Source: Skilled Nursing Facility | Attending: Osteopathic Medicine | Admitting: Osteopathic Medicine

## 2023-05-13 ENCOUNTER — Other Ambulatory Visit: Payer: Self-pay

## 2023-05-13 DIAGNOSIS — N170 Acute kidney failure with tubular necrosis: Principal | ICD-10-CM | POA: Diagnosis present

## 2023-05-13 DIAGNOSIS — J42 Unspecified chronic bronchitis: Secondary | ICD-10-CM | POA: Diagnosis present

## 2023-05-13 DIAGNOSIS — Z8672 Personal history of thrombophlebitis: Secondary | ICD-10-CM

## 2023-05-13 DIAGNOSIS — M109 Gout, unspecified: Secondary | ICD-10-CM | POA: Diagnosis present

## 2023-05-13 DIAGNOSIS — Z86718 Personal history of other venous thrombosis and embolism: Secondary | ICD-10-CM

## 2023-05-13 DIAGNOSIS — G9349 Other encephalopathy: Secondary | ICD-10-CM | POA: Diagnosis present

## 2023-05-13 DIAGNOSIS — B9620 Unspecified Escherichia coli [E. coli] as the cause of diseases classified elsewhere: Secondary | ICD-10-CM | POA: Diagnosis present

## 2023-05-13 DIAGNOSIS — D509 Iron deficiency anemia, unspecified: Secondary | ICD-10-CM | POA: Diagnosis present

## 2023-05-13 DIAGNOSIS — R6521 Severe sepsis with septic shock: Secondary | ICD-10-CM | POA: Diagnosis not present

## 2023-05-13 DIAGNOSIS — Z8261 Family history of arthritis: Secondary | ICD-10-CM

## 2023-05-13 DIAGNOSIS — Z8249 Family history of ischemic heart disease and other diseases of the circulatory system: Secondary | ICD-10-CM

## 2023-05-13 DIAGNOSIS — I2782 Chronic pulmonary embolism: Secondary | ICD-10-CM | POA: Diagnosis present

## 2023-05-13 DIAGNOSIS — I2699 Other pulmonary embolism without acute cor pulmonale: Secondary | ICD-10-CM | POA: Diagnosis present

## 2023-05-13 DIAGNOSIS — I252 Old myocardial infarction: Secondary | ICD-10-CM

## 2023-05-13 DIAGNOSIS — I214 Non-ST elevation (NSTEMI) myocardial infarction: Secondary | ICD-10-CM | POA: Diagnosis present

## 2023-05-13 DIAGNOSIS — Z515 Encounter for palliative care: Secondary | ICD-10-CM

## 2023-05-13 DIAGNOSIS — I5033 Acute on chronic diastolic (congestive) heart failure: Secondary | ICD-10-CM | POA: Diagnosis present

## 2023-05-13 DIAGNOSIS — Z87442 Personal history of urinary calculi: Secondary | ICD-10-CM

## 2023-05-13 DIAGNOSIS — T508X5A Adverse effect of diagnostic agents, initial encounter: Secondary | ICD-10-CM | POA: Diagnosis present

## 2023-05-13 DIAGNOSIS — Z7985 Long-term (current) use of injectable non-insulin antidiabetic drugs: Secondary | ICD-10-CM

## 2023-05-13 DIAGNOSIS — Z833 Family history of diabetes mellitus: Secondary | ICD-10-CM

## 2023-05-13 DIAGNOSIS — I2582 Chronic total occlusion of coronary artery: Secondary | ICD-10-CM | POA: Diagnosis present

## 2023-05-13 DIAGNOSIS — I050 Rheumatic mitral stenosis: Secondary | ICD-10-CM

## 2023-05-13 DIAGNOSIS — D649 Anemia, unspecified: Secondary | ICD-10-CM

## 2023-05-13 DIAGNOSIS — Z90710 Acquired absence of both cervix and uterus: Secondary | ICD-10-CM

## 2023-05-13 DIAGNOSIS — N390 Urinary tract infection, site not specified: Secondary | ICD-10-CM | POA: Diagnosis present

## 2023-05-13 DIAGNOSIS — Z79899 Other long term (current) drug therapy: Secondary | ICD-10-CM

## 2023-05-13 DIAGNOSIS — J9622 Acute and chronic respiratory failure with hypercapnia: Secondary | ICD-10-CM | POA: Diagnosis present

## 2023-05-13 DIAGNOSIS — A419 Sepsis, unspecified organism: Secondary | ICD-10-CM | POA: Diagnosis not present

## 2023-05-13 DIAGNOSIS — Z794 Long term (current) use of insulin: Secondary | ICD-10-CM

## 2023-05-13 DIAGNOSIS — Z7401 Bed confinement status: Secondary | ICD-10-CM

## 2023-05-13 DIAGNOSIS — E861 Hypovolemia: Secondary | ICD-10-CM | POA: Diagnosis present

## 2023-05-13 DIAGNOSIS — Z9049 Acquired absence of other specified parts of digestive tract: Secondary | ICD-10-CM

## 2023-05-13 DIAGNOSIS — Z7984 Long term (current) use of oral hypoglycemic drugs: Secondary | ICD-10-CM

## 2023-05-13 DIAGNOSIS — E785 Hyperlipidemia, unspecified: Secondary | ICD-10-CM | POA: Diagnosis present

## 2023-05-13 DIAGNOSIS — Y95 Nosocomial condition: Secondary | ICD-10-CM | POA: Diagnosis not present

## 2023-05-13 DIAGNOSIS — E662 Morbid (severe) obesity with alveolar hypoventilation: Secondary | ICD-10-CM | POA: Diagnosis present

## 2023-05-13 DIAGNOSIS — Z993 Dependence on wheelchair: Secondary | ICD-10-CM

## 2023-05-13 DIAGNOSIS — I110 Hypertensive heart disease with heart failure: Secondary | ICD-10-CM | POA: Diagnosis present

## 2023-05-13 DIAGNOSIS — M3313 Other dermatomyositis without myopathy: Secondary | ICD-10-CM | POA: Diagnosis present

## 2023-05-13 DIAGNOSIS — Z6841 Body Mass Index (BMI) 40.0 and over, adult: Secondary | ICD-10-CM

## 2023-05-13 DIAGNOSIS — E8729 Other acidosis: Secondary | ICD-10-CM | POA: Diagnosis not present

## 2023-05-13 DIAGNOSIS — J189 Pneumonia, unspecified organism: Secondary | ICD-10-CM | POA: Diagnosis not present

## 2023-05-13 DIAGNOSIS — E1165 Type 2 diabetes mellitus with hyperglycemia: Secondary | ICD-10-CM | POA: Diagnosis present

## 2023-05-13 DIAGNOSIS — Z7951 Long term (current) use of inhaled steroids: Secondary | ICD-10-CM

## 2023-05-13 DIAGNOSIS — I4891 Unspecified atrial fibrillation: Secondary | ICD-10-CM

## 2023-05-13 DIAGNOSIS — G4733 Obstructive sleep apnea (adult) (pediatric): Secondary | ICD-10-CM | POA: Diagnosis present

## 2023-05-13 DIAGNOSIS — N179 Acute kidney failure, unspecified: Secondary | ICD-10-CM | POA: Insufficient documentation

## 2023-05-13 DIAGNOSIS — Z1619 Resistance to other specified beta lactam antibiotics: Secondary | ICD-10-CM | POA: Diagnosis not present

## 2023-05-13 DIAGNOSIS — E871 Hypo-osmolality and hyponatremia: Secondary | ICD-10-CM | POA: Diagnosis present

## 2023-05-13 DIAGNOSIS — Z7901 Long term (current) use of anticoagulants: Secondary | ICD-10-CM

## 2023-05-13 DIAGNOSIS — E875 Hyperkalemia: Secondary | ICD-10-CM | POA: Diagnosis present

## 2023-05-13 DIAGNOSIS — I080 Rheumatic disorders of both mitral and aortic valves: Secondary | ICD-10-CM | POA: Diagnosis present

## 2023-05-13 DIAGNOSIS — I2510 Atherosclerotic heart disease of native coronary artery without angina pectoris: Secondary | ICD-10-CM | POA: Diagnosis present

## 2023-05-13 DIAGNOSIS — Z87891 Personal history of nicotine dependence: Secondary | ICD-10-CM

## 2023-05-13 DIAGNOSIS — I480 Paroxysmal atrial fibrillation: Secondary | ICD-10-CM | POA: Diagnosis not present

## 2023-05-13 DIAGNOSIS — I10 Essential (primary) hypertension: Secondary | ICD-10-CM | POA: Diagnosis present

## 2023-05-13 DIAGNOSIS — Z882 Allergy status to sulfonamides status: Secondary | ICD-10-CM

## 2023-05-13 DIAGNOSIS — R4182 Altered mental status, unspecified: Secondary | ICD-10-CM | POA: Diagnosis present

## 2023-05-13 DIAGNOSIS — J9621 Acute and chronic respiratory failure with hypoxia: Secondary | ICD-10-CM | POA: Diagnosis present

## 2023-05-13 DIAGNOSIS — I2583 Coronary atherosclerosis due to lipid rich plaque: Secondary | ICD-10-CM | POA: Diagnosis present

## 2023-05-13 DIAGNOSIS — I5032 Chronic diastolic (congestive) heart failure: Secondary | ICD-10-CM

## 2023-05-13 LAB — BASIC METABOLIC PANEL
Anion gap: 15 (ref 5–15)
BUN: 127 mg/dL — ABNORMAL HIGH (ref 8–23)
CO2: 21 mmol/L — ABNORMAL LOW (ref 22–32)
Calcium: 7 mg/dL — ABNORMAL LOW (ref 8.9–10.3)
Chloride: 92 mmol/L — ABNORMAL LOW (ref 98–111)
Creatinine, Ser: 3.64 mg/dL — ABNORMAL HIGH (ref 0.44–1.00)
GFR, Estimated: 13 mL/min — ABNORMAL LOW (ref >60)
Glucose, Bld: 136 mg/dL — ABNORMAL HIGH (ref 70–99)
Potassium: 6.2 mmol/L — ABNORMAL HIGH (ref 3.5–5.1)
Sodium: 128 mmol/L — ABNORMAL LOW (ref 135–145)

## 2023-05-13 MED ORDER — SODIUM ZIRCONIUM CYCLOSILICATE 10 G PO PACK
10.0000 g | PACK | Freq: Once | ORAL | Status: AC
  Administered 2023-05-14: 10 g via ORAL
  Filled 2023-05-13: qty 1

## 2023-05-13 MED ORDER — CALCIUM GLUCONATE-NACL 1-0.675 GM/50ML-% IV SOLN
1.0000 g | Freq: Once | INTRAVENOUS | Status: AC
  Administered 2023-05-14: 1000 mg via INTRAVENOUS
  Filled 2023-05-13: qty 50

## 2023-05-13 MED ORDER — INSULIN ASPART 100 UNIT/ML IJ SOLN
10.0000 [IU] | Freq: Once | INTRAMUSCULAR | Status: AC
  Administered 2023-05-14: 10 [IU] via INTRAVENOUS
  Filled 2023-05-13: qty 1

## 2023-05-13 MED ORDER — SODIUM CHLORIDE 0.9 % IV BOLUS
1000.0000 mL | Freq: Once | INTRAVENOUS | Status: AC
  Administered 2023-05-14: 1000 mL via INTRAVENOUS

## 2023-05-13 MED ORDER — DEXTROSE 50 % IV SOLN
1.0000 | Freq: Once | INTRAVENOUS | Status: AC
  Administered 2023-05-14: 50 mL via INTRAVENOUS
  Filled 2023-05-13: qty 50

## 2023-05-13 NOTE — ED Triage Notes (Signed)
Pt was sent here from [facility] d/t Potassium results of 6.4 and BUN 134 today. Pt denies any pain

## 2023-05-13 NOTE — ED Provider Notes (Signed)
[facility] Provider Note   Event Date/Time   First MD Initiated Contact with Patient 05/13/23 2259     (approximate) History  Abnormal Lab (Pt was sent here from [facility] d/t Potassium results of 6.4 and BUN 134 today. Pt denies any pain)  HPI Sonya Sanchez is a 67 y.o. female with past medical history of hypertension, OSA, morbid obesity, and type 2 diabetes who presents from [facility] after she was found incidentally to have a potassium of 6.4 and be BUN of 134.  Patient is a very poor historian and states that she has had good p.o. intake but may have not been drinking enough water over the past few days.  Patient also endorses "a little" when asked about vomiting or diarrhea. ROS: Patient currently denies any vision changes, tinnitus, difficulty speaking, facial droop, sore throat, chest pain, shortness of breath, abdominal pain, dysuria, or weakness/numbness/paresthesias in any extremity   Physical Exam  Triage Vital Signs: ED Triage Vitals  Enc Vitals Group     BP 05/13/23 2248 (!) 113/50     Pulse Rate 05/13/23 2248 (!) 56     Resp 05/13/23 2248 (!) 22     Temp 05/13/23 2248 97.6 F (36.4 C)     Temp Source 05/13/23 2248 Oral     SpO2 05/13/23 2247 98 %     Weight 05/13/23 2250 292 lb 9.6 oz (132.7 kg)     Height 05/13/23 2250 5\' 2"  (1.575 m)     Head Circumference --      Peak Flow --      Pain Score 05/13/23 2250 0     Pain Loc --      Pain Edu? --      Excl. in GC? --    Most recent vital signs: Vitals:   05/13/23 2300 05/14/23 0251  BP: (!) 109/46 (!) 93/56  Pulse: (!) 57 (!) 58  Resp: 13 13  Temp:    SpO2: 100% 100%   General: Asleep on stretcher and easily awoken to voice. CV:  Good peripheral perfusion.  Resp:  Normal effort.  Abd:  No distention.  Other:  Morbidly obese Caucasian middle-aged female laying in bed in no acute distress ED Results / Procedures / Treatments   Labs (all labs ordered are listed, but only abnormal results are displayed) Labs Reviewed  BASIC METABOLIC PANEL - Abnormal; Notable for the following components:      Result Value   Sodium 128 (*)    Potassium 6.2 (*)    Chloride 92 (*)    CO2 21 (*)    Glucose, Bld 136 (*)    BUN 127 (*)    Creatinine, Ser 3.64 (*)    Calcium 7.0 (*)    GFR, Estimated 13 (*)    All other components within normal limits  CBC WITH DIFFERENTIAL/PLATELET - Abnormal; Notable for the following components:   RBC 3.22 (*)    Hemoglobin 9.6 (*)    HCT 29.7 (*)    RDW 19.2 (*)    Lymphs Abs 0.5 (*)    All other components within normal limits  BLOOD GAS, ARTERIAL - Abnormal; Notable for the following components:   pH, Arterial 7.14 (*)    pCO2 arterial 62 (*)    pO2, Arterial 110 (*)    Acid-base deficit 8.7 (*)    All other components within normal limits  OSMOLALITY - Abnormal; Notable for the following  components:   Osmolality 319 (*)    All other components within normal limits  PROTIME-INR - Abnormal; Notable for the following components:   Prothrombin Time 16.7 (*)    INR 1.3 (*)    All other components within normal limits  CBC - Abnormal; Notable for the following components:   RBC 3.04 (*)    Hemoglobin 9.1 (*)    HCT 28.3 (*)    RDW 19.5 (*)    All other components within normal limits  IRON AND TIBC - Abnormal; Notable for the following components:   TIBC 202 (*)    Saturation Ratios 47 (*)    All other components within normal limits  FERRITIN - Abnormal; Notable for the following components:   Ferritin 653 (*)    All other components within normal limits  RETICULOCYTES - Abnormal; Notable for the following components:   RBC. 2.99 (*)    All other components within normal limits  HEPATIC FUNCTION PANEL - Abnormal; Notable for the following components:   Total Protein 6.0 (*)    Albumin 3.1 (*)    AST 13 (*)    All other components within normal limits  BASIC METABOLIC  PANEL - Abnormal; Notable for the following components:   Sodium 128 (*)    Potassium 5.5 (*)    Chloride 93 (*)    CO2 21 (*)    Glucose, Bld 128 (*)    BUN 128 (*)    Creatinine, Ser 3.66 (*)    Calcium 6.8 (*)    GFR, Estimated 13 (*)    All other components within normal limits  CBG MONITORING, ED - Abnormal; Notable for the following components:   Glucose-Capillary 131 (*)    All other components within normal limits  APTT  FOLATE  URINALYSIS, ROUTINE W REFLEX MICROSCOPIC  SODIUM, URINE, RANDOM  CREATININE, URINE, RANDOM  HIV ANTIBODY (ROUTINE TESTING W REFLEX)  VITAMIN B12  PROTEIN ELECTROPHORESIS, SERUM  TYPE AND SCREEN  TYPE AND SCREEN   EKG ED ECG REPORT I, Merwyn Katos, the attending physician, personally viewed and interpreted this ECG. Date: 05/13/2023 EKG Time: 2255 Rate: 57 Rhythm: normal sinus rhythm QRS Axis: normal Intervals: normal ST/T Wave abnormalities: normal Narrative Interpretation: no evidence of acute ischemia RADIOLOGY ED MD interpretation: One-view portable chest x-ray interpreted by me shows no evidence of acute abnormalities including no pneumonia, pneumothorax, or widened mediastinum -Agree with radiology assessment Official radiology report(s): DG Chest Port 1 View  Result Date: 05/14/2023 CLINICAL DATA:  Preop EXAM: PORTABLE CHEST 1 VIEW COMPARISON:  02/27/2023 FINDINGS: Cardiomegaly, vascular congestion. Mediastinal contours within normal limits. Linear subsegmental atelectasis in the right mid lung. No overt edema or confluent opacities. No effusions or acute bony abnormality. IMPRESSION: Cardiomegaly, vascular congestion. Electronically Signed   By: Charlett Nose M.D.   On: 05/14/2023 01:34   CT ABDOMEN PELVIS WO CONTRAST  Result Date: 05/14/2023 CLINICAL DATA:  Kidney failure, acute EXAM: CT ABDOMEN AND PELVIS WITHOUT CONTRAST TECHNIQUE: Multidetector CT imaging of the abdomen and pelvis was performed following the standard protocol  without IV contrast. RADIATION DOSE REDUCTION: This exam was performed according to the departmental dose-optimization program which includes automated exposure control, adjustment of the mA and/or kV according to patient size and/or use of iterative reconstruction technique. COMPARISON:  None Available. FINDINGS: Lower chest: Small bilateral pleural effusions. Bilateral lower lobe airspace disease, left greater than right. Mild cardiomegaly. Diffuse coronary artery calcifications. Hepatobiliary: Layering high-density material within the gallbladder could  reflect small stones/gravel. No focal hepatic abnormality or biliary ductal dilatation. Pancreas: No focal abnormality or ductal dilatation. Spleen: No focal abnormality.  Normal size. Adrenals/Urinary Tract: No adrenal abnormality. No suspicious focal renal abnormality. Small exophytic cyst off the lower pole of the left kidney measuring 3 cm. No follow-up imaging recommended. No stones or hydronephrosis. Urinary bladder is unremarkable. Stomach/Bowel: Few scattered colonic diverticula. No active diverticulitis. Stomach, large and small bowel grossly unremarkable. Vascular/Lymphatic: Aortic atherosclerosis. No evidence of aneurysm or adenopathy. Reproductive: Prior hysterectomy.  No adnexal masses. Other: No free fluid or free air. Musculoskeletal: No acute bony abnormality. IMPRESSION: Small bilateral pleural effusions. Bibasilar atelectasis or infiltrates, left greater than right. Coronary artery disease, aortic atherosclerosis. Layering high-density material in the gallbladder could reflect small stones/gravel. No hydronephrosis. Scattered colonic diverticula. Electronically Signed   By: Charlett Nose M.D.   On: 05/14/2023 01:34   CT HEAD WO CONTRAST ( )  Result Date: 05/14/2023 CLINICAL DATA:  Elevated potassium, altered mental status EXAM: CT HEAD WITHOUT CONTRAST TECHNIQUE: Contiguous axial images were obtained from the base of the skull through the  vertex without intravenous contrast. RADIATION DOSE REDUCTION: This exam was performed according to the departmental dose-optimization program which includes automated exposure control, adjustment of the mA and/or kV according to patient size and/or use of iterative reconstruction technique. COMPARISON:  04/29/2023 FINDINGS: Brain: No evidence of acute infarction, hemorrhage, mass, mass effect, or midline shift. No hydrocephalus or extra-axial fluid collection. Periventricular white matter changes, likely the sequela of chronic small vessel ischemic disease. Vascular: No hyperdense vessel. Skull: Negative for fracture or focal lesion. Sinuses/Orbits: Air-fluid level in the right sphenoid sinus. Mild mucosal thickening in the ethmoid air cells and right maxillary sinus. Status post bilateral lens replacements. Other: The mastoid air cells are well aerated. IMPRESSION: No acute intracranial process. Electronically Signed   By: Wiliam Ke M.D.   On: 05/14/2023 01:27   PROCEDURES: Critical Care performed: Yes, see critical care procedure note(s) .1-3 Lead EKG Interpretation  Performed by: Merwyn Katos, MD Authorized by: Merwyn Katos, MD     Interpretation: normal     ECG rate:  71   ECG rate assessment: normal     Rhythm: sinus rhythm     Ectopy: none     Conduction: normal   CRITICAL CARE Performed by: Merwyn Katos  Total critical care time: 35 minutes  Critical care time was exclusive of separately billable procedures and treating other patients.  Critical care was necessary to treat or prevent imminent or life-threatening deterioration.  Critical care was time spent personally by me on the following activities: development of treatment plan with patient and/or surrogate as well as nursing, discussions with consultants, evaluation of patient's response to treatment, examination of patient, obtaining history from patient or surrogate, ordering and performing treatments and  interventions, ordering and review of laboratory studies, ordering and review of radiographic studies, pulse oximetry and re-evaluation of patient's condition.  MEDICATIONS ORDERED IN ED: Medications  albuterol (VENTOLIN HFA) 108 (90 Base) MCG/ACT inhaler 2 puff (0 puffs Inhalation Hold 05/14/23 0251)  sodium bicarbonate 150 mEq in dextrose 5 % 1,150 mL infusion ( Intravenous New Bag/Given 05/14/23 0251)  sodium chloride flush (NS) 0.9 % injection 3 mL (3 mLs Intravenous Given 05/14/23 0250)  acetaminophen (TYLENOL) tablet 650 mg (has no administration in time range)    Or  acetaminophen (TYLENOL) suppository 650 mg (has no administration in time range)  atorvastatin (LIPITOR) tablet 40 mg (has no  administration in time range)  insulin aspart (novoLOG) injection 0-15 Units (has no administration in time range)  insulin aspart (novoLOG) injection 0-5 Units (0 Units Subcutaneous Hold 05/14/23 0224)  insulin glargine-yfgn (SEMGLEE) injection 40 Units (has no administration in time range)  apixaban (ELIQUIS) tablet 5 mg (has no administration in time range)  fluticasone furoate-vilanterol (BREO ELLIPTA) 100-25 MCG/ACT 1 puff (has no administration in time range)  sodium chloride 0.9 % bolus 1,000 mL (0 mLs Intravenous Stopped 05/14/23 0201)  sodium zirconium cyclosilicate (LOKELMA) packet 10 g (10 g Oral Given 05/14/23 0040)  calcium gluconate 1 g/ 50 mL sodium chloride IVPB (0 mg Intravenous Stopped 05/14/23 0139)  insulin aspart (novoLOG) injection 10 Units (10 Units Intravenous Given 05/14/23 0037)  dextrose 50 % solution 50 mL (50 mLs Intravenous Given 05/14/23 0035)   IMPRESSION / MDM / ASSESSMENT AND PLAN / ED COURSE  I reviewed the triage vital signs and the nursing notes.                             The patient is on the cardiac monitor to evaluate for evidence of arrhythmia and/or significant heart rate changes. Patient's presentation is most consistent with acute presentation with potential  threat to life or bodily function. Workup: CBC, BMP, ECG Findings: ECG: shows no concerning T wave morphology, interval prolongation or other findings concerning for severe potassium related changes. Interventions:  Calcium Gluconate 10ml of a 10%  solution over 10 mins with close hemodynamic monitoring for potential osmotic shift. Insulin 10 units (regular) IV with 1 amp D50 Lokelma 10 mg  Not secondary to crush or thermal injury. Doubt TLS or DKA. Dispo: Admit to medicine   FINAL CLINICAL IMPRESSION(S) / ED DIAGNOSES   Final diagnoses:  Hyperkalemia  AKI (acute kidney injury) (HCC)   Rx / DC Orders   ED Discharge Orders     None      Note:  This document was prepared using Dragon voice recognition software and may include unintentional dictation errors.   Merwyn Katos, MD 05/14/23 [phone]

## 2023-05-14 ENCOUNTER — Inpatient Hospital Stay: Payer: Medicare Other

## 2023-05-14 DIAGNOSIS — J42 Unspecified chronic bronchitis: Secondary | ICD-10-CM | POA: Diagnosis present

## 2023-05-14 DIAGNOSIS — N170 Acute kidney failure with tubular necrosis: Secondary | ICD-10-CM | POA: Diagnosis present

## 2023-05-14 DIAGNOSIS — Y95 Nosocomial condition: Secondary | ICD-10-CM | POA: Diagnosis not present

## 2023-05-14 DIAGNOSIS — I2782 Chronic pulmonary embolism: Secondary | ICD-10-CM | POA: Diagnosis present

## 2023-05-14 DIAGNOSIS — E871 Hypo-osmolality and hyponatremia: Secondary | ICD-10-CM

## 2023-05-14 DIAGNOSIS — E875 Hyperkalemia: Secondary | ICD-10-CM

## 2023-05-14 DIAGNOSIS — I4891 Unspecified atrial fibrillation: Secondary | ICD-10-CM | POA: Diagnosis not present

## 2023-05-14 DIAGNOSIS — Z794 Long term (current) use of insulin: Secondary | ICD-10-CM | POA: Diagnosis not present

## 2023-05-14 DIAGNOSIS — R6521 Severe sepsis with septic shock: Secondary | ICD-10-CM | POA: Diagnosis not present

## 2023-05-14 DIAGNOSIS — N390 Urinary tract infection, site not specified: Secondary | ICD-10-CM | POA: Diagnosis present

## 2023-05-14 DIAGNOSIS — G4733 Obstructive sleep apnea (adult) (pediatric): Secondary | ICD-10-CM | POA: Diagnosis not present

## 2023-05-14 DIAGNOSIS — E8729 Other acidosis: Secondary | ICD-10-CM | POA: Diagnosis not present

## 2023-05-14 DIAGNOSIS — I350 Nonrheumatic aortic (valve) stenosis: Secondary | ICD-10-CM | POA: Diagnosis not present

## 2023-05-14 DIAGNOSIS — J9621 Acute and chronic respiratory failure with hypoxia: Secondary | ICD-10-CM | POA: Diagnosis present

## 2023-05-14 DIAGNOSIS — J9601 Acute respiratory failure with hypoxia: Secondary | ICD-10-CM | POA: Diagnosis not present

## 2023-05-14 DIAGNOSIS — I4819 Other persistent atrial fibrillation: Secondary | ICD-10-CM | POA: Diagnosis not present

## 2023-05-14 DIAGNOSIS — N179 Acute kidney failure, unspecified: Secondary | ICD-10-CM | POA: Insufficient documentation

## 2023-05-14 DIAGNOSIS — D649 Anemia, unspecified: Secondary | ICD-10-CM

## 2023-05-14 DIAGNOSIS — I080 Rheumatic disorders of both mitral and aortic valves: Secondary | ICD-10-CM | POA: Diagnosis present

## 2023-05-14 DIAGNOSIS — I2583 Coronary atherosclerosis due to lipid rich plaque: Secondary | ICD-10-CM | POA: Diagnosis not present

## 2023-05-14 DIAGNOSIS — E1165 Type 2 diabetes mellitus with hyperglycemia: Secondary | ICD-10-CM | POA: Diagnosis present

## 2023-05-14 DIAGNOSIS — A419 Sepsis, unspecified organism: Secondary | ICD-10-CM | POA: Diagnosis not present

## 2023-05-14 DIAGNOSIS — J9622 Acute and chronic respiratory failure with hypercapnia: Secondary | ICD-10-CM | POA: Diagnosis present

## 2023-05-14 DIAGNOSIS — J189 Pneumonia, unspecified organism: Secondary | ICD-10-CM | POA: Diagnosis not present

## 2023-05-14 DIAGNOSIS — M3313 Other dermatomyositis without myopathy: Secondary | ICD-10-CM | POA: Diagnosis present

## 2023-05-14 DIAGNOSIS — I110 Hypertensive heart disease with heart failure: Secondary | ICD-10-CM | POA: Diagnosis present

## 2023-05-14 DIAGNOSIS — I2510 Atherosclerotic heart disease of native coronary artery without angina pectoris: Secondary | ICD-10-CM | POA: Diagnosis not present

## 2023-05-14 DIAGNOSIS — I340 Nonrheumatic mitral (valve) insufficiency: Secondary | ICD-10-CM | POA: Diagnosis not present

## 2023-05-14 DIAGNOSIS — Z6841 Body Mass Index (BMI) 40.0 and over, adult: Secondary | ICD-10-CM | POA: Diagnosis not present

## 2023-05-14 DIAGNOSIS — G9349 Other encephalopathy: Secondary | ICD-10-CM | POA: Diagnosis present

## 2023-05-14 DIAGNOSIS — D509 Iron deficiency anemia, unspecified: Secondary | ICD-10-CM | POA: Diagnosis present

## 2023-05-14 DIAGNOSIS — R4182 Altered mental status, unspecified: Secondary | ICD-10-CM | POA: Diagnosis not present

## 2023-05-14 DIAGNOSIS — I2699 Other pulmonary embolism without acute cor pulmonale: Secondary | ICD-10-CM | POA: Diagnosis not present

## 2023-05-14 DIAGNOSIS — I214 Non-ST elevation (NSTEMI) myocardial infarction: Secondary | ICD-10-CM | POA: Diagnosis not present

## 2023-05-14 DIAGNOSIS — I5033 Acute on chronic diastolic (congestive) heart failure: Secondary | ICD-10-CM | POA: Diagnosis present

## 2023-05-14 DIAGNOSIS — Z1619 Resistance to other specified beta lactam antibiotics: Secondary | ICD-10-CM | POA: Diagnosis not present

## 2023-05-14 DIAGNOSIS — E662 Morbid (severe) obesity with alveolar hypoventilation: Secondary | ICD-10-CM | POA: Diagnosis present

## 2023-05-14 DIAGNOSIS — Z515 Encounter for palliative care: Secondary | ICD-10-CM | POA: Diagnosis not present

## 2023-05-14 DIAGNOSIS — I480 Paroxysmal atrial fibrillation: Secondary | ICD-10-CM | POA: Diagnosis not present

## 2023-05-14 DIAGNOSIS — Z7189 Other specified counseling: Secondary | ICD-10-CM | POA: Diagnosis not present

## 2023-05-14 LAB — CBC
HCT: 28.3 % — ABNORMAL LOW (ref 36.0–46.0)
Hemoglobin: 9.1 g/dL — ABNORMAL LOW (ref 12.0–15.0)
MCH: 29.9 pg (ref 26.0–34.0)
MCHC: 32.2 g/dL (ref 30.0–36.0)
MCV: 93.1 fL (ref 80.0–100.0)
Platelets: 205 10*3/uL (ref 150–400)
RBC: 3.04 MIL/uL — ABNORMAL LOW (ref 3.87–5.11)
RDW: 19.5 % — ABNORMAL HIGH (ref 11.5–15.5)
WBC: 6.7 10*3/uL (ref 4.0–10.5)
nRBC: 0 % (ref 0.0–0.2)

## 2023-05-14 LAB — CBC WITH DIFFERENTIAL/PLATELET
Abs Immature Granulocytes: 0.02 10*3/uL (ref 0.00–0.07)
Basophils Absolute: 0.1 10*3/uL (ref 0.0–0.1)
Basophils Relative: 1 %
Eosinophils Absolute: 0.2 10*3/uL (ref 0.0–0.5)
Eosinophils Relative: 3 %
HCT: 29.7 % — ABNORMAL LOW (ref 36.0–46.0)
Hemoglobin: 9.6 g/dL — ABNORMAL LOW (ref 12.0–15.0)
Immature Granulocytes: 0 %
Lymphocytes Relative: 9 %
Lymphs Abs: 0.5 10*3/uL — ABNORMAL LOW (ref 0.7–4.0)
MCH: 29.8 pg (ref 26.0–34.0)
MCHC: 32.3 g/dL (ref 30.0–36.0)
MCV: 92.2 fL (ref 80.0–100.0)
Monocytes Absolute: 0.4 10*3/uL (ref 0.1–1.0)
Monocytes Relative: 7 %
Neutro Abs: 4.9 10*3/uL (ref 1.7–7.7)
Neutrophils Relative %: 80 %
Platelets: 207 10*3/uL (ref 150–400)
RBC: 3.22 MIL/uL — ABNORMAL LOW (ref 3.87–5.11)
RDW: 19.2 % — ABNORMAL HIGH (ref 11.5–15.5)
WBC: 6 10*3/uL (ref 4.0–10.5)
nRBC: 0 % (ref 0.0–0.2)

## 2023-05-14 LAB — BLOOD GAS, ARTERIAL
Acid-base deficit: 8.7 mmol/L — ABNORMAL HIGH (ref 0.0–2.0)
Acid-base deficit: 6.1 mmol/L — ABNORMAL HIGH (ref 0.0–2.0)
Acid-base deficit: 3.1 mmol/L — ABNORMAL HIGH (ref 0.0–2.0)
Bicarbonate: 21.1 mmol/L (ref 20.0–28.0)
Bicarbonate: 22.4 mmol/L (ref 20.0–28.0)
Bicarbonate: 23.2 mmol/L (ref 20.0–28.0)
Delivery systems: POSITIVE
Expiratory PAP: 6 cmH2O
FIO2: 21 %
FIO2: 36 %
FIO2: 30 %
Inspiratory PAP: 22 cmH2O
MECHVT: 400 mL
Mechanical Rate: 16
Mechanical Rate: 16
O2 Content: 0 L/min
O2 Saturation: 98.7 %
O2 Saturation: 99.4 %
O2 Saturation: 95.6 %
PEEP: 5 cmH2O
Patient temperature: 37
Patient temperature: 37
Patient temperature: 37
pCO2 arterial: 62 mmHg — ABNORMAL HIGH (ref 32–48)
pCO2 arterial: 60 mmHg — ABNORMAL HIGH (ref 32–48)
pCO2 arterial: 45 mmHg (ref 32–48)
pH, Arterial: 7.14 — CL (ref 7.35–7.45)
pH, Arterial: 7.18 — CL (ref 7.35–7.45)
pH, Arterial: 7.32 — ABNORMAL LOW (ref 7.35–7.45)
pO2, Arterial: 110 mmHg — ABNORMAL HIGH (ref 83–108)
pO2, Arterial: 122 mmHg — ABNORMAL HIGH (ref 83–108)
pO2, Arterial: 69 mmHg — ABNORMAL LOW (ref 83–108)

## 2023-05-14 LAB — COMPREHENSIVE METABOLIC PANEL
ALT: 9 U/L (ref 0–44)
ALT: 10 U/L (ref 0–44)
AST: 20 U/L (ref 15–41)
AST: 11 U/L — ABNORMAL LOW (ref 15–41)
Albumin: 2.9 g/dL — ABNORMAL LOW (ref 3.5–5.0)
Albumin: 3.3 g/dL — ABNORMAL LOW (ref 3.5–5.0)
Alkaline Phosphatase: 41 U/L (ref 38–126)
Alkaline Phosphatase: 45 U/L (ref 38–126)
Anion gap: 15 (ref 5–15)
Anion gap: 17 — ABNORMAL HIGH (ref 5–15)
BUN: 142 mg/dL — ABNORMAL HIGH (ref 8–23)
BUN: 134 mg/dL — ABNORMAL HIGH (ref 8–23)
CO2: 22 mmol/L (ref 22–32)
CO2: 21 mmol/L — ABNORMAL LOW (ref 22–32)
Calcium: 6.7 mg/dL — ABNORMAL LOW (ref 8.9–10.3)
Calcium: 7.1 mg/dL — ABNORMAL LOW (ref 8.9–10.3)
Chloride: 90 mmol/L — ABNORMAL LOW (ref 98–111)
Chloride: 92 mmol/L — ABNORMAL LOW (ref 98–111)
Creatinine, Ser: 3.64 mg/dL — ABNORMAL HIGH (ref 0.44–1.00)
Creatinine, Ser: 3.42 mg/dL — ABNORMAL HIGH (ref 0.44–1.00)
GFR, Estimated: 13 mL/min — ABNORMAL LOW (ref >60)
GFR, Estimated: 14 mL/min — ABNORMAL LOW (ref >60)
Glucose, Bld: 142 mg/dL — ABNORMAL HIGH (ref 70–99)
Glucose, Bld: 122 mg/dL — ABNORMAL HIGH (ref 70–99)
Potassium: 6.2 mmol/L — ABNORMAL HIGH (ref 3.5–5.1)
Potassium: 5.3 mmol/L — ABNORMAL HIGH (ref 3.5–5.1)
Sodium: 127 mmol/L — ABNORMAL LOW (ref 135–145)
Sodium: 130 mmol/L — ABNORMAL LOW (ref 135–145)
Total Bilirubin: 1.1 mg/dL (ref 0.3–1.2)
Total Bilirubin: 0.7 mg/dL (ref 0.3–1.2)
Total Protein: 5.8 g/dL — ABNORMAL LOW (ref 6.5–8.1)
Total Protein: 6.2 g/dL — ABNORMAL LOW (ref 6.5–8.1)

## 2023-05-14 LAB — IRON AND TIBC
Iron: 95 ug/dL (ref 28–170)
Saturation Ratios: 47 % — ABNORMAL HIGH (ref 10.4–31.8)
TIBC: 202 ug/dL — ABNORMAL LOW (ref 250–450)
UIBC: 107 ug/dL

## 2023-05-14 LAB — GLUCOSE, CAPILLARY
Glucose-Capillary: 103 mg/dL — ABNORMAL HIGH (ref 70–99)
Glucose-Capillary: 124 mg/dL — ABNORMAL HIGH (ref 70–99)
Glucose-Capillary: 126 mg/dL — ABNORMAL HIGH (ref 70–99)

## 2023-05-14 LAB — URINALYSIS, ROUTINE W REFLEX MICROSCOPIC
Bilirubin Urine: NEGATIVE
Glucose, UA: 50 mg/dL — AB
Ketones, ur: NEGATIVE mg/dL
Nitrite: NEGATIVE
Protein, ur: 30 mg/dL — AB
Specific Gravity, Urine: 1.013 (ref 1.005–1.030)
WBC, UA: >50 WBC/hpf (ref 0–5)
pH: 5 (ref 5.0–8.0)

## 2023-05-14 LAB — BASIC METABOLIC PANEL
Anion gap: 14 (ref 5–15)
Anion gap: 14 (ref 5–15)
BUN: 128 mg/dL — ABNORMAL HIGH (ref 8–23)
BUN: 127 mg/dL — ABNORMAL HIGH (ref 8–23)
CO2: 21 mmol/L — ABNORMAL LOW (ref 22–32)
CO2: 20 mmol/L — ABNORMAL LOW (ref 22–32)
Calcium: 6.8 mg/dL — ABNORMAL LOW (ref 8.9–10.3)
Calcium: 6.9 mg/dL — ABNORMAL LOW (ref 8.9–10.3)
Chloride: 93 mmol/L — ABNORMAL LOW (ref 98–111)
Chloride: 95 mmol/L — ABNORMAL LOW (ref 98–111)
Creatinine, Ser: 3.66 mg/dL — ABNORMAL HIGH (ref 0.44–1.00)
Creatinine, Ser: 3.15 mg/dL — ABNORMAL HIGH (ref 0.44–1.00)
GFR, Estimated: 13 mL/min — ABNORMAL LOW (ref >60)
GFR, Estimated: 15 mL/min — ABNORMAL LOW (ref >60)
Glucose, Bld: 128 mg/dL — ABNORMAL HIGH (ref 70–99)
Glucose, Bld: 137 mg/dL — ABNORMAL HIGH (ref 70–99)
Potassium: 5.5 mmol/L — ABNORMAL HIGH (ref 3.5–5.1)
Potassium: 5.4 mmol/L — ABNORMAL HIGH (ref 3.5–5.1)
Sodium: 128 mmol/L — ABNORMAL LOW (ref 135–145)
Sodium: 129 mmol/L — ABNORMAL LOW (ref 135–145)

## 2023-05-14 LAB — TYPE AND SCREEN
ABO/RH(D): A POS
Antibody Screen: NEGATIVE

## 2023-05-14 LAB — FOLATE: Folate: 15 ng/mL (ref >5.9)

## 2023-05-14 LAB — CBG MONITORING, ED
Glucose-Capillary: 131 mg/dL — ABNORMAL HIGH (ref 70–99)
Glucose-Capillary: 126 mg/dL — ABNORMAL HIGH (ref 70–99)
Glucose-Capillary: 113 mg/dL — ABNORMAL HIGH (ref 70–99)

## 2023-05-14 LAB — SODIUM, URINE, RANDOM: Sodium, Ur: 25 mmol/L

## 2023-05-14 LAB — RETICULOCYTES
Immature Retic Fract: 7.4 % (ref 2.3–15.9)
RBC.: 2.99 MIL/uL — ABNORMAL LOW (ref 3.87–5.11)
Retic Count, Absolute: 76.8 10*3/uL (ref 19.0–186.0)
Retic Ct Pct: 2.6 % (ref 0.4–3.1)

## 2023-05-14 LAB — OSMOLALITY: Osmolality: 319 mOsm/kg — ABNORMAL HIGH (ref 275–295)

## 2023-05-14 LAB — HEPATIC FUNCTION PANEL
ALT: 9 U/L (ref 0–44)
AST: 13 U/L — ABNORMAL LOW (ref 15–41)
Albumin: 3.1 g/dL — ABNORMAL LOW (ref 3.5–5.0)
Alkaline Phosphatase: 42 U/L (ref 38–126)
Bilirubin, Direct: 0.2 mg/dL (ref 0.0–0.2)
Indirect Bilirubin: 0.5 mg/dL (ref 0.3–0.9)
Total Bilirubin: 0.7 mg/dL (ref 0.3–1.2)
Total Protein: 6 g/dL — ABNORMAL LOW (ref 6.5–8.1)

## 2023-05-14 LAB — CREATININE, URINE, RANDOM: Creatinine, Urine: 87 mg/dL

## 2023-05-14 LAB — SEDIMENTATION RATE: Sed Rate: 71 mm/hr — ABNORMAL HIGH (ref 0–30)

## 2023-05-14 LAB — APTT: aPTT: 35 seconds (ref 24–36)

## 2023-05-14 LAB — FERRITIN: Ferritin: 653 ng/mL — ABNORMAL HIGH (ref 11–307)

## 2023-05-14 LAB — CK: Total CK: 59 U/L (ref 38–234)

## 2023-05-14 LAB — HIV ANTIBODY (ROUTINE TESTING W REFLEX): HIV Screen 4th Generation wRfx: NONREACTIVE

## 2023-05-14 LAB — VITAMIN B12: Vitamin B-12: 582 pg/mL (ref 180–914)

## 2023-05-14 LAB — PROTIME-INR
INR: 1.3 — ABNORMAL HIGH (ref 0.8–1.2)
Prothrombin Time: 16.7 seconds — ABNORMAL HIGH (ref 11.4–15.2)

## 2023-05-14 LAB — MRSA NEXT GEN BY PCR, NASAL: MRSA by PCR Next Gen: DETECTED — AB

## 2023-05-14 MED ORDER — NOREPINEPHRINE 4 MG/250ML-% IV SOLN
INTRAVENOUS | Status: AC
  Filled 2023-05-14: qty 250

## 2023-05-14 MED ORDER — INSULIN ASPART 100 UNIT/ML IJ SOLN: 0.0000 [IU] | Freq: Every day | INTRAMUSCULAR | Status: DC

## 2023-05-14 MED ORDER — FLUTICASONE FUROATE-VILANTEROL 100-25 MCG/ACT IN AEPB
1.0000 | INHALATION_SPRAY | Freq: Every day | RESPIRATORY_TRACT | Status: DC
  Administered 2023-05-14 – 2023-05-27 (×9): 1 via RESPIRATORY_TRACT
  Filled 2023-05-14 (×2): qty 28

## 2023-05-14 MED ORDER — SODIUM CHLORIDE 0.9 % IV SOLN: INTRAVENOUS | Status: DC

## 2023-05-14 MED ORDER — INSULIN GLARGINE-YFGN 100 UNIT/ML ~~LOC~~ SOLN
40.0000 [IU] | Freq: Every day | SUBCUTANEOUS | Status: DC
  Filled 2023-05-14: qty 0.4

## 2023-05-14 MED ORDER — ACETAMINOPHEN 650 MG RE SUPP: 650.0000 mg | Freq: Four times a day (QID) | RECTAL | Status: DC | PRN

## 2023-05-14 MED ORDER — INSULIN ASPART 100 UNIT/ML IJ SOLN
0.0000 [IU] | INTRAMUSCULAR | Status: DC
  Administered 2023-05-16 (×3): 1 [IU] via SUBCUTANEOUS
  Administered 2023-05-17: 3 [IU] via SUBCUTANEOUS
  Administered 2023-05-17: 1 [IU] via SUBCUTANEOUS
  Administered 2023-05-17: 3 [IU] via SUBCUTANEOUS
  Administered 2023-05-17: 2 [IU] via SUBCUTANEOUS
  Administered 2023-05-17: 3 [IU] via SUBCUTANEOUS
  Filled 2023-05-14 (×8): qty 1

## 2023-05-14 MED ORDER — FENTANYL 2500MCG IN NS 250ML (10MCG/ML) PREMIX INFUSION
0.0000 ug/h | INTRAVENOUS | Status: DC
  Administered 2023-05-15 – 2023-05-16 (×2): 150 ug/h via INTRAVENOUS
  Filled 2023-05-14 (×3): qty 250

## 2023-05-14 MED ORDER — FENTANYL CITRATE PF 50 MCG/ML IJ SOSY
PREFILLED_SYRINGE | INTRAMUSCULAR | Status: AC
  Filled 2023-05-14: qty 3

## 2023-05-14 MED ORDER — MIDAZOLAM HCL 2 MG/2ML IJ SOLN
2.0000 mg | Freq: Once | INTRAMUSCULAR | Status: AC
  Administered 2023-05-14: 2 mg via INTRAVENOUS

## 2023-05-14 MED ORDER — ROCURONIUM BROMIDE 50 MG/5ML IV SOLN
INTRAVENOUS | Status: AC | PRN
  Administered 2023-05-14: 40 mg via INTRAVENOUS

## 2023-05-14 MED ORDER — DEXTROSE 50 % IV SOLN
1.0000 | Freq: Once | INTRAVENOUS | Status: AC
  Administered 2023-05-14: 50 mL via INTRAVENOUS
  Filled 2023-05-14: qty 50

## 2023-05-14 MED ORDER — ORAL CARE MOUTH RINSE: 15.0000 mL | OROMUCOSAL | Status: DC | PRN

## 2023-05-14 MED ORDER — CHLORHEXIDINE GLUCONATE CLOTH 2 % EX PADS
6.0000 | MEDICATED_PAD | Freq: Every day | CUTANEOUS | Status: DC
  Administered 2023-05-14 – 2023-05-19 (×6): 6 via TOPICAL

## 2023-05-14 MED ORDER — FUROSEMIDE 10 MG/ML IJ SOLN
10.0000 mg/h | INTRAVENOUS | Status: DC
  Administered 2023-05-14 – 2023-05-16 (×2): 5 mg/h via INTRAVENOUS
  Administered 2023-05-17 (×2): 10 mg/h via INTRAVENOUS
  Filled 2023-05-14 (×5): qty 20

## 2023-05-14 MED ORDER — PROPOFOL 1000 MG/100ML IV EMUL
INTRAVENOUS | Status: AC
  Filled 2023-05-14: qty 100

## 2023-05-14 MED ORDER — FUROSEMIDE 10 MG/ML IJ SOLN
20.0000 mg | Freq: Once | INTRAMUSCULAR | Status: AC
  Administered 2023-05-14: 20 mg via INTRAVENOUS
  Filled 2023-05-14: qty 4

## 2023-05-14 MED ORDER — SODIUM CHLORIDE 0.9% FLUSH
3.0000 mL | Freq: Two times a day (BID) | INTRAVENOUS | Status: DC
  Administered 2023-05-14 – 2023-05-27 (×27): 3 mL via INTRAVENOUS

## 2023-05-14 MED ORDER — INSULIN ASPART 100 UNIT/ML IJ SOLN
10.0000 [IU] | Freq: Once | INTRAMUSCULAR | Status: AC
  Administered 2023-05-14: 10 [IU] via INTRAVENOUS
  Filled 2023-05-14: qty 1

## 2023-05-14 MED ORDER — ORAL CARE MOUTH RINSE
15.0000 mL | OROMUCOSAL | Status: DC
  Administered 2023-05-14 – 2023-05-19 (×54): 15 mL via OROMUCOSAL

## 2023-05-14 MED ORDER — ACETAMINOPHEN 325 MG PO TABS
650.0000 mg | ORAL_TABLET | Freq: Four times a day (QID) | ORAL | Status: DC | PRN
  Administered 2023-05-22 – 2023-05-26 (×3): 650 mg via ORAL
  Filled 2023-05-14 (×3): qty 2

## 2023-05-14 MED ORDER — APIXABAN 5 MG PO TABS
5.0000 mg | ORAL_TABLET | Freq: Two times a day (BID) | ORAL | Status: DC
  Administered 2023-05-14: 5 mg via ORAL
  Filled 2023-05-14: qty 1

## 2023-05-14 MED ORDER — NOREPINEPHRINE 4 MG/250ML-% IV SOLN
INTRAVENOUS | Status: AC | PRN
  Administered 2023-05-14: 5 ug/min via INTRAVENOUS

## 2023-05-14 MED ORDER — NOREPINEPHRINE 4 MG/250ML-% IV SOLN
0.0000 ug/min | INTRAVENOUS | Status: DC
  Administered 2023-05-14: 10 ug/min via INTRAVENOUS
  Administered 2023-05-15: 8 ug/min via INTRAVENOUS
  Administered 2023-05-15: 7 ug/min via INTRAVENOUS
  Administered 2023-05-15 – 2023-05-16 (×2): 6 ug/min via INTRAVENOUS
  Administered 2023-05-16 (×2): 10 ug/min via INTRAVENOUS
  Administered 2023-05-17 (×2): 12 ug/min via INTRAVENOUS
  Administered 2023-05-17: 10 ug/min via INTRAVENOUS
  Administered 2023-05-17: 14 ug/min via INTRAVENOUS
  Administered 2023-05-18: 6 ug/min via INTRAVENOUS
  Filled 2023-05-14 (×12): qty 250

## 2023-05-14 MED ORDER — SODIUM BICARBONATE 8.4 % IV SOLN
INTRAVENOUS | Status: DC
  Filled 2023-05-14: qty 150

## 2023-05-14 MED ORDER — INSULIN ASPART 100 UNIT/ML IJ SOLN
0.0000 [IU] | Freq: Three times a day (TID) | INTRAMUSCULAR | Status: DC
  Administered 2023-05-14: 2 [IU] via SUBCUTANEOUS
  Filled 2023-05-14: qty 1

## 2023-05-14 MED ORDER — MIDAZOLAM HCL 2 MG/2ML IJ SOLN
INTRAMUSCULAR | Status: AC
  Filled 2023-05-14: qty 2

## 2023-05-14 MED ORDER — ATORVASTATIN CALCIUM 20 MG PO TABS
40.0000 mg | ORAL_TABLET | Freq: Every day | ORAL | Status: DC
  Administered 2023-05-14 – 2023-05-18 (×5): 40 mg via ORAL
  Filled 2023-05-14 (×5): qty 2

## 2023-05-14 MED ORDER — FENTANYL 2500MCG IN NS 250ML (10MCG/ML) PREMIX INFUSION
INTRAVENOUS | Status: AC
  Administered 2023-05-14: 50 ug/h via INTRAVENOUS
  Filled 2023-05-14: qty 250

## 2023-05-14 MED ORDER — FENTANYL CITRATE PF 50 MCG/ML IJ SOSY
PREFILLED_SYRINGE | INTRAMUSCULAR | Status: AC | PRN
  Administered 2023-05-14: 150 ug via INTRAVENOUS

## 2023-05-14 MED ORDER — ALBUTEROL SULFATE HFA 108 (90 BASE) MCG/ACT IN AERS
2.0000 | INHALATION_SPRAY | RESPIRATORY_TRACT | Status: AC
  Filled 2023-05-14: qty 6.7

## 2023-05-14 MED ORDER — ROCURONIUM BROMIDE 10 MG/ML (PF) SYRINGE
PREFILLED_SYRINGE | INTRAVENOUS | Status: AC
  Filled 2023-05-14: qty 10

## 2023-05-14 MED ORDER — PROPOFOL 1000 MG/100ML IV EMUL
5.0000 ug/kg/min | INTRAVENOUS | Status: DC
  Administered 2023-05-14: 10 ug/kg/min via INTRAVENOUS
  Administered 2023-05-15: 25 ug/kg/min via INTRAVENOUS
  Administered 2023-05-15 – 2023-05-16 (×2): 20 ug/kg/min via INTRAVENOUS
  Administered 2023-05-16: 10 ug/kg/min via INTRAVENOUS
  Administered 2023-05-16 – 2023-05-17 (×2): 20 ug/kg/min via INTRAVENOUS
  Administered 2023-05-17: 30 ug/kg/min via INTRAVENOUS
  Administered 2023-05-17 (×2): 20 ug/kg/min via INTRAVENOUS
  Administered 2023-05-18 (×2): 30 ug/kg/min via INTRAVENOUS
  Filled 2023-05-14 (×12): qty 100

## 2023-05-14 NOTE — Assessment & Plan Note (Addendum)
Restart patient's metoprolol in the a.m. as indicated.  Patient's blood pressure is soft right now.  hOld losartan and spironolactone due to hyperkalemia

## 2023-05-14 NOTE — Consult Note (Addendum)
Cardiology Consultation   Patient ID: Sonya Sanchez MRN: [id number]; DOB: [birth year]  Admit date: 05/13/2023 Date of Consult: 05/14/2023  PCP:  Aviva Kluver   [facility] Providers Cardiologist:  Julien Nordmann, MD   {  Patient Profile:   Sonya Sanchez is a 67 y.o. female with a hx of CAD, chronic renal failure, and morbid obesity who is being seen 05/14/2023 for the evaluation of worsening CHF at the request of Dr. Lynne Logan.  History of Present Illness:   Ms. Giasson has a long medical history as noted below who was admitted 2 weeks ago and underwent left heart cath by Dr. Okey Dupre. 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 She also has mod AS. Her LVEDP was elevated at 20. The patient was discharged home. She cannot give me any history. She is on bipap. Her CO2 was 60 6 hours ago. She is hypotensive.    Past Medical History:  Diagnosis Date   Calculus of kidney    Diabetes mellitus without complication (HCC)    DVT (deep venous thrombosis) (HCC)    a. on Eliquis   Family history of early CAD    a. parents passing in their 7's from CAD   Gout, unspecified    Heart murmur    Hyperlipidemia    Hypertension    Morbid obesity (HCC)    Normal cardiac stress test    a. equivocal study, sig soft tissue artifact present, no chest discomfort or ECG changes, perfusion images suggest mod sized region of mild reversible perfusion defect. Findings may be 2/2 shifting soft tissue attenuation, but cannot r/o ischemia, EF 72%   Phlebitis and thrombophlebitis of other deep vessels of lower extremities    Phlebitis and thrombophlebitis of other deep vessels of lower extremities    Pulmonary emboli (HCC)    a. on Eliquis   Spinal stenosis, unspecified region other than cervical    Tobacco abuse    Type II or unspecified type diabetes mellitus without mention of complication, uncontrolled     Unspecified sleep apnea    Urinary tract infection, site not specified     Past Surgical History:  Procedure Laterality Date   ABDOMINAL HYSTERECTOMY     hyperplasia of endometrium   ANKLE SURGERY     fracture s/p pin, right ankle   APPENDECTOMY     CHOLECYSTECTOMY     COLECTOMY     temporary colostomy, now reversed   INTESTINAL BYPASS     ovarian cyst ruptured, led to perforated intestine   LEFT HEART CATH AND CORONARY ANGIOGRAPHY N/A 05/04/2023   Procedure: LEFT HEART CATH AND CORONARY ANGIOGRAPHY;  Surgeon: Yvonne Kendall, MD;  Location: [facility] INVASIVE CV LAB;  Service: Cardiovascular;  Laterality: N/A;   STOMACH SURGERY         Inpatient Medications: Scheduled Meds:  apixaban  5 mg Oral BID   atorvastatin  40 mg Oral Daily   fluticasone furoate-vilanterol  1 puff Inhalation Daily   insulin aspart  0-15 Units Subcutaneous TID WC   insulin aspart  0-5 Units Subcutaneous QHS   insulin glargine-yfgn  40 Units Subcutaneous QHS   sodium chloride flush  3 mL Intravenous Q12H   Continuous Infusions:  sodium chloride 75 mL/hr at 05/14/23 1049   PRN Meds: acetaminophen **OR** acetaminophen  Allergies:    Allergies  Allergen Reactions   Sulfa Antibiotics Other (See  Comments)    Reaction: isn't certain, thinks she ran a fever, or had a rash, maybe both.    Social History:   Social History   Socioeconomic History   Marital status: Single    Spouse name: Not on file   Number of children: Not on file   Years of education: Not on file   Highest education level: Not on file  Occupational History   Not on file  Tobacco Use   Smoking status: Former    Packs/day: 1.00    Years: 10.00    Additional pack years: 0.00    Total pack years: 10.00    Types: Cigarettes    Quit date: 12/26/1999    Years since quitting: 23.3   Smokeless tobacco: Never  Substance and Sexual Activity   Alcohol use: Yes    Comment: socially   Drug use: No   Sexual activity: Not Currently   Other Topics Concern   Not on file  Social History Narrative   Lives in [place], [place] summer (near the [place]), [place] Dec-Mar.  Sister and children live here in [place]      Work - Retired Designer, television/film set      Diet - regular      Exercise - walks, limited by knee and ankle pain   Social Determinants of Corporate investment banker Strain: Not on file  Food Insecurity: No Food Insecurity (04/30/2023)   Hunger Vital Sign    Worried About Running Out of Food in the Last Year: Never true    Ran Out of Food in the Last Year: Never true  Transportation Needs: No Transportation Needs (04/30/2023)   PRAPARE - Administrator, Civil Service (Medical): No    Lack of Transportation (Non-Medical): No  Physical Activity: Not on file  Stress: Not on file  Social Connections: Not on file  Intimate Partner Violence: Not At Risk (04/30/2023)   Humiliation, Afraid, Rape, and Kick questionnaire    Fear of Current or Ex-Partner: No    Emotionally Abused: No    Physically Abused: No    Sexually Abused: No    Family History:    Family History  Problem Relation Age of Onset   Heart attack Mother 45   Cancer Mother        breast   Heart attack Father 32   Arthritis Sister      ROS:  Please see the history of present illness.   All other ROS reviewed and negative.     Physical Exam/Data:   Vitals:   05/14/23 1109 05/14/23 1145 05/14/23 1200 05/14/23 1313  BP: (!) 80/61 (!) 103/42 (!) 90/57   Pulse: 67 (!) 57 (!) 54   Resp:  20 20   Temp:    (!) 97.5 F (36.4 C)  TempSrc:    Axillary  SpO2:  100% 100%   Weight:      Height:       No intake or output data in the 24 hours ending 05/14/23 1338    05/13/2023   10:50 PM 04/29/2023    3:03 PM 01/27/2018   10:46 AM  Last 3 Weights  Weight (lbs) 292 lb 9.6 oz 293 lb 3.4 oz 300 lb 3.2 oz  Weight (kg) 132.722 kg 133 kg 136.17 kg     Body mass index is 53.52 kg/m.  General:  massively obese, NAD HEENT: Bipap in place. Neck:  unable to assess JVD Vascular: No carotid bruits; Distal pulses  2+ bilaterally Cardiac:  distant. Lungs:  scattered rales and rhonchi  Abd: obese Ext: 3+ edema Musculoskeletal:  No deformities, BUE and BLE strength normal and equal Skin: warm and dry  Neuro:  CNs 2-12 intact, no focal abnormalities noted Psych:  Normal affect   EKG:  The EKG was personally reviewed and demonstrates:  NSR Telemetry:  Telemetry was personally reviewed and demonstrates:  nsr  Relevant CV Studies: Heart cath reviewed  Laboratory Data:  High Sensitivity Troponin:   Recent Labs  Lab 04/30/23 1300 04/30/23 1437 05/01/23 0401 05/01/23 0908 05/01/23 1221  TROPONINIHS 1,678* 1,669* 958* 904* 859*     Chemistry Recent Labs  Lab 05/13/23 2259 05/14/23 0202 05/14/23 1030  NA 128* 128* 127*  K 6.2* 5.5* 6.2*  CL 92* 93* 90*  CO2 21* 21* 22  GLUCOSE 136* 128* 142*  BUN 127* 128* 142*  CREATININE 3.64* 3.66* 3.64*  CALCIUM 7.0* 6.8* 6.7*  GFRNONAA 13* 13* 13*  ANIONGAP 15 14 15     Recent Labs  Lab 05/14/23 0202 05/14/23 1030  PROT 6.0* 5.8*  ALBUMIN 3.1* 2.9*  AST 13* 20  ALT 9 9  ALKPHOS 42 41  BILITOT 0.7 1.1   Lipids No results for input(s): "CHOL", "TRIG", "HDL", "LABVLDL", "LDLCALC", "CHOLHDL" in the last 168 hours.  Hematology Recent Labs  Lab 05/13/23 2259 05/14/23 0202  WBC 6.0 6.7  RBC 3.22* 3.04*  2.99*  HGB 9.6* 9.1*  HCT 29.7* 28.3*  MCV 92.2 93.1  MCH 29.8 29.9  MCHC 32.3 32.2  RDW 19.2* 19.5*  PLT 207 205   Thyroid No results for input(s): "TSH", "FREET4" in the last 168 hours.  BNPNo results for input(s): "BNP", "PROBNP" in the last 168 hours.  DDimer No results for input(s): "DDIMER" in the last 168 hours.   Radiology/Studies:  DG Chest Port 1 View  Result Date: 05/14/2023 CLINICAL DATA:  Preop EXAM: PORTABLE CHEST 1 VIEW COMPARISON:  02/27/2023 FINDINGS: Cardiomegaly, vascular congestion. Mediastinal contours within normal limits. Linear subsegmental  atelectasis in the right mid lung. No overt edema or confluent opacities. No effusions or acute bony abnormality. IMPRESSION: Cardiomegaly, vascular congestion. Electronically Signed   By: Charlett Nose M.D.   On: 05/14/2023 01:34   CT ABDOMEN PELVIS WO CONTRAST  Result Date: 05/14/2023 CLINICAL DATA:  Kidney failure, acute EXAM: CT ABDOMEN AND PELVIS WITHOUT CONTRAST TECHNIQUE: Multidetector CT imaging of the abdomen and pelvis was performed following the standard protocol without IV contrast. RADIATION DOSE REDUCTION: This exam was performed according to the departmental dose-optimization program which includes automated exposure control, adjustment of the mA and/or kV according to patient size and/or use of iterative reconstruction technique. COMPARISON:  None Available. FINDINGS: Lower chest: Small bilateral pleural effusions. Bilateral lower lobe airspace disease, left greater than right. Mild cardiomegaly. Diffuse coronary artery calcifications. Hepatobiliary: Layering high-density material within the gallbladder could reflect small stones/gravel. No focal hepatic abnormality or biliary ductal dilatation. Pancreas: No focal abnormality or ductal dilatation. Spleen: No focal abnormality.  Normal size. Adrenals/Urinary Tract: No adrenal abnormality. No suspicious focal renal abnormality. Small exophytic cyst off the lower pole of the left kidney measuring 3 cm. No follow-up imaging recommended. No stones or hydronephrosis. Urinary bladder is unremarkable. Stomach/Bowel: Few scattered colonic diverticula. No active diverticulitis. Stomach, large and small bowel grossly unremarkable. Vascular/Lymphatic: Aortic atherosclerosis. No evidence of aneurysm or adenopathy. Reproductive: Prior hysterectomy.  No adnexal masses. Other: No free fluid or free air. Musculoskeletal: No acute bony abnormality. IMPRESSION: Small bilateral  pleural effusions. Bibasilar atelectasis or infiltrates, left greater than right. Coronary  artery disease, aortic atherosclerosis. Layering high-density material in the gallbladder could reflect small stones/gravel. No hydronephrosis. Scattered colonic diverticula. Electronically Signed   By: Charlett Nose M.D.   On: 05/14/2023 01:34   CT HEAD WO CONTRAST ( )  Result Date: 05/14/2023 CLINICAL DATA:  Elevated potassium, altered mental status EXAM: CT HEAD WITHOUT CONTRAST TECHNIQUE: Contiguous axial images were obtained from the base of the skull through the vertex without intravenous contrast. RADIATION DOSE REDUCTION: This exam was performed according to the departmental dose-optimization program which includes automated exposure control, adjustment of the mA and/or kV according to patient size and/or use of iterative reconstruction technique. COMPARISON:  04/29/2023 FINDINGS: Brain: No evidence of acute infarction, hemorrhage, mass, mass effect, or midline shift. No hydrocephalus or extra-axial fluid collection. Periventricular white matter changes, likely the sequela of chronic small vessel ischemic disease. Vascular: No hyperdense vessel. Skull: Negative for fracture or focal lesion. Sinuses/Orbits: Air-fluid level in the right sphenoid sinus. Mild mucosal thickening in the ethmoid air cells and right maxillary sinus. Status post bilateral lens replacements. Other: The mastoid air cells are well aerated. IMPRESSION: No acute intracranial process. Electronically Signed   By: Wiliam Ke M.D.   On: 05/14/2023 01:27     Assessment and Plan:   Acute on chronic diastolic heart failure - she will need high dose IV lasix.  Hyperkalemia - treat Hypercarbia - she has ventilatory failure. I would consider CCM to see regarding intubation. She will need it sooner than later unless she responds to the lasix. She needs her fio2 reduced. Mobid obesity - contributing to hypoventilation.  CAD - no invasive treatment.   For questions or updates, please contact [facility] Please consult  [email or web address] for contact info under   Signed, Lewayne Bunting, MD  05/14/2023 1:38 PM

## 2023-05-14 NOTE — ED Notes (Signed)
Labs, including type and screen, sent to lab.

## 2023-05-14 NOTE — H&P (Signed)
History and Physical  Morbid obesity  Patient: Sonya Sanchez WUJ:[id number] DOB: 1956/09/03 DOA: 05/13/2023 DOS: the patient was seen and examined on 05/14/2023 PCP: Pcp, No  Patient coming from: SNF  Chief Complaint:  Chief Complaint  Patient presents with   Abnormal Lab    Pt was sent here from [facility] SNF d/t Potassium results of 6.4 and BUN 134 today. Pt denies any pain   HPI: Sonya Sanchez is a 67 y.o. female with medical history significant of morbid obesity, limited functional capacity, residence of nursing home.  Patient was apparently tested by routine blood test and found to have acute kidney injury and hyperkalemia today.  Review of documentation does not entirely explain why the blood was tested today considering that the blood test was recently done about 6 days ago.  It seems that the reason might have been that her creatinine was trending up since then.  Per review of documentation including the EMS run sheet patient was alert and awake at the time of initial encounter and did not appear to be in any distress and had no symptoms.  Patient does not have any documented reports of diarrhea or vomiting.  Since arrival in the ER patient seems to have become more sleepy/somnolent.  And patient was not really engaging in much of a conversation during this encounter.  Patient will also not oriented to the year.  Patient was not able to tell me where she was and when I asked her date of birth, she told me Apr 28, 2021.  Patient offers no complaints such as chest pain rash on skin any discomfort.  But is mostly sleeping.  Repeat lab testing in the ER has confirmed hyperkalemia and AKI medical evaluation is sought Review of Systems: unable to review all systems due to the inability of the patient to answer questions. Past Medical History:  Diagnosis Date   Calculus of kidney    Diabetes mellitus without complication (HCC)    DVT (deep venous thrombosis) (HCC)    a. on  Eliquis   Family history of early CAD    a. parents passing in their 64's from CAD   Gout, unspecified    Heart murmur    Hyperlipidemia    Hypertension    Morbid obesity (HCC)    Normal cardiac stress test    a. equivocal study, sig soft tissue artifact present, no chest discomfort or ECG changes, perfusion images suggest mod sized region of mild reversible perfusion defect. Findings may be 2/2 shifting soft tissue attenuation, but cannot r/o ischemia, EF 72%   Phlebitis and thrombophlebitis of other deep vessels of lower extremities    Phlebitis and thrombophlebitis of other deep vessels of lower extremities    Pulmonary emboli (HCC)    a. on Eliquis   Spinal stenosis, unspecified region other than cervical    Tobacco abuse    Type II or unspecified type diabetes mellitus without mention of complication, uncontrolled    Unspecified sleep apnea    Urinary tract infection, site not specified    Past Surgical History:  Procedure Laterality Date   ABDOMINAL HYSTERECTOMY     hyperplasia of endometrium   ANKLE SURGERY     fracture s/p pin, right ankle   APPENDECTOMY     CHOLECYSTECTOMY     COLECTOMY     temporary colostomy, now reversed   INTESTINAL BYPASS     ovarian cyst ruptured, led to perforated intestine   LEFT HEART CATH AND CORONARY  ANGIOGRAPHY N/A 05/04/2023   Procedure: LEFT HEART CATH AND CORONARY ANGIOGRAPHY;  Surgeon: Yvonne Kendall, MD;  Location: [facility] INVASIVE CV LAB;  Service: Cardiovascular;  Laterality: N/A;   STOMACH SURGERY     Social History:  reports that she quit smoking about 23 years ago. She has a 10.00 pack-year smoking history. She has never used smokeless tobacco. She reports current alcohol use. She reports that she does not use drugs.  Allergies  Allergen Reactions   Sulfa Antibiotics Other (See Comments)    Reaction: isn't certain, thinks she ran a fever, or had a rash, maybe both.    Family History  Problem Relation Age of Onset   Heart  attack Mother 72   Cancer Mother        breast   Heart attack Father 46   Arthritis Sister     Prior to Admission medications   Medication Sig Start Date End Date Taking? Authorizing Provider  atorvastatin (LIPITOR) 40 MG tablet Take by mouth.    [provider]  azaTHIOprine (IMURAN) 50 MG tablet Take 100 mg by mouth 2 (two) times daily.    [provider]  Cholecalciferol (VITAMIN D) 1000 UNITS capsule Take 2,000 Units by mouth daily.    [provider]  ELIQUIS 5 MG TABS tablet TAKE 1 TABLET(5 MG) BY MOUTH TWICE DAILY 03/29/18   Antonieta Iba, MD  esomeprazole (NEXIUM) 40 MG capsule Take 40 mg by mouth daily before breakfast. Patient not taking: Reported on 04/29/2023    [provider]  famotidine (PEPCID) 20 MG tablet Take by mouth.    [provider]  FIASP 100 UNIT/ML SOLN Inject into the skin. 04/25/23   [provider]  fluticasone (FLONASE) 50 MCG/ACT nasal spray Place 2 sprays into both nostrils daily as needed for allergies or rhinitis. 02/21/19   McLean-Scocuzza, Pasty Spillers, MD  furosemide (LASIX) 40 MG tablet Take 1 tablet (40 mg total) by mouth daily. 01/09/18   Antonieta Iba, MD  insulin aspart (NOVOLOG) 100 UNIT/ML injection Inject 4 Units into the skin 3 (three) times daily with meals. 05/05/23   Lucile Shutters, MD  Insulin Glargine (BASAGLAR KWIKPEN) 100 UNIT/ML Inject 40 Units into the skin at bedtime. 11/15/22   [provider]  irbesartan (AVAPRO) 300 MG tablet Take 1 tablet (300 mg total) by mouth daily. 05/06/23 06/05/23  Agbata, Elwyn Lade, MD  JANUMET 50-500 MG tablet TK 1 T PO BID 09/20/16   [provider]  JARDIANCE 10 MG TABS tablet Take 10 mg by mouth daily.    [provider]  loratadine (CLARITIN) 10 MG tablet Take 1 tablet (10 mg total) by mouth daily as needed for allergies. 02/21/19   McLean-Scocuzza, Pasty Spillers, MD  metoprolol succinate (TOPROL-XL) 25 MG 24 hr tablet Take 1 tablet (25  mg total) by mouth daily. 05/06/23 06/05/23  Lucile Shutters, MD  Multiple Vitamin (MULTIVITAMIN) tablet Take 1 tablet by mouth daily.    [provider]  pregabalin (LYRICA) 150 MG capsule Take 150 mg by mouth in the morning, at noon, and at bedtime.    [provider]  Semaglutide, 2 MG/DOSE, (OZEMPIC, 2 MG/DOSE,) 8 MG/3ML SOPN Inject 0.25 mg once weekly for 4 week and increase to 0.5 mg weekly and follow up with PCP. 05/05/23   Lucile Shutters, MD  spironolactone (ALDACTONE) 25 MG tablet Take 25 mg by mouth daily.    [provider]  Dwyane Luo 100-62.5-25 MCG/ACT AEPB Inhale into the  lungs.    [provider]  Medications, CODE STATUS was removed from the records sent from [facility].  Medications have accordingly been continued as appropriate  Physical Exam: Vitals:   05/13/23 2247 05/13/23 2248 05/13/23 2250 05/13/23 2300  BP:  (!) 113/50  (!) 109/46  Pulse:  (!) 56  (!) 57  Resp:  (!) 22  13  Temp:  97.6 F (36.4 C)    TempSrc:  Oral    SpO2: 98% 99%  100%  Weight:   132.7 kg   Height:   5\' 2"  (1.575 m)    General: Patient is obviously obese, is somnolent on 2 L/min of supplemental oxygen patient is not engaging in meaningful conversation, not oriented to location time and not even able to give me date of birth Respiratory exam: Diminished air entry diffusely no focal finding Cardiovascular exam systolic murmur is present as Abdomen soft nontender Extremities warm without edema.  Patient is able to demonstrate effort in all 4 extremities.  Is definitely deconditioned. Data Reviewed:  Labs on Admission:  Results for orders placed or performed during the hospital encounter of 05/13/23 (from the past 24 hour(s))  Basic metabolic panel     Status: Abnormal   Collection Time: 05/13/23 10:59 PM  Result Value Ref Range   Sodium 128 (L) 135 - 145 mmol/L   Potassium 6.2 (H) 3.5 - 5.1 mmol/L   Chloride 92 (L) 98 - 111 mmol/L   CO2  21 (L) 22 - 32 mmol/L   Glucose, Bld 136 (H) 70 - 99 mg/dL   BUN 098 (H) 8 - 23 mg/dL   Creatinine, Ser 1.19 (H) 0.44 - 1.00 mg/dL   Calcium 7.0 (L) 8.9 - 10.3 mg/dL   GFR, Estimated 13 (L) >60 mL/min   Anion gap 15 5 - 15  CBC with Differential     Status: Abnormal   Collection Time: 05/13/23 10:59 PM  Result Value Ref Range   WBC 6.0 4.0 - 10.5 K/uL   RBC 3.22 (L) 3.87 - 5.11 MIL/uL   Hemoglobin 9.6 (L) 12.0 - 15.0 g/dL   HCT 14.7 (L) 82.9 - 56.2 %   MCV 92.2 80.0 - 100.0 fL   MCH 29.8 26.0 - 34.0 pg   MCHC 32.3 30.0 - 36.0 g/dL   RDW 13.0 (H) 86.5 - 78.4 %   Platelets 207 150 - 400 K/uL   nRBC 0.0 0.0 - 0.2 %   Neutrophils Relative % 80 %   Neutro Abs 4.9 1.7 - 7.7 K/uL   Lymphocytes Relative 9 %   Lymphs Abs 0.5 (L) 0.7 - 4.0 K/uL   Monocytes Relative 7 %   Monocytes Absolute 0.4 0.1 - 1.0 K/uL   Eosinophils Relative 3 %   Eosinophils Absolute 0.2 0.0 - 0.5 K/uL   Basophils Relative 1 %   Basophils Absolute 0.1 0.0 - 0.1 K/uL   Immature Granulocytes 0 %   Abs Immature Granulocytes 0.02 0.00 - 0.07 K/uL   Basic Metabolic Panel: Recent Labs  Lab 05/13/23 2259  NA 128*  K 6.2*  CL 92*  CO2 21*  GLUCOSE 136*  BUN 127*  CREATININE 3.64*  CALCIUM 7.0*   Liver Function Tests: No results for input(s): "AST", "ALT", "ALKPHOS", "BILITOT", "PROT", "ALBUMIN" in the last 168 hours. No results for input(s): "LIPASE", "AMYLASE" in the last 168 hours. No results for input(s): "AMMONIA" in the last 168 hours. CBC: Recent Labs  Lab 05/13/23 2259  WBC 6.0  NEUTROABS 4.9  HGB 9.6*  HCT 29.7*  MCV 92.2  PLT 207   Cardiac Enzymes: No results for input(s): "CKTOTAL", "CKMB", "CKMBINDEX", "TROPONINIHS" in the last 168 hours.  BNP (last 3 results) No results for input(s): "PROBNP" in the last 8760 hours. CBG: No results for input(s): "GLUCAP" in the last 168 hours.  Radiological Exams on Admission:  No results found.  EKG: Independently reviewed. Nsr. No peakd T  waves.   Assessment and Plan: * AKI (acute kidney injury) [facility]) Patient had l lab testing done on June 6. It is not entirely clear why patient's lab testing was repeated so quickly yesterday. Regardless this was found to be incidentally present associated with AKI. Further history is not available at this time such as if patient has been vomiting or having diarrhea etc. We will perform KUB imaging, treat with IV fluids and check urinalysis sodium and creatinine.  Associated with rather soft blood pressure.  Working diagnosis is that patient probably has AKI due to Lasix use.  Altered mental status Manifesting as lethargy.  Seems to have developed new since approximately 9 PM on June 14.  At the time EMS run report indicates patient was alert awake oriented x 4.  Given that the patient has previous history of acute respiratory failures, I will get an ABG.  Also patient is anticoagulated therefore will need a CT head.  Otherwise it is possible that patient is markedly somnolent because it is pretty late in the evening. Given marked elevateion in urea. Concern for Uremia as well. I discussed case with Dr. Suezanne Jacquet. She will eval in AM at [facility]  Anemia This is chronic, however seems to be worse today.  Discovered incidentally.  No evidence of active bleeding at this time.  I will send type and screen and anemia panel.  Trend  Hyponatremia Serum osmolality, check urine osmolality.  At this time working diagnosis is that the patient is hypovolemic, therefore has received some boluses in the ER.  And also is receiving sodium bicarbonate infusion to correct hyperkalemia.  I will check a trend serum sodium.  Hyperkalemia No peaked T waves.  Potassium of over 6.  Incidentally found.  Patient has received intravenous insulin, glucose, Lokelma in the ER.  I will trend this. Sodium bicarb infusion.  Hold losartan as well as spironolactone  OSA (obstructive sleep apnea) And is chronically on 3 L/min of  supplementary oxygen.  I will continue patient's CPAP here in the hospital  Pulmonary embolism [facility]) This is a chronic diagnosis.  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  And there is documentation of paroxysmal A-fib in the chart.  Continue with apixaban starting tomorrow  Essential hypertension Restart patient's metoprolol in the a.m. as indicated.  Patient's blood pressure is soft right now.  hOld losartan and spironolactone due to hyperkalemia      Advance Care Planning:   Code Status: Full Code per record from [facility].  Consults: renal as above.  Family Communication: too late to call family at this time.  Severity of Illness: The appropriate patient status for this patient is INPATIENT. Inpatient status is judged to be reasonable and necessary in order to provide the required intensity of service to ensure the patient's safety. The patient's presenting symptoms, physical exam findings, and initial radiographic and laboratory data in the context of their chronic comorbidities is felt to place them at high risk for further clinical deterioration. Furthermore, it  is not anticipated that the patient will be medically stable for discharge from the hospital within 2 midnights of admission.   * I certify that at the point of admission it is my clinical judgment that the patient will require inpatient hospital care spanning beyond 2 midnights from the point of admission due to high intensity of service, high risk for further deterioration and high frequency of surveillance required.*  Author: Nolberto Hanlon, MD 05/14/2023 12:57 AM  For on call review [email or web address].

## 2023-05-14 NOTE — Assessment & Plan Note (Signed)
And is chronically on 3 L/min of supplementary oxygen.  I will continue patient's CPAP here in the hospital

## 2023-05-14 NOTE — Assessment & Plan Note (Addendum)
No peaked T waves.  Potassium of over 6.  Incidentally found.  Patient has received intravenous insulin, glucose, Lokelma in the ER.  I will trend this. Sodium bicarb infusion.  Hold losartan as well as spironolactone

## 2023-05-14 NOTE — Assessment & Plan Note (Signed)
Serum osmolality, check urine osmolality.  At this time working diagnosis is that the patient is hypovolemic, therefore has received some boluses in the ER.  And also is receiving sodium bicarbonate infusion to correct hyperkalemia.  I will check a trend serum sodium.

## 2023-05-14 NOTE — ED Notes (Signed)
Patient transported to CT 

## 2023-05-14 NOTE — Assessment & Plan Note (Addendum)
Patient had l lab testing done on June 6. It is not entirely clear why patient's lab testing was repeated so quickly yesterday. Regardless this was found to be incidentally present associated with AKI. Further history is not available at this time such as if patient has been vomiting or having diarrhea etc. We will perform KUB imaging, treat with IV fluids and check urinalysis sodium and creatinine.  Associated with rather soft blood pressure.  Working diagnosis is that patient probably has AKI due to Lasix use.

## 2023-05-14 NOTE — ED Notes (Signed)
Urethral catheter placement attempted. Unsuccessful by multiple clinicians.   Purewick placed instead.

## 2023-05-14 NOTE — Progress Notes (Signed)
Courtesy (no billing note)-  Patient is seen and examined today morning. She is admitted from SNF with acute on chronic hypercapnic respiratory failure, AKI, hyperkalemia, AMS possibly metabolic in the setting of renal dysfunction, high co2, electrolyte abnormalities. Last admission reviewed with similar presentation.  Currently on Bipap placed at 3AM this morning, able to answer me appropriately in one word due to PAP machine. ABG reviewed. Discussed with nephrologist.  Avoid nephrotoxic drugs.Consulted pulmonology. Patient will need step down unit for close respiratory monitoring. Further management as per clinical course.

## 2023-05-14 NOTE — Assessment & Plan Note (Signed)
This is a chronic diagnosis.  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  And there is documentation of paroxysmal A-fib in the chart.  Continue with apixaban starting tomorrow

## 2023-05-14 NOTE — Assessment & Plan Note (Signed)
This is chronic, however seems to be worse today.  Discovered incidentally.  No evidence of active bleeding at this time.  I will send type and screen and anemia panel.  Trend

## 2023-05-14 NOTE — Progress Notes (Signed)
[facility] CONSULT NOTE    Date: 05/14/2023                  Patient Name:  Sonya Sanchez  MRN: [id number]  DOB: [birth year]  Age / Sex: 67 y.o., female         PCP: Pcp, No                 Service Requesting Consult: Medicine                  Reason for Consult: Acute kidney injury            History of Present Illness: Patient is a 67 y.o. female with a PMHx of morbid obesity, obstructive sleep apnea, bedbound status for 3 years, type 2 diabetes, hypertension, coronary artery disease, congestive heart failure, recent history of cardiac catheterization on 6//6/24.  At that time she had a creatinine of 1.22 and did not have any repeat blood work after that.  She is now sent to the emergency room with abnormal blood work.  In the ED she was found to be lethargic with a blood pressure 113/50 and elevated creatinine and potassium.  She was given insulin and glucose and also was placed on sodium bicarbonate drip.  The ED attempted to place a Foley catheter but was unsuccessful.  She is now on BiPAP and has been more lethargic.  The repeat potassium from this morning is 6.6.  She has been on losartan, metformin and spironolactone.  Medications: Outpatient medications: (Not in a hospital admission)   Discontinued Meds:   Medications Discontinued During This Encounter  Medication Reason   sodium bicarbonate 150 mEq in dextrose 5 % 1,150 mL infusion    FIASP 100 UNIT/ML SOLN Change in therapy   esomeprazole (NEXIUM) 40 MG capsule Patient has not taken in last 30 days   famotidine (PEPCID) 20 MG tablet Patient has not taken in last 30 days   JANUMET 50-500 MG tablet Change in therapy    Current medications: Current Facility-Administered Medications  Medication Dose Route Frequency Provider Last Rate Last Admin   0.9 %  sodium chloride infusion   Intravenous Continuous Lorain Childes, MD 75 mL/hr at 05/14/23 1049 New Bag at 05/14/23 1049   acetaminophen (TYLENOL)  tablet 650 mg  650 mg Oral Q6H PRN Nolberto Hanlon, MD       Or   acetaminophen (TYLENOL) suppository 650 mg  650 mg Rectal Q6H PRN Nolberto Hanlon, MD       apixaban Everlene Balls) tablet 5 mg  5 mg Oral BID Nolberto Hanlon, MD   5 mg at 05/14/23 0917   atorvastatin (LIPITOR) tablet 40 mg  40 mg Oral Daily Nolberto Hanlon, MD   40 mg at 05/14/23 0917   dextrose 50 % solution 50 mL  1 ampule Intravenous Once Makenzie Vittorio, MD       fluticasone furoate-vilanterol (BREO ELLIPTA) 100-25 MCG/ACT 1 puff  1 puff Inhalation Daily Nolberto Hanlon, MD   1 puff at 05/14/23 0921   insulin aspart (novoLOG) injection 0-15 Units  0-15 Units Subcutaneous TID WC Nolberto Hanlon, MD   2 Units at 05/14/23 0814   insulin aspart (novoLOG) injection 0-5 Units  0-5 Units Subcutaneous QHS Nolberto Hanlon, MD       insulin aspart (novoLOG) injection 10 Units  10 Units Intravenous Once Lorain Childes, MD       insulin glargine-yfgn (SEMGLEE) injection 40 Units  40 Units Subcutaneous QHS Goel,  Charmayne Sheer, MD       sodium chloride flush (NS) 0.9 % injection 3 mL  3 mL Intravenous Q12H Nolberto Hanlon, MD   3 mL at 05/14/23 0250   Current Outpatient Medications  Medication Sig Dispense Refill   acetaminophen (TYLENOL) 325 MG tablet Take 650 mg by mouth every 4 (four) hours as needed for mild pain, fever or headache.     atorvastatin (LIPITOR) 40 MG tablet Take 40 mg by mouth at bedtime.     azathioprine (IMURAN) 100 MG tablet Take 100 mg by mouth 2 (two) times daily.     Calcium Carb-Cholecalciferol (CALCIUM 600+D) 600-10 MG-MCG TABS Take 1 tablet by mouth daily.     Cholecalciferol (VITAMIN D) 1000 UNITS capsule Take 2,000 Units by mouth daily.     ciprofloxacin (CIPRO) 500 MG tablet Take 500 mg by mouth 2 (two) times daily.     ELIQUIS 5 MG TABS tablet TAKE 1 TABLET(5 MG) BY MOUTH TWICE DAILY 60 tablet 0   empagliflozin (JARDIANCE) 10 MG TABS tablet Take 10 mg by mouth daily.     fluticasone (FLONASE) 50 MCG/ACT nasal spray Place 2 sprays into both  nostrils daily as needed for allergies or rhinitis. 16 g 12   Fluticasone-Umeclidin-Vilant 100-62.5-25 MCG/ACT AEPB Inhale 1 puff into the lungs daily.     furosemide (LASIX) 40 MG tablet Take 1 tablet (40 mg total) by mouth daily. 30 tablet 11   Insulin Glargine (BASAGLAR KWIKPEN) 100 UNIT/ML Inject 40 Units into the skin at bedtime.     insulin lispro (HUMALOG) 100 UNIT/ML injection Inject 6 Units into the skin 3 (three) times daily before meals.     loratadine (CLARITIN) 10 MG tablet Take 1 tablet (10 mg total) by mouth daily as needed for allergies. 90 tablet 3   losartan (COZAAR) 100 MG tablet Take 100 mg by mouth daily.     metoprolol succinate (TOPROL-XL) 25 MG 24 hr tablet Take 1 tablet (25 mg total) by mouth daily. 30 tablet 0   Multiple Vitamin (MULTIVITAMIN) tablet Take 1 tablet by mouth daily.     omeprazole (PRILOSEC) 20 MG capsule Take 20 mg by mouth daily.     pregabalin (LYRICA) 200 MG capsule Take 200 mg by mouth 3 (three) times daily.     Semaglutide, 2 MG/DOSE, (OZEMPIC, 2 MG/DOSE,) 8 MG/3ML SOPN Inject 0.25 mg once weekly for 4 week and increase to 0.5 mg weekly and follow up with PCP. 3 mL 0   sodium chloride 1 g tablet Take 1 g by mouth daily.     sodium polystyrene (KAYEXALATE) 15 GM/60ML suspension Take 15 g by mouth daily.     spironolactone (ALDACTONE) 25 MG tablet Take 25 mg by mouth daily.     insulin aspart (NOVOLOG) 100 UNIT/ML injection Inject 4 Units into the skin 3 (three) times daily with meals. (Patient not taking: Reported on 05/14/2023) 10 mL 11   irbesartan (AVAPRO) 300 MG tablet Take 1 tablet (300 mg total) by mouth daily. (Patient not taking: Reported on 05/14/2023) 30 tablet 0      Allergies: Allergies  Allergen Reactions   Sulfa Antibiotics Other (See Comments)    Reaction: isn't certain, thinks she ran a fever, or had a rash, maybe both.      Past Medical History: Past Medical History:  Diagnosis Date   Calculus of kidney    Diabetes mellitus  without complication (HCC)    DVT (deep venous thrombosis) (HCC)    a.  on Eliquis   Family history of early CAD    a. parents passing in their 57's from CAD   Gout, unspecified    Heart murmur    Hyperlipidemia    Hypertension    Morbid obesity (HCC)    Normal cardiac stress test    a. equivocal study, sig soft tissue artifact present, no chest discomfort or ECG changes, perfusion images suggest mod sized region of mild reversible perfusion defect. Findings may be 2/2 shifting soft tissue attenuation, but cannot r/o ischemia, EF 72%   Phlebitis and thrombophlebitis of other deep vessels of lower extremities    Phlebitis and thrombophlebitis of other deep vessels of lower extremities    Pulmonary emboli (HCC)    a. on Eliquis   Spinal stenosis, unspecified region other than cervical    Tobacco abuse    Type II or unspecified type diabetes mellitus without mention of complication, uncontrolled    Unspecified sleep apnea    Urinary tract infection, site not specified      Past Surgical History: Past Surgical History:  Procedure Laterality Date   ABDOMINAL HYSTERECTOMY     hyperplasia of endometrium   ANKLE SURGERY     fracture s/p pin, right ankle   APPENDECTOMY     CHOLECYSTECTOMY     COLECTOMY     temporary colostomy, now reversed   INTESTINAL BYPASS     ovarian cyst ruptured, led to perforated intestine   LEFT HEART CATH AND CORONARY ANGIOGRAPHY N/A 05/04/2023   Procedure: LEFT HEART CATH AND CORONARY ANGIOGRAPHY;  Surgeon: Yvonne Kendall, MD;  Location: [facility] INVASIVE CV LAB;  Service: Cardiovascular;  Laterality: N/A;   STOMACH SURGERY       Family History: Family History  Problem Relation Age of Onset   Heart attack Mother 28   Cancer Mother        breast   Heart attack Father 77   Arthritis Sister      Social History: Social History   Socioeconomic History   Marital status: Single    Spouse name: Not on file   Number of children: Not on file   Years of  education: Not on file   Highest education level: Not on file  Occupational History   Not on file  Tobacco Use   Smoking status: Former    Packs/day: 1.00    Years: 10.00    Additional pack years: 0.00    Total pack years: 10.00    Types: Cigarettes    Quit date: 12/26/1999    Years since quitting: 23.3   Smokeless tobacco: Never  Substance and Sexual Activity   Alcohol use: Yes    Comment: socially   Drug use: No   Sexual activity: Not Currently  Other Topics Concern   Not on file  Social History Narrative   Lives in [place], [place] summer (near the [place]), [place] Dec-Mar.  Sister and children live here in [place]      Work - Retired Designer, television/film set      Diet - regular      Exercise - walks, limited by knee and ankle pain   Social Determinants of Corporate investment banker Strain: Not on file  Food Insecurity: No Food Insecurity (04/30/2023)   Hunger Vital Sign    Worried About Running Out of Food in the Last Year: Never true    Ran Out of Food in the Last Year: Never true  Transportation Needs: No Transportation Needs (04/30/2023)  PRAPARE - Administrator, Civil Service (Medical): No    Lack of Transportation (Non-Medical): No  Physical Activity: Not on file  Stress: Not on file  Social Connections: Not on file  Intimate Partner Violence: Not At Risk (04/30/2023)   Humiliation, Afraid, Rape, and Kick questionnaire    Fear of Current or Ex-Partner: No    Emotionally Abused: No    Physically Abused: No    Sexually Abused: No     Review of Systems: As per HPI  Vital Signs: Blood pressure (!) 90/57, pulse (!) 54, temperature 97.9 F (36.6 C), temperature source Axillary, resp. rate 20, height 5\' 2"  (1.575 m), weight 132.7 kg, SpO2 100 %.  Weight trends: Filed Weights   05/13/23 2250  Weight: 132.7 kg    Physical Exam: Physical Exam: General:  No acute distress  Head:  Normocephalic, atraumatic. Moist oral mucosal membranes  Eyes:  Anicteric   Neck:  Supple  Lungs:   Clear to auscultation, normal effort  Heart:  S1S2 no rubs  Abdomen:   Soft, nontender, bowel sounds present  Extremities:  2 plus peripheral edema.  Neurologic:  lethargic  Skin:  No lesions  Access:     Lab results:  Basic Metabolic Panel: Recent Labs  Lab 05/13/23 2259 05/14/23 0202 05/14/23 1030  NA 128* 128* 127*  K 6.2* 5.5* 6.2*  CL 92* 93* 90*  CO2 21* 21* 22  GLUCOSE 136* 128* 142*  BUN 127* 128* 142*  CREATININE 3.64* 3.66* 3.64*  CALCIUM 7.0* 6.8* 6.7*    Creatinine, Ser  Date/Time Value Ref Range Status  05/14/2023 10:30 AM 3.64 (H) 0.44 - 1.00 mg/dL Final  16/08/9603 54:09 AM 3.66 (H) 0.44 - 1.00 mg/dL Final  81/19/1478 29:56 PM 3.64 (H) 0.44 - 1.00 mg/dL Final  21/30/8657 84:69 AM 1.22 (H) 0.44 - 1.00 mg/dL Final  62/95/2841 32:44 AM 1.00 0.44 - 1.00 mg/dL Final  12/01/7251 66:44 PM 1.08 (H) 0.44 - 1.00 mg/dL Final  03/47/4259 56:38 AM 1.16 (H) 0.44 - 1.00 mg/dL Final  75/64/3329 51:88 AM 1.12 (H) 0.44 - 1.00 mg/dL Final  41/66/0630 16:01 AM 1.14 (H) 0.44 - 1.00 mg/dL Final  09/32/3557 32:20 PM 1.09 (H) 0.44 - 1.00 mg/dL Final  25/42/7062 37:62 PM 1.04 (H) 0.44 - 1.00 mg/dL Final  83/15/1761 60:73 AM 1.09 0.40 - 1.20 mg/dL Final  71/04/2693 85:46 AM 1.14 (H) 0.57 - 1.00 mg/dL Final  27/01/5008 38:18 AM 1.03 (H) 0.57 - 1.00 mg/dL Final  29/93/7169 67:89 AM 1.01 (H) 0.44 - 1.00 mg/dL Final  38/08/1750 02:58 AM 0.90 0.44 - 1.00 mg/dL Final  52/77/8242 35:36 AM 0.86 0.44 - 1.00 mg/dL Final  14/43/1540 08:67 AM 1.41 (H) 0.44 - 1.00 mg/dL Final  61/95/0932 67:12 PM 1.82 (H) 0.44 - 1.00 mg/dL Final  45/80/9983 38:25 AM 0.9 0.4 - 1.2 mg/dL Final  05/39/7673 41:93 AM 1.0 0.4 - 1.2 mg/dL Final  79/12/4095 35:32 PM 0.92 0.4 - 1.2 mg/dL Final    CBC: Recent Labs  Lab 05/13/23 2259 05/14/23 0202  WBC 6.0 6.7  NEUTROABS 4.9  --   HGB 9.6* 9.1*  HCT 29.7* 28.3*  MCV 92.2 93.1  PLT 207 205    Microbiology: Results for orders  placed or performed during the hospital encounter of 04/29/23  Blood culture (routine x 2)     Status: None   Collection Time: 04/29/23  3:07 PM   Specimen: BLOOD  Result Value Ref Range Status   Specimen  Description BLOOD LEFT ANTECUBITAL  Final   Special Requests   Final    BOTTLES DRAWN AEROBIC AND ANAEROBIC Blood Culture adequate volume   Culture   Final    NO GROWTH 5 DAYS Performed at [facility], [address] [place], [place] [address]    Report Status 05/04/2023 FINAL  Final  Blood culture (routine x 2)     Status: None   Collection Time: 04/29/23  3:28 PM   Specimen: BLOOD  Result Value Ref Range Status   Specimen Description BLOOD BLOOD LEFT HAND  Final   Special Requests   Final    BOTTLES DRAWN AEROBIC AND ANAEROBIC Blood Culture adequate volume   Culture   Final    NO GROWTH 5 DAYS Performed at [facility], [address] [place], [place] [address]    Report Status 05/04/2023 FINAL  Final  SARS Coronavirus 2 by RT PCR (hospital order, performed in [facility] hospital lab) *cepheid single result test* Anterior Nasal Swab     Status: None   Collection Time: 04/29/23  3:44 PM   Specimen: Anterior Nasal Swab  Result Value Ref Range Status   SARS Coronavirus 2 by RT PCR NEGATIVE NEGATIVE Final    Comment: (NOTE) SARS-CoV-2 target nucleic acids are NOT DETECTED.  The SARS-CoV-2 RNA is generally detectable in upper and lower respiratory specimens during the acute phase of infection. The lowest concentration of SARS-CoV-2 viral copies this assay can detect is 250 copies / mL. A negative result does not preclude SARS-CoV-2 infection and should not be used as the sole basis for treatment or other patient management decisions.  A negative result may occur with improper specimen collection / handling, submission of specimen other than nasopharyngeal swab, presence of viral mutation(s) within the areas targeted by this assay, and inadequate number  of viral copies (<250 copies / mL). A negative result must be combined with clinical observations, patient history, and epidemiological information.  Fact Sheet for Patients:   RoadLapTop.co.za  Fact Sheet for Healthcare Providers: http://kim-miller.com/  This test is not yet approved or  cleared by the [place] FDA and has been authorized for detection and/or diagnosis of SARS-CoV-2 by FDA under an Emergency Use Authorization (EUA).  This EUA will remain in effect (meaning this test can be used) for the duration of the COVID-19 declaration under Section 564(b)(1) of the Act, 21 U.S.C. section 360bbb-3(b)(1), unless the authorization is terminated or revoked sooner.  Performed at [facility], [address] [place], [place] [address]     Urinalysis: No results for input(s): "COLORURINE", "LABSPEC", "PHURINE", "GLUCOSEU", "HGBUR", "BILIRUBINUR", "KETONESUR", "PROTEINUR", "UROBILINOGEN", "NITRITE", "LEUKOCYTESUR" in the last 72 hours.  Invalid input(s): "APPERANCEUR"   Imaging:  DG Chest Port 1 View  Result Date: 05/14/2023 CLINICAL DATA:  Preop EXAM: PORTABLE CHEST 1 VIEW COMPARISON:  02/27/2023 FINDINGS: Cardiomegaly, vascular congestion. Mediastinal contours within normal limits. Linear subsegmental atelectasis in the right mid lung. No overt edema or confluent opacities. No effusions or acute bony abnormality. IMPRESSION: Cardiomegaly, vascular congestion. Electronically Signed   By: Charlett Nose M.D.   On: 05/14/2023 01:34   CT ABDOMEN PELVIS WO CONTRAST  Result Date: 05/14/2023 CLINICAL DATA:  Kidney failure, acute EXAM: CT ABDOMEN AND PELVIS WITHOUT CONTRAST TECHNIQUE: Multidetector CT imaging of the abdomen and pelvis was performed following the standard protocol without IV contrast. RADIATION DOSE REDUCTION: This exam was performed according to the departmental dose-optimization program which includes automated  exposure control, adjustment of  the mA and/or kV according to patient size and/or use of iterative reconstruction technique. COMPARISON:  None Available. FINDINGS: Lower chest: Small bilateral pleural effusions. Bilateral lower lobe airspace disease, left greater than right. Mild cardiomegaly. Diffuse coronary artery calcifications. Hepatobiliary: Layering high-density material within the gallbladder could reflect small stones/gravel. No focal hepatic abnormality or biliary ductal dilatation. Pancreas: No focal abnormality or ductal dilatation. Spleen: No focal abnormality.  Normal size. Adrenals/Urinary Tract: No adrenal abnormality. No suspicious focal renal abnormality. Small exophytic cyst off the lower pole of the left kidney measuring 3 cm. No follow-up imaging recommended. No stones or hydronephrosis. Urinary bladder is unremarkable. Stomach/Bowel: Few scattered colonic diverticula. No active diverticulitis. Stomach, large and small bowel grossly unremarkable. Vascular/Lymphatic: Aortic atherosclerosis. No evidence of aneurysm or adenopathy. Reproductive: Prior hysterectomy.  No adnexal masses. Other: No free fluid or free air. Musculoskeletal: No acute bony abnormality. IMPRESSION: Small bilateral pleural effusions. Bibasilar atelectasis or infiltrates, left greater than right. Coronary artery disease, aortic atherosclerosis. Layering high-density material in the gallbladder could reflect small stones/gravel. No hydronephrosis. Scattered colonic diverticula. Electronically Signed   By: Charlett Nose M.D.   On: 05/14/2023 01:34   CT HEAD WO CONTRAST ( )  Result Date: 05/14/2023 CLINICAL DATA:  Elevated potassium, altered mental status EXAM: CT HEAD WITHOUT CONTRAST TECHNIQUE: Contiguous axial images were obtained from the base of the skull through the vertex without intravenous contrast. RADIATION DOSE REDUCTION: This exam was performed according to the departmental dose-optimization program which  includes automated exposure control, adjustment of the mA and/or kV according to patient size and/or use of iterative reconstruction technique. COMPARISON:  04/29/2023 FINDINGS: Brain: No evidence of acute infarction, hemorrhage, mass, mass effect, or midline shift. No hydrocephalus or extra-axial fluid collection. Periventricular white matter changes, likely the sequela of chronic small vessel ischemic disease. Vascular: No hyperdense vessel. Skull: Negative for fracture or focal lesion. Sinuses/Orbits: Air-fluid level in the right sphenoid sinus. Mild mucosal thickening in the ethmoid air cells and right maxillary sinus. Status post bilateral lens replacements. Other: The mastoid air cells are well aerated. IMPRESSION: No acute intracranial process. Electronically Signed   By: Wiliam Ke M.D.   On: 05/14/2023 01:27     Assessment & Plan:  67 year old female with a PMHx of morbid obesity, obstructive sleep apnea, bedbound status for 3 years, type 2 diabetes, hypertension, coronary artery disease, congestive heart failure, recent history of cardiac catheterization on 6//6/24.  At that time she had a creatinine of 1.22 and did not have any repeat blood work after that.  She is now sent to the emergency room with abnormal blood work.  In the ED she was found to be lethargic with a blood pressure 113/50 and elevated creatinine and potassium.  She was given insulin and glucose and also was placed on sodium bicarbonate drip.  The ED attempted to place a Foley catheter but was unsuccessful.  She is now on BiPAP and has been more lethargic.  The repeat potassium from this morning is 6.6.  She has been on losartan, metformin and spironolactone.   Principal Problem:   AKI (acute kidney injury) (HCC) Active Problems:   Essential hypertension   Pulmonary embolism (HCC)   OSA (obstructive sleep apnea)   Altered mental status   Hyperkalemia   Hyponatremia   Anemia  #1: Acute kidney injury: Patient probably  has acute kidney injury for the last 1 week after the IV contrast.  The hyperkalemia can be secondary to losartan complicated by spironolactone.  At the present time they both are on hold.  Will need to have a Foley catheter placed to monitor urine output accurately.  Will continue the gentle hydration with isotonic saline.  Will monitor closely and assess for the need for dialysis.  #2: Hyperkalemia: Patient received doses of insulin along with glucose and sodium bicarbonate.  She was also given Lokelma.  Will give another dose of insulin along with dextrose.  Will attempt to place a Foley catheter.  Presently losartan and spironolactone are discontinued.  #3: Hyponatremia: This is most likely secondary to dilutional causes with volume overloaded.  Once blood pressure improves will need to aggressively diurese.  #4: Respiratory acidosis: This may be secondary to CO2 narcosis.  Patient is presently on BiPAP.  Spoke to pulmonary who will be evaluating the patient today.  #5: Coronary artery disease/congestive heart failure/hypertension: Cardiology evaluation.  #6: Anemia: Most likely secondary to iron deficiency/acute kidney injury.  Overall prognosis is poor.  Will continue to monitor closely and will assess for a need for initiation of dialysis.   LOS: 0 Lorain Childes, MD [facility]. 6/15/202412:16 PM

## 2023-05-14 NOTE — ED Notes (Signed)
Lab at bedside to collect blood sample. 

## 2023-05-14 NOTE — Assessment & Plan Note (Addendum)
Manifesting as lethargy.  Seems to have developed new since approximately 9 PM on June 14.  At the time EMS run report indicates patient was alert awake oriented x 4.  Given that the patient has previous history of acute respiratory failures, I will get an ABG.  Also patient is anticoagulated therefore will need a CT head.  Otherwise it is possible that patient is markedly somnolent because it is pretty late in the evening. Given marked elevateion in urea. Concern for Uremia as well. I discussed case with Dr. Suezanne Jacquet. She will eval in AM at [facility]

## 2023-05-14 NOTE — Consult Note (Signed)
CRITICAL CARE PROGRESS NOTE    Name: Mata Kilgour MRN: [id number] DOB: 09-Aug-1956     LOS: 0   SUBJECTIVE FINDINGS & SIGNIFICANT EVENTS    History of Presenting Illness:  67 yo F with obesity, from nursing home. She has as history of PE, DVT, aortic stenosis, HTN, chronic bronchitis, OSA, AKI, dermatomyositis, DM2, dyslipidemia, came in due to worsening renal function noted on bloodwork.  She was noted to be lethargic, denied pain anywhere. She was found to have severe hypercapnic acidemia.  She was placed on BIPAP.  PCCM was consulted to due AMS lethargy on BIPAP.    Lines/tubes : External Urinary Catheter (Active)  Collection Container Dedicated Suction Canister 05/14/23 0236  Suction (Verified suction is between 40-80 mmHg) Yes 05/14/23 0236  Securement Method Securing device (Describe) 05/14/23 0236  Site Assessment Clean, Dry, Intact 05/14/23 0236    Microbiology/Sepsis markers: Results for orders placed or performed during the hospital encounter of 04/29/23  Blood culture (routine x 2)     Status: None   Collection Time: 04/29/23  3:07 PM   Specimen: BLOOD  Result Value Ref Range Status   Specimen Description BLOOD LEFT ANTECUBITAL  Final   Special Requests   Final    BOTTLES DRAWN AEROBIC AND ANAEROBIC Blood Culture adequate volume   Culture   Final    NO GROWTH 5 DAYS Performed at [facility], [address] [place], [place] [address]    Report Status 05/04/2023 FINAL  Final  Blood culture (routine x 2)     Status: None   Collection Time: 04/29/23  3:28 PM   Specimen: BLOOD  Result Value Ref Range Status   Specimen Description BLOOD BLOOD LEFT HAND  Final   Special Requests   Final    BOTTLES DRAWN AEROBIC AND ANAEROBIC Blood Culture adequate volume   Culture   Final    NO GROWTH  5 DAYS Performed at [facility], [address] [place], [place] [address]    Report Status 05/04/2023 FINAL  Final  SARS Coronavirus 2 by RT PCR (hospital order, performed in [facility] hospital lab) *cepheid single result test* Anterior Nasal Swab     Status: None   Collection Time: 04/29/23  3:44 PM   Specimen: Anterior Nasal Swab  Result Value Ref Range Status   SARS Coronavirus 2 by RT PCR NEGATIVE NEGATIVE Final    Comment: (NOTE) SARS-CoV-2 target nucleic acids are NOT DETECTED.  The SARS-CoV-2 RNA is generally detectable in upper and lower respiratory specimens during the acute phase of infection. The lowest concentration of SARS-CoV-2 viral copies this assay can detect is 250 copies / mL. A negative result does not preclude SARS-CoV-2 infection and should not be used as the sole basis for treatment or other patient management decisions.  A negative result may occur with improper specimen collection / handling, submission of specimen other than nasopharyngeal swab, presence of viral mutation(s) within the areas targeted by this assay, and inadequate number of viral copies (<250 copies / mL). A negative result must be combined with clinical observations, patient history, and epidemiological information.  Fact Sheet for Patients:   RoadLapTop.co.za  Fact Sheet for Healthcare Providers: http://kim-miller.com/  This test is not yet approved or  cleared by the [place] FDA and has been authorized for detection and/or diagnosis of SARS-CoV-2 by FDA under an Emergency Use Authorization (EUA).  This EUA will remain in effect (meaning this test can be used) for the duration  of the COVID-19 declaration under Section 564(b)(1) of the Act, 21 U.S.C. section 360bbb-3(b)(1), unless the authorization is terminated or revoked sooner.  Performed at [facility], [address], [place], [place] [address]      Anti-infectives:  Anti-infectives (From admission, onward)    None         PAST MEDICAL HISTORY   Past Medical History:  Diagnosis Date   Calculus of kidney    Diabetes mellitus without complication (HCC)    DVT (deep venous thrombosis) (HCC)    a. on Eliquis   Family history of early CAD    a. parents passing in their 37's from CAD   Gout, unspecified    Heart murmur    Hyperlipidemia    Hypertension    Morbid obesity (HCC)    Normal cardiac stress test    a. equivocal study, sig soft tissue artifact present, no chest discomfort or ECG changes, perfusion images suggest mod sized region of mild reversible perfusion defect. Findings may be 2/2 shifting soft tissue attenuation, but cannot r/o ischemia, EF 72%   Phlebitis and thrombophlebitis of other deep vessels of lower extremities    Phlebitis and thrombophlebitis of other deep vessels of lower extremities    Pulmonary emboli (HCC)    a. on Eliquis   Spinal stenosis, unspecified region other than cervical    Tobacco abuse    Type II or unspecified type diabetes mellitus without mention of complication, uncontrolled    Unspecified sleep apnea    Urinary tract infection, site not specified      SURGICAL HISTORY   Past Surgical History:  Procedure Laterality Date   ABDOMINAL HYSTERECTOMY     hyperplasia of endometrium   ANKLE SURGERY     fracture s/p pin, right ankle   APPENDECTOMY     CHOLECYSTECTOMY     COLECTOMY     temporary colostomy, now reversed   INTESTINAL BYPASS     ovarian cyst ruptured, led to perforated intestine   LEFT HEART CATH AND CORONARY ANGIOGRAPHY N/A 05/04/2023   Procedure: LEFT HEART CATH AND CORONARY ANGIOGRAPHY;  Surgeon: Yvonne Kendall, MD;  Location: [facility] INVASIVE CV LAB;  Service: Cardiovascular;  Laterality: N/A;   STOMACH SURGERY       FAMILY HISTORY   Family History  Problem Relation Age of Onset   Heart attack Mother 49   Cancer Mother        breast   Heart attack  Father 17   Arthritis Sister      SOCIAL HISTORY   Social History   Tobacco Use   Smoking status: Former    Packs/day: 1.00    Years: 10.00    Additional pack years: 0.00    Total pack years: 10.00    Types: Cigarettes    Quit date: 12/26/1999    Years since quitting: 23.3   Smokeless tobacco: Never  Substance Use Topics   Alcohol use: Yes    Comment: socially   Drug use: No     MEDICATIONS   Current Medication:  Current Facility-Administered Medications:    0.9 %  sodium chloride infusion, , Intravenous, Continuous, Korrapati, Madhu, MD, Last Rate: 75 mL/hr at 05/14/23 1049, New Bag at 05/14/23 1049   acetaminophen (TYLENOL) tablet 650 mg, 650 mg, Oral, Q6H PRN **OR** acetaminophen (TYLENOL) suppository 650 mg, 650 mg, Rectal, Q6H PRN, Nolberto Hanlon, MD   apixaban (ELIQUIS) tablet 5 mg, 5 mg, Oral, BID, Nolberto Hanlon, MD, 5 mg at 05/14/23  1610   atorvastatin (LIPITOR) tablet 40 mg, 40 mg, Oral, Daily, Nolberto Hanlon, MD, 40 mg at 05/14/23 0917   fluticasone furoate-vilanterol (BREO ELLIPTA) 100-25 MCG/ACT 1 puff, 1 puff, Inhalation, Daily, Nolberto Hanlon, MD, 1 puff at 05/14/23 0921   insulin aspart (novoLOG) injection 0-15 Units, 0-15 Units, Subcutaneous, TID WC, Nolberto Hanlon, MD, 2 Units at 05/14/23 0814   insulin aspart (novoLOG) injection 0-5 Units, 0-5 Units, Subcutaneous, QHS, Nolberto Hanlon, MD   insulin glargine-yfgn (SEMGLEE) injection 40 Units, 40 Units, Subcutaneous, QHS, Nolberto Hanlon, MD   sodium chloride flush (NS) 0.9 % injection 3 mL, 3 mL, Intravenous, Q12H, Nolberto Hanlon, MD, 3 mL at 05/14/23 0250  Current Outpatient Medications:    acetaminophen (TYLENOL) 325 MG tablet, Take 650 mg by mouth every 4 (four) hours as needed for mild pain, fever or headache., Disp: , Rfl:    atorvastatin (LIPITOR) 40 MG tablet, Take 40 mg by mouth at bedtime., Disp: , Rfl:    azathioprine (IMURAN) 100 MG tablet, Take 100 mg by mouth 2 (two) times daily., Disp: , Rfl:    Calcium  Carb-Cholecalciferol (CALCIUM 600+D) 600-10 MG-MCG TABS, Take 1 tablet by mouth daily., Disp: , Rfl:    Cholecalciferol (VITAMIN D) 1000 UNITS capsule, Take 2,000 Units by mouth daily., Disp: , Rfl:    ciprofloxacin (CIPRO) 500 MG tablet, Take 500 mg by mouth 2 (two) times daily., Disp: , Rfl:    ELIQUIS 5 MG TABS tablet, TAKE 1 TABLET(5 MG) BY MOUTH TWICE DAILY, Disp: 60 tablet, Rfl: 0   empagliflozin (JARDIANCE) 10 MG TABS tablet, Take 10 mg by mouth daily., Disp: , Rfl:    fluticasone (FLONASE) 50 MCG/ACT nasal spray, Place 2 sprays into both nostrils daily as needed for allergies or rhinitis., Disp: 16 g, Rfl: 12   Fluticasone-Umeclidin-Vilant 100-62.5-25 MCG/ACT AEPB, Inhale 1 puff into the lungs daily., Disp: , Rfl:    furosemide (LASIX) 40 MG tablet, Take 1 tablet (40 mg total) by mouth daily., Disp: 30 tablet, Rfl: 11   Insulin Glargine (BASAGLAR KWIKPEN) 100 UNIT/ML, Inject 40 Units into the skin at bedtime., Disp: , Rfl:    insulin lispro (HUMALOG) 100 UNIT/ML injection, Inject 6 Units into the skin 3 (three) times daily before meals., Disp: , Rfl:    loratadine (CLARITIN) 10 MG tablet, Take 1 tablet (10 mg total) by mouth daily as needed for allergies., Disp: 90 tablet, Rfl: 3   losartan (COZAAR) 100 MG tablet, Take 100 mg by mouth daily., Disp: , Rfl:    metoprolol succinate (TOPROL-XL) 25 MG 24 hr tablet, Take 1 tablet (25 mg total) by mouth daily., Disp: 30 tablet, Rfl: 0   Multiple Vitamin (MULTIVITAMIN) tablet, Take 1 tablet by mouth daily., Disp: , Rfl:    omeprazole (PRILOSEC) 20 MG capsule, Take 20 mg by mouth daily., Disp: , Rfl:    pregabalin (LYRICA) 200 MG capsule, Take 200 mg by mouth 3 (three) times daily., Disp: , Rfl:    Semaglutide, 2 MG/DOSE, (OZEMPIC, 2 MG/DOSE,) 8 MG/3ML SOPN, Inject 0.25 mg once weekly for 4 week and increase to 0.5 mg weekly and follow up with PCP., Disp: 3 mL, Rfl: 0   sodium chloride 1 g tablet, Take 1 g by mouth daily., Disp: , Rfl:    sodium  polystyrene (KAYEXALATE) 15 GM/60ML suspension, Take 15 g by mouth daily., Disp: , Rfl:    spironolactone (ALDACTONE) 25 MG tablet, Take 25 mg by mouth daily., Disp: , Rfl:  insulin aspart (NOVOLOG) 100 UNIT/ML injection, Inject 4 Units into the skin 3 (three) times daily with meals. (Patient not taking: Reported on 05/14/2023), Disp: 10 mL, Rfl: 11   irbesartan (AVAPRO) 300 MG tablet, Take 1 tablet (300 mg total) by mouth daily. (Patient not taking: Reported on 05/14/2023), Disp: 30 tablet, Rfl: 0    ALLERGIES   Sulfa antibiotics    REVIEW OF SYSTEMS     10 point ROS unable to obtain due to encephalopathy  PHYSICAL EXAMINATION   Vital Signs: Temp:  [97.5 F (36.4 C)-97.9 F (36.6 C)] 97.5 F (36.4 C) (06/15 1313) Pulse Rate:  [54-67] 54 (06/15 1200) Resp:  [11-23] 20 (06/15 1200) BP: (80-154)/(24-99) 90/57 (06/15 1200) SpO2:  [98 %-100 %] 100 % (06/15 1200) FiO2 (%):  [36 %] 36 % (06/15 0747) Weight:  [132.7 kg] 132.7 kg (06/14 2250)  GENERAL:encephalopathic, NCAT age appropriate HEAD: Normocephalic, atraumatic.  EYES: Pupils equal, round, reactive to light.  No scleral icterus.  MOUTH: Moist mucosal membrane. NECK: Supple. No thyromegaly. No nodules. No JVD.  PULMONARY: decreased air entry CARDIOVASCULAR: S1 and S2. Regular rate and rhythm. No murmurs, rubs, or gallops.  GASTROINTESTINAL: Soft, nontender, non-distended. No masses. Positive bowel sounds. No hepatosplenomegaly.  MUSCULOSKELETAL: No swelling, clubbing, or edema.  NEUROLOGIC: Mild distress due to acute illness SKIN:intact,warm,dry   PERTINENT DATA     Infusions:  sodium chloride 75 mL/hr at 05/14/23 1049   Scheduled Medications:  apixaban  5 mg Oral BID   atorvastatin  40 mg Oral Daily   fluticasone furoate-vilanterol  1 puff Inhalation Daily   insulin aspart  0-15 Units Subcutaneous TID WC   insulin aspart  0-5 Units Subcutaneous QHS   insulin glargine-yfgn  40 Units Subcutaneous QHS    sodium chloride flush  3 mL Intravenous Q12H   PRN Medications: acetaminophen **OR** acetaminophen Hemodynamic parameters:   Intake/Output: No intake/output data recorded.  Ventilator  Settings: FiO2 (%):  [36 %] 36 % Pressure Support:  [16 cmH20] 16 cmH20   LAB RESULTS:  Basic Metabolic Panel: Recent Labs  Lab 05/13/23 2259 05/14/23 0202 05/14/23 1030  NA 128* 128* 127*  K 6.2* 5.5* 6.2*  CL 92* 93* 90*  CO2 21* 21* 22  GLUCOSE 136* 128* 142*  BUN 127* 128* 142*  CREATININE 3.64* 3.66* 3.64*  CALCIUM 7.0* 6.8* 6.7*   Liver Function Tests: Recent Labs  Lab 05/14/23 0202 05/14/23 1030  AST 13* 20  ALT 9 9  ALKPHOS 42 41  BILITOT 0.7 1.1  PROT 6.0* 5.8*  ALBUMIN 3.1* 2.9*   No results for input(s): "LIPASE", "AMYLASE" in the last 168 hours. No results for input(s): "AMMONIA" in the last 168 hours. CBC: Recent Labs  Lab 05/13/23 2259 05/14/23 0202  WBC 6.0 6.7  NEUTROABS 4.9  --   HGB 9.6* 9.1*  HCT 29.7* 28.3*  MCV 92.2 93.1  PLT 207 205   Cardiac Enzymes: No results for input(s): "CKTOTAL", "CKMB", "CKMBINDEX", "TROPONINI" in the last 168 hours. BNP: Invalid input(s): "POCBNP" CBG: Recent Labs  Lab 05/14/23 0159 05/14/23 0804 05/14/23 1205  GLUCAP 131* 126* 113*       IMAGING RESULTS:     ASSESSMENT AND PLAN    -Multidisciplinary rounds held today  Acute Hypoxic Hypercapnic  Respiratory Failure -failure of BIPAP will prepare for endotracheal intubation , patient lethargic severely acidemic with hypercapnia -ventilator management - 8cc/kg for first 24h -diuresis per cardiology  -repeat ABG   Acute on chronic diastolic heart  failure    Continue diuresis   - cardio on case appreciate input -oxygen as needed -Lasix as tolerated -cardiac telemetry monitoring  Renal Failure-most likely due to ischemia secondary to hypotension  -follow chem 7 -follow UO -continue Foley Catheter-assess need daily -nephrology on case appreciate  input  Altered mental status with encephalopathy   - suspect CO2 induced hypercapnic encephalopathy - intubated and sedated - minimal sedation to achieve a RASS goal: -1 Wake up assessment pending   Dermatomyositis   Chronic stable condition - CK within reference range.  ESR/CRP trend   Circulatory shock    - present on admission - due to cardiac failure vs septic shock     - continue IV abx, remains on vasopressors   ID -continue IV abx as prescibed -follow up cultures  GI/Nutrition GI PROPHYLAXIS as indicated DIET-->TF's as tolerated Constipation protocol as indicated  ENDO - ICU hypoglycemic\Hyperglycemia protocol -check FSBS per protocol   ELECTROLYTES -follow labs as needed -replace as needed -pharmacy consultation   DVT/GI PRX ordered -SCDs  TRANSFUSIONS AS NEEDED MONITOR FSBS ASSESS the need for LABS as needed    Critical care provider statement:   Total critical care time: 33 minutes   Performed by: Karna Christmas MD   Critical care time was exclusive of separately billable procedures and treating other patients.   Critical care was necessary to treat or prevent imminent or life-threatening deterioration.   Critical care was time spent personally by me on the following activities: development of treatment plan with patient and/or surrogate as well as nursing, discussions with consultants, evaluation of patient's response to treatment, examination of patient, obtaining history from patient or surrogate, ordering and performing treatments and interventions, ordering and review of laboratory studies, ordering and review of radiographic studies, pulse oximetry and re-evaluation of patient's condition.    Vida Rigger, M.D.  Pulmonary & Critical Care Medicine

## 2023-05-14 NOTE — Progress Notes (Signed)
       CROSS COVER NOTE  NAME: Sonya Sanchez MRN: [id number] DOB : 07/09/1956    Concern as stated by nurse / staff   ABG ordered on admit reported to me 7.14, 62, 110, 21.1      Pertinent findings on chart review:   Assessment and  Interventions   Assessment:  Plan: BIPAP Repeat ABG in one hour       Donnie Mesa NP [facility] Cross Cover 7pm-7am - check amion for availability Pager [phone]

## 2023-05-15 DIAGNOSIS — N179 Acute kidney failure, unspecified: Secondary | ICD-10-CM | POA: Diagnosis not present

## 2023-05-15 DIAGNOSIS — E875 Hyperkalemia: Secondary | ICD-10-CM | POA: Diagnosis not present

## 2023-05-15 DIAGNOSIS — I5033 Acute on chronic diastolic (congestive) heart failure: Secondary | ICD-10-CM | POA: Diagnosis not present

## 2023-05-15 LAB — RESPIRATORY PANEL BY PCR

## 2023-05-15 LAB — CBC
HCT: 27.1 % — ABNORMAL LOW (ref 36.0–46.0)
Hemoglobin: 8.9 g/dL — ABNORMAL LOW (ref 12.0–15.0)
MCH: 29 pg (ref 26.0–34.0)
MCHC: 32.8 g/dL (ref 30.0–36.0)
MCV: 88.3 fL (ref 80.0–100.0)
Platelets: 190 10*3/uL (ref 150–400)
RBC: 3.07 MIL/uL — ABNORMAL LOW (ref 3.87–5.11)
RDW: 18.8 % — ABNORMAL HIGH (ref 11.5–15.5)
WBC: 7.9 10*3/uL (ref 4.0–10.5)
nRBC: 0 % (ref 0.0–0.2)

## 2023-05-15 LAB — BASIC METABOLIC PANEL
Anion gap: 14 (ref 5–15)
BUN: 123 mg/dL — ABNORMAL HIGH (ref 8–23)
CO2: 22 mmol/L (ref 22–32)
Calcium: 7.1 mg/dL — ABNORMAL LOW (ref 8.9–10.3)
Chloride: 95 mmol/L — ABNORMAL LOW (ref 98–111)
Creatinine, Ser: 2.75 mg/dL — ABNORMAL HIGH (ref 0.44–1.00)
GFR, Estimated: 18 mL/min — ABNORMAL LOW (ref >60)
Glucose, Bld: 115 mg/dL — ABNORMAL HIGH (ref 70–99)
Potassium: 4.8 mmol/L (ref 3.5–5.1)
Sodium: 131 mmol/L — ABNORMAL LOW (ref 135–145)

## 2023-05-15 LAB — GLUCOSE, CAPILLARY
Glucose-Capillary: 120 mg/dL — ABNORMAL HIGH (ref 70–99)
Glucose-Capillary: 42 mg/dL — CL (ref 70–99)
Glucose-Capillary: 102 mg/dL — ABNORMAL HIGH (ref 70–99)
Glucose-Capillary: 92 mg/dL (ref 70–99)
Glucose-Capillary: 99 mg/dL (ref 70–99)
Glucose-Capillary: 111 mg/dL — ABNORMAL HIGH (ref 70–99)

## 2023-05-15 LAB — MAGNESIUM: Magnesium: 1.7 mg/dL (ref 1.7–2.4)

## 2023-05-15 LAB — TSH: TSH: 5.29 u[IU]/mL — ABNORMAL HIGH (ref 0.350–4.500)

## 2023-05-15 LAB — C-REACTIVE PROTEIN: CRP: 1.7 mg/dL — ABNORMAL HIGH (ref <1.0)

## 2023-05-15 LAB — TRIGLYCERIDES: Triglycerides: 126 mg/dL (ref <150)

## 2023-05-15 MED ORDER — MAGNESIUM SULFATE 2 GM/50ML IV SOLN
2.0000 g | Freq: Once | INTRAVENOUS | Status: AC
  Administered 2023-05-15: 2 g via INTRAVENOUS
  Filled 2023-05-15: qty 50

## 2023-05-15 NOTE — Progress Notes (Signed)
[facility]  PROGRESS NOTE   Subjective:   Patient is now intubated and sedated.  On Levophed at this time for blood pressure support. Started on Lasix drip yesterday with a urine output of 3.8 L.  Hemodynamically more stable.  Objective:  Vital signs: Blood pressure (!) 96/36, pulse 61, temperature 99.5 F (37.5 C), resp. rate 16, height 5\' 2"  (1.575 m), weight 128.3 kg, SpO2 92 %.  Intake/Output Summary (Last 24 hours) at 05/15/2023 1044 Last data filed at 05/15/2023 0600 Gross per 24 hour  Intake 1409.78 ml  Output 3825 ml  Net -2415.22 ml   Filed Weights   05/14/23 0703 05/14/23 1624 05/15/23 0500  Weight: 128.6 kg 128.6 kg 128.3 kg     Physical Exam: General:  No acute distress  Head:  Normocephalic, atraumatic. Moist oral mucosal membranes  Eyes:  Anicteric  Neck:  Supple  Lungs:   Clear to auscultation, normal effort  Heart:  S1S2 no rubs  Abdomen:   Soft, nontender, bowel sounds present  Extremities: 2+ peripheral edema.  Neurologic: Vented and sedated  Skin:  No lesions  Access:     Basic Metabolic Panel: Recent Labs  Lab 05/14/23 0202 05/14/23 1030 05/14/23 1516 05/14/23 2155 05/15/23 0550 05/15/23 0551  NA 128* 127* 130* 129*  --  131*  K 5.5* 6.2* 5.3* 5.4*  --  4.8  CL 93* 90* 92* 95*  --  95*  CO2 21* 22 21* 20*  --  22  GLUCOSE 128* 142* 122* 137*  --  115*  BUN 128* 142* 134* 127*  --  123*  CREATININE 3.66* 3.64* 3.42* 3.15*  --  2.75*  CALCIUM 6.8* 6.7* 7.1* 6.9*  --  7.1*  MG  --   --   --   --  1.7  --    GFR: Estimated Creatinine Clearance: 24.8 mL/min (A) (by C-G formula based on SCr of 2.75 mg/dL (H)).  Liver Function Tests: Recent Labs  Lab 05/14/23 0202 05/14/23 1030 05/14/23 1516  AST 13* 20 11*  ALT 9 9 10   ALKPHOS 42 41 45  BILITOT 0.7 1.1 0.7  PROT 6.0* 5.8* 6.2*  ALBUMIN 3.1* 2.9* 3.3*   No results for input(s): "LIPASE", "AMYLASE" in the last 168 hours. No results for input(s): "AMMONIA" in the last  168 hours.  CBC: Recent Labs  Lab 05/13/23 2259 05/14/23 0202 05/15/23 0550  WBC 6.0 6.7 7.9  NEUTROABS 4.9  --   --   HGB 9.6* 9.1* 8.9*  HCT 29.7* 28.3* 27.1*  MCV 92.2 93.1 88.3  PLT 207 205 190     HbA1C: Hgb A1c MFr Bld  Date/Time Value Ref Range Status  04/30/2023 05:11 AM 9.1 (H) 4.8 - 5.6 % Final    Comment:    (NOTE)         Prediabetes: 5.7 - 6.4         Diabetes: >6.4         Glycemic control for adults with diabetes: <7.0   05/24/2019 09:29 AM 10.4 (H) 4.6 - 6.5 % Final    Comment:    Glycemic Control Guidelines for People with Diabetes:Non Diabetic:  <6%Goal of Therapy: <7%Additional Action Suggested:  >8%     Urinalysis: Recent Labs    05/14/23 1430  COLORURINE YELLOW*  LABSPEC 1.013  PHURINE 5.0  GLUCOSEU 50*  HGBUR SMALL*  BILIRUBINUR NEGATIVE  KETONESUR NEGATIVE  PROTEINUR 30*  NITRITE NEGATIVE  LEUKOCYTESUR LARGE*  Imaging: DG Chest Port 1 View  Result Date: 05/14/2023 CLINICAL DATA:  Intubation. EXAM: PORTABLE CHEST 1 VIEW COMPARISON:  Chest radiograph dated 05/14/2023. FINDINGS: An endotracheal tube terminates in the midthoracic trachea. A left internal jugular central venous catheter tip overlies the superior vena cava. An enteric tube terminates in the stomach. The heart is enlarged. Moderate bilateral interstitial and airspace opacities appear similar to prior exam. A left pleural effusion may contribute. There is no right pleural effusion. There is no pneumothorax. Degenerative changes are seen in the spine. IMPRESSION: Enteric tube terminates in the midthoracic trachea. Electronically Signed   By: Romona Curls M.D.   On: 05/14/2023 21:33   DG Chest Portable 1 View  Result Date: 05/14/2023 CLINICAL DATA:  post intubation EXAM: PORTABLE CHEST 1 VIEW COMPARISON:  May 14, 2023 FINDINGS: Low lung volume radiograph. The cardiomediastinal silhouette is unchanged and enlarged in contour.ETT tip terminates approximately 2 cm above the  carina. The enteric tube courses through the chest to the abdomen beyond the field-of-view. No large pleural effusion. No pneumothorax. Patchy bilateral airspace opacities and pulmonary vascular congestion, similar in comparison to prior. IMPRESSION: 1. Support apparatus as described above. 2. Similar appearance of patchy bilateral airspace opacities and pulmonary vascular congestion. Electronically Signed   By: Meda Klinefelter M.D.   On: 05/14/2023 14:59   DG Chest Port 1 View  Result Date: 05/14/2023 CLINICAL DATA:  Preop EXAM: PORTABLE CHEST 1 VIEW COMPARISON:  02/27/2023 FINDINGS: Cardiomegaly, vascular congestion. Mediastinal contours within normal limits. Linear subsegmental atelectasis in the right mid lung. No overt edema or confluent opacities. No effusions or acute bony abnormality. IMPRESSION: Cardiomegaly, vascular congestion. Electronically Signed   By: Charlett Nose M.D.   On: 05/14/2023 01:34   CT ABDOMEN PELVIS WO CONTRAST  Result Date: 05/14/2023 CLINICAL DATA:  Kidney failure, acute EXAM: CT ABDOMEN AND PELVIS WITHOUT CONTRAST TECHNIQUE: Multidetector CT imaging of the abdomen and pelvis was performed following the standard protocol without IV contrast. RADIATION DOSE REDUCTION: This exam was performed according to the departmental dose-optimization program which includes automated exposure control, adjustment of the mA and/or kV according to patient size and/or use of iterative reconstruction technique. COMPARISON:  None Available. FINDINGS: Lower chest: Small bilateral pleural effusions. Bilateral lower lobe airspace disease, left greater than right. Mild cardiomegaly. Diffuse coronary artery calcifications. Hepatobiliary: Layering high-density material within the gallbladder could reflect small stones/gravel. No focal hepatic abnormality or biliary ductal dilatation. Pancreas: No focal abnormality or ductal dilatation. Spleen: No focal abnormality.  Normal size. Adrenals/Urinary Tract:  No adrenal abnormality. No suspicious focal renal abnormality. Small exophytic cyst off the lower pole of the left kidney measuring 3 cm. No follow-up imaging recommended. No stones or hydronephrosis. Urinary bladder is unremarkable. Stomach/Bowel: Few scattered colonic diverticula. No active diverticulitis. Stomach, large and small bowel grossly unremarkable. Vascular/Lymphatic: Aortic atherosclerosis. No evidence of aneurysm or adenopathy. Reproductive: Prior hysterectomy.  No adnexal masses. Other: No free fluid or free air. Musculoskeletal: No acute bony abnormality. IMPRESSION: Small bilateral pleural effusions. Bibasilar atelectasis or infiltrates, left greater than right. Coronary artery disease, aortic atherosclerosis. Layering high-density material in the gallbladder could reflect small stones/gravel. No hydronephrosis. Scattered colonic diverticula. Electronically Signed   By: Charlett Nose M.D.   On: 05/14/2023 01:34   CT HEAD WO CONTRAST ( )  Result Date: 05/14/2023 CLINICAL DATA:  Elevated potassium, altered mental status EXAM: CT HEAD WITHOUT CONTRAST TECHNIQUE: Contiguous axial images were obtained from the base of the skull through the vertex without  intravenous contrast. RADIATION DOSE REDUCTION: This exam was performed according to the departmental dose-optimization program which includes automated exposure control, adjustment of the mA and/or kV according to patient size and/or use of iterative reconstruction technique. COMPARISON:  04/29/2023 FINDINGS: Brain: No evidence of acute infarction, hemorrhage, mass, mass effect, or midline shift. No hydrocephalus or extra-axial fluid collection. Periventricular white matter changes, likely the sequela of chronic small vessel ischemic disease. Vascular: No hyperdense vessel. Skull: Negative for fracture or focal lesion. Sinuses/Orbits: Air-fluid level in the right sphenoid sinus. Mild mucosal thickening in the ethmoid air cells and right maxillary  sinus. Status post bilateral lens replacements. Other: The mastoid air cells are well aerated. IMPRESSION: No acute intracranial process. Electronically Signed   By: Wiliam Ke M.D.   On: 05/14/2023 01:27     Medications:    sodium chloride 50 mL/hr at 05/15/23 0602   fentaNYL infusion INTRAVENOUS 300 mcg/hr (05/15/23 0846)   furosemide (LASIX) 200 mg in dextrose 5 % 100 mL (2 mg/mL) infusion 5 mg/hr (05/15/23 0600)   norepinephrine (LEVOPHED) Adult infusion 8 mcg/min (05/15/23 0859)   propofol (DIPRIVAN) infusion 25 mcg/kg/min (05/15/23 0940)    atorvastatin  40 mg Oral Daily   Chlorhexidine Gluconate Cloth  6 each Topical Daily   fluticasone furoate-vilanterol  1 puff Inhalation Daily   insulin aspart  0-6 Units Subcutaneous Q4H   mouth rinse  15 mL Mouth Rinse Q2H   sodium chloride flush  3 mL Intravenous Q12H    Assessment/ Plan:     67 year old female with a PMHx of morbid obesity, obstructive sleep apnea, bedbound status for 3 years, type 2 diabetes, hypertension, coronary artery disease, congestive heart failure, recent history of cardiac catheterization on 6//6/24.  In the ED she was found to be lethargic with a blood pressure 113/50 and elevated creatinine and potassium.  She was given insulin and glucose and also was placed on sodium bicarbonate drip.  The ED attempted to place a Foley catheter but was unsuccessful.  She has been on losartan, metformin and spironolactone.     Principal Problem:   AKI (acute kidney injury) (HCC) Active Problems:   Essential hypertension   Pulmonary embolism (HCC)   OSA (obstructive sleep apnea)   Altered mental status   Hyperkalemia   Hyponatremia   Anemia   #1: Acute kidney injury: Patient probably has acute kidney injury for the last 1 week after the IV contrast.  The hyperkalemia can be secondary to losartan complicated by spironolactone.  She was started on Lasix drip at 5 mg an hour with significant improvement in urine output.  I  would like to continue the same for now.  There is no need for dialysis initiation today.    #2: Hyperkalemia: Patient received doses of insulin along with glucose and sodium bicarbonate.  She was also given Lokelma.  Will give another dose of insulin along with dextrose.  Potassium improved to normal levels.  Presently losartan and spironolactone are discontinued.   #3: Hyponatremia: This is most likely secondary to dilutional causes with volume overloaded.  Will continue aggressive diuresis with furosemide IV.  #4: Vent failure/respiratory acidosis: This may be secondary to CO2 narcosis.  Patient is presently on ventilator.  Further care as far critical care and pulmonary.   #5: Coronary artery disease/congestive heart failure/hypertension: Cardiology note appreciated.     #6: Anemia: Most likely secondary to iron deficiency/acute kidney injury.   Overall prognosis is poor.  No need for dialysis initiation at  this time.    LOS: 1 Lorain Childes, MD [facility] 6/16/202410:44 AM

## 2023-05-15 NOTE — Progress Notes (Signed)
CRITICAL CARE PROGRESS NOTE    Name: Maryem Valois MRN: [id number] DOB: [birth year]     LOS: 1   SUBJECTIVE FINDINGS & SIGNIFICANT EVENTS    History of Presenting Illness:  67 yo F with obesity, from nursing home. She has as history of PE, DVT, aortic stenosis, HTN, chronic bronchitis, OSA, AKI, dermatomyositis, DM2, dyslipidemia, came in due to worsening renal function noted on bloodwork.  She was noted to be lethargic, denied pain anywhere. She was found to have severe hypercapnic acidemia.  She was placed on BIPAP.  PCCM was consulted to due AMS lethargy on BIPAP.    05/15/23- patient s/p TTE with EF 20%, RASS-zero on low sedation, FiO2 28%. For SBT.   Lines/tubes : External Urinary Catheter (Active)  Collection Container Dedicated Suction Canister 05/14/23 0236  Suction (Verified suction is between 40-80 mmHg) Yes 05/14/23 0236  Securement Method Securing device (Describe) 05/14/23 0236  Site Assessment Clean, Dry, Intact 05/14/23 0236    Microbiology/Sepsis markers: Results for orders placed or performed during the hospital encounter of 05/13/23  Respiratory (~20 pathogens) panel by PCR     Status: None   Collection Time: 05/14/23  4:26 PM   Specimen: Nasopharyngeal Swab; Respiratory  Result Value Ref Range Status   Adenovirus NOT DETECTED NOT DETECTED Final   Coronavirus 229E NOT DETECTED NOT DETECTED Final    Comment: (NOTE) The Coronavirus on the Respiratory Panel, DOES NOT test for the novel  Coronavirus (2019 nCoV)    Coronavirus HKU1 NOT DETECTED NOT DETECTED Final   Coronavirus NL63 NOT DETECTED NOT DETECTED Final   Coronavirus OC43 NOT DETECTED NOT DETECTED Final   Metapneumovirus NOT DETECTED NOT DETECTED Final   Rhinovirus / Enterovirus NOT DETECTED NOT DETECTED Final   Influenza A NOT  DETECTED NOT DETECTED Final   Influenza B NOT DETECTED NOT DETECTED Final   Parainfluenza Virus 1 NOT DETECTED NOT DETECTED Final   Parainfluenza Virus 2 NOT DETECTED NOT DETECTED Final   Parainfluenza Virus 3 NOT DETECTED NOT DETECTED Final   Parainfluenza Virus 4 NOT DETECTED NOT DETECTED Final   Respiratory Syncytial Virus NOT DETECTED NOT DETECTED Final   Bordetella pertussis NOT DETECTED NOT DETECTED Final   Bordetella Parapertussis NOT DETECTED NOT DETECTED Final   Chlamydophila pneumoniae NOT DETECTED NOT DETECTED Final   Mycoplasma pneumoniae NOT DETECTED NOT DETECTED Final    Comment: Performed at [facility], [address], [place], [place] [address]  MRSA Next Gen by PCR, Nasal     Status: Abnormal   Collection Time: 05/14/23  4:28 PM   Specimen: Nasal Mucosa; Nasal Swab  Result Value Ref Range Status   MRSA by PCR Next Gen DETECTED (A) NOT DETECTED Final    Comment: CRITICAL RESULT CALLED TO, READ BACK BY AND VERIFIED WITH: Altamese  RN ICU @ 2013 05/14/23 LFD (NOTE) The GeneXpert MRSA Assay (FDA approved for NASAL specimens only), is one component of a comprehensive MRSA colonization surveillance program. It is not intended to diagnose MRSA infection nor to guide or monitor treatment for MRSA infections. Test performance is not FDA approved in patients less than 46 years old. Performed at [facility], [address], [place], [place] [address]     Anti-infectives:  Anti-infectives (From admission, onward)    None         PAST MEDICAL HISTORY   Past Medical History:  Diagnosis Date   Calculus of kidney    Diabetes mellitus without complication (HCC)  DVT (deep venous thrombosis) (HCC)    a. on Eliquis   Family history of early CAD    a. parents passing in their 52's from CAD   Gout, unspecified    Heart murmur    Hyperlipidemia    Hypertension    Morbid obesity (HCC)    Normal cardiac stress test    a. equivocal study, sig  soft tissue artifact present, no chest discomfort or ECG changes, perfusion images suggest mod sized region of mild reversible perfusion defect. Findings may be 2/2 shifting soft tissue attenuation, but cannot r/o ischemia, EF 72%   Phlebitis and thrombophlebitis of other deep vessels of lower extremities    Phlebitis and thrombophlebitis of other deep vessels of lower extremities    Pulmonary emboli (HCC)    a. on Eliquis   Spinal stenosis, unspecified region other than cervical    Tobacco abuse    Type II or unspecified type diabetes mellitus without mention of complication, uncontrolled    Unspecified sleep apnea    Urinary tract infection, site not specified      SURGICAL HISTORY   Past Surgical History:  Procedure Laterality Date   ABDOMINAL HYSTERECTOMY     hyperplasia of endometrium   ANKLE SURGERY     fracture s/p pin, right ankle   APPENDECTOMY     CHOLECYSTECTOMY     COLECTOMY     temporary colostomy, now reversed   INTESTINAL BYPASS     ovarian cyst ruptured, led to perforated intestine   LEFT HEART CATH AND CORONARY ANGIOGRAPHY N/A 05/04/2023   Procedure: LEFT HEART CATH AND CORONARY ANGIOGRAPHY;  Surgeon: Yvonne Kendall, MD;  Location: [facility] INVASIVE CV LAB;  Service: Cardiovascular;  Laterality: N/A;   STOMACH SURGERY       FAMILY HISTORY   Family History  Problem Relation Age of Onset   Heart attack Mother 90   Cancer Mother        breast   Heart attack Father 19   Arthritis Sister      SOCIAL HISTORY   Social History   Tobacco Use   Smoking status: Former    Packs/day: 1.00    Years: 10.00    Additional pack years: 0.00    Total pack years: 10.00    Types: Cigarettes    Quit date: 12/26/1999    Years since quitting: 23.4   Smokeless tobacco: Never  Substance Use Topics   Alcohol use: Yes    Comment: socially   Drug use: No     MEDICATIONS   Current Medication:  Current Facility-Administered Medications:    0.9 %  sodium chloride  infusion, , Intravenous, Continuous, Korrapati, Madhu, MD, Last Rate: 50 mL/hr at 05/15/23 0602, New Bag at 05/15/23 0602   acetaminophen (TYLENOL) tablet 650 mg, 650 mg, Oral, Q6H PRN **OR** acetaminophen (TYLENOL) suppository 650 mg, 650 mg, Rectal, Q6H PRN, Nolberto Hanlon, MD   atorvastatin (LIPITOR) tablet 40 mg, 40 mg, Oral, Daily, Nolberto Hanlon, MD, 40 mg at 05/15/23 0845   Chlorhexidine Gluconate Cloth 2 % PADS 6 each, 6 each, Topical, Daily, Vida Rigger, MD, 6 each at 05/15/23 0845   fentaNYL in NS (43mcg/ml) infusion-PREMIX, 0-400 mcg/hr, Intravenous, Continuous, Rolf Fells, MD, Last Rate: 30 mL/hr at 05/15/23 0846, 300 mcg/hr at 05/15/23 0846   fluticasone furoate-vilanterol (BREO ELLIPTA) 100-25 MCG/ACT 1 puff, 1 puff, Inhalation, Daily, Nolberto Hanlon, MD, 1 puff at 05/14/23 0921   furosemide (LASIX) 200 mg in dextrose 5 % 100  mL (2 mg/mL) infusion, 5 mg/hr, Intravenous, Continuous, Korrapati, Madhu, MD, Last Rate: 2.5 mL/hr at 05/15/23 0600, 5 mg/hr at 05/15/23 0600   insulin aspart (novoLOG) injection 0-6 Units, 0-6 Units, Subcutaneous, Q4H, Ouma, Hubbard Hartshorn, NP   magnesium sulfate IVPB 2 g 50 mL, 2 g, Intravenous, Once, Tressie Ellis, RPH   norepinephrine (LEVOPHED) 4mg  in (0.016 mg/mL) premix infusion, 0-40 mcg/min, Intravenous, Titrated, Ouma, Hubbard Hartshorn, NP, Last Rate: 30 mL/hr at 05/15/23 0600, 8 mcg/min at 05/15/23 0600   Oral care mouth rinse, 15 mL, Mouth Rinse, Q2H, Digna Countess, MD, 15 mL at 05/15/23 0725   Oral care mouth rinse, 15 mL, Mouth Rinse, PRN, Karna Christmas, Katerina Zurn, MD   propofol (DIPRIVAN) 1000 MG/100ML infusion, 5-80 mcg/kg/min, Intravenous, Titrated, Ouma, Hubbard Hartshorn, NP, Last Rate: 19.29 mL/hr at 05/15/23 0729, 25 mcg/kg/min at 05/15/23 0729   sodium chloride flush (NS) 0.9 % injection 3 mL, 3 mL, Intravenous, Q12H, Nolberto Hanlon, MD, 3 mL at 05/15/23 0846    ALLERGIES   Sulfa antibiotics    REVIEW OF SYSTEMS      10 point ROS unable to obtain due to encephalopathy  PHYSICAL EXAMINATION   Vital Signs: Temp:  [96.6 F (35.9 C)-99.5 F (37.5 C)] 99.5 F (37.5 C) (06/16 0800) Pulse Rate:  [50-67] 65 (06/16 0800) Resp:  [13-21] 18 (06/16 0800) BP: (76-158)/(34-111) 117/37 (06/16 0800) SpO2:  [91 %-100 %] 98 % (06/16 0800) FiO2 (%):  [28 %-30 %] 28 % (06/16 0758) Weight:  [128.3 kg-128.6 kg] 128.3 kg (06/16 0500)  GENERAL:encephalopathic, NCAT age appropriate HEAD: Normocephalic, atraumatic.  EYES: Pupils equal, round, reactive to light.  No scleral icterus.  MOUTH: Moist mucosal membrane. NECK: Supple. No thyromegaly. No nodules. No JVD.  PULMONARY: decreased air entry CARDIOVASCULAR: S1 and S2. Regular rate and rhythm. No murmurs, rubs, or gallops.  GASTROINTESTINAL: Soft, nontender, non-distended. No masses. Positive bowel sounds. No hepatosplenomegaly.  MUSCULOSKELETAL: No swelling, clubbing, or edema.  NEUROLOGIC: Mild distress due to acute illness SKIN:intact,warm,dry   PERTINENT DATA     Infusions:  sodium chloride 50 mL/hr at 05/15/23 0602   fentaNYL infusion INTRAVENOUS 300 mcg/hr (05/15/23 0846)   furosemide (LASIX) 200 mg in dextrose 5 % 100 mL (2 mg/mL) infusion 5 mg/hr (05/15/23 0600)   magnesium sulfate bolus IVPB     norepinephrine (LEVOPHED) Adult infusion 8 mcg/min (05/15/23 0600)   propofol (DIPRIVAN) infusion 25 mcg/kg/min (05/15/23 0729)   Scheduled Medications:  atorvastatin  40 mg Oral Daily   Chlorhexidine Gluconate Cloth  6 each Topical Daily   fluticasone furoate-vilanterol  1 puff Inhalation Daily   insulin aspart  0-6 Units Subcutaneous Q4H   mouth rinse  15 mL Mouth Rinse Q2H   sodium chloride flush  3 mL Intravenous Q12H   PRN Medications: acetaminophen **OR** acetaminophen, mouth rinse Hemodynamic parameters:   Intake/Output: 06/15 0701 - 06/16 0700 In: 1409.8 [I.V.:1409.8] Out: 3825 [Urine:3825]  Ventilator  Settings: Vent Mode:  PRVC FiO2 (%):  [28 %-30 %] 28 % Set Rate:  [16 bmp] 16 bmp Vt Set:  [450 mL] 450 mL PEEP:  [5 cmH20-10 cmH20] 5 cmH20 Plateau Pressure:  [27 cmH20] 27 cmH20   LAB RESULTS:  Basic Metabolic Panel: Recent Labs  Lab 05/14/23 0202 05/14/23 1030 05/14/23 1516 05/14/23 2155 05/15/23 0550 05/15/23 0551  NA 128* 127* 130* 129*  --  131*  K 5.5* 6.2* 5.3* 5.4*  --  4.8  CL 93* 90* 92* 95*  --  95*  CO2 21* 22 21* 20*  --  22  GLUCOSE 128* 142* 122* 137*  --  115*  BUN 128* 142* 134* 127*  --  123*  CREATININE 3.66* 3.64* 3.42* 3.15*  --  2.75*  CALCIUM 6.8* 6.7* 7.1* 6.9*  --  7.1*  MG  --   --   --   --  1.7  --     Liver Function Tests: Recent Labs  Lab 05/14/23 0202 05/14/23 1030 05/14/23 1516  AST 13* 20 11*  ALT 9 9 10   ALKPHOS 42 41 45  BILITOT 0.7 1.1 0.7  PROT 6.0* 5.8* 6.2*  ALBUMIN 3.1* 2.9* 3.3*    No results for input(s): "LIPASE", "AMYLASE" in the last 168 hours. No results for input(s): "AMMONIA" in the last 168 hours. CBC: Recent Labs  Lab 05/13/23 2259 05/14/23 0202 05/15/23 0550  WBC 6.0 6.7 7.9  NEUTROABS 4.9  --   --   HGB 9.6* 9.1* 8.9*  HCT 29.7* 28.3* 27.1*  MCV 92.2 93.1 88.3  PLT 207 205 190    Cardiac Enzymes: Recent Labs  Lab 05/14/23 1525  CKTOTAL 59   BNP: Invalid input(s): "POCBNP" CBG: Recent Labs  Lab 05/14/23 1933 05/14/23 2333 05/15/23 0348 05/15/23 0350 05/15/23 0738  GLUCAP 126* 124* 42* 120* 102*        IMAGING RESULTS:     ASSESSMENT AND PLAN    -Multidisciplinary rounds held today  Acute Hypoxic Hypercapnic  Respiratory Failure -failure of BIPAP will prepare for endotracheal intubation , patient lethargic severely acidemic with hypercapnia -ventilator management - 8cc/kg for first 24h -diuresis per cardiology  -repeat ABG improved 05/15/23   Acute on chronic diastolic heart failure    Continue diuresis   - cardio on case appreciate input -oxygen as needed -Lasix as tolerated -cardiac  telemetry monitoring  Renal Failure-most likely due to ischemia secondary to hypotension  -follow chem 7 -follow UO -continue Foley Catheter-assess need daily -nephrology on case appreciate input  Altered mental status with encephalopathy   - suspect CO2 induced hypercapnic encephalopathy - intubated and sedated - minimal sedation to achieve a RASS goal: -1 Wake up assessment pending   Dermatomyositis   Chronic stable condition - CK within reference range.  ESR/CRP trend - CRP is mildly elevated  Circulatory shock-     - present on admission - due to cardiac failure vs septic shock     - continue IV abx, remains on vasopressors   ID -continue IV abx as prescibed -follow up cultures  GI/Nutrition GI PROPHYLAXIS as indicated DIET-->TF's as tolerated Constipation protocol as indicated  ENDO - ICU hypoglycemic\Hyperglycemia protocol -check FSBS per protocol   ELECTROLYTES -follow labs as needed -replace as needed -pharmacy consultation   DVT/GI PRX ordered -SCDs  TRANSFUSIONS AS NEEDED MONITOR FSBS ASSESS the need for LABS as needed    Critical care provider statement:   Total critical care time: 33 minutes   Performed by: Karna Christmas MD   Critical care time was exclusive of separately billable procedures and treating other patients.   Critical care was necessary to treat or prevent imminent or life-threatening deterioration.   Critical care was time spent personally by me on the following activities: development of treatment plan with patient and/or surrogate as well as nursing, discussions with consultants, evaluation of patient's response to treatment, examination of patient, obtaining history from patient or surrogate, ordering and performing treatments and interventions, ordering and review of laboratory studies, ordering and review of radiographic studies,  pulse oximetry and re-evaluation of patient's condition.    Vida Rigger, M.D.  Pulmonary &  Critical Care Medicine

## 2023-05-15 NOTE — Procedures (Signed)
Central Venous Catheter Insertion Procedure Note  Sonya Sanchez  [id number]  1956-02-26  Date:05/15/23  Time:1:52 AM   Provider Performing:Connelly Spruell [name]   Procedure: Insertion of Non-tunneled Central Venous Catheter(36556) with US guidance (40981)   Indication(s) Hemodialysis  Consent Unable to obtain consent due to emergent nature of procedure.  Anesthesia Topical only with 1% lidocaine   Timeout Verified patient identification, verified procedure, site/side was marked, verified correct patient position, special equipment/implants available, medications/allergies/relevant history reviewed, required imaging and test results available.  Sterile Technique Maximal sterile technique including full sterile barrier drape, hand hygiene, sterile gown, sterile gloves, mask, hair covering, sterile ultrasound probe cover (if used).  Procedure Description Area of catheter insertion was cleaned with chlorhexidine and draped in sterile fashion.  With real-time ultrasound guidance a central venous catheter was placed into the left internal jugular vein. Nonpulsatile blood flow and easy flushing noted in all ports.  The catheter was sutured in place and sterile dressing applied.  Complications/Tolerance None; patient tolerated the procedure well. Chest X-ray is ordered to verify placement for internal jugular or subclavian cannulation.   Chest x-ray is not ordered for femoral cannulation.  EBL Minimal  Specimen(s) None   Webb Silversmith, DNP, CCRN, FNP-C, AGACNP-BC Acute Care & Family Nurse Practitioner  [facility]  See Amion for personal pager PCCM on call pager [phone] until 7 am

## 2023-05-15 NOTE — Hospital Course (Addendum)
67 yo F with obesity, from nursing home. She has as history of PE, DVT, aortic stenosis, HTN, chronic bronchitis, OSA, AKI, dermatomyositis, DM2, dyslipidemia, came in due to worsening renal function noted on bloodwork.  She was noted to be lethargic, denied pain anywhere. She was found to have severe hypercapnic acidemia.  She was placed on BIPAP.  PCCM was consulted to due AMS lethargy on BIPAP.    Significant hospital events and procedures 05/15/23 central line placed, nephrology initiated dialysis, cardiology consult 05/16/2023-Lasix drip increased, metolazone added 6/18-started antibiotics due to increasing WBC count, pancultures sent 6/19 extubated.  Had respiratory distress and was put on BiPAP.  Started amnio for rapid A-fib. 6/20: lasix drip stopped. 6/21: rested off BPAP. Phenyl drip. Nephro s/o, d/c dialysis  6/22: started on midodrine. 6/24 started torsemide 20 mg p.o. daily as per cardiology 6/25 started spironolactone 25 mg p.o. daily 6/26: amiodarone drip following electrocardioversion for Afib 6/27: po amiodarone, titrating meds   Consultants:  Palliative care  Cardiology  Nephrology  PCCM  Procedures: Cardioversion  Intubation/extubation Central line placement and hemodialysis       ASSESSMENT & PLAN:   Principal Problem:   AKI (acute kidney injury) (HCC) Active Problems:   Altered mental status   Essential hypertension   Pulmonary embolism (HCC)   OSA (obstructive sleep apnea)   Hyperkalemia   Hyponatremia   Anemia  # Acute on chronic diastolic heart failure Echo (6/24) with EF 60-655, mild LVH, normal RV, mild mitral stenosis mean gradient 7, moderate aortic stenosis with AVA 1.08 cm^2 mean gradient 10, IVC dilated.  Continue torsemide 20 mg daily.  Hold off on Jardiance for now with recent E coli UTI.  Continue spironolactone 25 mg daily.  Given elevated blood pressure, I resumed Toprol 25 mg twice daily.   # Atrial fibrillation anticoagulation  with Eliquis Cardiology following, status post electrocardioversion done on 6/26 Continue to monitor on telemetry and follow cardiology for further management Stopped amio gtt, now on po    # AKI secondary to IV contrast, ATN # Hyperkalemia-resolved Creatinine 1.36, improving Monitor renal functions and urine output     Sepsis.  Possible sources are hospital-acquired pneumonia versus e coli urosepsis Right lower lobe infiltrate on chest x-ray MRSA positive- s/p vancomycin DC'd on 6/24 after a.m. dose and started doxycycline 100 mg p.o. twice daily for 7 days, Continue zosyn as E coli in urine is resistant to cefepime.  S/p Neo-Synephrine. Procalcitonin is elevated.   I suspect that some of this is diastolic dysfunction; starting midodrine (6/22) to help with BP support S/p midodrine, which was discontinued, blood pressure improved.   6/25  repeat blood cultures NGTD   Acute on chronic hypoxic, hypercarbic respiratory failure secondary to CHF History of OSA, morbid obesity Required BiPAP postextubation. transition from BiPAP to high flow nasal cannula  Continue BiPAP at night and during sleep for history of severe OSA Continue supplemental O2 [place] and gradually wean off    Dermatomyositis Monitor CK Off azathioprine due to concern for sepsis.   H/O DVT, PE Continue Eliquis   Diabetes mellitus SSI coverage Hyperglycemia remains a  challenge Increased Lantus 30u BID   Morbid obesity Body mass index is 45.79 kg/m.  Nutrition Problem: Inadequate oral intake Etiology: inability to eat (pt sedated and ventilated) Interventions: Interventions: Ensure Enlive (each supplement provides 350kcal and 20 grams of protein), MVI, Magic cup   Advanced care planning "she does not want to consider or discuss care moving forward, and  states she will make decisions as needed at the time.  She states that if she is unable to make decisions herself, her sister will be able to.  She is amenable  to outpatient palliative to follow. " See palliative care note 05/26/23

## 2023-05-15 NOTE — Progress Notes (Signed)
Progress Note  Patient Name: Sonya Sanchez Date of Encounter: 05/15/2023  Primary Cardiologist: Julien Nordmann, MD   Subjective   Remains intubated and sedated.  Inpatient Medications    Scheduled Meds:  atorvastatin  40 mg Oral Daily   Chlorhexidine Gluconate Cloth  6 each Topical Daily   fluticasone furoate-vilanterol  1 puff Inhalation Daily   insulin aspart  0-6 Units Subcutaneous Q4H   mouth rinse  15 mL Mouth Rinse Q2H   sodium chloride flush  3 mL Intravenous Q12H   Continuous Infusions:  sodium chloride 50 mL/hr at 05/15/23 0602   fentaNYL infusion INTRAVENOUS Stopped (05/15/23 1103)   furosemide (LASIX) 200 mg in dextrose 5 % 100 mL (2 mg/mL) infusion 5 mg/hr (05/15/23 0600)   norepinephrine (LEVOPHED) Adult infusion 2 mcg/min (05/15/23 1211)   propofol (DIPRIVAN) infusion Stopped (05/15/23 1103)   PRN Meds: acetaminophen **OR** acetaminophen, mouth rinse   Vital Signs    Vitals:   05/15/23 0900 05/15/23 1000 05/15/23 1100 05/15/23 1155  BP: (!) 96/36 (!) 108/36 (!) 111/39   Pulse: 61 (!) 56 (!) 57   Resp: 16 14 17    Temp: 99.5 F (37.5 C) 99.1 F (37.3 C) 99.1 F (37.3 C)   TempSrc:      SpO2: 92% 95% 97% 100%  Weight:      Height:        Intake/Output Summary (Last 24 hours) at 05/15/2023 1223 Last data filed at 05/15/2023 1202 Gross per 24 hour  Intake 1409.78 ml  Output 4700 ml  Net -3290.22 ml   Filed Weights   05/14/23 0703 05/14/23 1624 05/15/23 0500  Weight: 128.6 kg 128.6 kg 128.3 kg    Telemetry    Sinus brady at 55/min - Personally Reviewed  ECG    none - Personally Reviewed  Physical Exam   GEN: ETT in place. Neck: No JVD Cardiac: Reg brady, no murmurs, rubs, or gallops.  Respiratory: Clear to auscultation bilaterally. GI: Soft, nontender, non-distended morbidly obese MS: No edema; No deformity. Neuro:  Nonfocal  Psych: Normal affect   Labs    Chemistry Recent Labs  Lab 05/14/23 0202 05/14/23 1030 05/14/23 1516  05/14/23 2155 05/15/23 0551  NA 128* 127* 130* 129* 131*  K 5.5* 6.2* 5.3* 5.4* 4.8  CL 93* 90* 92* 95* 95*  CO2 21* 22 21* 20* 22  GLUCOSE 128* 142* 122* 137* 115*  BUN 128* 142* 134* 127* 123*  CREATININE 3.66* 3.64* 3.42* 3.15* 2.75*  CALCIUM 6.8* 6.7* 7.1* 6.9* 7.1*  PROT 6.0* 5.8* 6.2*  --   --   ALBUMIN 3.1* 2.9* 3.3*  --   --   AST 13* 20 11*  --   --   ALT 9 9 10   --   --   ALKPHOS 42 41 45  --   --   BILITOT 0.7 1.1 0.7  --   --   GFRNONAA 13* 13* 14* 15* 18*  ANIONGAP 14 15 17* 14 14     Hematology Recent Labs  Lab 05/13/23 2259 05/14/23 0202 05/15/23 0550  WBC 6.0 6.7 7.9  RBC 3.22* 3.04*  2.99* 3.07*  HGB 9.6* 9.1* 8.9*  HCT 29.7* 28.3* 27.1*  MCV 92.2 93.1 88.3  MCH 29.8 29.9 29.0  MCHC 32.3 32.2 32.8  RDW 19.2* 19.5* 18.8*  PLT 207 205 190    Cardiac EnzymesNo results for input(s): "TROPONINI" in the last 168 hours. No results for input(s): "TROPIPOC" in the last 168 hours.  BNPNo results for input(s): "BNP", "PROBNP" in the last 168 hours.   DDimer No results for input(s): "DDIMER" in the last 168 hours.   Radiology    DG Chest Port 1 View  Result Date: 05/14/2023 CLINICAL DATA:  Intubation. EXAM: PORTABLE CHEST 1 VIEW COMPARISON:  Chest radiograph dated 05/14/2023. FINDINGS: An endotracheal tube terminates in the midthoracic trachea. A left internal jugular central venous catheter tip overlies the superior vena cava. An enteric tube terminates in the stomach. The heart is enlarged. Moderate bilateral interstitial and airspace opacities appear similar to prior exam. A left pleural effusion may contribute. There is no right pleural effusion. There is no pneumothorax. Degenerative changes are seen in the spine. IMPRESSION: Enteric tube terminates in the midthoracic trachea. Electronically Signed   By: Romona Curls M.D.   On: 05/14/2023 21:33   DG Chest Portable 1 View  Result Date: 05/14/2023 CLINICAL DATA:  post intubation EXAM: PORTABLE CHEST 1  VIEW COMPARISON:  May 14, 2023 FINDINGS: Low lung volume radiograph. The cardiomediastinal silhouette is unchanged and enlarged in contour.ETT tip terminates approximately 2 cm above the carina. The enteric tube courses through the chest to the abdomen beyond the field-of-view. No large pleural effusion. No pneumothorax. Patchy bilateral airspace opacities and pulmonary vascular congestion, similar in comparison to prior. IMPRESSION: 1. Support apparatus as described above. 2. Similar appearance of patchy bilateral airspace opacities and pulmonary vascular congestion. Electronically Signed   By: Meda Klinefelter M.D.   On: 05/14/2023 14:59   DG Chest Port 1 View  Result Date: 05/14/2023 CLINICAL DATA:  Preop EXAM: PORTABLE CHEST 1 VIEW COMPARISON:  02/27/2023 FINDINGS: Cardiomegaly, vascular congestion. Mediastinal contours within normal limits. Linear subsegmental atelectasis in the right mid lung. No overt edema or confluent opacities. No effusions or acute bony abnormality. IMPRESSION: Cardiomegaly, vascular congestion. Electronically Signed   By: Charlett Nose M.D.   On: 05/14/2023 01:34   CT ABDOMEN PELVIS WO CONTRAST  Result Date: 05/14/2023 CLINICAL DATA:  Kidney failure, acute EXAM: CT ABDOMEN AND PELVIS WITHOUT CONTRAST TECHNIQUE: Multidetector CT imaging of the abdomen and pelvis was performed following the standard protocol without IV contrast. RADIATION DOSE REDUCTION: This exam was performed according to the departmental dose-optimization program which includes automated exposure control, adjustment of the mA and/or kV according to patient size and/or use of iterative reconstruction technique. COMPARISON:  None Available. FINDINGS: Lower chest: Small bilateral pleural effusions. Bilateral lower lobe airspace disease, left greater than right. Mild cardiomegaly. Diffuse coronary artery calcifications. Hepatobiliary: Layering high-density material within the gallbladder could reflect small  stones/gravel. No focal hepatic abnormality or biliary ductal dilatation. Pancreas: No focal abnormality or ductal dilatation. Spleen: No focal abnormality.  Normal size. Adrenals/Urinary Tract: No adrenal abnormality. No suspicious focal renal abnormality. Small exophytic cyst off the lower pole of the left kidney measuring 3 cm. No follow-up imaging recommended. No stones or hydronephrosis. Urinary bladder is unremarkable. Stomach/Bowel: Few scattered colonic diverticula. No active diverticulitis. Stomach, large and small bowel grossly unremarkable. Vascular/Lymphatic: Aortic atherosclerosis. No evidence of aneurysm or adenopathy. Reproductive: Prior hysterectomy.  No adnexal masses. Other: No free fluid or free air. Musculoskeletal: No acute bony abnormality. IMPRESSION: Small bilateral pleural effusions. Bibasilar atelectasis or infiltrates, left greater than right. Coronary artery disease, aortic atherosclerosis. Layering high-density material in the gallbladder could reflect small stones/gravel. No hydronephrosis. Scattered colonic diverticula. Electronically Signed   By: Charlett Nose M.D.   On: 05/14/2023 01:34   CT HEAD WO CONTRAST ( )  Result Date: 05/14/2023  CLINICAL DATA:  Elevated potassium, altered mental status EXAM: CT HEAD WITHOUT CONTRAST TECHNIQUE: Contiguous axial images were obtained from the base of the skull through the vertex without intravenous contrast. RADIATION DOSE REDUCTION: This exam was performed according to the departmental dose-optimization program which includes automated exposure control, adjustment of the mA and/or kV according to patient size and/or use of iterative reconstruction technique. COMPARISON:  04/29/2023 FINDINGS: Brain: No evidence of acute infarction, hemorrhage, mass, mass effect, or midline shift. No hydrocephalus or extra-axial fluid collection. Periventricular white matter changes, likely the sequela of chronic small vessel ischemic disease. Vascular: No  hyperdense vessel. Skull: Negative for fracture or focal lesion. Sinuses/Orbits: Air-fluid level in the right sphenoid sinus. Mild mucosal thickening in the ethmoid air cells and right maxillary sinus. Status post bilateral lens replacements. Other: The mastoid air cells are well aerated. IMPRESSION: No acute intracranial process. Electronically Signed   By: Wiliam Ke M.D.   On: 05/14/2023 01:27    Cardiac Studies   See above  Patient Profile     67 y.o. female admitted with hypercapneic respiratory failure   Assessment & Plan    Acute on chronic diastolic heart failure - continue IV diuresis with lasix drip. Hyperkalemia - gone from 6.2 to 4.8. Continue VDRF - as per CCM service. She is improving. CAD - cath results noted. No invasive treatment.     For questions or updates, please contact [facility] Please consult [email or web address] for contact info under Cardiology/STEMI.      Signed, Lewayne Bunting, MD  05/15/2023, 12:23 PM

## 2023-05-15 NOTE — Consult Note (Signed)
PHARMACY CONSULT NOTE  Pharmacy Consult for Electrolyte Monitoring and Replacement   Recent Labs: Potassium (mmol/L)  Date Value  05/15/2023 4.8   Magnesium (mg/dL)  Date Value  16/08/9603 1.7   Calcium (mg/dL)  Date Value  54/07/8118 7.1 (L)   Albumin (g/dL)  Date Value  14/78/2956 3.3 (L)  01/09/2018 4.0   Sodium (mmol/L)  Date Value  05/15/2023 131 (L)  01/09/2018 138   Assessment: Patient is a 67 y/o F with medical history including morbid obesity, limited functional capacity, nursing home resident admitted with altered mentation, AKI, acute CHF. Pharmacy consulted to assist with electrolyte monitoring and replacement as indicated.  Diuresis: IV Lasix at 5 mg/hr  Goal of Therapy:  Electrolytes within normal limits  Plan:  --Mg 1.7, magnesium sulfate 2 g IV x 1 --Follow-up electrolytes with AM labs tomorrow  Tressie Ellis 05/15/2023 8:38 AM

## 2023-05-16 DIAGNOSIS — N179 Acute kidney failure, unspecified: Secondary | ICD-10-CM | POA: Diagnosis not present

## 2023-05-16 DIAGNOSIS — I5033 Acute on chronic diastolic (congestive) heart failure: Secondary | ICD-10-CM | POA: Diagnosis not present

## 2023-05-16 LAB — CBC WITH DIFFERENTIAL/PLATELET
Abs Immature Granulocytes: 0.04 10*3/uL (ref 0.00–0.07)
Basophils Absolute: 0.1 10*3/uL (ref 0.0–0.1)
Basophils Relative: 1 %
Eosinophils Absolute: 0.1 10*3/uL (ref 0.0–0.5)
Eosinophils Relative: 2 %
HCT: 27.1 % — ABNORMAL LOW (ref 36.0–46.0)
Hemoglobin: 9.1 g/dL — ABNORMAL LOW (ref 12.0–15.0)
Immature Granulocytes: 0 %
Lymphocytes Relative: 8 %
Lymphs Abs: 0.7 10*3/uL (ref 0.7–4.0)
MCH: 29.7 pg (ref 26.0–34.0)
MCHC: 33.6 g/dL (ref 30.0–36.0)
MCV: 88.6 fL (ref 80.0–100.0)
Monocytes Absolute: 0.7 10*3/uL (ref 0.1–1.0)
Monocytes Relative: 7 %
Neutro Abs: 7.6 10*3/uL (ref 1.7–7.7)
Neutrophils Relative %: 82 %
Platelets: 191 10*3/uL (ref 150–400)
RBC: 3.06 MIL/uL — ABNORMAL LOW (ref 3.87–5.11)
RDW: 19.4 % — ABNORMAL HIGH (ref 11.5–15.5)
WBC: 9.2 10*3/uL (ref 4.0–10.5)
nRBC: 0 % (ref 0.0–0.2)

## 2023-05-16 LAB — COMPREHENSIVE METABOLIC PANEL
ALT: 10 U/L (ref 0–44)
AST: 12 U/L — ABNORMAL LOW (ref 15–41)
Albumin: 2.9 g/dL — ABNORMAL LOW (ref 3.5–5.0)
Alkaline Phosphatase: 45 U/L (ref 38–126)
Anion gap: 17 — ABNORMAL HIGH (ref 5–15)
BUN: 107 mg/dL — ABNORMAL HIGH (ref 8–23)
CO2: 20 mmol/L — ABNORMAL LOW (ref 22–32)
Calcium: 7.2 mg/dL — ABNORMAL LOW (ref 8.9–10.3)
Chloride: 98 mmol/L (ref 98–111)
Creatinine, Ser: 2.05 mg/dL — ABNORMAL HIGH (ref 0.44–1.00)
GFR, Estimated: 26 mL/min — ABNORMAL LOW (ref >60)
Glucose, Bld: 127 mg/dL — ABNORMAL HIGH (ref 70–99)
Potassium: 4.2 mmol/L (ref 3.5–5.1)
Sodium: 135 mmol/L (ref 135–145)
Total Bilirubin: 1.2 mg/dL (ref 0.3–1.2)
Total Protein: 6 g/dL — ABNORMAL LOW (ref 6.5–8.1)

## 2023-05-16 LAB — GLUCOSE, CAPILLARY
Glucose-Capillary: 111 mg/dL — ABNORMAL HIGH (ref 70–99)
Glucose-Capillary: 111 mg/dL — ABNORMAL HIGH (ref 70–99)
Glucose-Capillary: 146 mg/dL — ABNORMAL HIGH (ref 70–99)
Glucose-Capillary: 159 mg/dL — ABNORMAL HIGH (ref 70–99)
Glucose-Capillary: 182 mg/dL — ABNORMAL HIGH (ref 70–99)
Glucose-Capillary: 182 mg/dL — ABNORMAL HIGH (ref 70–99)
Glucose-Capillary: 90 mg/dL (ref 70–99)

## 2023-05-16 LAB — COOXEMETRY PANEL
Carboxyhemoglobin: 2.2 % — ABNORMAL HIGH (ref 0.5–1.5)
Methemoglobin: 0.7 % (ref 0.0–1.5)
O2 Saturation: 76.1 %
Total hemoglobin: 13.4 g/dL (ref 12.0–16.0)
Total oxygen content: 73.9 %

## 2023-05-16 LAB — MAGNESIUM: Magnesium: 2.1 mg/dL (ref 1.7–2.4)

## 2023-05-16 MED ORDER — FREE WATER
30.0000 mL | Status: DC
  Administered 2023-05-16 – 2023-05-18 (×12): 30 mL

## 2023-05-16 MED ORDER — POLYETHYLENE GLYCOL 3350 17 G PO PACK: 17.0000 g | PACK | Freq: Every day | ORAL | Status: DC | PRN

## 2023-05-16 MED ORDER — METOLAZONE 2.5 MG PO TABS
2.5000 mg | ORAL_TABLET | Freq: Once | ORAL | Status: AC
  Administered 2023-05-16: 2.5 mg via ORAL
  Filled 2023-05-16: qty 1

## 2023-05-16 MED ORDER — VITAL AF 1.2 CAL PO LIQD
1000.0000 mL | ORAL | Status: DC
  Administered 2023-05-16: 1000 mL

## 2023-05-16 MED ORDER — PANTOPRAZOLE SODIUM 40 MG IV SOLR
40.0000 mg | Freq: Every day | INTRAVENOUS | Status: DC
  Administered 2023-05-16 – 2023-05-23 (×8): 40 mg via INTRAVENOUS
  Filled 2023-05-16 (×8): qty 10

## 2023-05-16 MED ORDER — APIXABAN 5 MG PO TABS
5.0000 mg | ORAL_TABLET | Freq: Two times a day (BID) | ORAL | Status: DC
  Administered 2023-05-16 – 2023-05-27 (×21): 5 mg via ORAL
  Filled 2023-05-16 (×22): qty 1

## 2023-05-16 MED ORDER — SENNOSIDES-DOCUSATE SODIUM 8.6-50 MG PO TABS
1.0000 | ORAL_TABLET | Freq: Every evening | ORAL | Status: DC | PRN
  Administered 2023-05-21: 1 via ORAL
  Filled 2023-05-16: qty 1

## 2023-05-16 NOTE — Progress Notes (Addendum)
[facility]  PROGRESS NOTE   Subjective:   Patient remains intubated on vent Vent 28% Sedation with Fentanyl and propofol Aroused to name Pressors: Levo Furosemide drip  Foley catheter- UOP  Objective:  Vital signs: Blood pressure (!) 112/39, pulse 79, temperature 99.1 F (37.3 C), temperature source Esophageal, resp. rate 15, height 5\' 2"  (1.575 m), weight 129.2 kg, SpO2 95 %.  Intake/Output Summary (Last 24 hours) at 05/16/2023 1104 Last data filed at 05/16/2023 1000 Gross per 24 hour  Intake 2447.9 ml  Output 4707 ml  Net -2259.1 ml    Filed Weights   05/14/23 1624 05/15/23 0500 05/16/23 0422  Weight: 128.6 kg 128.3 kg 129.2 kg     Physical Exam: General:  No acute distress  Head:  Normocephalic, atraumatic. Moist oral mucosal membranes  Eyes:  Anicteric  Lungs:  vent  Heart:  S1S2 no rubs  Abdomen:   Soft, nontender, bowel sounds present  Extremities: 2+ peripheral edema.  Neurologic: Light sedation   Skin:  No lesions  Access: None    Basic Metabolic Panel: Recent Labs  Lab 05/14/23 1030 05/14/23 1516 05/14/23 2155 05/15/23 0550 05/15/23 0551 05/16/23 0357  NA 127* 130* 129*  --  131* 135  K 6.2* 5.3* 5.4*  --  4.8 4.2  CL 90* 92* 95*  --  95* 98  CO2 22 21* 20*  --  22 20*  GLUCOSE 142* 122* 137*  --  115* 127*  BUN 142* 134* 127*  --  123* 107*  CREATININE 3.64* 3.42* 3.15*  --  2.75* 2.05*  CALCIUM 6.7* 7.1* 6.9*  --  7.1* 7.2*  MG  --   --   --  1.7  --  2.1    GFR: Estimated Creatinine Clearance: 33.4 mL/min (A) (by C-G formula based on SCr of 2.05 mg/dL (H)).  Liver Function Tests: Recent Labs  Lab 05/14/23 0202 05/14/23 1030 05/14/23 1516 05/16/23 0357  AST 13* 20 11* 12*  ALT 9 9 10 10   ALKPHOS 42 41 45 45  BILITOT 0.7 1.1 0.7 1.2  PROT 6.0* 5.8* 6.2* 6.0*  ALBUMIN 3.1* 2.9* 3.3* 2.9*    No results for input(s): "LIPASE", "AMYLASE" in the last 168 hours. No results for input(s): "AMMONIA" in the last  168 hours.  CBC: Recent Labs  Lab 05/13/23 2259 05/14/23 0202 05/15/23 0550 05/16/23 0357  WBC 6.0 6.7 7.9 9.2  NEUTROABS 4.9  --   --  7.6  HGB 9.6* 9.1* 8.9* 9.1*  HCT 29.7* 28.3* 27.1* 27.1*  MCV 92.2 93.1 88.3 88.6  PLT 207 205 190 191      HbA1C: Hgb A1c MFr Bld  Date/Time Value Ref Range Status  04/30/2023 05:11 AM 9.1 (H) 4.8 - 5.6 % Final    Comment:    (NOTE)         Prediabetes: 5.7 - 6.4         Diabetes: >6.4         Glycemic control for adults with diabetes: <7.0   05/24/2019 09:29 AM 10.4 (H) 4.6 - 6.5 % Final    Comment:    Glycemic Control Guidelines for People with Diabetes:Non Diabetic:  <6%Goal of Therapy: <7%Additional Action Suggested:  >8%     Urinalysis: Recent Labs    05/14/23 1430  COLORURINE YELLOW*  LABSPEC 1.013  PHURINE 5.0  GLUCOSEU 50*  HGBUR SMALL*  BILIRUBINUR NEGATIVE  KETONESUR NEGATIVE  PROTEINUR 30*  NITRITE NEGATIVE  LEUKOCYTESUR LARGE*  Imaging: DG Chest Port 1 View  Result Date: 05/14/2023 CLINICAL DATA:  Intubation. EXAM: PORTABLE CHEST 1 VIEW COMPARISON:  Chest radiograph dated 05/14/2023. FINDINGS: An endotracheal tube terminates in the midthoracic trachea. A left internal jugular central venous catheter tip overlies the superior vena cava. An enteric tube terminates in the stomach. The heart is enlarged. Moderate bilateral interstitial and airspace opacities appear similar to prior exam. A left pleural effusion may contribute. There is no right pleural effusion. There is no pneumothorax. Degenerative changes are seen in the spine. IMPRESSION: Enteric tube terminates in the midthoracic trachea. Electronically Signed   By: Romona Curls M.D.   On: 05/14/2023 21:33   DG Chest Portable 1 View  Result Date: 05/14/2023 CLINICAL DATA:  post intubation EXAM: PORTABLE CHEST 1 VIEW COMPARISON:  May 14, 2023 FINDINGS: Low lung volume radiograph. The cardiomediastinal silhouette is unchanged and enlarged in contour.ETT  tip terminates approximately 2 cm above the carina. The enteric tube courses through the chest to the abdomen beyond the field-of-view. No large pleural effusion. No pneumothorax. Patchy bilateral airspace opacities and pulmonary vascular congestion, similar in comparison to prior. IMPRESSION: 1. Support apparatus as described above. 2. Similar appearance of patchy bilateral airspace opacities and pulmonary vascular congestion. Electronically Signed   By: Meda Klinefelter M.D.   On: 05/14/2023 14:59     Medications:    sodium chloride 50 mL/hr at 05/16/23 0900   fentaNYL infusion INTRAVENOUS Stopped (05/16/23 0805)   furosemide (LASIX) 200 mg in dextrose 5 % 100 mL (2 mg/mL) infusion 10 mg/hr (05/16/23 0900)   norepinephrine (LEVOPHED) Adult infusion 8 mcg/min (05/16/23 0900)   propofol (DIPRIVAN) infusion Stopped (05/16/23 0804)    apixaban  5 mg Oral BID   atorvastatin  40 mg Oral Daily   Chlorhexidine Gluconate Cloth  6 each Topical Daily   fluticasone furoate-vilanterol  1 puff Inhalation Daily   insulin aspart  0-6 Units Subcutaneous Q4H   mouth rinse  15 mL Mouth Rinse Q2H   pantoprazole (PROTONIX) IV  40 mg Intravenous Daily   sodium chloride flush  3 mL Intravenous Q12H    Assessment/ Plan:     67 year old female with a PMHx of morbid obesity, obstructive sleep apnea, bedbound status for 3 years, type 2 diabetes, hypertension, coronary artery disease, congestive heart failure, recent history of cardiac catheterization on 6//6/24.  In the ED she was found to be lethargic with a blood pressure 113/50 and elevated creatinine and potassium.  She was given insulin and glucose and also was placed on sodium bicarbonate drip.  The ED attempted to place a Foley catheter but was unsuccessful.  She has been on losartan, metformin and spironolactone.     Principal Problem:   AKI (acute kidney injury) (HCC) Active Problems:   Essential hypertension   Pulmonary embolism (HCC)   OSA  (obstructive sleep apnea)   Altered mental status   Hyperkalemia   Hyponatremia   Anemia   #1: Acute kidney injury: Patient probably has acute kidney injury for the last 1 week after the IV contrast.  The hyperkalemia can be secondary to losartan complicated by spironolactone.    Remains on Furosemide drip 10mg /hr. Creatinine continues to improve. Will continue to monitor during diuretic therapy.   #2: Hyperkalemia: Patient received doses of insulin along with glucose and sodium bicarbonate in ED.   Potassium corrected  #3: Hyponatremia: This is most likely secondary to dilutional causes with volume overloaded.    Sodium stable with diuresis  #  4: Vent failure/respiratory acidosis: This may be secondary to CO2 narcosis.  Patient on ventilator.  Further care as far critical care and pulmonary.   #5: Coronary artery disease/congestive heart failure/hypertension: Cardiology note appreciated.  Will continue furosemide drip with once time dose of Metolazone 2.5mg  dose today   #6: Anemia: Most likely secondary to iron deficiency/acute kidney injury. Hgb at goal     LOS: 2 [facility] 6/17/202411:04 AM

## 2023-05-16 NOTE — Consult Note (Signed)
Advanced Heart Failure Team Consult Note   Primary Physician: Pcp, No PCP-Cardiologist:  Julien Nordmann, MD  Reason for Consultation: ADHF  HPI:    Sonya Sanchez is seen today for evaluation of ADHF at the request of Dr. Ladona Ridgel.   Sonya Sanchez is a 67 y/o woman with morbid obesity, HTN, chronic respiratory failure on 2L O2 at baseline, OSA on CPAP, bedbound (hasn't walked in 3 years), non obstructive CAD  Admitted 2 weeks ago wit hNSTEMI and underwent left heart cath by Dr. Okey Dupre. 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 She also has mod AS. Her LVEDP was elevated at 20  Admitted with progressive SOB. Developed severe hypercarbic resp failure with volume overload, hypotension and and AKI.  Echo 6/24 EF 65-65% G2DD. RV ok RVSP 44  Now intubated sedated. On NE 8.  Renal function imrpoving. Diuresing slowly on lasix gtt at 5.   Review of Systems: Unavailable    Home Medications Prior to Admission medications   Medication Sig Start Date End Date Taking? Authorizing Provider  acetaminophen (TYLENOL) 325 MG tablet Take 650 mg by mouth every 4 (four) hours as needed for mild pain, fever or headache.   Yes [provider]  atorvastatin (LIPITOR) 40 MG tablet Take 40 mg by mouth at bedtime.   Yes [provider]  azathioprine (IMURAN) 100 MG tablet Take 100 mg by mouth 2 (two) times daily.   Yes [provider]  Calcium Carb-Cholecalciferol (CALCIUM 600+D) 600-10 MG-MCG TABS Take 1 tablet by mouth daily.   Yes [provider]  Cholecalciferol (VITAMIN D) 1000 UNITS capsule Take 2,000 Units by mouth daily.   Yes [provider]  ciprofloxacin (CIPRO) 500 MG tablet Take 500 mg by mouth 2 (two) times daily. 05/13/23 05/20/23 Yes [provider]  ELIQUIS 5 MG TABS tablet TAKE 1 TABLET(5 MG) BY MOUTH TWICE DAILY 03/29/18  Yes Gollan, Tollie Pizza, MD   empagliflozin (JARDIANCE) 10 MG TABS tablet Take 10 mg by mouth daily.   Yes [provider]  fluticasone (FLONASE) 50 MCG/ACT nasal spray Place 2 sprays into both nostrils daily as needed for allergies or rhinitis. 02/21/19  Yes McLean-Scocuzza, Pasty Spillers, MD  Fluticasone-Umeclidin-Vilant 100-62.5-25 MCG/ACT AEPB Inhale 1 puff into the lungs daily.   Yes [provider]  furosemide (LASIX) 40 MG tablet Take 1 tablet (40 mg total) by mouth daily. 01/09/18  Yes Gollan, Tollie Pizza, MD  Insulin Glargine (BASAGLAR KWIKPEN) 100 UNIT/ML Inject 40 Units into the skin at bedtime.   Yes [provider]  insulin lispro (HUMALOG) 100 UNIT/ML injection Inject 6 Units into the skin 3 (three) times daily before meals.   Yes [provider]  loratadine (CLARITIN) 10 MG tablet Take 1 tablet (10 mg total) by mouth daily as needed for allergies. 02/21/19  Yes McLean-Scocuzza, Pasty Spillers, MD  losartan (COZAAR) 100 MG tablet Take 100 mg by mouth daily.   Yes [provider]  metoprolol succinate (TOPROL-XL) 25 MG 24 hr tablet Take 1 tablet (25 mg total) by mouth daily. 05/06/23 06/05/23 Yes Agbata, Tochukwu, MD  Multiple Vitamin (MULTIVITAMIN) tablet Take 1 tablet by mouth daily.   Yes [provider]  omeprazole (PRILOSEC) 20 MG capsule Take 20 mg by mouth daily.   Yes [provider]  pregabalin (LYRICA) 200 MG capsule Take 200 mg by mouth 3 (three) times  daily.   Yes [provider]  Semaglutide, 2 MG/DOSE, (OZEMPIC, 2 MG/DOSE,) 8 MG/3ML SOPN Inject 0.25 mg once weekly for 4 week and increase to 0.5 mg weekly and follow up with PCP. 05/05/23  Yes Agbata, Tochukwu, MD  spironolactone (ALDACTONE) 25 MG tablet Take 25 mg by mouth daily.   Yes [provider]  insulin aspart (NOVOLOG) 100 UNIT/ML injection Inject 4 Units into the skin 3 (three) times daily with meals. Patient not taking: Reported on 05/14/2023 05/05/23   Lucile Shutters, MD  irbesartan  (AVAPRO) 300 MG tablet Take 1 tablet (300 mg total) by mouth daily. Patient not taking: Reported on 05/14/2023 05/06/23 06/05/23  Lucile Shutters, MD    Past Medical History: Past Medical History:  Diagnosis Date   Calculus of kidney    Diabetes mellitus without complication (HCC)    DVT (deep venous thrombosis) (HCC)    a. on Eliquis   Family history of early CAD    a. parents passing in their 58's from CAD   Gout, unspecified    Heart murmur    Hyperlipidemia    Hypertension    Morbid obesity (HCC)    Normal cardiac stress test    a. equivocal study, sig soft tissue artifact present, no chest discomfort or ECG changes, perfusion images suggest mod sized region of mild reversible perfusion defect. Findings may be 2/2 shifting soft tissue attenuation, but cannot r/o ischemia, EF 72%   Phlebitis and thrombophlebitis of other deep vessels of lower extremities    Phlebitis and thrombophlebitis of other deep vessels of lower extremities    Pulmonary emboli (HCC)    a. on Eliquis   Spinal stenosis, unspecified region other than cervical    Tobacco abuse    Type II or unspecified type diabetes mellitus without mention of complication, uncontrolled    Unspecified sleep apnea    Urinary tract infection, site not specified     Past Surgical History: Past Surgical History:  Procedure Laterality Date   ABDOMINAL HYSTERECTOMY     hyperplasia of endometrium   ANKLE SURGERY     fracture s/p pin, right ankle   APPENDECTOMY     CHOLECYSTECTOMY     COLECTOMY     temporary colostomy, now reversed   INTESTINAL BYPASS     ovarian cyst ruptured, led to perforated intestine   LEFT HEART CATH AND CORONARY ANGIOGRAPHY N/A 05/04/2023   Procedure: LEFT HEART CATH AND CORONARY ANGIOGRAPHY;  Surgeon: Yvonne Kendall, MD;  Location: [facility] INVASIVE CV LAB;  Service: Cardiovascular;  Laterality: N/A;   STOMACH SURGERY      Family History: Family History  Problem Relation Age of Onset   Heart attack  Mother 73   Cancer Mother        breast   Heart attack Father 68   Arthritis Sister     Social History: Social History   Socioeconomic History   Marital status: Single    Spouse name: Not on file   Number of children: Not on file   Years of education: Not on file   Highest education level: Not on file  Occupational History   Not on file  Tobacco Use   Smoking status: Former    Packs/day: 1.00    Years: 10.00    Additional pack years: 0.00    Total pack years: 10.00    Types: Cigarettes    Quit date: 12/26/1999    Years since quitting: 23.4   Smokeless tobacco: Never  Substance and Sexual Activity   Alcohol use: Yes    Comment: socially   Drug use: No   Sexual activity: Not Currently  Other Topics Concern   Not on file  Social History Narrative   Lives in [place], [place] summer (near the [place]), [place] Dec-Mar.  Sister and children live here in [place]      Work - Retired Designer, television/film set      Diet - regular      Exercise - walks, limited by knee and ankle pain   Social Determinants of Corporate investment banker Strain: Not on file  Food Insecurity: No Food Insecurity (04/30/2023)   Hunger Vital Sign    Worried About Running Out of Food in the Last Year: Never true    Ran Out of Food in the Last Year: Never true  Transportation Needs: No Transportation Needs (04/30/2023)   PRAPARE - Administrator, Civil Service (Medical): No    Lack of Transportation (Non-Medical): No  Physical Activity: Not on file  Stress: Not on file  Social Connections: Not on file    Allergies:  Allergies  Allergen Reactions   Sulfa Antibiotics Other (See Comments)    Reaction: isn't certain, thinks she ran a fever, or had a rash, maybe both.    Objective:    Vital Signs:   Temp:  [99.1 F (37.3 C)-100.2 F (37.9 C)] 99.5 F (37.5 C) (06/17 0804) Pulse Rate:  [56-102] 62 (06/17 0804) Resp:  [10-25] 16 (06/17 0804) BP: (82-147)/(34-89) 110/36 (06/17 0800) SpO2:   [85 %-100 %] 95 % (06/17 0804) FiO2 (%):  [25 %-35 %] 28 % (06/17 0318) Weight:  [129.2 kg] 129.2 kg (06/17 0422) Last BM Date : 05/14/23  Weight change: Filed Weights   05/14/23 1624 05/15/23 0500 05/16/23 0422  Weight: 128.6 kg 128.3 kg 129.2 kg    Intake/Output:   Intake/Output Summary (Last 24 hours) at 05/16/2023 0832 Last data filed at 05/16/2023 0411 Gross per 24 hour  Intake 1916.23 ml  Output 4325 ml  Net -2408.77 ml      Physical Exam    General:  obese woman intubated sedated HEENT: normal + ETT Neck: supple. LIJ trialysis cath Carotids 2+ bilat; no bruits. No lymphadenopathy or thyromegaly appreciated. Cor: Regular rate & rhythm. 2/6 AS. Lungs: clear Abdomen: obese soft, nontender, nondistended. No hepatosplenomegaly. No bruits or masses. Good bowel sounds. Extremities: no cyanosis, clubbing, rash, 2+ edema Neuro: intubated sedated   Telemetry   Sinus 60-70s Personally reviewed   Labs   Basic Metabolic Panel: Recent Labs  Lab 05/14/23 1030 05/14/23 1516 05/14/23 2155 05/15/23 0550 05/15/23 0551 05/16/23 0357  NA 127* 130* 129*  --  131* 135  K 6.2* 5.3* 5.4*  --  4.8 4.2  CL 90* 92* 95*  --  95* 98  CO2 22 21* 20*  --  22 20*  GLUCOSE 142* 122* 137*  --  115* 127*  BUN 142* 134* 127*  --  123* 107*  CREATININE 3.64* 3.42* 3.15*  --  2.75* 2.05*  CALCIUM 6.7* 7.1* 6.9*  --  7.1* 7.2*  MG  --   --   --  1.7  --  2.1    Liver Function Tests: Recent Labs  Lab 05/14/23 0202 05/14/23 1030 05/14/23 1516 05/16/23 0357  AST 13* 20 11* 12*  ALT 9 9 10 10   ALKPHOS 42 41 45 45  BILITOT 0.7 1.1 0.7 1.2  PROT 6.0* 5.8* 6.2*  6.0*  ALBUMIN 3.1* 2.9* 3.3* 2.9*   No results for input(s): "LIPASE", "AMYLASE" in the last 168 hours. No results for input(s): "AMMONIA" in the last 168 hours.  CBC: Recent Labs  Lab 05/13/23 2259 05/14/23 0202 05/15/23 0550 05/16/23 0357  WBC 6.0 6.7 7.9 9.2  NEUTROABS 4.9  --   --  7.6  HGB 9.6* 9.1* 8.9* 9.1*   HCT 29.7* 28.3* 27.1* 27.1*  MCV 92.2 93.1 88.3 88.6  PLT 207 205 190 191    Cardiac Enzymes: Recent Labs  Lab 05/14/23 1525  CKTOTAL 59    BNP: BNP (last 3 results) Recent Labs    04/29/23 1507  BNP 386.8*    ProBNP (last 3 results) No results for input(s): "PROBNP" in the last 8760 hours.   CBG: Recent Labs  Lab 05/15/23 1537 05/15/23 1941 05/16/23 0004 05/16/23 0354 05/16/23 0717  GLUCAP 99 111* 111* 111* 90    Coagulation Studies: Recent Labs    05/14/23 0202  LABPROT 16.7*  INR 1.3*     Imaging   No results found.   Medications:     Current Medications:  atorvastatin  40 mg Oral Daily   Chlorhexidine Gluconate Cloth  6 each Topical Daily   fluticasone furoate-vilanterol  1 puff Inhalation Daily   insulin aspart  0-6 Units Subcutaneous Q4H   mouth rinse  15 mL Mouth Rinse Q2H   sodium chloride flush  3 mL Intravenous Q12H    Infusions:  sodium chloride 50 mL/hr at 05/16/23 0411   fentaNYL infusion INTRAVENOUS 150 mcg/hr (05/16/23 0411)   furosemide (LASIX) 200 mg in dextrose 5 % 100 mL (2 mg/mL) infusion 5 mg/hr (05/16/23 0411)   norepinephrine (LEVOPHED) Adult infusion 10 mcg/min (05/16/23 0411)   propofol (DIPRIVAN) infusion 10 mcg/kg/min (05/16/23 0625)      Assessment/Plan   1. Acute on chronic diastolic HF - Echo 05/01/23 EF 60-65% G2 DD mild AS. RV normal. RVSP 44 - remains volume overloaded. - increase lasix gtt to 10 and add metolazone 2.5 x 1  2. Acute hypoxic/hypercapneic respiratory failure due to OHS - wean vent as tolerated - CCM  following - diurese  3. AKI - likely due to ATN/shock - baseline Scr 1.1 - peak 3.64 -> 2.75  - improving - Wean NE as tolerated.  - Keep MAP >= 70 to preserve renal function  4. Morbid obesity - needs weight loss  5. CAD - non-obstructive on cath 2021 - resume ASA - continue statin  CRITICAL CARE Performed by: Arvilla Meres  Total critical care time: 45  minutes  Critical care time was exclusive of separately billable procedures and treating other patients.  Critical care was necessary to treat or prevent imminent or life-threatening deterioration.  Critical care was time spent personally by me (independent of midlevel providers or residents) on the following activities: development of treatment plan with patient and/or surrogate as well as nursing, discussions with consultants, evaluation of patient's response to treatment, examination of patient, obtaining history from patient or surrogate, ordering and performing treatments and interventions, ordering and review of laboratory studies, ordering and review of radiographic studies, pulse oximetry and re-evaluation of patient's condition.    Length of Stay: 2  Arvilla Meres, MD  05/16/2023, 8:32 AM  Advanced Heart Failure Team Pager [phone] (M-F; 7a - 5p)  Please contact [facility] for night-coverage after hours (4p -7a ) and weekends on [email or web address]

## 2023-05-16 NOTE — Progress Notes (Signed)
Initial Nutrition Assessment  DOCUMENTATION CODES:   Obesity unspecified  INTERVENTION:   Vital 1.2@60ml /hr- Initiate at 20ml/hr and increase by 77ml/hr q 8 hours until goal rate is reached.   Free water flushes 30ml q4 hours to maintain tube patency   Regimen provides 1728kcal/day, 108g/day protein and 1322ml/day of free water.   Pt at refeed risk; recommend monitor potassium, magnesium and phosphorus labs daily until stable  Daily weights   NUTRITION DIAGNOSIS:   Inadequate oral intake related to inability to eat (pt sedated and ventilated) as evidenced by NPO status.  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  Pt sedated and ventilated. OGT in place. Will plan to initiate tube feeds today. Per chart, pt is up ~23lbs from her UBW of 260lbs.    Medications reviewed and include: insulin, protonix, NaCl @50ml /hr, lasix, levophed   Labs reviewed: Na 135 wnl, K 4.2 wnl, BUN 107(H), creat 2.05(H), Mg 1.7 wnl Hgb 9.1(L), Hct 27.1(L) Cbgs- 146, 90, 111, 111 x 24 hrs  AIC 9.1(H)- 6/1  Patient is currently intubated on ventilator support MV: 7.2 L/min Temp (24hrs), Avg:99.6 F (37.6 C), Min:98.8 F (37.1 C), Max:100.2 F (37.9 C)  Propofol: holding   NUTRITION - FOCUSED PHYSICAL EXAM:  Flowsheet Row Most Recent Value  Orbital Region No depletion  Upper Arm Region No depletion  Thoracic and Lumbar Region No depletion  Buccal Region No depletion  Temple Region No depletion   Clavicle Bone Region No depletion  Clavicle and Acromion Bone Region No depletion  Scapular Bone Region No depletion  Dorsal Hand No depletion  Patellar Region No depletion  Anterior Thigh Region No depletion  Posterior Calf Region No depletion  Edema (RD Assessment) Moderate  Hair Reviewed  Eyes Reviewed  Mouth Reviewed  Skin Reviewed  Nails Reviewed   Diet Order:   Diet Order     None      EDUCATION NEEDS:   No education needs have been identified at this time  Skin:  Skin Assessment: Reviewed RN Assessment (MASD)  Last BM:  6/15- TYPE 6  Height:   Ht Readings from Last 1 Encounters:  05/13/23 5\' 2"  (1.575 m)    Weight:   Wt Readings from Last 1 Encounters:  05/16/23 129.2 kg    Ideal Body Weight:  50 kg  BMI:  Body mass index is 52.1 kg/m.  Estimated Nutritional Needs:   Kcal:  1305-1660kcal/day  Protein:  >100g/day  Fluid:  1.3-1.5L/day  Betsey Holiday MS, RD, LDN Please refer to [facility] for RD and/or RD on-call/weekend/after hours pager

## 2023-05-16 NOTE — Progress Notes (Addendum)
NAME:  Sonya Sanchez, MRN:  [id number], DOB:  [birth year], LOS: 2 ADMISSION DATE:  05/13/2023, CONSULTATION DATE:  05/14/2023 REFERRING MD:  EDP, CHIEF COMPLAINT:   Altered mental status, acute hypoxic, hypercapnic respiratory failure  History of Present Illness:   67 yo F with obesity, from nursing home. She has as history of PE, DVT, aortic stenosis, HTN, chronic bronchitis, OSA, AKI, dermatomyositis, DM2, dyslipidemia, came in due to worsening renal function noted on bloodwork. She was noted to be lethargic, denied pain anywhere. She was found to have severe hypercapnic acidemia. She was placed on BIPAP. PCCM was consulted to due AMS lethargy on BIPAP.   Pertinent  Medical History    has a past medical history of Calculus of kidney, Diabetes mellitus without complication (HCC), DVT (deep venous thrombosis) (HCC), Family history of early CAD, Gout, unspecified, Heart murmur, Hyperlipidemia, Hypertension, Morbid obesity (HCC), Normal cardiac stress test, Phlebitis and thrombophlebitis of other deep vessels of lower extremities, Phlebitis and thrombophlebitis of other deep vessels of lower extremities, Pulmonary emboli (HCC), Spinal stenosis, unspecified region other than cervical, Tobacco abuse, Type II or unspecified type diabetes mellitus without mention of complication, uncontrolled, Unspecified sleep apnea, and Urinary tract infection, site not specified.   Significant Hospital Events: Including procedures, antibiotic start and stop dates in addition to other pertinent events   05/15/23 central line placed, nephrology, cardiology consult  Interim History / Subjective:    Objective   Blood pressure (!) 110/36, pulse 62, temperature 99.5 F (37.5 C), resp. rate 16, height 5\' 2"  (1.575 m), weight 129.2 kg, SpO2 95 %.    Vent Mode: PSV FiO2 (%):  [25 %-35 %] 28 % Set Rate:  [16 bmp] 16 bmp Vt Set:  [450 mL] 450 mL PEEP:  [5 cmH20] 5 cmH20 Pressure Support:  [5 cmH20-10 cmH20] 5 cmH20 Plateau  Pressure:  [20 cmH20] 20 cmH20   Intake/Output Summary (Last 24 hours) at 05/16/2023 0912 Last data filed at 05/16/2023 0411 Gross per 24 hour  Intake 1916.23 ml  Output 4325 ml  Net -2408.77 ml   Filed Weights   05/14/23 1624 05/15/23 0500 05/16/23 0422  Weight: 128.6 kg 128.3 kg 129.2 kg    Examination: Gen:      No acute distress, obese HEENT:  EOMI, sclera anicteric Neck:     No masses; no thyromegaly, ETT Lungs:    Clear to auscultation bilaterally; normal respiratory effort CV:         Regular rate and rhythm; no murmurs Abd:      + bowel sounds; soft, non-tender; no palpable masses, no distension Ext:    2+ edema; adequate peripheral perfusion Skin:      Warm and dry; no rash Neuro: Awake, responsive  Labs/imaging reviewed Significant for BUN/creatinine 107/2.05 AST 12 WBC 9.2, hemoglobin 9.1, platelets 191 No new imaging  Resolved Hospital Problem list    Assessment & Plan:  Acute on chronic diastolic heart failure Will need additional diuresis if she is still volume overloaded Discussed with cardiology and Lasix drip increased.  Add metolazone Wean pressors Check CVP and Coox  AKI secondary to IV contrast, ATN Hyperkalemia-improved Creatinine is improving. Monitor urine output and creatinine  Acute on chronic hypoxic, hypercarbic respiratory failure secondary to CHF History of OSA, morbid obesity Continue vent support.  Pressure support weans as tolerated.  Dermatomyositis Monitor CK Continue azathioprine  H/O DVT, PE Restart Eliquis  Positive UA Will remove Foley.  There is no evidence of infection with low WBC  count and no fevers.  Diabetes mellitus SSI coverage  Best Practice (right click and "Reselect all SmartList Selections" daily)   Diet/type: tubefeeds DVT prophylaxis: DOAC GI prophylaxis: PPI Lines: Central line Foley:  Yes, and it is no longer needed Code Status:  full code Last date of multidisciplinary goals of care discussion  []   Critical care time:    The patient is critically ill with multiple organ system failure and requires high complexity decision making for assessment and support, frequent evaluation and titration of therapies, advanced monitoring, review of radiographic studies and interpretation of complex data.   Critical Care Time devoted to patient care services, exclusive of separately billable procedures, described in this note is 35 minutes.   Chilton Greathouse MD [facility] See Amion for pager  If no response to pager , please call [phone] until 7pm After 7:00 pm call Elink  [phone] 05/16/2023, 9:34 AM

## 2023-05-16 NOTE — Consult Note (Addendum)
PHARMACY CONSULT NOTE  Pharmacy Consult for Electrolyte Monitoring and Replacement   Recent Labs: Potassium (mmol/L)  Date Value  05/16/2023 4.2   Magnesium (mg/dL)  Date Value  16/08/9603 2.1   Calcium (mg/dL)  Date Value  54/07/8118 7.2 (L)   Albumin (g/dL)  Date Value  14/78/2956 2.9 (L)  01/09/2018 4.0   Sodium (mmol/L)  Date Value  05/16/2023 135  01/09/2018 138   Assessment: Patient is a 67 y/o F with medical history including morbid obesity, limited functional capacity, nursing home resident admitted with altered mentation, AKI, acute CHF. Pharmacy consulted to assist with electrolyte monitoring and replacement as indicated.  Diuresis: IV Lasix at 10 mg/hr  Goal of Therapy:  Electrolytes within normal limits  Plan:  --No replacement currently indicated --Follow-up electrolytes with AM labs tomorrow  Bettey Costa 05/16/2023 7:40 AM

## 2023-05-17 ENCOUNTER — Inpatient Hospital Stay: Payer: Medicare Other

## 2023-05-17 DIAGNOSIS — I5033 Acute on chronic diastolic (congestive) heart failure: Secondary | ICD-10-CM | POA: Diagnosis not present

## 2023-05-17 DIAGNOSIS — J9622 Acute and chronic respiratory failure with hypercapnia: Secondary | ICD-10-CM

## 2023-05-17 DIAGNOSIS — N179 Acute kidney failure, unspecified: Secondary | ICD-10-CM | POA: Diagnosis not present

## 2023-05-17 DIAGNOSIS — J9621 Acute and chronic respiratory failure with hypoxia: Secondary | ICD-10-CM | POA: Diagnosis not present

## 2023-05-17 LAB — URINALYSIS, COMPLETE (UACMP) WITH MICROSCOPIC
Bilirubin Urine: NEGATIVE
Glucose, UA: 150 mg/dL — AB
Ketones, ur: NEGATIVE mg/dL
Nitrite: NEGATIVE
Protein, ur: 30 mg/dL — AB
Specific Gravity, Urine: 1.009 (ref 1.005–1.030)
pH: 5 (ref 5.0–8.0)

## 2023-05-17 LAB — COMPREHENSIVE METABOLIC PANEL
ALT: 8 U/L (ref 0–44)
AST: 11 U/L — ABNORMAL LOW (ref 15–41)
Albumin: 2.9 g/dL — ABNORMAL LOW (ref 3.5–5.0)
Alkaline Phosphatase: 49 U/L (ref 38–126)
Anion gap: 19 — ABNORMAL HIGH (ref 5–15)
BUN: 100 mg/dL — ABNORMAL HIGH (ref 8–23)
CO2: 19 mmol/L — ABNORMAL LOW (ref 22–32)
Calcium: 7.8 mg/dL — ABNORMAL LOW (ref 8.9–10.3)
Chloride: 96 mmol/L — ABNORMAL LOW (ref 98–111)
Creatinine, Ser: 1.98 mg/dL — ABNORMAL HIGH (ref 0.44–1.00)
GFR, Estimated: 27 mL/min — ABNORMAL LOW (ref >60)
Glucose, Bld: 223 mg/dL — ABNORMAL HIGH (ref 70–99)
Potassium: 4.1 mmol/L (ref 3.5–5.1)
Sodium: 134 mmol/L — ABNORMAL LOW (ref 135–145)
Total Bilirubin: 1.3 mg/dL — ABNORMAL HIGH (ref 0.3–1.2)
Total Protein: 6.3 g/dL — ABNORMAL LOW (ref 6.5–8.1)

## 2023-05-17 LAB — GLUCOSE, CAPILLARY
Glucose-Capillary: 184 mg/dL — ABNORMAL HIGH (ref 70–99)
Glucose-Capillary: 231 mg/dL — ABNORMAL HIGH (ref 70–99)
Glucose-Capillary: 260 mg/dL — ABNORMAL HIGH (ref 70–99)
Glucose-Capillary: 260 mg/dL — ABNORMAL HIGH (ref 70–99)
Glucose-Capillary: 278 mg/dL — ABNORMAL HIGH (ref 70–99)
Glucose-Capillary: 280 mg/dL — ABNORMAL HIGH (ref 70–99)
Glucose-Capillary: 297 mg/dL — ABNORMAL HIGH (ref 70–99)

## 2023-05-17 LAB — CBC
HCT: 29.7 % — ABNORMAL LOW (ref 36.0–46.0)
Hemoglobin: 10.2 g/dL — ABNORMAL LOW (ref 12.0–15.0)
MCH: 29.7 pg (ref 26.0–34.0)
MCHC: 34.3 g/dL (ref 30.0–36.0)
MCV: 86.3 fL (ref 80.0–100.0)
Platelets: 192 10*3/uL (ref 150–400)
RBC: 3.44 MIL/uL — ABNORMAL LOW (ref 3.87–5.11)
RDW: 19.2 % — ABNORMAL HIGH (ref 11.5–15.5)
WBC: 14.2 10*3/uL — ABNORMAL HIGH (ref 4.0–10.5)
nRBC: 0 % (ref 0.0–0.2)

## 2023-05-17 LAB — PHOSPHORUS: Phosphorus: 6.6 mg/dL — ABNORMAL HIGH (ref 2.5–4.6)

## 2023-05-17 LAB — PROCALCITONIN: Procalcitonin: 8.69 ng/mL

## 2023-05-17 LAB — MAGNESIUM: Magnesium: 1.8 mg/dL (ref 1.7–2.4)

## 2023-05-17 LAB — LACTIC ACID, PLASMA: Lactic Acid, Venous: 1 mmol/L (ref 0.5–1.9)

## 2023-05-17 MED ORDER — VANCOMYCIN HCL 1500 MG/300ML IV SOLN
1500.0000 mg | INTRAVENOUS | Status: DC
  Administered 2023-05-19 – 2023-05-23 (×3): 1500 mg via INTRAVENOUS
  Filled 2023-05-17 (×3): qty 300

## 2023-05-17 MED ORDER — INSULIN DETEMIR 100 UNIT/ML ~~LOC~~ SOLN
5.0000 [IU] | Freq: Every day | SUBCUTANEOUS | Status: DC
  Administered 2023-05-17: 5 [IU] via SUBCUTANEOUS
  Filled 2023-05-17: qty 0.05

## 2023-05-17 MED ORDER — SODIUM CHLORIDE 0.9 % IV SOLN
2.0000 g | INTRAVENOUS | Status: DC
  Administered 2023-05-17: 2 g via INTRAVENOUS
  Filled 2023-05-17 (×2): qty 12.5

## 2023-05-17 MED ORDER — VANCOMYCIN HCL 2000 MG/400ML IV SOLN
2000.0000 mg | Freq: Once | INTRAVENOUS | Status: AC
  Administered 2023-05-17: 2000 mg via INTRAVENOUS
  Filled 2023-05-17: qty 400

## 2023-05-17 MED ORDER — INSULIN ASPART 100 UNIT/ML IJ SOLN
0.0000 [IU] | INTRAMUSCULAR | Status: DC
  Administered 2023-05-17 – 2023-05-18 (×3): 5 [IU] via SUBCUTANEOUS
  Filled 2023-05-17 (×3): qty 1

## 2023-05-17 MED ORDER — DOCUSATE SODIUM 50 MG/5ML PO LIQD
50.0000 mg | Freq: Two times a day (BID) | ORAL | Status: DC
  Administered 2023-05-17 – 2023-05-18 (×3): 50 mg
  Filled 2023-05-17 (×3): qty 10

## 2023-05-17 MED ORDER — POLYETHYLENE GLYCOL 3350 17 G PO PACK
17.0000 g | PACK | Freq: Every day | ORAL | Status: DC
  Administered 2023-05-17 – 2023-05-21 (×3): 17 g
  Filled 2023-05-17 (×4): qty 1

## 2023-05-17 NOTE — Consult Note (Signed)
Pharmacy Antibiotic Note  Sonya Sanchez is a 67 y.o. female admitted on 05/13/2023 from nursing home with worsening renal function on blood work. She was noted to be lethargic, denied pain anywhere. She was found to have severe hypercapnic acidemia. She was placed on BIPAP. PCCM was consulted to due AMS lethargy on BIPAP. Pattient's white count continues to increase with possible HCAP vs urosepsis.  Both blood and urine cultures were obtained. Pharmacy has been consulted for Vancomycin and Cefepime dosing.  Patient's initial baseline serum creatinine prior to AKI developing was near 1. Currently receiving lasix gtt@10mg /hr. Scr continues to improve and potential that new baseline is~2. Will dose off of AUC currently but continue to monitor renal function daily.  Plan: Vancomycin 2000mg  x 1 as loading dose, followed by 1500 mg IV Q 48 hrs. Goal AUC 400-550. Expected AUC: 543.2 SCr used: 1.98  2) Cefepime 2g IV Q24 horus   Height: 5\' 2"  (157.5 cm) Weight: 125.5 kg (276 lb 10.8 oz) IBW/kg (Calculated) : 50.1  Temp (24hrs), Avg:98.8 F (37.1 C), Min:98.3 F (36.8 C), Max:100 F (37.8 C)  Recent Labs  Lab 05/13/23 2259 05/14/23 0202 05/14/23 1030 05/14/23 1516 05/14/23 2155 05/15/23 0550 05/15/23 0551 05/16/23 0357 05/17/23 0428 05/17/23 0755  WBC 6.0 6.7  --   --   --  7.9  --  9.2 14.2*  --   CREATININE 3.64* 3.66*   < > 3.42* 3.15*  --  2.75* 2.05* 1.98*  --   LATICACIDVEN  --   --   --   --   --   --   --   --   --  1.0   < > = values in this interval not displayed.    Estimated Creatinine Clearance: 34 mL/min (A) (by C-G formula based on SCr of 1.98 mg/dL (H)).    Allergies  Allergen Reactions   Sulfa Antibiotics Other (See Comments)    Reaction: isn't certain, thinks she ran a fever, or had a rash, maybe both.    Antimicrobials this admission: Vancomycin 6/18 >>  Cefepime 6/18 >>   Dose adjustments this admission: N/A  Microbiology results: 6/18 BCx:  collected 6/18 UCx: collected  6/18 Trach cx: collected  6/15 MRSA PCR: positive 6/15 Resp cx: NG  Thank you for allowing pharmacy to be a part of this patient's care.  Sonya Sanchez 05/17/2023 12:22 PM

## 2023-05-17 NOTE — Procedures (Signed)
Endotracheal Intubation: Patient required placement of an artificial airway secondary to Respiratory Failure  Consent: Emergent.   Hand washing performed prior to starting the procedure.   Medications administered for sedation prior to procedure:  Rocuronium 40 mg IV, Fentanyl 150 mcg IV.    A time out procedure was called and correct patient, name, & ID confirmed. Needed supplies and equipment were assembled and checked to include ETT, 10 ml syringe, Glidescope, Mac and Miller blades, suction, oxygen and bag mask valve, end tidal CO2 monitor.   Patient was positioned to align the mouth and pharynx to facilitate visualization of the glottis.   Heart rate, SpO2 and blood pressure was continuously monitored during the procedure. Pre-oxygenation was conducted prior to intubation and endotracheal tube was placed through the vocal cords into the trachea.     The artificial airway was placed under direct visualization via glidescope route using a 7.5 ETT on the first attempt.  ETT was secured at 23 cm mark.  Placement was confirmed by auscuitation of lungs with good breath sounds bilaterally and no stomach sounds.  Condensation was noted on endotracheal tube.   Pulse ox 98%.  CO2 detector in place with appropriate color change.   Complications: None .    Vida Rigger, M.D.  Pulmonary & Critical Care Medicine  [facility]

## 2023-05-17 NOTE — Progress Notes (Signed)
Rounding Note    Patient Name: Sonya Sanchez Date of Encounter: 05/17/2023  [facility] Cardiologist: Julien Nordmann, MD   Subjective   Remains intubated and sedated.  Increased ETT secretions noted overnight.  Inpatient Medications    Scheduled Meds:  apixaban  5 mg Oral BID   atorvastatin  40 mg Oral Daily   Chlorhexidine Gluconate Cloth  6 each Topical Daily   fluticasone furoate-vilanterol  1 puff Inhalation Daily   free water  30 mL Per Tube Q4H   insulin aspart  0-6 Units Subcutaneous Q4H   mouth rinse  15 mL Mouth Rinse Q2H   pantoprazole (PROTONIX) IV  40 mg Intravenous Daily   sodium chloride flush  3 mL Intravenous Q12H   Continuous Infusions:  sodium chloride 50 mL/hr at 05/17/23 0600   ceFEPime (MAXIPIME) IV 2 g (05/17/23 0824)   feeding supplement (VITAL AF 1.2 CAL) 50 mL/hr at 05/17/23 0838   furosemide (LASIX) 200 mg in dextrose 5 % 100 mL (2 mg/mL) infusion 10 mg/hr (05/17/23 0600)   norepinephrine (LEVOPHED) Adult infusion 14 mcg/min (05/17/23 0600)   propofol (DIPRIVAN) infusion 20 mcg/kg/min (05/17/23 0818)   vancomycin     PRN Meds: acetaminophen **OR** acetaminophen, mouth rinse, polyethylene glycol, senna-docusate   Vital Signs    Vitals:   05/17/23 0500 05/17/23 0600 05/17/23 0815 05/17/23 0819  BP: (!) 115/35 (!) 107/46 98/79   Pulse: 87 79 73 (!) 25  Resp: 17 16 16 18   Temp:      TempSrc:      SpO2: 95% 94% 94% 94%  Weight: 125.5 kg     Height:        Intake/Output Summary (Last 24 hours) at 05/17/2023 0925 Last data filed at 05/17/2023 0829 Gross per 24 hour  Intake 3037.15 ml  Output 1630 ml  Net 1407.15 ml      05/17/2023    5:00 AM 05/16/2023    4:22 AM 05/15/2023    5:00 AM  Last 3 Weights  Weight (lbs) 276 lb 10.8 oz 284 lb 13.4 oz 282 lb 13.6 oz  Weight (kg) 125.5 kg 129.2 kg 128.3 kg      Telemetry    Sinus rhythm with PVC's - Personally Reviewed  ECG    No new tracing  Physical Exam   GEN: Intubated  and sedated. Neck: Unable to assess JVP due to body habitus/positioning/support devices Cardiac: RRR with 2/6 systolic murmur Respiratory: Diminished breath sounds anteriorly. GI: Obese but soft. MS: 1+ pretibial edema bilaterally; No deformity. Neuro:  Intubated and sedated  Psych: Intubated and sedated  Labs    High Sensitivity Troponin:   Recent Labs  Lab 04/30/23 1300 04/30/23 1437 05/01/23 0401 05/01/23 0908 05/01/23 1221  TROPONINIHS 1,678* 1,669* 958* 904* 859*     Chemistry Recent Labs  Lab 05/14/23 1516 05/14/23 2155 05/15/23 0550 05/15/23 0551 05/16/23 0357 05/17/23 0428  NA 130*   < >  --  131* 135 134*  K 5.3*   < >  --  4.8 4.2 4.1  CL 92*   < >  --  95* 98 96*  CO2 21*   < >  --  22 20* 19*  GLUCOSE 122*   < >  --  115* 127* 223*  BUN 134*   < >  --  123* 107* 100*  CREATININE 3.42*   < >  --  2.75* 2.05* 1.98*  CALCIUM 7.1*   < >  --  7.1* 7.2* 7.8*  MG  --   --  1.7  --  2.1 1.8  PROT 6.2*  --   --   --  6.0* 6.3*  ALBUMIN 3.3*  --   --   --  2.9* 2.9*  AST 11*  --   --   --  12* 11*  ALT 10  --   --   --  10 8  ALKPHOS 45  --   --   --  45 49  BILITOT 0.7  --   --   --  1.2 1.3*  GFRNONAA 14*   < >  --  18* 26* 27*  ANIONGAP 17*   < >  --  14 17* 19*   < > = values in this interval not displayed.    Lipids  Recent Labs  Lab 05/15/23 0550  TRIG 126    Hematology Recent Labs  Lab 05/15/23 0550 05/16/23 0357 05/17/23 0428  WBC 7.9 9.2 14.2*  RBC 3.07* 3.06* 3.44*  HGB 8.9* 9.1* 10.2*  HCT 27.1* 27.1* 29.7*  MCV 88.3 88.6 86.3  MCH 29.0 29.7 29.7  MCHC 32.8 33.6 34.3  RDW 18.8* 19.4* 19.2*  PLT 190 191 192   Thyroid  Recent Labs  Lab 05/15/23 0644  TSH 5.290*    BNPNo results for input(s): "BNP", "PROBNP" in the last 168 hours.  DDimer No results for input(s): "DDIMER" in the last 168 hours.   Radiology    No results found.  Cardiac Studies   LHC (05/04/2023): Severe single-vessel coronary artery disease with chronic  total occlusion of distal RCA with PDA and PL branches filling via left-to-right collaterals.  There is 70-80% stenosis of small-moderate first diagonal branch that is not well-suited to PCI. Mildly elevated left ventricular filling pressure (LVEDP 20 mmHg). Moderate aortic valve stenosis (mean gradient 24 mmHg). Small/stenotic right brachial veins and absent cephalic/basilic veins, not suitable for right heart catheterization.  TTE (05/01/2023):  1. Left ventricular ejection fraction, by estimation, is 60 to 65%. The  left ventricle has normal function. The left ventricle has no regional  wall motion abnormalities. There is mild concentric left ventricular  hypertrophy. Left ventricular diastolic  parameters are consistent with Grade II diastolic dysfunction  (pseudonormalization). Elevated left ventricular Seth Higginbotham-diastolic pressure.   2. Right ventricular systolic function is normal. The right ventricular  size is normal. There is mildly elevated pulmonary artery systolic  pressure.   3. Left atrial size was moderately dilated.   4. The mitral valve is degenerative. No evidence of mitral valve  regurgitation. Mild mitral stenosis. The mean mitral valve gradient is 7.0  mmHg with average heart rate of 73 bpm. Moderate mitral annular  calcification.   5. The aortic valve is tricuspid. There is mild calcification of the  aortic valve. There is mild thickening of the aortic valve. Aortic valve  regurgitation is not visualized. Mild aortic valve stenosis. Aortic valve  area, by VTI measures 1.08 cm.  Aortic valve mean gradient measures 10.0 mmHg. Aortic valve Vmax measures  2.13 m/s.   6. The inferior vena cava is dilated in size with <50% respiratory  variability, suggesting right atrial pressure of 15 mmHg.   Patient Profile     67 y.o. female coronary artery disease with CTO of distal RCA, HFpEF, mild-moderate aortic stenosis, and dermatomyositis, whom we are seeing for acute on chronic  HFpEF complicated by acute on chronic respiratory failure requiring intubation.  Assessment & Plan  Acute on chronic HFpEF: Patient still appears volume overloaded with peripheral edema.  She was net positive 1.7 L yesterday.  Renal function essentially unchanged.  CVP normal this morning. -Maintain net even fluid balance; target CVP ~10 mmHg.  Given patient was net positive yesterday with furosemide infusion at 10 mg/hr and metolazine yesterday. -Wean down norepinephrine as tolerated to minimize afterload.  Shock: -Continue NE titration per CCM.  Acute on chronic respiratory failure with hypoxia and hypercapnia: -Wean vent as tolerated per CCM. -Sputum cx with increased secretion. -Maintain net even fluid balance. -ABX per CCM.  AKI: Likely due to shock/ATN.  Creatinine stable/slightly improved today. -Titrate NE per CCM to augment renal perfusion. -Maintain net even fluid balance today. -Avoid nephrotoxic drugs.  CAD: Known CTO. -Continue secondary prevention.  CRITICAL CARE Performed by: Yvonne Kendall, MD  Total critical care time: 40 minutes  Critical care time was exclusive of separately billable procedures and treating other patients.  Critical care was necessary to treat or prevent imminent or life-threatening deterioration.  Critical care was time spent personally by me (independent of midlevel providers or residents) on the following activities: development of treatment plan with patient and/or surrogate as well as nursing, discussions with consultants, evaluation of patient's response to treatment, examination of patient, obtaining history from patient or surrogate, ordering and performing treatments and interventions, ordering and review of laboratory studies, ordering and review of radiographic studies, pulse oximetry and re-evaluation of patient's condition.   For questions or updates, please contact [facility] Please consult [email or web address] for  contact info under [facility].     Signed, Yvonne Kendall, MD  05/17/2023, 9:25 AM

## 2023-05-17 NOTE — Progress Notes (Signed)
NAME:  Sonya Sanchez, MRN:  [id number], DOB:  [birth year], LOS: 3 ADMISSION DATE:  05/13/2023, CONSULTATION DATE:  05/14/2023 REFERRING MD:  EDP, CHIEF COMPLAINT:   Altered mental status, acute hypoxic, hypercapnic respiratory failure  History of Present Illness:   67 yo F with obesity, from nursing home. She has as history of PE, DVT, aortic stenosis, HTN, chronic bronchitis, OSA, AKI, dermatomyositis, DM2, dyslipidemia, came in due to worsening renal function noted on bloodwork. She was noted to be lethargic, denied pain anywhere. She was found to have severe hypercapnic acidemia. She was placed on BIPAP. PCCM was consulted to due AMS lethargy on BIPAP.   Pertinent  Medical History    has a past medical history of Calculus of kidney, Diabetes mellitus without complication (HCC), DVT (deep venous thrombosis) (HCC), Family history of early CAD, Gout, unspecified, Heart murmur, Hyperlipidemia, Hypertension, Morbid obesity (HCC), Normal cardiac stress test, Phlebitis and thrombophlebitis of other deep vessels of lower extremities, Phlebitis and thrombophlebitis of other deep vessels of lower extremities, Pulmonary emboli (HCC), Spinal stenosis, unspecified region other than cervical, Tobacco abuse, Type II or unspecified type diabetes mellitus without mention of complication, uncontrolled, Unspecified sleep apnea, and Urinary tract infection, site not specified.   Significant Hospital Events: Including procedures, antibiotic start and stop dates in addition to other pertinent events   05/15/23 central line placed, nephrology, cardiology consult 05/19/2023-Lasix drip increased, metolazone added  Interim History / Subjective:   Remains on Lasix drip, I's/O+ Levophed requirements are going up with higher WBC count  Objective   Blood pressure (!) 107/46, pulse 79, temperature 100 F (37.8 C), temperature source Axillary, resp. rate 16, height 5\' 2"  (1.575 m), weight 125.5 kg, SpO2 94 %.    Vent Mode:  PRVC FiO2 (%):  [28 %] 28 % Set Rate:  [16 bmp] 16 bmp Vt Set:  [450 mL] 450 mL PEEP:  [5 cmH20] 5 cmH20 Pressure Support:  [5 cmH20] 5 cmH20 Plateau Pressure:  [18 cmH20] 18 cmH20   Intake/Output Summary (Last 24 hours) at 05/17/2023 0735 Last data filed at 05/17/2023 0600 Gross per 24 hour  Intake 3538.82 ml  Output 1850 ml  Net 1688.82 ml   Filed Weights   05/15/23 0500 05/16/23 0422 05/17/23 0500  Weight: 128.3 kg 129.2 kg 125.5 kg    Examination: Gen:      No acute distress, obese HEENT:  EOMI, sclera anicteric Neck:     No masses; no thyromegaly, ETT Lungs:    Clear to auscultation bilaterally; normal respiratory effort CV:         Regular rate and rhythm; no murmurs Abd:      + bowel sounds; soft, non-tender; no palpable masses, no distension Ext:    2+ edema; adequate peripheral perfusion Skin:      Warm and dry; no rash Neuro: Awake, responsive  Labs/imaging reviewed Significant for sodium 134, BUN/creatinine 100/1.98 AST 11, ALT 8 WBC 14.2, hemoglobin 10.2, platelets 192  Resolved Hospital Problem list    Assessment & Plan:  Acute on chronic diastolic heart failure Continue diuresis with Lasix and metolazone Check CVP Discussed with cardiology  AKI secondary to IV contrast, ATN Hyperkalemia-improved Creatinine is improving. Monitor urine output and creatinine  Sepsis.  Possible sources are hospital-acquired pneumonia versus urosepsis WBC count is elevated today Start empiric coverage with Vanco, cefepime Check lactic acid and PCT Panculture, chest x-ray  Acute on chronic hypoxic, hypercarbic respiratory failure secondary to CHF History of OSA, morbid obesity Continue vent  support.  Pressure support weans as tolerated.  Dermatomyositis Monitor CK Continue azathioprine  H/O DVT, PE Restart Eliquis  Diabetes mellitus SSI coverage  Best Practice (right click and "Reselect all SmartList Selections" daily)   Diet/type: tubefeeds DVT  prophylaxis: DOAC GI prophylaxis: PPI Lines: Central line Foley:  Yes, and it is no longer needed Code Status:  full code Last date of multidisciplinary goals of care discussion []   Critical care time:    The patient is critically ill with multiple organ system failure and requires high complexity decision making for assessment and support, frequent evaluation and titration of therapies, advanced monitoring, review of radiographic studies and interpretation of complex data.   Critical Care Time devoted to patient care services, exclusive of separately billable procedures, described in this note is 35 minutes.   Chilton Greathouse MD [facility] See Amion for pager  If no response to pager , please call [phone] until 7pm After 7:00 pm call Elink  [phone] 05/17/2023, 7:35 AM

## 2023-05-17 NOTE — Inpatient Diabetes Management (Signed)
Inpatient Diabetes Program Recommendations  AACE/ADA: New Consensus Statement on Inpatient Glycemic Control   Target Ranges:  Prepandial:   less than 140 mg/dL      Peak postprandial:   less than 180 mg/dL (1-2 hours)      Critically ill patients:  140 - 180 mg/dL    Latest Reference Range & Units 05/17/23 03:45 05/17/23 07:18  Glucose-Capillary 70 - 99 mg/dL 161 (H) 096 (H)    Latest Reference Range & Units 05/16/23 07:17 05/16/23 11:27 05/16/23 15:49 05/16/23 19:27 05/16/23 23:25  Glucose-Capillary 70 - 99 mg/dL 90 045 (H) 409 (H) 811 (H) 182 (H)   Review of Glycemic Control  Diabetes history: DM2 Outpatient Diabetes medications: Basaglar 40 units at bedtime, Humalog 6 units TID with meals, Jardiance 10 mg daily, Ozempic 0.25 mg Qweek Current orders for Inpatient glycemic control: Novolog 0-6 units Q4H; Vital @ 60 ml/hr  Inpatient Diabetes Program Recommendations:    Insulin: Please consider increasing Novolog to 0-9 units Q4H.  If CBGs are consistently over 180 mg/dl with increase in Novolog correction scale, may need to consider ordering Novolog tube feeding coverage Q4H.  Thanks, Orlando Penner, RN, MSN, CDCES Diabetes Coordinator Inpatient Diabetes Program [phone] (Team Pager from 8am to 5pm)

## 2023-05-17 NOTE — Progress Notes (Signed)
[facility]  PROGRESS NOTE   Subjective:   Patient intubated on vent Vent 28% Sedation with propofol Pressors: Levo Tube feeds: 68ml/hr Furosemide drip at 10mg /hr  Foley catheter- UOP  Objective:  Vital signs: Blood pressure (!) 131/30, pulse 71, temperature 99.7 F (37.6 C), temperature source Oral, resp. rate 16, height 5\' 2"  (1.575 m), weight 125.5 kg, SpO2 95 %.  Intake/Output Summary (Last 24 hours) at 05/17/2023 1131 Last data filed at 05/17/2023 1006 Gross per 24 hour  Intake 3985.51 ml  Output 1630 ml  Net 2355.51 ml    Filed Weights   05/15/23 0500 05/16/23 0422 05/17/23 0500  Weight: 128.3 kg 129.2 kg 125.5 kg     Physical Exam: General:  No acute distress  Head:  Normocephalic, atraumatic. Moist oral mucosal membranes  Eyes:  Anicteric  Lungs:  vent  Heart:  S1S2 no rubs  Abdomen:   Soft, nontender, bowel sounds present  Extremities: 2+ peripheral edema.  Neurologic: Light sedation   Skin:  No lesions  Access: None    Basic Metabolic Panel: Recent Labs  Lab 05/14/23 1516 05/14/23 2155 05/15/23 0550 05/15/23 0551 05/16/23 0357 05/17/23 0428  NA 130* 129*  --  131* 135 134*  K 5.3* 5.4*  --  4.8 4.2 4.1  CL 92* 95*  --  95* 98 96*  CO2 21* 20*  --  22 20* 19*  GLUCOSE 122* 137*  --  115* 127* 223*  BUN 134* 127*  --  123* 107* 100*  CREATININE 3.42* 3.15*  --  2.75* 2.05* 1.98*  CALCIUM 7.1* 6.9*  --  7.1* 7.2* 7.8*  MG  --   --  1.7  --  2.1 1.8  PHOS  --   --   --   --   --  6.6*    GFR: Estimated Creatinine Clearance: 34 mL/min (A) (by C-G formula based on SCr of 1.98 mg/dL (H)).  Liver Function Tests: Recent Labs  Lab 05/14/23 0202 05/14/23 1030 05/14/23 1516 05/16/23 0357 05/17/23 0428  AST 13* 20 11* 12* 11*  ALT 9 9 10 10 8   ALKPHOS 42 41 45 45 49  BILITOT 0.7 1.1 0.7 1.2 1.3*  PROT 6.0* 5.8* 6.2* 6.0* 6.3*  ALBUMIN 3.1* 2.9* 3.3* 2.9* 2.9*    No results for input(s): "LIPASE", "AMYLASE" in the  last 168 hours. No results for input(s): "AMMONIA" in the last 168 hours.  CBC: Recent Labs  Lab 05/13/23 2259 05/14/23 0202 05/15/23 0550 05/16/23 0357 05/17/23 0428  WBC 6.0 6.7 7.9 9.2 14.2*  NEUTROABS 4.9  --   --  7.6  --   HGB 9.6* 9.1* 8.9* 9.1* 10.2*  HCT 29.7* 28.3* 27.1* 27.1* 29.7*  MCV 92.2 93.1 88.3 88.6 86.3  PLT 207 205 190 191 192      HbA1C: Hgb A1c MFr Bld  Date/Time Value Ref Range Status  04/30/2023 05:11 AM 9.1 (H) 4.8 - 5.6 % Final    Comment:    (NOTE)         Prediabetes: 5.7 - 6.4         Diabetes: >6.4         Glycemic control for adults with diabetes: <7.0   05/24/2019 09:29 AM 10.4 (H) 4.6 - 6.5 % Final    Comment:    Glycemic Control Guidelines for People with Diabetes:Non Diabetic:  <6%Goal of Therapy: <7%Additional Action Suggested:  >8%     Urinalysis: Recent Labs  05/14/23 1430 05/17/23 0754  COLORURINE YELLOW* YELLOW*  LABSPEC 1.013 1.009  PHURINE 5.0 5.0  GLUCOSEU 50* 150*  HGBUR SMALL* MODERATE*  BILIRUBINUR NEGATIVE NEGATIVE  KETONESUR NEGATIVE NEGATIVE  PROTEINUR 30* 30*  NITRITE NEGATIVE NEGATIVE  LEUKOCYTESUR LARGE* TRACE*       Imaging: No results found.   Medications:    sodium chloride 50 mL/hr at 05/17/23 1006   ceFEPime (MAXIPIME) IV Stopped (05/17/23 0855)   feeding supplement (VITAL AF 1.2 CAL) 50 mL/hr at 05/17/23 1006   furosemide (LASIX) 200 mg in dextrose 5 % 100 mL (2 mg/mL) infusion 10 mg/hr (05/17/23 1006)   norepinephrine (LEVOPHED) Adult infusion 12 mcg/min (05/17/23 1006)   propofol (DIPRIVAN) infusion 20 mcg/kg/min (05/17/23 1006)   vancomycin 200 mL/hr at 05/17/23 1006    apixaban  5 mg Oral BID   atorvastatin  40 mg Oral Daily   Chlorhexidine Gluconate Cloth  6 each Topical Daily   docusate  50 mg Per Tube BID   fluticasone furoate-vilanterol  1 puff Inhalation Daily   free water  30 mL Per Tube Q4H   insulin aspart  0-6 Units Subcutaneous Q4H   mouth rinse  15 mL Mouth Rinse Q2H    pantoprazole (PROTONIX) IV  40 mg Intravenous Daily   polyethylene glycol  17 g Per Tube Daily   sodium chloride flush  3 mL Intravenous Q12H    Assessment/ Plan:     67 year old female with a PMHx of morbid obesity, obstructive sleep apnea, bedbound status for 3 years, type 2 diabetes, hypertension, coronary artery disease, congestive heart failure, recent history of cardiac catheterization on 6//6/24.  In the ED she was found to be lethargic with a blood pressure 113/50 and elevated creatinine and potassium.  She was given insulin and glucose and also was placed on sodium bicarbonate drip.  The ED attempted to place a Foley catheter but was unsuccessful.  She has been on losartan, metformin and spironolactone.     Principal Problem:   AKI (acute kidney injury) (HCC) Active Problems:   Essential hypertension   Pulmonary embolism (HCC)   OSA (obstructive sleep apnea)   Altered mental status   Hyperkalemia   Hyponatremia   Anemia   #1: Acute kidney injury: Patient probably has acute kidney injury for the last 1 week after the IV contrast.  The hyperkalemia can be secondary to losartan complicated by spironolactone.    Furosemide drip 10mg /hr. Creatinine stable today with adequate urine output  #2: Hyperkalemia: Patient received doses of insulin along with glucose and sodium bicarbonate in ED.   Resolved  #3: Hyponatremia: This is most likely secondary to dilutional causes with volume overloaded.    Sodium 134, will continue to monitor   #4: Vent failure/respiratory acidosis: This may be secondary to CO2 narcosis.  Patient on ventilator.  Further care as per critical care and pulmonary.   #5: Coronary artery disease/congestive heart failure/hypertension: Cardiology note appreciated.     Remains on furosemide drip   #6: Anemia: Most likely secondary to iron deficiency/acute kidney injury. Hgb at goal     LOS: 3 [facility] 6/18/202411:31 AM

## 2023-05-17 NOTE — Consult Note (Signed)
PHARMACY CONSULT NOTE  Pharmacy Consult for Electrolyte Monitoring and Replacement   Recent Labs: Potassium (mmol/L)  Date Value  05/17/2023 4.1   Magnesium (mg/dL)  Date Value  16/08/9603 1.8   Calcium (mg/dL)  Date Value  54/07/8118 7.8 (L)   Albumin (g/dL)  Date Value  14/78/2956 2.9 (L)  01/09/2018 4.0   Phosphorus (mg/dL)  Date Value  21/30/8657 6.6 (H)   Sodium (mmol/L)  Date Value  05/17/2023 134 (L)  01/09/2018 138   Assessment: Patient is a 67 y/o F with medical history including morbid obesity, limited functional capacity, nursing home resident admitted with altered mentation, AKI, acute CHF. Pharmacy consulted to assist with electrolyte monitoring and replacement as indicated.  Diuresis: IV Lasix at 10 mg/hr  Goal of Therapy:  Electrolytes within normal limits  Plan:  --No replacement currently indicated --Follow-up electrolytes with AM labs tomorrow  Bettey Costa 05/17/2023 12:55 PM

## 2023-05-17 NOTE — Progress Notes (Signed)
CVP set up and recording. Ordered labs drawn and sent to Lab. MD at bedside to assess patient and place new orders. UA also sent to lab. RT collected and sent Respiratory aspirate.

## 2023-05-17 NOTE — Progress Notes (Signed)
eLink Physician-Brief Progress Note Patient Name: Sonya Sanchez DOB: [birth year] MRN: [id number]   Date of Service  05/17/2023  HPI/Events of Note  Patient with sub-optimal glycemic control on current regimen,  last blood sugar was 297 mg / dl on current coverage.  eICU Interventions  Levemir 5 units SQ HS added + SSI transitioned to sensitive scale.        Thomasene Lot [name] 05/17/2023, 8:10 PM

## 2023-05-18 ENCOUNTER — Inpatient Hospital Stay: Payer: Medicare Other

## 2023-05-18 ENCOUNTER — Other Ambulatory Visit: Payer: Self-pay

## 2023-05-18 DIAGNOSIS — I5033 Acute on chronic diastolic (congestive) heart failure: Secondary | ICD-10-CM | POA: Diagnosis not present

## 2023-05-18 DIAGNOSIS — N179 Acute kidney failure, unspecified: Secondary | ICD-10-CM | POA: Diagnosis not present

## 2023-05-18 LAB — BLOOD GAS, ARTERIAL
Acid-base deficit: 2.2 mmol/L — ABNORMAL HIGH (ref 0.0–2.0)
Acid-base deficit: 1.7 mmol/L (ref 0.0–2.0)
Acid-base deficit: 1.3 mmol/L (ref 0.0–2.0)
Bicarbonate: 25.4 mmol/L (ref 20.0–28.0)
Bicarbonate: 24.3 mmol/L (ref 20.0–28.0)
Bicarbonate: 24.8 mmol/L (ref 20.0–28.0)
Delivery systems: POSITIVE
Expiratory PAP: 8 cmH2O
FIO2: 50 %
Inspiratory PAP: 18 cmH2O
Inspiratory PAP: 20 cmH2O
O2 Saturation: 99.7 %
O2 Saturation: 99.2 %
Patient temperature: 98.5
Patient temperature: 37
Patient temperature: 37
pCO2 arterial: 45 mmHg (ref 32–48)
pCO2 arterial: 46 mmHg (ref 32–48)
pH, Arterial: 7.28 — ABNORMAL LOW (ref 7.35–7.45)
pH, Arterial: 7.34 — ABNORMAL LOW (ref 7.35–7.45)
pH, Arterial: 7.34 — ABNORMAL LOW (ref 7.35–7.45)
pO2, Arterial: 226 mmHg — ABNORMAL HIGH (ref 83–108)
pO2, Arterial: 131 mmHg — ABNORMAL HIGH (ref 83–108)

## 2023-05-18 LAB — COMPREHENSIVE METABOLIC PANEL
ALT: 9 U/L (ref 0–44)
AST: 12 U/L — ABNORMAL LOW (ref 15–41)
Albumin: 2.7 g/dL — ABNORMAL LOW (ref 3.5–5.0)
Alkaline Phosphatase: 53 U/L (ref 38–126)
Anion gap: 14 (ref 5–15)
BUN: 95 mg/dL — ABNORMAL HIGH (ref 8–23)
CO2: 22 mmol/L (ref 22–32)
Calcium: 7.6 mg/dL — ABNORMAL LOW (ref 8.9–10.3)
Chloride: 98 mmol/L (ref 98–111)
Creatinine, Ser: 1.93 mg/dL — ABNORMAL HIGH (ref 0.44–1.00)
GFR, Estimated: 28 mL/min — ABNORMAL LOW (ref >60)
Glucose, Bld: 309 mg/dL — ABNORMAL HIGH (ref 70–99)
Potassium: 3.8 mmol/L (ref 3.5–5.1)
Sodium: 134 mmol/L — ABNORMAL LOW (ref 135–145)
Total Bilirubin: 0.8 mg/dL (ref 0.3–1.2)
Total Protein: 6.1 g/dL — ABNORMAL LOW (ref 6.5–8.1)

## 2023-05-18 LAB — CBC
HCT: 29.5 % — ABNORMAL LOW (ref 36.0–46.0)
Hemoglobin: 10 g/dL — ABNORMAL LOW (ref 12.0–15.0)
MCH: 29.9 pg (ref 26.0–34.0)
MCHC: 33.9 g/dL (ref 30.0–36.0)
MCV: 88.1 fL (ref 80.0–100.0)
Platelets: 158 10*3/uL (ref 150–400)
RBC: 3.35 MIL/uL — ABNORMAL LOW (ref 3.87–5.11)
RDW: 19.3 % — ABNORMAL HIGH (ref 11.5–15.5)
WBC: 12.2 10*3/uL — ABNORMAL HIGH (ref 4.0–10.5)
nRBC: 0.2 % (ref 0.0–0.2)

## 2023-05-18 LAB — URINE CULTURE: Culture: >=100000 — AB

## 2023-05-18 LAB — PROTEIN ELECTROPHORESIS, SERUM
A/G Ratio: 1 (ref 0.7–1.7)
Albumin ELP: 2.8 g/dL — ABNORMAL LOW (ref 2.9–4.4)
Alpha-1-Globulin: 0.2 g/dL (ref 0.0–0.4)
Alpha-2-Globulin: 0.9 g/dL (ref 0.4–1.0)
Beta Globulin: 0.9 g/dL (ref 0.7–1.3)
Gamma Globulin: 0.7 g/dL (ref 0.4–1.8)
Globulin, Total: 2.7 g/dL (ref 2.2–3.9)
Total Protein ELP: 5.5 g/dL — ABNORMAL LOW (ref 6.0–8.5)

## 2023-05-18 LAB — GLUCOSE, CAPILLARY
Glucose-Capillary: 286 mg/dL — ABNORMAL HIGH (ref 70–99)
Glucose-Capillary: 276 mg/dL — ABNORMAL HIGH (ref 70–99)
Glucose-Capillary: 294 mg/dL — ABNORMAL HIGH (ref 70–99)
Glucose-Capillary: 275 mg/dL — ABNORMAL HIGH (ref 70–99)
Glucose-Capillary: 250 mg/dL — ABNORMAL HIGH (ref 70–99)
Glucose-Capillary: 261 mg/dL — ABNORMAL HIGH (ref 70–99)

## 2023-05-18 LAB — COOXEMETRY PANEL
Carboxyhemoglobin: 1 % (ref 0.5–1.5)
Methemoglobin: 1 % (ref 0.0–1.5)
O2 Saturation: 99.9 %
Total hemoglobin: 9.9 g/dL — ABNORMAL LOW (ref 12.0–16.0)
Total oxygen content: 97.9 %

## 2023-05-18 LAB — CULTURE, RESPIRATORY W GRAM STAIN

## 2023-05-18 LAB — CULTURE, BLOOD (ROUTINE X 2): Culture: NO GROWTH

## 2023-05-18 MED ORDER — PHENYLEPHRINE HCL-NACL 20-0.9 MG/250ML-% IV SOLN
0.0000 ug/min | INTRAVENOUS | Status: DC
  Administered 2023-05-18: 20 ug/min via INTRAVENOUS
  Administered 2023-05-18: 40 ug/min via INTRAVENOUS
  Administered 2023-05-19: 70 ug/min via INTRAVENOUS
  Administered 2023-05-19 (×2): 50 ug/min via INTRAVENOUS
  Administered 2023-05-19: 40 ug/min via INTRAVENOUS
  Administered 2023-05-20: 60 ug/min via INTRAVENOUS
  Administered 2023-05-20 – 2023-05-21 (×2): 20 ug/min via INTRAVENOUS
  Filled 2023-05-18 (×12): qty 250

## 2023-05-18 MED ORDER — SENNOSIDES 8.8 MG/5ML PO SYRP
5.0000 mL | ORAL_SOLUTION | Freq: Every day | ORAL | Status: DC
  Filled 2023-05-18: qty 5

## 2023-05-18 MED ORDER — INSULIN GLARGINE 100 UNIT/ML ~~LOC~~ SOLN
20.0000 [IU] | Freq: Every day | SUBCUTANEOUS | Status: DC
  Filled 2023-05-18: qty 0.2

## 2023-05-18 MED ORDER — INSULIN ASPART 100 UNIT/ML IJ SOLN
0.0000 [IU] | INTRAMUSCULAR | Status: AC
  Administered 2023-05-18 – 2023-05-19 (×4): 7 [IU] via SUBCUTANEOUS
  Administered 2023-05-19 (×3): 11 [IU] via SUBCUTANEOUS
  Administered 2023-05-20 (×2): 7 [IU] via SUBCUTANEOUS
  Administered 2023-05-20: 11 [IU] via SUBCUTANEOUS
  Administered 2023-05-20 (×3): 7 [IU] via SUBCUTANEOUS
  Administered 2023-05-21: 15 [IU] via SUBCUTANEOUS
  Administered 2023-05-21: 7 [IU] via SUBCUTANEOUS
  Administered 2023-05-21: 20 [IU] via SUBCUTANEOUS
  Administered 2023-05-21: 4 [IU] via SUBCUTANEOUS
  Administered 2023-05-21: 15 [IU] via SUBCUTANEOUS
  Administered 2023-05-21: 4 [IU] via SUBCUTANEOUS
  Administered 2023-05-22: 7 [IU] via SUBCUTANEOUS
  Administered 2023-05-22: 4 [IU] via SUBCUTANEOUS
  Administered 2023-05-22 (×2): 11 [IU] via SUBCUTANEOUS
  Administered 2023-05-22 (×2): 7 [IU] via SUBCUTANEOUS
  Administered 2023-05-23: 4 [IU] via SUBCUTANEOUS
  Administered 2023-05-23: 15 [IU] via SUBCUTANEOUS
  Administered 2023-05-23: 7 [IU] via SUBCUTANEOUS
  Administered 2023-05-23: 4 [IU] via SUBCUTANEOUS
  Administered 2023-05-23: 11 [IU] via SUBCUTANEOUS
  Administered 2023-05-23 – 2023-05-24 (×2): 4 [IU] via SUBCUTANEOUS
  Administered 2023-05-24 (×2): 11 [IU] via SUBCUTANEOUS
  Administered 2023-05-24: 4 [IU] via SUBCUTANEOUS
  Administered 2023-05-24: 7 [IU] via SUBCUTANEOUS
  Administered 2023-05-24: 4 [IU] via SUBCUTANEOUS
  Administered 2023-05-25: 3 [IU] via SUBCUTANEOUS
  Administered 2023-05-25 (×2): 11 [IU] via SUBCUTANEOUS
  Filled 2023-05-18 (×42): qty 1

## 2023-05-18 MED ORDER — ORAL CARE MOUTH RINSE
15.0000 mL | OROMUCOSAL | Status: DC
  Administered 2023-05-19: 15 mL via OROMUCOSAL

## 2023-05-18 MED ORDER — SODIUM CHLORIDE 0.9 % IV SOLN
2.0000 g | Freq: Two times a day (BID) | INTRAVENOUS | Status: DC
  Administered 2023-05-18 – 2023-05-19 (×3): 2 g via INTRAVENOUS
  Filled 2023-05-18 (×3): qty 12.5

## 2023-05-18 MED ORDER — AMIODARONE HCL IN DEXTROSE 360-4.14 MG/200ML-% IV SOLN
INTRAVENOUS | Status: AC
  Filled 2023-05-18: qty 200

## 2023-05-18 MED ORDER — POTASSIUM CHLORIDE 20 MEQ PO PACK
20.0000 meq | PACK | Freq: Once | ORAL | Status: DC
  Filled 2023-05-18: qty 1

## 2023-05-18 MED ORDER — IPRATROPIUM-ALBUTEROL 0.5-2.5 (3) MG/3ML IN SOLN: 3.0000 mL | Freq: Once | RESPIRATORY_TRACT | Status: AC

## 2023-05-18 MED ORDER — SENNOSIDES 8.8 MG/5ML PO SYRP
10.0000 mL | ORAL_SOLUTION | Freq: Every day | ORAL | Status: DC
  Filled 2023-05-18 (×2): qty 10

## 2023-05-18 MED ORDER — AMIODARONE HCL IN DEXTROSE 360-4.14 MG/200ML-% IV SOLN
30.0000 mg/h | INTRAVENOUS | Status: DC
  Administered 2023-05-18 – 2023-05-24 (×13): 30 mg/h via INTRAVENOUS
  Filled 2023-05-18 (×13): qty 200

## 2023-05-18 MED ORDER — ORAL CARE MOUTH RINSE: 15.0000 mL | OROMUCOSAL | Status: DC | PRN

## 2023-05-18 MED ORDER — AMIODARONE LOAD VIA INFUSION
150.0000 mg | Freq: Once | INTRAVENOUS | Status: AC
  Administered 2023-05-18: 150 mg via INTRAVENOUS
  Filled 2023-05-18: qty 83.34

## 2023-05-18 MED ORDER — ONDANSETRON HCL 4 MG/2ML IJ SOLN
INTRAMUSCULAR | Status: AC
  Administered 2023-05-18: 4 mg
  Filled 2023-05-18: qty 2

## 2023-05-18 MED ORDER — INSULIN DETEMIR 100 UNIT/ML ~~LOC~~ SOLN: 15.0000 [IU] | Freq: Every day | SUBCUTANEOUS | Status: DC

## 2023-05-18 MED ORDER — AMIODARONE HCL IN DEXTROSE 360-4.14 MG/200ML-% IV SOLN
60.0000 mg/h | INTRAVENOUS | Status: DC
  Administered 2023-05-18 (×2): 60 mg/h via INTRAVENOUS
  Filled 2023-05-18: qty 200

## 2023-05-18 MED ORDER — IPRATROPIUM-ALBUTEROL 0.5-2.5 (3) MG/3ML IN SOLN
3.0000 mL | RESPIRATORY_TRACT | Status: DC
  Administered 2023-05-18 – 2023-05-19 (×8): 3 mL via RESPIRATORY_TRACT
  Filled 2023-05-18 (×7): qty 3

## 2023-05-18 MED ORDER — IPRATROPIUM-ALBUTEROL 0.5-2.5 (3) MG/3ML IN SOLN
RESPIRATORY_TRACT | Status: AC
  Administered 2023-05-18: 3 mL via RESPIRATORY_TRACT
  Filled 2023-05-18: qty 3

## 2023-05-18 MED ORDER — INSULIN GLARGINE-YFGN 100 UNIT/ML ~~LOC~~ SOLN
20.0000 [IU] | Freq: Every day | SUBCUTANEOUS | Status: DC
  Administered 2023-05-18 – 2023-05-19 (×2): 20 [IU] via SUBCUTANEOUS
  Filled 2023-05-18 (×2): qty 0.2

## 2023-05-18 MED ORDER — INSULIN ASPART 100 UNIT/ML IJ SOLN
0.0000 [IU] | Freq: Three times a day (TID) | INTRAMUSCULAR | Status: DC
  Administered 2023-05-18 (×3): 11 [IU] via SUBCUTANEOUS
  Filled 2023-05-18 (×3): qty 1

## 2023-05-18 MED ORDER — METHYLPREDNISOLONE SODIUM SUCC 40 MG IJ SOLR
40.0000 mg | Freq: Two times a day (BID) | INTRAMUSCULAR | Status: DC
  Administered 2023-05-18 – 2023-05-23 (×11): 40 mg via INTRAVENOUS
  Filled 2023-05-18 (×11): qty 1

## 2023-05-18 NOTE — Progress Notes (Signed)
PT Cancellation Note  Patient Details Name: Sonya Sanchez MRN: [id number] DOB: 09/08/1956   Cancelled Treatment:    Reason Eval/Treat Not Completed: Medical issues which prohibited therapy. Patient currently on amio drip started at 1038 am. NP requested a hold for therapy due to current respiratory status secondary to bipap use. Will re attempt tomorrow.   Malachi Carl, SPT   Malachi Carl 05/18/2023, 1:50 PM

## 2023-05-18 NOTE — Procedures (Signed)
Extubation Procedure Note  Patient Details:   Name: Sonya Sanchez DOB: 09/06/1956 MRN: [id number]   Airway Documentation:    Vent end date: 05/18/23 Vent end time: 0945   Evaluation  O2 sats: stable throughout and currently acceptable Complications: No apparent complications Patient did tolerate procedure well. Bilateral Breath Sounds: Clear, Diminished   Yes  Pt was extubated to a 6L [place]. Cuff leak was heard. No stridor was noted. RN was at the bedside with RT during extubation.  Darolyn Rua 05/18/2023, 9:49 AM

## 2023-05-18 NOTE — Progress Notes (Signed)
Advanced Heart Failure Rounding Note   Subjective:    Remains intubated. Awake on vent. On lasix gtt. Minimal urine output. On NE 6 CVP 8  PCT up to 9. CXR with clearing of edema + new RLL infiltrate. On vanc/cefepime   Objective:   Weight Range:  Vital Signs:   Temp:  [98.3 F (36.8 C)-100.2 F (37.9 C)] 98.3 F (36.8 C) (06/19 0800) Pulse Rate:  [58-87] 68 (06/19 0600) Resp:  [16-23] 18 (06/19 0600) BP: (100-143)/(23-89) 103/29 (06/19 0600) SpO2:  [91 %-100 %] 93 % (06/19 0743) FiO2 (%):  [28 %] 28 % (06/19 0826) Weight:  [126.3 kg] 126.3 kg (06/19 0440) Last BM Date : 05/14/23  Weight change: Filed Weights   05/16/23 0422 05/17/23 0500 05/18/23 0440  Weight: 129.2 kg 125.5 kg 126.3 kg    Intake/Output:   Intake/Output Summary (Last 24 hours) at 05/18/2023 0946 Last data filed at 05/18/2023 0846 Gross per 24 hour  Intake 4695.31 ml  Output 2500 ml  Net 2195.31 ml     Physical Exam: General:  Obese woman. Intubated will awake and follow commands HEENT: + ETT Neck: supple. + LIJ TLC  Cor: PMI nondisplaced. Regular rate & rhythm. No rubs, gallops or murmurs. Lungs: coarse Abdomen: obese soft, nontender, nondistended. No hepatosplenomegaly. No bruits or masses. Good bowel sounds. Extremities: no cyanosis, clubbing, rash, 1+ edema Neuro: awake on vent   Telemetry: NSR 60s Personally reviewed   Labs: Basic Metabolic Panel: Recent Labs  Lab 05/14/23 2155 05/15/23 0550 05/15/23 0551 05/16/23 0357 05/17/23 0428 05/18/23 0355  NA 129*  --  131* 135 134* 134*  K 5.4*  --  4.8 4.2 4.1 3.8  CL 95*  --  95* 98 96* 98  CO2 20*  --  22 20* 19* 22  GLUCOSE 137*  --  115* 127* 223* 309*  BUN 127*  --  123* 107* 100* 95*  CREATININE 3.15*  --  2.75* 2.05* 1.98* 1.93*  CALCIUM 6.9*  --  7.1* 7.2* 7.8* 7.6*  MG  --  1.7  --  2.1 1.8  --   PHOS  --   --   --   --  6.6*  --     Liver Function Tests: Recent Labs  Lab 05/14/23 1030 05/14/23 1516  05/16/23 0357 05/17/23 0428 05/18/23 0355  AST 20 11* 12* 11* 12*  ALT 9 10 10 8 9   ALKPHOS 41 45 45 49 53  BILITOT 1.1 0.7 1.2 1.3* 0.8  PROT 5.8* 6.2* 6.0* 6.3* 6.1*  ALBUMIN 2.9* 3.3* 2.9* 2.9* 2.7*   No results for input(s): "LIPASE", "AMYLASE" in the last 168 hours. No results for input(s): "AMMONIA" in the last 168 hours.  CBC: Recent Labs  Lab 05/13/23 2259 05/14/23 0202 05/15/23 0550 05/16/23 0357 05/17/23 0428 05/18/23 0355  WBC 6.0 6.7 7.9 9.2 14.2* 12.2*  NEUTROABS 4.9  --   --  7.6  --   --   HGB 9.6* 9.1* 8.9* 9.1* 10.2* 10.0*  HCT 29.7* 28.3* 27.1* 27.1* 29.7* 29.5*  MCV 92.2 93.1 88.3 88.6 86.3 88.1  PLT 207 205 190 191 192 158    Cardiac Enzymes: Recent Labs  Lab 05/14/23 1525  CKTOTAL 59    BNP: BNP (last 3 results) Recent Labs    04/29/23 1507  BNP 386.8*    ProBNP (last 3 results) No results for input(s): "PROBNP" in the last 8760 hours.    Other results:  Imaging: DG  Chest Port 1 View  Result Date: 05/17/2023 CLINICAL DATA:  Acute respiratory failure. EXAM: PORTABLE CHEST 1 VIEW COMPARISON:  Radiographs 05/14/2023 and 04/29/2023. FINDINGS: 0742 hours. The endotracheal tube, enteric tube and left IJ central venous catheter are unchanged. The heart size and mediastinal contours are stable with mild cardiomegaly. There is mildly increased opacity at the right lung base with resulting partial obscuration of the right hemidiaphragm. Overall aeration of the lungs is otherwise improved. No evidence of pneumothorax or acute osseous abnormality. Telemetry leads overlie the chest. IMPRESSION: Mildly increased opacity at the right lung base may reflect atelectasis or early infiltrate. Overall improved aeration of the lungs. Stable support system. Electronically Signed   By: Carey Bullocks M.D.   On: 05/17/2023 11:48     Medications:     Scheduled Medications:  apixaban  5 mg Oral BID   atorvastatin  40 mg Oral Daily   Chlorhexidine Gluconate  Cloth  6 each Topical Daily   docusate  50 mg Per Tube BID   fluticasone furoate-vilanterol  1 puff Inhalation Daily   free water  30 mL Per Tube Q4H   insulin aspart  0-20 Units Subcutaneous TID WC   mouth rinse  15 mL Mouth Rinse Q2H   pantoprazole (PROTONIX) IV  40 mg Intravenous Daily   polyethylene glycol  17 g Per Tube Daily   sodium chloride flush  3 mL Intravenous Q12H    Infusions:  sodium chloride 50 mL/hr at 05/18/23 0600   ceFEPime (MAXIPIME) IV 2 g (05/18/23 0754)   feeding supplement (VITAL AF 1.2 CAL) 60 mL/hr at 05/18/23 0800   norepinephrine (LEVOPHED) Adult infusion 6 mcg/min (05/18/23 0600)   propofol (DIPRIVAN) infusion 30 mcg/kg/min (05/18/23 0600)   [START ON 05/19/2023] vancomycin      PRN Medications: acetaminophen **OR** acetaminophen, mouth rinse, polyethylene glycol, senna-docusate   Assessment/Plan:   1. Acute on chronic diastolic HF - Echo 05/01/23 EF 60-65% G2 DD mild AS. RV normal. RVSP 44 - Volume status now improved. Suspect main issue now is PNA and possible sepsis - hold lasix for now  2. RLL PNA with sepsis - PCT 8.96 - continue IV abx - wean NE as tolerated. Hold lasix - d/w CCM at bedside   3. Acute hypoxic/hypercapneic respiratory failure due with RLL PNA and h/o OSA/OHS - wean vent as tolerated - CCM  following   4. AKI - likely due to ATN/shock - baseline Scr 1.1 - peak 3.64 -> 2.75 -> 1.9 - improving - Wean NE as tolerated. Hold diuretics - Keep MAP >= 70 to preserve renal function   5. Morbid obesity - needs weight loss   6. CAD - non-obstructive on cath 2021 - resume ASA - continue statin  CRITICAL CARE Performed by: Arvilla Meres  Total critical care time: 45 minutes  Critical care time was exclusive of separately billable procedures and treating other patients.  Critical care was necessary to treat or prevent imminent or life-threatening deterioration.  Critical care was time spent personally by me  (independent of midlevel providers or residents) on the following activities: development of treatment plan with patient and/or surrogate as well as nursing, discussions with consultants, evaluation of patient's response to treatment, examination of patient, obtaining history from patient or surrogate, ordering and performing treatments and interventions, ordering and review of laboratory studies, ordering and review of radiographic studies, pulse oximetry and re-evaluation of patient's condition.   Length of Stay: 4   Arvilla Meres MD 05/18/2023, 9:46  AM  Advanced Heart Failure Team Pager [phone] (M-F; 7a - 4p)  Please contact [facility] for night-coverage after hours (4p -7a ) and weekends on [email or web address]

## 2023-05-18 NOTE — Consult Note (Signed)
Pharmacy Antibiotic Note  Sonya Sanchez is a 67 y.o. female admitted on 05/13/2023 from nursing home with worsening renal function on blood work. She was noted to be lethargic, denied pain anywhere. She was found to have severe hypercapnic acidemia. She was placed on BIPAP. PCCM was consulted to due AMS lethargy on BIPAP. Pattient's white count continues to increase with possible HCAP vs urosepsis.  Both blood and urine cultures were obtained. Pharmacy has been consulted for Vancomycin and Cefepime dosing.  Patient's initial baseline serum creatinine prior to AKI developing was near 1. Currently receiving lasix gtt@10mg /hr. Scr continues to improve and potential that new baseline is~2. Creatinine remains stable; vancomycin dosed based on AUC. WBC improving and is currently afebrile. Vital signs stable.  Plan: 1) Cotninue vancomycin 1500 mg IV Q 48 hrs. Goal AUC 400-550. Expected AUC: 543.2 SCr used: 1.98 Will obtain peak/trough at steady state if continued  2) Adjust cefepime to 2g IV Q12 hours given CrCl is >30 and trending up   Height: 5' 2.01" (157.5 cm) Weight: 126.3 kg (278 lb 7.1 oz) IBW/kg (Calculated) : 50.12  Temp (24hrs), Avg:99.3 F (37.4 C), Min:98.5 F (36.9 C), Max:100.2 F (37.9 C)  Recent Labs  Lab 05/14/23 0202 05/14/23 1030 05/14/23 2155 05/15/23 0550 05/15/23 0551 05/16/23 0357 05/17/23 0428 05/17/23 0755 05/18/23 0355  WBC 6.7  --   --  7.9  --  9.2 14.2*  --  12.2*  CREATININE 3.66*   < > 3.15*  --  2.75* 2.05* 1.98*  --  1.93*  LATICACIDVEN  --   --   --   --   --   --   --  1.0  --    < > = values in this interval not displayed.     Estimated Creatinine Clearance: 35 mL/min (A) (by C-G formula based on SCr of 1.93 mg/dL (H)).    Allergies  Allergen Reactions   Sulfa Antibiotics Other (See Comments)    Reaction: isn't certain, thinks she ran a fever, or had a rash, maybe both.    Antimicrobials this admission: Vancomycin 6/18 >>  Cefepime 6/18 >>    Dose adjustments this admission: N/A  Microbiology results: 6/18 BCx: in process 6/18 UCx: in process  6/18 Trach cx: in process  6/15 MRSA PCR: positive 6/15 Resp cx: NG  Thank you for allowing pharmacy to be a part of this patient's care.  Marygrace Drought 05/18/2023 7:10 AM

## 2023-05-18 NOTE — Progress Notes (Signed)
Pt's sister updated at bedside regarding plan of care today including: extubation to BiPAP, improved pulmonary edema with Lasix held today by CHF team, AKI slowly improving.  Continuing to treat Pneumonia and UTI.  All questions answered, she is appreciative of update.   Prognosis remains guarded, will have to follow respiratory status closely.     Harlon Ditty, AGACNP-BC [facility] Prefer epic messenger for cross cover needs If after hours, please call E-link

## 2023-05-18 NOTE — Consult Note (Addendum)
PHARMACY CONSULT NOTE  Pharmacy Consult for Electrolyte Monitoring and Replacement   Recent Labs: Potassium (mmol/L)  Date Value  05/18/2023 3.8   Magnesium (mg/dL)  Date Value  40/98/1191 1.8   Calcium (mg/dL)  Date Value  47/82/9562 7.6 (L)   Albumin (g/dL)  Date Value  13/06/6577 2.7 (L)  01/09/2018 4.0   Phosphorus (mg/dL)  Date Value  46/96/2952 6.6 (H)   Sodium (mmol/L)  Date Value  05/18/2023 134 (L)  01/09/2018 138   Assessment: Patient is a 67 y/o F with medical history including morbid obesity, limited functional capacity, nursing home resident admitted with altered mentation, AKI, acute CHF. Pharmacy consulted to assist with electrolyte monitoring and replacement as indicated.  Diuresis: IV Lasix at 10 mg/hr  Goal of Therapy:  Potassium >4, Mg >2  Plan:  --Hold on potassium replacement for now given hyperkalemia on presentation --Check magnesium tomorrow AM --Follow-up electrolytes with AM labs tomorrow  Marygrace Drought 05/18/2023 7:30 AM

## 2023-05-18 NOTE — Progress Notes (Signed)
eLink Physician-Brief Progress Note Patient Name: Sonya Sanchez DOB: 14-Jul-1956 MRN: [id number]   Date of Service  05/18/2023  HPI/Events of Note  ABG reviewed.  eICU Interventions  FiO2 weaned.        Thomasene Lot Stanislaus Kaltenbach 05/18/2023, 11:01 PM

## 2023-05-18 NOTE — Progress Notes (Signed)
[facility]  PROGRESS NOTE   Subjective:   Sitting up in bed Weaned to bipap Sedation:none Remains on Amio and Neo  Creatinine 1.93 Foley catheter- UOP 2.1L  Objective:  Vital signs: Blood pressure (!) 98/28, pulse 85, temperature 98.5 F (36.9 C), temperature source Axillary, resp. rate 11, height 5' 2.01" (1.575 m), weight 126.3 kg, SpO2 100 %.  Intake/Output Summary (Last 24 hours) at 05/18/2023 1534 Last data filed at 05/18/2023 1400 Gross per 24 hour  Intake 3083.85 ml  Output 2300 ml  Net 783.85 ml    Filed Weights   05/16/23 0422 05/17/23 0500 05/18/23 0440  Weight: 129.2 kg 125.5 kg 126.3 kg     Physical Exam: General:  No acute distress  Head:  Normocephalic, atraumatic.   Eyes:  Anicteric  Lungs:  Rhonchi, Bipap  Heart:  S1S2 no rubs  Abdomen:   Soft, nontender, bowel sounds present  Extremities: 1+ peripheral edema.  Neurologic: Alert but drowsy   Skin:  No lesions  Access: None    Basic Metabolic Panel: Recent Labs  Lab 05/14/23 2155 05/15/23 0550 05/15/23 0551 05/16/23 0357 05/17/23 0428 05/18/23 0355  NA 129*  --  131* 135 134* 134*  K 5.4*  --  4.8 4.2 4.1 3.8  CL 95*  --  95* 98 96* 98  CO2 20*  --  22 20* 19* 22  GLUCOSE 137*  --  115* 127* 223* 309*  BUN 127*  --  123* 107* 100* 95*  CREATININE 3.15*  --  2.75* 2.05* 1.98* 1.93*  CALCIUM 6.9*  --  7.1* 7.2* 7.8* 7.6*  MG  --  1.7  --  2.1 1.8  --   PHOS  --   --   --   --  6.6*  --     GFR: Estimated Creatinine Clearance: 35 mL/min (A) (by C-G formula based on SCr of 1.93 mg/dL (H)).  Liver Function Tests: Recent Labs  Lab 05/14/23 1030 05/14/23 1516 05/16/23 0357 05/17/23 0428 05/18/23 0355  AST 20 11* 12* 11* 12*  ALT 9 10 10 8 9   ALKPHOS 41 45 45 49 53  BILITOT 1.1 0.7 1.2 1.3* 0.8  PROT 5.8* 6.2* 6.0* 6.3* 6.1*  ALBUMIN 2.9* 3.3* 2.9* 2.9* 2.7*    No results for input(s): "LIPASE", "AMYLASE" in the last 168 hours. No results for input(s): "AMMONIA"  in the last 168 hours.  CBC: Recent Labs  Lab 05/13/23 2259 05/14/23 0202 05/15/23 0550 05/16/23 0357 05/17/23 0428 05/18/23 0355  WBC 6.0 6.7 7.9 9.2 14.2* 12.2*  NEUTROABS 4.9  --   --  7.6  --   --   HGB 9.6* 9.1* 8.9* 9.1* 10.2* 10.0*  HCT 29.7* 28.3* 27.1* 27.1* 29.7* 29.5*  MCV 92.2 93.1 88.3 88.6 86.3 88.1  PLT 207 205 190 191 192 158      HbA1C: Hgb A1c MFr Bld  Date/Time Value Ref Range Status  04/30/2023 05:11 AM 9.1 (H) 4.8 - 5.6 % Final    Comment:    (NOTE)         Prediabetes: 5.7 - 6.4         Diabetes: >6.4         Glycemic control for adults with diabetes: <7.0   05/24/2019 09:29 AM 10.4 (H) 4.6 - 6.5 % Final    Comment:    Glycemic Control Guidelines for People with Diabetes:Non Diabetic:  <6%Goal of Therapy: <7%Additional Action Suggested:  >8%  Urinalysis: Recent Labs    05/17/23 0754  COLORURINE YELLOW*  LABSPEC 1.009  PHURINE 5.0  GLUCOSEU 150*  HGBUR MODERATE*  BILIRUBINUR NEGATIVE  KETONESUR NEGATIVE  PROTEINUR 30*  NITRITE NEGATIVE  LEUKOCYTESUR TRACE*       Imaging: DG Chest Port 1 View  Result Date: 05/18/2023 CLINICAL DATA:  Acute respiratory failure EXAM: PORTABLE CHEST 1 VIEW COMPARISON:  05/17/2023 FINDINGS: Endotracheal tube with the tip 3.4 cm above the carina. Nasogastric tube coursing below the diaphragm. Hazy right lower lobe airspace disease concerning for atelectasis versus pneumonia. Likely trace right pleural effusion. No pneumothorax. Stable cardiomegaly. Dual lumen left jugular central venous catheter with the tip projecting over the SVC. No acute osseous abnormality. IMPRESSION: No active disease. Electronically Signed   By: Elige Ko M.D.   On: 05/18/2023 11:44   DG Chest Port 1 View  Result Date: 05/17/2023 CLINICAL DATA:  Acute respiratory failure. EXAM: PORTABLE CHEST 1 VIEW COMPARISON:  Radiographs 05/14/2023 and 04/29/2023. FINDINGS: 0742 hours. The endotracheal tube, enteric tube and left IJ central  venous catheter are unchanged. The heart size and mediastinal contours are stable with mild cardiomegaly. There is mildly increased opacity at the right lung base with resulting partial obscuration of the right hemidiaphragm. Overall aeration of the lungs is otherwise improved. No evidence of pneumothorax or acute osseous abnormality. Telemetry leads overlie the chest. IMPRESSION: Mildly increased opacity at the right lung base may reflect atelectasis or early infiltrate. Overall improved aeration of the lungs. Stable support system. Electronically Signed   By: Carey Bullocks M.D.   On: 05/17/2023 11:48     Medications:    amiodarone 60 mg/hr (05/18/23 1400)   Followed by   amiodarone 30 mg/hr (05/18/23 1036)   ceFEPime (MAXIPIME) IV Stopped (05/18/23 0826)   phenylephrine (NEO-SYNEPHRINE) Adult infusion 90 mcg/min (05/18/23 1400)   [START ON 05/19/2023] vancomycin      apixaban  5 mg Oral BID   atorvastatin  40 mg Oral Daily   Chlorhexidine Gluconate Cloth  6 each Topical Daily   fluticasone furoate-vilanterol  1 puff Inhalation Daily   insulin aspart  0-20 Units Subcutaneous TID WC   insulin glargine-yfgn  20 Units Subcutaneous Daily   ipratropium-albuterol  3 mL Nebulization Q4H   methylPREDNISolone (SOLU-MEDROL) injection  40 mg Intravenous Q12H   mouth rinse  15 mL Mouth Rinse Q2H   pantoprazole (PROTONIX) IV  40 mg Intravenous Daily   polyethylene glycol  17 g Per Tube Daily   sennosides  10 mL Per Tube Daily   sodium chloride flush  3 mL Intravenous Q12H    Assessment/ Plan:     67 year old female with a PMHx of morbid obesity, obstructive sleep apnea, bedbound status for 3 years, type 2 diabetes, hypertension, coronary artery disease, congestive heart failure, recent history of cardiac catheterization on 6//6/24.  In the ED she was found to be lethargic with a blood pressure 113/50 and elevated creatinine and potassium.  She was given insulin and glucose and also was placed on  sodium bicarbonate drip.  The ED attempted to place a Foley catheter but was unsuccessful.  She has been on losartan, metformin and spironolactone.     Principal Problem:   AKI (acute kidney injury) (HCC) Active Problems:   Essential hypertension   Pulmonary embolism (HCC)   OSA (obstructive sleep apnea)   Altered mental status   Hyperkalemia   Hyponatremia   Anemia   #1: Acute kidney injury: Patient probably has acute kidney  injury for the last 1 week after the IV contrast.  The hyperkalemia can be secondary to losartan complicated by spironolactone.    Creatinine stable, furosemide held for now  #2: Hyperkalemia: Patient received doses of insulin along with glucose and sodium bicarbonate in ED.   Remains stable, 3.8  #3: Hyponatremia: This is most likely secondary to dilutional causes with volume overloaded.    Sodium stable at 134  #4: Vent failure/respiratory acidosis: This may be secondary to CO2 narcosis. Patient weaned to Bipap   #5: Coronary artery disease/congestive heart failure/hypertension: Cardiology note appreciated.     Furosemide held due to improved volume status   #6: Anemia: Most likely secondary to iron deficiency/acute kidney injury. Hgb 10.0     LOS: 4 [facility] 6/19/20243:34 PM

## 2023-05-18 NOTE — Procedures (Signed)
Arterial Catheter Insertion Procedure Note  Tandrea Mandella  [id number]  15-Jul-1956  Date:05/18/23  Time:2:42 PM    Provider Performing: Judithe Modest    Procedure: Insertion of Arterial Line (40981) with US guidance (19147)   Indication(s) Blood pressure monitoring and/or need for frequent ABGs  Consent Risks of the procedure as well as the alternatives and risks of each were explained to the patient and/or caregiver.  Consent for the procedure was obtained and is signed in the bedside chart  Anesthesia None   Time Out Verified patient identification, verified procedure, site/side was marked, verified correct patient position, special equipment/implants available, medications/allergies/relevant history reviewed, required imaging and test results available.   Sterile Technique Maximal sterile technique including full sterile barrier drape, hand hygiene, sterile gown, sterile gloves, mask, hair covering, sterile ultrasound probe cover (if used).   Procedure Description Area of catheter insertion was cleaned with chlorhexidine and draped in sterile fashion. With real-time ultrasound guidance an arterial catheter was placed into the left radial artery.  Appropriate arterial tracings confirmed on monitor.     Complications/Tolerance None; patient tolerated the procedure well.   EBL Minimal   Specimen(s) None   Harlon Ditty, AGACNP-BC [facility] Prefer epic messenger for cross cover needs If after hours, please call E-link

## 2023-05-18 NOTE — Progress Notes (Signed)
Nutrition Follow-up  DOCUMENTATION CODES:   Obesity unspecified  INTERVENTION:   -D/c Vital 1.2 due to no feeding access -RD will follow for diet advancement and add supplements as appropriate  NUTRITION DIAGNOSIS:   Inadequate oral intake related to inability to eat (pt sedated and ventilated) as evidenced by NPO status.  Ongoing  GOAL:   Patient will meet greater than or equal to 90% of their needs  Unmet  MONITOR:   Diet advancement  REASON FOR ASSESSMENT:   Ventilator    ASSESSMENT:   67 y/o female with h/o morbid obesity, HTN, chronic respiratory failure on 2L O2 at baseline, OSA on CPAP, bedbound (hasn't walked in 3 years), CAD, PE/DVT, HLD, DM, IBS-C, gout and recent admission for NSTEMI requiring left heart cath (found to have chronic occlusion of her distal RCA and mod AS) and who is now admitted with volume overload, AKI and UTI.  6/19- extubated, transitioned to Bi-pap  Reviewed I/O's: +2.3 L x 24 hours and -787 ml since admission  UOP: 2.1 L x 24 hours   Per Advanced Heart Failure team notes, suspect main issue is now pneumonia and possible sepsis.   Case discussed with RN, MD, and during ICU rounds. Pt extubated this morning and is currently on Bi-pap. Pt is tolerating ok, but seems very weak. She is alert and oriented and following commands. RN reports increase in heart rate. Family is planning to visit pt today.   Pt NPO. Pt not receiving any nutrition; will d/c TF orders due to lack of feeding access.   Wt has been stable since admission.   Medications reviewed and include solu-medrol, miralax, and protonix.   Labs reviewed: Na: 134, CBGS: 294 (inpatient orders for glycemic control are 0-20 units insulin aspart every 4 hours and 20 units insulin glargine-yfgn daily).    Diet Order:   Diet Order     None       EDUCATION NEEDS:   No education needs have been identified at this time  Skin:  Skin Assessment: Skin Integrity Issues: Skin  Integrity Issues:: Other (Comment) Other: MASD to bilateral groin, thighs, and breasts  Last BM:  05/14/23  Height:   Ht Readings from Last 1 Encounters:  05/17/23 5' 2.01" (1.575 m)    Weight:   Wt Readings from Last 1 Encounters:  05/18/23 126.3 kg    Ideal Body Weight:  50 kg  BMI:  Body mass index is 50.91 kg/m.  Estimated Nutritional Needs:   Kcal:  1750-1950  Protein:  100-115 grams  Fluid:  1.8-2.0 L    Levada Schilling, RD, LDN, CDCES Registered Dietitian II Certified Diabetes Care and Education Specialist Please refer to [facility] for RD and/or RD on-call/weekend/after hours pager

## 2023-05-18 NOTE — Progress Notes (Signed)
Pt on BiPAP, still complains of some SOB, at times seems more lethargic, will obtain ABG.  Given tenuous respiratory status, would benefit from Arterial line for frequent ABG's if needed.  Pt consents to A-line placement as well as family at bedside.   Attempted right radial a-line placement under US guidance.  Able to cannulate artery with blood return, however unable to thread catheter itself.  Attempted 2 more times at more distal insertion site with same result, therefore attempt aborted.  Pressure held at the site, no signs of bleeding or hematoma.  ABG obtained by respiratory from left radial site, results pending.     Harlon Ditty, AGACNP-BC [facility] Prefer epic messenger for cross cover needs If after hours, please call E-link

## 2023-05-18 NOTE — Progress Notes (Addendum)
PCCM note  Patient had increased dyspnea, wheezing postextubation.  She was placed on BiPAP given nebulizers, IV steroids. Repeat ABG shows improvement in pCO2 and she is more awake.  Does not need intubation at present.  Will monitor closely in the ICU Sister updated at bedside.  Additional CC time 30 mins Chilton Greathouse MD [facility] See Amion for pager  If no response to pager , please call [phone] until 7pm After 7:00 pm call Elink  [phone] 05/18/2023, 3:35 PM

## 2023-05-18 NOTE — Progress Notes (Addendum)
NAME:  Sonya Sanchez, MRN:  [id number], DOB:  1956/09/23, LOS: 4 ADMISSION DATE:  05/13/2023, CONSULTATION DATE:  05/14/2023 REFERRING MD:  EDP, CHIEF COMPLAINT:   Altered mental status, acute hypoxic, hypercapnic respiratory failure  History of Present Illness:   67 yo F with obesity, from nursing home. She has as history of PE, DVT, aortic stenosis, HTN, chronic bronchitis, OSA, AKI, dermatomyositis, DM2, dyslipidemia, came in due to worsening renal function noted on bloodwork. She was noted to be lethargic, denied pain anywhere. She was found to have severe hypercapnic acidemia. She was placed on BIPAP. PCCM was consulted to due AMS lethargy on BIPAP.   Pertinent  Medical History    has a past medical history of Calculus of kidney, Diabetes mellitus without complication (HCC), DVT (deep venous thrombosis) (HCC), Family history of early CAD, Gout, unspecified, Heart murmur, Hyperlipidemia, Hypertension, Morbid obesity (HCC), Normal cardiac stress test, Phlebitis and thrombophlebitis of other deep vessels of lower extremities, Phlebitis and thrombophlebitis of other deep vessels of lower extremities, Pulmonary emboli (HCC), Spinal stenosis, unspecified region other than cervical, Tobacco abuse, Type II or unspecified type diabetes mellitus without mention of complication, uncontrolled, Unspecified sleep apnea, and Urinary tract infection, site not specified.   Significant Hospital Events: Including procedures, antibiotic start and stop dates in addition to other pertinent events   05/15/23 central line placed, nephrology, cardiology consult 05/16/2023-Lasix drip increased, metolazone added 6/18-started antibiotics due to increasing WBC count, pancultures sent  Interim History / Subjective:   Remains on Lasix drip and Levophed, I's/O+ Antibiotics started yesterday.  Cultures are pending.  Objective   Blood pressure (!) 103/29, pulse 68, temperature 98.5 F (36.9 C), temperature source Axillary,  resp. rate 18, height 5' 2.01" (1.575 m), weight 126.3 kg, SpO2 94 %. CVP:  [4 mmHg-10 mmHg] 4 mmHg  Vent Mode: PRVC FiO2 (%):  [28 %] 28 % Set Rate:  [16 bmp] 16 bmp Vt Set:  [450 mL] 450 mL PEEP:  [5 cmH20] 5 cmH20 Plateau Pressure:  [12 cmH20-17 cmH20] 17 cmH20   Intake/Output Summary (Last 24 hours) at 05/18/2023 0708 Last data filed at 05/18/2023 0600 Gross per 24 hour  Intake 4448.49 ml  Output 2100 ml  Net 2348.49 ml   Filed Weights   05/16/23 0422 05/17/23 0500 05/18/23 0440  Weight: 129.2 kg 125.5 kg 126.3 kg    Examination: Blood pressure (!) 103/29, pulse 68, temperature 98.5 F (36.9 C), temperature source Axillary, resp. rate 18, height 5' 2.01" (1.575 m), weight 126.3 kg, SpO2 94 %. Gen:      No acute distress HEENT:  EOMI, sclera anicteric ET tube Neck:     No masses; no thyromegaly Lungs:    Clear to auscultation bilaterally; normal respiratory effort CV:         Regular rate and rhythm; no murmurs Abd:      + bowel sounds; soft, non-tender; no palpable masses, no distension Ext:    No edema; adequate peripheral perfusion Skin:      Warm and dry; no rash Neuro: Sedated  Labs/imaging reviewed Significant for sodium 134, BUN/creatinine 95/1.93 AST 12, ALT 9 WBC 12.2, hemoglobin 10.0, platelets 158  Resolved Hospital Problem list    Assessment & Plan:  Acute on chronic diastolic heart failure Continue diuresis with Lasix and metolazone Monitor CVP  AKI secondary to IV contrast, ATN Hyperkalemia-improved Creatinine is stable Monitor urine output and creatinine  Sepsis.  Possible sources are hospital-acquired pneumonia versus urosepsis Continue empiric coverage with Vanco,  cefepime Procalcitonin is elevated.  Follow cultures  Acute on chronic hypoxic, hypercarbic respiratory failure secondary to CHF History of OSA, morbid obesity Continue vent support.  Pressure support weans as tolerated.  Dermatomyositis Monitor CK Of azathioprine due to concern  for sepsis.  H/O DVT, PE Continue Eliquis  Diabetes mellitus SSI coverage Increase Levemir dose  Best Practice (right click and "Reselect all SmartList Selections" daily)   Diet/type: tubefeeds DVT prophylaxis: DOAC GI prophylaxis: PPI Lines: Central line Foley:  N/A Code Status:  full code Last date of multidisciplinary goals of care discussion []   Critical care time:    The patient is critically ill with multiple organ system failure and requires high complexity decision making for assessment and support, frequent evaluation and titration of therapies, advanced monitoring, review of radiographic studies and interpretation of complex data.   Critical Care Time devoted to patient care services, exclusive of separately billable procedures, described in this note is 45 minutes.   Chilton Greathouse MD [facility] See Amion for pager  If no response to pager , please call [phone] until 7pm After 7:00 pm call Elink  [phone] 05/18/2023, 7:20 AM

## 2023-05-18 NOTE — TOC Initial Note (Signed)
Transition of Care [facility]) - Initial/Assessment Note    Patient Details  Name: Sonya Sanchez MRN: [id number] Date of Birth: [birth year]  [facility]) CM/SW Contact:    Margarito Liner, LCSW Phone Number: 05/18/2023, 1:33 PM  Clinical Narrative:  Per chart review, patient is a long-term resident at [facility]. Patient extubated to bipap today. Will continue to follow progress and facilitate return to SNF once medically stable.                Expected Discharge Plan: Skilled Nursing Facility Barriers to Discharge: Continued Medical Work up   Patient Goals and CMS Choice            Expected Discharge Plan and Services       Living arrangements for the past 2 months: Skilled Nursing Facility                                      Prior Living Arrangements/Services Living arrangements for the past 2 months: Skilled Nursing Facility Lives with:: Facility Resident Patient language and need for interpreter reviewed:: Yes        Need for Family Participation in Patient Care: Yes (Comment) Care giver support system in place?: Yes (comment)   Criminal Activity/Legal Involvement Pertinent to Current Situation/Hospitalization: No - Comment as needed  Activities of Daily Living      Permission Sought/Granted                  Emotional Assessment         Alcohol / Substance Use: Not Applicable Psych Involvement: No (comment)  Admission diagnosis:  Hyperkalemia [E87.5] AKI (acute kidney injury) (HCC) [N17.9] Patient Active Problem List   Diagnosis Date Noted   Hyperkalemia 05/14/2023   AKI (acute kidney injury) (HCC) 05/14/2023   Hyponatremia 05/14/2023   Anemia 05/14/2023   Coronary artery disease of native artery of native heart with stable angina pectoris (HCC) 05/05/2023   Acute heart failure with preserved ejection fraction (HCC) 05/02/2023   Acute respiratory failure with hypoxia and hypercapnia (HCC) 04/30/2023   Acute  metabolic encephalopathy 04/30/2023   Acute on chronic hypoxic respiratory failure (HCC) 04/29/2023   Bedbound 04/29/2023   Adult onset dermatomyositis (HCC) 04/29/2023   Altered mental status 04/29/2023   Irritable bowel syndrome with constipation 05/11/2019   Post-nasal drip 02/21/2019   Allergic rhinitis 02/21/2019   Cataracts, bilateral 01/27/2018   Uncontrolled type 2 diabetes mellitus with hyperglycemia (HCC) 01/19/2018   Bronchitis 12/22/2016   OSA (obstructive sleep apnea) 12/08/2016   Elevated troponin 08/11/2015   Non-STEMI (non-ST elevated myocardial infarction) (HCC)    Poorly controlled type 2 diabetes mellitus with circulatory disorder (HCC)    Aortic valve stenosis 01/30/2014   Chronic venous insufficiency 08/03/2013   Pulmonary embolism (HCC) 03/21/2013   DVT (deep venous thrombosis) (HCC) 03/21/2013   Hyperlipidemia 02/13/2013   Morbid obesity (HCC) 02/13/2013   Essential hypertension 12/25/2012   PCP:  Pcp, No Pharmacy:   [facility] - [place]omen[place] Moras, [place] - [address] [place] [address] Phone: [phone] Fax: [phone]     Social Determinants of Health (SDOH) Social History: SDOH Screenings   Food Insecurity: No Food Insecurity (04/30/2023)  Housing: Low Risk  (04/30/2023)  Transportation Needs: No Transportation Needs (04/30/2023)  Utilities: Not At Risk (04/30/2023)  Tobacco Use: Medium Risk (05/13/2023)   SDOH Interventions:  Readmission Risk Interventions    05/18/2023    1:32 PM  Readmission Risk Prevention Plan  Transportation Screening Complete  Medication Review (RN Care Manager) Complete  PCP or Specialist appointment within 3-5 days of discharge Complete  SW Recovery Care/Counseling Consult Complete  Palliative Care Screening Not Applicable  Skilled Nursing Facility Complete

## 2023-05-19 ENCOUNTER — Inpatient Hospital Stay: Payer: Medicare Other

## 2023-05-19 DIAGNOSIS — G4733 Obstructive sleep apnea (adult) (pediatric): Secondary | ICD-10-CM

## 2023-05-19 DIAGNOSIS — N179 Acute kidney failure, unspecified: Secondary | ICD-10-CM | POA: Diagnosis not present

## 2023-05-19 DIAGNOSIS — J9602 Acute respiratory failure with hypercapnia: Secondary | ICD-10-CM

## 2023-05-19 DIAGNOSIS — D649 Anemia, unspecified: Secondary | ICD-10-CM

## 2023-05-19 DIAGNOSIS — I2583 Coronary atherosclerosis due to lipid rich plaque: Secondary | ICD-10-CM

## 2023-05-19 DIAGNOSIS — J189 Pneumonia, unspecified organism: Secondary | ICD-10-CM

## 2023-05-19 DIAGNOSIS — J9601 Acute respiratory failure with hypoxia: Secondary | ICD-10-CM | POA: Diagnosis not present

## 2023-05-19 DIAGNOSIS — R4182 Altered mental status, unspecified: Secondary | ICD-10-CM

## 2023-05-19 DIAGNOSIS — I10 Essential (primary) hypertension: Secondary | ICD-10-CM

## 2023-05-19 DIAGNOSIS — I2510 Atherosclerotic heart disease of native coronary artery without angina pectoris: Secondary | ICD-10-CM

## 2023-05-19 LAB — URINE CULTURE

## 2023-05-19 LAB — MAGNESIUM: Magnesium: 1.8 mg/dL (ref 1.7–2.4)

## 2023-05-19 LAB — CK: Total CK: 23 U/L — ABNORMAL LOW (ref 38–234)

## 2023-05-19 LAB — HEPATIC FUNCTION PANEL
ALT: 9 U/L (ref 0–44)
AST: 10 U/L — ABNORMAL LOW (ref 15–41)
Albumin: 2.4 g/dL — ABNORMAL LOW (ref 3.5–5.0)
Alkaline Phosphatase: 49 U/L (ref 38–126)
Bilirubin, Direct: 0.2 mg/dL (ref 0.0–0.2)
Indirect Bilirubin: 0.5 mg/dL (ref 0.3–0.9)
Total Bilirubin: 0.7 mg/dL (ref 0.3–1.2)
Total Protein: 5.8 g/dL — ABNORMAL LOW (ref 6.5–8.1)

## 2023-05-19 LAB — RENAL FUNCTION PANEL
Albumin: 2.5 g/dL — ABNORMAL LOW (ref 3.5–5.0)
Anion gap: 14 (ref 5–15)
BUN: 100 mg/dL — ABNORMAL HIGH (ref 8–23)
CO2: 23 mmol/L (ref 22–32)
Calcium: 7.8 mg/dL — ABNORMAL LOW (ref 8.9–10.3)
Chloride: 99 mmol/L (ref 98–111)
Creatinine, Ser: 1.75 mg/dL — ABNORMAL HIGH (ref 0.44–1.00)
GFR, Estimated: 31 mL/min — ABNORMAL LOW (ref >60)
Glucose, Bld: 306 mg/dL — ABNORMAL HIGH (ref 70–99)
Phosphorus: 6.8 mg/dL — ABNORMAL HIGH (ref 2.5–4.6)
Potassium: 4.1 mmol/L (ref 3.5–5.1)
Sodium: 136 mmol/L (ref 135–145)

## 2023-05-19 LAB — CBC
HCT: 27.6 % — ABNORMAL LOW (ref 36.0–46.0)
Hemoglobin: 9.1 g/dL — ABNORMAL LOW (ref 12.0–15.0)
MCH: 29.4 pg (ref 26.0–34.0)
MCHC: 33 g/dL (ref 30.0–36.0)
MCV: 89 fL (ref 80.0–100.0)
Platelets: 154 10*3/uL (ref 150–400)
RBC: 3.1 MIL/uL — ABNORMAL LOW (ref 3.87–5.11)
RDW: 19.5 % — ABNORMAL HIGH (ref 11.5–15.5)
WBC: 7.1 10*3/uL (ref 4.0–10.5)
nRBC: 0.3 % — ABNORMAL HIGH (ref 0.0–0.2)

## 2023-05-19 LAB — GLUCOSE, CAPILLARY
Glucose-Capillary: 255 mg/dL — ABNORMAL HIGH (ref 70–99)
Glucose-Capillary: 253 mg/dL — ABNORMAL HIGH (ref 70–99)
Glucose-Capillary: 253 mg/dL — ABNORMAL HIGH (ref 70–99)
Glucose-Capillary: 235 mg/dL — ABNORMAL HIGH (ref 70–99)
Glucose-Capillary: 247 mg/dL — ABNORMAL HIGH (ref 70–99)
Glucose-Capillary: 230 mg/dL — ABNORMAL HIGH (ref 70–99)

## 2023-05-19 LAB — CULTURE, RESPIRATORY W GRAM STAIN

## 2023-05-19 LAB — CULTURE, BLOOD (ROUTINE X 2)

## 2023-05-19 MED ORDER — ATORVASTATIN CALCIUM 80 MG PO TABS
80.0000 mg | ORAL_TABLET | Freq: Every day | ORAL | Status: DC
  Administered 2023-05-20 – 2023-05-27 (×8): 80 mg via ORAL
  Filled 2023-05-19: qty 4
  Filled 2023-05-19 (×2): qty 1
  Filled 2023-05-19 (×5): qty 4

## 2023-05-19 MED ORDER — SENNOSIDES 8.8 MG/5ML PO SYRP
10.0000 mL | ORAL_SOLUTION | Freq: Two times a day (BID) | ORAL | Status: DC
  Administered 2023-05-20 – 2023-05-23 (×3): 10 mL
  Filled 2023-05-19 (×9): qty 10

## 2023-05-19 MED ORDER — IPRATROPIUM-ALBUTEROL 0.5-2.5 (3) MG/3ML IN SOLN
3.0000 mL | Freq: Three times a day (TID) | RESPIRATORY_TRACT | Status: DC
  Administered 2023-05-19 – 2023-05-23 (×12): 3 mL via RESPIRATORY_TRACT
  Filled 2023-05-19 (×12): qty 3

## 2023-05-19 MED ORDER — PIPERACILLIN-TAZOBACTAM 3.375 G IVPB
3.3750 g | Freq: Three times a day (TID) | INTRAVENOUS | Status: AC
  Administered 2023-05-19 – 2023-05-25 (×20): 3.375 g via INTRAVENOUS
  Filled 2023-05-19 (×20): qty 50

## 2023-05-19 MED ORDER — MAGNESIUM SULFATE IN D5W 1-5 GM/100ML-% IV SOLN
1.0000 g | Freq: Once | INTRAVENOUS | Status: DC
  Filled 2023-05-19: qty 100

## 2023-05-19 MED ORDER — FUROSEMIDE 10 MG/ML IJ SOLN
60.0000 mg | Freq: Once | INTRAMUSCULAR | Status: AC
  Administered 2023-05-19: 60 mg via INTRAVENOUS
  Filled 2023-05-19: qty 6

## 2023-05-19 MED ORDER — MAGNESIUM SULFATE 2 GM/50ML IV SOLN
2.0000 g | Freq: Once | INTRAVENOUS | Status: AC
  Administered 2023-05-19: 2 g via INTRAVENOUS
  Filled 2023-05-19: qty 50

## 2023-05-19 MED ORDER — MUPIROCIN 2 % EX OINT
1.0000 | TOPICAL_OINTMENT | Freq: Two times a day (BID) | CUTANEOUS | Status: AC
  Administered 2023-05-19 – 2023-05-23 (×10): 1 via NASAL
  Filled 2023-05-19: qty 22

## 2023-05-19 MED ORDER — CHLORHEXIDINE GLUCONATE CLOTH 2 % EX PADS: 6.0000 | MEDICATED_PAD | Freq: Every day | CUTANEOUS | Status: DC

## 2023-05-19 MED ORDER — INSULIN GLARGINE-YFGN 100 UNIT/ML ~~LOC~~ SOLN
20.0000 [IU] | Freq: Two times a day (BID) | SUBCUTANEOUS | Status: DC
  Administered 2023-05-19 – 2023-05-20 (×2): 20 [IU] via SUBCUTANEOUS
  Filled 2023-05-19 (×3): qty 0.2

## 2023-05-19 MED ORDER — ORAL CARE MOUTH RINSE: 15.0000 mL | OROMUCOSAL | Status: DC | PRN

## 2023-05-19 NOTE — Progress Notes (Signed)
Pt voided a large unmeasured amount on ped pad. External catheter in place and exchanged.

## 2023-05-19 NOTE — Evaluation (Signed)
Physical Therapy Evaluation Patient Details Name: Justyne Bartlette MRN: [id number] DOB: 1956/07/09 Today's Date: 05/19/2023  History of Present Illness  Patient is a 67 y.o female admitted for AKI and hypercalamia. PMH of HTN, PE, OSA, hyponatremia, altered mental status and pneumonia.  Clinical Impression  Pt is a pleasant 67 year old female who was admitted for AKI and hyperkalemia. Pt performs bed mobility max assist +2 .Transfers via hoyer lift, and is unable to ambulate. Patient supine in bed at start of session. Assessed patient strength in LE's and UE's generalized weakness throughout. Patient unable to move LE's without max assist. Able to complete 50% shoulder flexion and fatigues after 5 reps. Patient reported that this is her baseline. Pt demonstrates deficits with mobility, ROM, strength, coordination and balance. Monitored vitals through out the session, O2 maintained at 92% and BP no greater than 101/53. Patient left in bed with alarm and call bell, notified nursing of status. Patient is currently performing at baseline level and does not need skilled physical therapy.        Recommendations for follow up therapy are one component of a multi-disciplinary discharge planning process, led by the attending physician.  Recommendations may be updated based on patient status, additional functional criteria and insurance authorization.  Follow Up Recommendations Can patient physically be transported by private vehicle: No     Assistance Recommended at Discharge Frequent or constant Supervision/Assistance  Patient can return home with the following  Two people to help with walking and/or transfers;Two people to help with bathing/dressing/bathroom;Assistance with cooking/housework;Assist for transportation;Help with stairs or ramp for entrance    Equipment Recommendations None recommended by PT  Recommendations for Other Services       Functional Status Assessment Patient has had a recent  decline in their functional status and/or demonstrates limited ability to make significant improvements in function in a reasonable and predictable amount of time     Precautions / Restrictions Precautions Precautions: Fall Restrictions Weight Bearing Restrictions: No      Mobility  Bed Mobility Overal bed mobility: Needs Assistance             General bed mobility comments: No bed mobility performed due patient medical status. Patient reports total assist at baseline.    Transfers Overall transfer level: Needs assistance                 General transfer comment: Transfer not appropriate due to patient status. Reports at baseline transfers with hoyer lift to [place] lift.    Ambulation/Gait               General Gait Details: Patient does not ambulate at baseline.  Stairs            Wheelchair Mobility    Modified Rankin (Stroke Patients Only)       Balance Overall balance assessment: Needs assistance   Sitting balance-Leahy Scale: Zero Sitting balance - Comments: Not able to attempt due to patient status.     Standing balance-Leahy Scale: Zero Standing balance comment: Not attempted due to patien status.                             Pertinent Vitals/Pain Pain Assessment Pain Assessment: No/denies pain    Home Living Family/patient expects to be discharged to:: Skilled nursing facility                        Prior  Function Prior Level of Function : Needs assist       Physical Assist : Mobility (physical);ADLs (physical) Mobility (physical): Bed mobility;Transfers;Gait;Stairs ADLs (physical): Grooming;Bathing;Dressing;Toileting;IADLs Mobility Comments: Patient used w/c in LTC. Transfers performed with [facility] lift. ADLs Comments: Recieved care at an LTC and ADL's met by staff.     Hand Dominance        Extremity/Trunk Assessment   Upper Extremity Assessment Upper Extremity Assessment: Generalized weakness     Lower Extremity Assessment Lower Extremity Assessment: Generalized weakness       Communication   Communication: Other (comment) (difficulty speaking.)  Cognition Arousal/Alertness: Awake/alert Behavior During Therapy: Flat affect, WFL for tasks assessed/performed Overall Cognitive Status: Within Functional Limits for tasks assessed                                          General Comments      Exercises Low Level/ICU Exercises Ankle Circles/Pumps: AROM, Both, 5 reps, Supine Shoulder Flexion: AROM, Both, 5 reps, Supine   Assessment/Plan    PT Assessment All further PT needs can be met in the next venue of care  PT Problem List Decreased strength;Decreased range of motion;Decreased activity tolerance;Decreased balance;Decreased mobility;Obesity       PT Treatment Interventions      PT Goals (Current goals can be found in the Care Plan section)  Acute Rehab PT Goals Patient Stated Goal: to feel better. PT Goal Formulation: With patient Time For Goal Achievement: 06/02/23 Potential to Achieve Goals: Poor    Frequency       Co-evaluation               AM-PAC PT "6 Clicks" Mobility  Outcome Measure Help needed turning from your back to your side while in a flat bed without using bedrails?: Total Help needed moving from lying on your back to sitting on the side of a flat bed without using bedrails?: Total Help needed moving to and from a bed to a chair (including a wheelchair)?: Total Help needed standing up from a chair using your arms (e.g., wheelchair or bedside chair)?: Total Help needed to walk in hospital room?: Total Help needed climbing 3-5 steps with a railing? : Total 6 Click Score: 6    End of Session Equipment Utilized During Treatment: Oxygen Activity Tolerance: Patient limited by fatigue Patient left: in bed;with bed alarm set;with call bell/phone within reach Nurse Communication: Mobility status PT Visit Diagnosis: Muscle  weakness (generalized) (M62.81);Other abnormalities of gait and mobility (R26.89)    Time: 1440-1450 PT Time Calculation (min) (ACUTE ONLY): 10 min   Charges:              Malachi Carl, SPT   Malachi Carl 05/19/2023, 4:17 PM

## 2023-05-19 NOTE — Progress Notes (Addendum)
Progress Note  Patient Name: Sonya Sanchez Date of Encounter: 05/19/2023  Primary Cardiologist: Mariah Milling  Subjective   Extubated and on CPAP. Developed Afib around 10:27 on 6/19, placed on IV amiodarone (on Eliquis, did not receive 6/16 - was in sinus rhythm at that time). Lasix stopped yesterday. Remains on phenylephrine with systolic BP in the 80s to low 161W mmHg. On vanc/cefepime.   Inpatient Medications    Scheduled Meds:  apixaban  5 mg Oral BID   atorvastatin  40 mg Oral Daily   Chlorhexidine Gluconate Cloth  6 each Topical Daily   fluticasone furoate-vilanterol  1 puff Inhalation Daily   insulin aspart  0-20 Units Subcutaneous Q4H   insulin glargine-yfgn  20 Units Subcutaneous Daily   ipratropium-albuterol  3 mL Nebulization Q4H   methylPREDNISolone (SOLU-MEDROL) injection  40 mg Intravenous Q12H   mouth rinse  15 mL Mouth Rinse Q2H   mouth rinse  15 mL Mouth Rinse 4 times per day   pantoprazole (PROTONIX) IV  40 mg Intravenous Daily   polyethylene glycol  17 g Per Tube Daily   sennosides  10 mL Per Tube BID   sodium chloride flush  3 mL Intravenous Q12H   Continuous Infusions:  amiodarone 30 mg/hr (05/19/23 0848)   ceFEPime (MAXIPIME) IV 2 g (05/19/23 0841)   magnesium sulfate bolus IVPB     phenylephrine (NEO-SYNEPHRINE) Adult infusion 40 mcg/min (05/19/23 0802)   vancomycin     PRN Meds: acetaminophen **OR** acetaminophen, mouth rinse, mouth rinse, polyethylene glycol, senna-docusate   Vital Signs    Vitals:   05/19/23 0720 05/19/23 0722 05/19/23 0730 05/19/23 0745  BP:    (!) 86/33  Pulse:  73  63  Resp:  15  19  Temp:   97.9 F (36.6 C)   TempSrc:   Axillary   SpO2: 96% 96%  97%  Weight:      Height:        Intake/Output Summary (Last 24 hours) at 05/19/2023 0855 Last data filed at 05/19/2023 0802 Gross per 24 hour  Intake 1658.48 ml  Output 1050 ml  Net 608.48 ml   Filed Weights   05/17/23 0500 05/18/23 0440 05/19/23 0417  Weight: 125.5 kg  126.3 kg 126.2 kg    Telemetry    Developed Afib around 10:27 AM on 6/19 with rates currently in the 60s to 70s bpm, rare PVC vs aberrancy - Personally Reviewed  ECG    No new tracings - Personally Reviewed  Physical Exam   GEN: No acute distress.   Neck: JVD difficult to assess secondary to body habitus. Cardiac: IRIR, no murmurs, rubs, or gallops.  Respiratory: Diminished and coarse breath sounds bilaterally.  GI: Soft, nontender, non-distended.   MS: 1+ bilateral lower extremity edema; No deformity. Neuro:  Alert and oriented x 3; Nonfocal.  Psych: Normal affect.  Labs    Chemistry Recent Labs  Lab 05/17/23 0428 05/18/23 0355 05/19/23 0413  NA 134* 134* 136  K 4.1 3.8 4.1  CL 96* 98 99  CO2 19* 22 23  GLUCOSE 223* 309* 306*  BUN 100* 95* 100*  CREATININE 1.98* 1.93* 1.75*  CALCIUM 7.8* 7.6* 7.8*  PROT 6.3* 6.1* 5.8*  ALBUMIN 2.9* 2.7* 2.4*  2.5*  AST 11* 12* 10*  ALT 8 9 9   ALKPHOS 49 53 49  BILITOT 1.3* 0.8 0.7  GFRNONAA 27* 28* 31*  ANIONGAP 19* 14 14     Hematology Recent Labs  Lab 05/17/23 0428 05/18/23  0355 05/19/23 0413  WBC 14.2* 12.2* 7.1  RBC 3.44* 3.35* 3.10*  HGB 10.2* 10.0* 9.1*  HCT 29.7* 29.5* 27.6*  MCV 86.3 88.1 89.0  MCH 29.7 29.9 29.4  MCHC 34.3 33.9 33.0  RDW 19.2* 19.3* 19.5*  PLT 192 158 154    Cardiac EnzymesNo results for input(s): "TROPONINI" in the last 168 hours. No results for input(s): "TROPIPOC" in the last 168 hours.   BNPNo results for input(s): "BNP", "PROBNP" in the last 168 hours.   DDimer No results for input(s): "DDIMER" in the last 168 hours.   Radiology    DG Chest Port 1 View  Result Date: 05/18/2023 IMPRESSION: No active disease. Electronically Signed   By: Elige Ko M.D.   On: 05/18/2023 11:44    Cardiac Studies   LHC 05/04/2023: Conclusions: Severe single-vessel coronary artery disease with chronic total occlusion of distal RCA with PDA and PL branches filling via left-to-right collaterals.   There is 70-80% stenosis of small-moderate first diagonal branch that is not well-suited to PCI. Mildly elevated left ventricular filling pressure (LVEDP 20 mmHg). Moderate aortic valve stenosis (mean gradient 24 mmHg). Small/stenotic right brachial veins and absent cephalic/basilic veins, not suitable for right heart catheterization.   Recommendations: Restart gentle diuresis. Aggressive secondary prevention of coronary artery disease. Restart apixaban 5 mg twice daily tomorrow morning if no evidence of bleeding/vascular injury. _________  2D echo 05/01/2023: 1. Left ventricular ejection fraction, by estimation, is 60 to 65%. The  left ventricle has normal function. The left ventricle has no regional  wall motion abnormalities. There is mild concentric left ventricular  hypertrophy. Left ventricular diastolic  parameters are consistent with Grade II diastolic dysfunction  (pseudonormalization). Elevated left ventricular end-diastolic pressure.   2. Right ventricular systolic function is normal. The right ventricular  size is normal. There is mildly elevated pulmonary artery systolic  pressure.   3. Left atrial size was moderately dilated.   4. The mitral valve is degenerative. No evidence of mitral valve  regurgitation. Mild mitral stenosis. The mean mitral valve gradient is 7.0  mmHg with average heart rate of 73 bpm. Moderate mitral annular  calcification.   5. The aortic valve is tricuspid. There is mild calcification of the  aortic valve. There is mild thickening of the aortic valve. Aortic valve  regurgitation is not visualized. Mild aortic valve stenosis. Aortic valve  area, by VTI measures 1.08 cm.  Aortic valve mean gradient measures 10.0 mmHg. Aortic valve Vmax measures  2.13 m/s.   6. The inferior vena cava is dilated in size with <50% respiratory  variability, suggesting right atrial pressure of 15 mmHg.   Comparison(s): EF 60-65.   Patient Profile     67 y.o.  female with history of CAD with CTO of the distal RCA, HFpEF, mild to moderate aortic stenosis, normocytic anemia, DVT/PE on Eliquis, DM2, obesity with OSA/OHS and dermatomyositis who we are seeing for evaluation of acute on chronic HFpEF that required mechanical ventilation, now extubated on 05/18/23.   Assessment & Plan    1. Acute on chronic hypoxic and hypercapnic respiratory failure: -Multifactorial including HFpEF, RLL PNA, and OSA/OHS -Now extubated, on CPAP -IV ABX per CCM -CCM following  2. Acute on chronic HFpEF: -Volume status difficult to assess secondary to body habitus -After discussion with CCM, will give a one-time dose of IV Lasix 60 mg following extubation  -May not be a good candidate for SGLT2i given body habitus -Consider addition of MRA moving forward as  renal function allows  3. CAD involving the native coronary arteries with NSTEMI: -High sensitivity troponin peaking at 1678, with subsequent down trend -Eliquis in place of ASA -Echo with preserved LVSF and normal wall motion -LHC this admission with severe single-vessel coronary artery disease with chronic total occlusion of distal RCA with PDA and PL branches filling via left-to-right collaterals. There was 70-80% stenosis of small-moderate first diagonal branch that is not well-suited to PCI. Medical therapy advised  -Not on a beta blocker given hypotension -Statin as below -Cardiac rehab  4. New onset Afib: -Remains in Afib with controlled ventricular response -Developed Afib around 10:27 on 6/19 -PTA on Eliquis for history of DVT/PE -CHADS2VASc at least 5 (CHF, age x 1, DM, vascular disease, sex category) -Eliquis 5 mg bid, dose not meet reduced dosing criteria  -Rate control options are limited secondary to hypotension requiring vasopressor support and AKI -IV amiodarone for rate control and possible pharmacologic cardioversion -No indication for emergent DCCV at this time as her BP is stable and was soft  prior to development of Afib and with low likelihood of maintaining sinus rhythm at this time in the setting of acute illness -TSH this admission mildly elevated -Potassium at goal, magnesium normal  5. Aortic stenosis: -Mild aortic stenosis by echo this admission -Follow up as outpatient   5. AKI: -Felt to be secondary to ATN/shock -Renal function improving following the holding of Lasix on 6/19 -Monitor with one-time dose of Lasix today  6. HLD: -LDL 129 this admission with goal being < 55 -Titrate Lipitor to 80 mg  -Follow up fasting lipid panel and LFT in the office in 2 months       For questions or updates, please contact [facility] Please consult [email or web address] for contact info under Cardiology/STEMI.    Signed, Eula Listen, PA-C [facility] Pager: [phone] 05/19/2023, 8:55 AM

## 2023-05-19 NOTE — Consult Note (Addendum)
PHARMACY CONSULT NOTE  Pharmacy Consult for Electrolyte Monitoring and Replacement   Recent Labs: Potassium (mmol/L)  Date Value  05/19/2023 4.1   Magnesium (mg/dL)  Date Value  16/08/9603 1.8   Calcium (mg/dL)  Date Value  54/07/8118 7.8 (L)   Albumin (g/dL)  Date Value  14/78/2956 2.5 (L)  05/19/2023 2.4 (L)  01/09/2018 4.0   Phosphorus (mg/dL)  Date Value  21/30/8657 6.8 (H)   Sodium (mmol/L)  Date Value  05/19/2023 136  01/09/2018 138   Assessment: Patient is a 67 y/o F with medical history including morbid obesity, limited functional capacity, nursing home resident admitted with altered mentation, AKI, acute CHF. Pharmacy consulted to assist with electrolyte monitoring and replacement as indicated.   Goal of Therapy:  Potassium >4, Mg >2  Plan:  --Replace magnesium 2g IV with new AF RVR --Follow-up electrolytes with AM labs tomorrow  Marygrace Drought 05/19/2023 8:04 AM

## 2023-05-19 NOTE — Progress Notes (Signed)
[facility]  PROGRESS NOTE   Subjective:   Remains in ICU  Bipap support Amio and phenylephrine in place   Creatinine 1.75 Foley catheter- UOP 1.5L  Objective:  Vital signs: Blood pressure (!) 86/33, pulse 63, temperature 97.9 F (36.6 C), temperature source Axillary, resp. rate 19, height 5' 2.01" (1.575 m), weight 126.2 kg, SpO2 98 %.  Intake/Output Summary (Last 24 hours) at 05/19/2023 1001 Last data filed at 05/19/2023 0802 Gross per 24 hour  Intake 1509.85 ml  Output 1050 ml  Net 459.85 ml    Filed Weights   05/17/23 0500 05/18/23 0440 05/19/23 0417  Weight: 125.5 kg 126.3 kg 126.2 kg     Physical Exam: General:  No acute distress  Head:  Normocephalic, atraumatic.   Eyes:  Anicteric  Lungs:  Rhonchi, Bipap  Heart:  S1S2 no rubs  Abdomen:   Soft, nontender, bowel sounds present  Extremities: No peripheral edema.  Neurologic: Somnolent   Skin:  No lesions  Access: None    Basic Metabolic Panel: Recent Labs  Lab 05/15/23 0550 05/15/23 0551 05/16/23 0357 05/17/23 0428 05/18/23 0355 05/19/23 0413  NA  --  131* 135 134* 134* 136  K  --  4.8 4.2 4.1 3.8 4.1  CL  --  95* 98 96* 98 99  CO2  --  22 20* 19* 22 23  GLUCOSE  --  115* 127* 223* 309* 306*  BUN  --  123* 107* 100* 95* 100*  CREATININE  --  2.75* 2.05* 1.98* 1.93* 1.75*  CALCIUM  --  7.1* 7.2* 7.8* 7.6* 7.8*  MG 1.7  --  2.1 1.8  --  1.8  PHOS  --   --   --  6.6*  --  6.8*    GFR: Estimated Creatinine Clearance: 38.6 mL/min (A) (by C-G formula based on SCr of 1.75 mg/dL (H)).  Liver Function Tests: Recent Labs  Lab 05/14/23 1516 05/16/23 0357 05/17/23 0428 05/18/23 0355 05/19/23 0413  AST 11* 12* 11* 12* 10*  ALT 10 10 8 9 9   ALKPHOS 45 45 49 53 49  BILITOT 0.7 1.2 1.3* 0.8 0.7  PROT 6.2* 6.0* 6.3* 6.1* 5.8*  ALBUMIN 3.3* 2.9* 2.9* 2.7* 2.4*  2.5*    No results for input(s): "LIPASE", "AMYLASE" in the last 168 hours. No results for input(s): "AMMONIA" in the last  168 hours.  CBC: Recent Labs  Lab 05/13/23 2259 05/14/23 0202 05/15/23 0550 05/16/23 0357 05/17/23 0428 05/18/23 0355 05/19/23 0413  WBC 6.0   < > 7.9 9.2 14.2* 12.2* 7.1  NEUTROABS 4.9  --   --  7.6  --   --   --   HGB 9.6*   < > 8.9* 9.1* 10.2* 10.0* 9.1*  HCT 29.7*   < > 27.1* 27.1* 29.7* 29.5* 27.6*  MCV 92.2   < > 88.3 88.6 86.3 88.1 89.0  PLT 207   < > 190 191 192 158 154   < > = values in this interval not displayed.      HbA1C: Hgb A1c MFr Bld  Date/Time Value Ref Range Status  04/30/2023 05:11 AM 9.1 (H) 4.8 - 5.6 % Final    Comment:    (NOTE)         Prediabetes: 5.7 - 6.4         Diabetes: >6.4         Glycemic control for adults with diabetes: <7.0   05/24/2019 09:29 AM 10.4 (H) 4.6 -  6.5 % Final    Comment:    Glycemic Control Guidelines for People with Diabetes:Non Diabetic:  <6%Goal of Therapy: <7%Additional Action Suggested:  >8%     Urinalysis: Recent Labs    05/17/23 0754  COLORURINE YELLOW*  LABSPEC 1.009  PHURINE 5.0  GLUCOSEU 150*  HGBUR MODERATE*  BILIRUBINUR NEGATIVE  KETONESUR NEGATIVE  PROTEINUR 30*  NITRITE NEGATIVE  LEUKOCYTESUR TRACE*       Imaging: DG Chest Port 1 View  Result Date: 05/18/2023 CLINICAL DATA:  Acute respiratory failure EXAM: PORTABLE CHEST 1 VIEW COMPARISON:  05/17/2023 FINDINGS: Endotracheal tube with the tip 3.4 cm above the carina. Nasogastric tube coursing below the diaphragm. Hazy right lower lobe airspace disease concerning for atelectasis versus pneumonia. Likely trace right pleural effusion. No pneumothorax. Stable cardiomegaly. Dual lumen left jugular central venous catheter with the tip projecting over the SVC. No acute osseous abnormality. IMPRESSION: No active disease. Electronically Signed   By: Elige Ko M.D.   On: 05/18/2023 11:44     Medications:    amiodarone 30 mg/hr (05/19/23 0848)   ceFEPime (MAXIPIME) IV 2 g (05/19/23 0841)   magnesium sulfate bolus IVPB 2 g (05/19/23 0931)    phenylephrine (NEO-SYNEPHRINE) Adult infusion 50 mcg/min (05/19/23 0935)   vancomycin      apixaban  5 mg Oral BID   atorvastatin  40 mg Oral Daily   Chlorhexidine Gluconate Cloth  6 each Topical Daily   fluticasone furoate-vilanterol  1 puff Inhalation Daily   furosemide  60 mg Intravenous Once   insulin aspart  0-20 Units Subcutaneous Q4H   insulin glargine-yfgn  20 Units Subcutaneous Daily   ipratropium-albuterol  3 mL Nebulization Q4H   methylPREDNISolone (SOLU-MEDROL) injection  40 mg Intravenous Q12H   mouth rinse  15 mL Mouth Rinse Q2H   mouth rinse  15 mL Mouth Rinse 4 times per day   pantoprazole (PROTONIX) IV  40 mg Intravenous Daily   polyethylene glycol  17 g Per Tube Daily   sennosides  10 mL Per Tube BID   sodium chloride flush  3 mL Intravenous Q12H    Assessment/ Plan:     67 year old female with a PMHx of morbid obesity, obstructive sleep apnea, bedbound status for 3 years, type 2 diabetes, hypertension, coronary artery disease, congestive heart failure, recent history of cardiac catheterization on 6//6/24.  In the ED she was found to be lethargic with a blood pressure 113/50 and elevated creatinine and potassium.  She was given insulin and glucose and also was placed on sodium bicarbonate drip.  The ED attempted to place a Foley catheter but was unsuccessful.  She has been on losartan, metformin and spironolactone.     Principal Problem:   AKI (acute kidney injury) (HCC) Active Problems:   Essential hypertension   Pulmonary embolism (HCC)   OSA (obstructive sleep apnea)   Altered mental status   Hyperkalemia   Hyponatremia   Anemia   #1: Acute kidney injury: Patient probably has acute kidney injury for the last 1 week after the IV contrast.    Creatinine stable with good urine output  #2: Hyperkalemia: Patient received doses of insulin along with glucose and sodium bicarbonate in ED.   Resolved  #3: Hyponatremia: This is most likely secondary to dilutional  causes with volume overloaded.   Resolved  #4: Vent failure/respiratory acidosis: This may be secondary to CO2 narcosis. Remains on bipap   #5: Coronary artery disease/congestive heart failure/hypertension: Cardiology note appreciated.  Diuretics held yesterday. Will receive IV furosemide 60mg  once today   #6: Anemia: Most likely secondary to iron deficiency/acute kidney injury. Hgb 9.1     LOS: 5 [facility] 6/20/202410:01 AM

## 2023-05-19 NOTE — Progress Notes (Addendum)
NAME:  Sonya Sanchez, MRN:  [id number], DOB:  1956-06-04, LOS: 5 ADMISSION DATE:  05/13/2023, CONSULTATION DATE:  05/14/2023 REFERRING MD:  EDP, CHIEF COMPLAINT:   Altered mental status, acute hypoxic, hypercapnic respiratory failure  History of Present Illness:   67 yo F with obesity, from nursing home. She has as history of PE, DVT, aortic stenosis, HTN, chronic bronchitis, OSA, AKI, dermatomyositis, DM2, dyslipidemia, came in due to worsening renal function noted on bloodwork. She was noted to be lethargic, denied pain anywhere. She was found to have severe hypercapnic acidemia. She was placed on BIPAP. PCCM was consulted to due AMS lethargy on BIPAP.   Pertinent  Medical History    has a past medical history of Calculus of kidney, Diabetes mellitus without complication (HCC), DVT (deep venous thrombosis) (HCC), Family history of early CAD, Gout, unspecified, Heart murmur, Hyperlipidemia, Hypertension, Morbid obesity (HCC), Normal cardiac stress test, Phlebitis and thrombophlebitis of other deep vessels of lower extremities, Phlebitis and thrombophlebitis of other deep vessels of lower extremities, Pulmonary emboli (HCC), Spinal stenosis, unspecified region other than cervical, Tobacco abuse, Type II or unspecified type diabetes mellitus without mention of complication, uncontrolled, Unspecified sleep apnea, and Urinary tract infection, site not specified.   Significant Hospital Events: Including procedures, antibiotic start and stop dates in addition to other pertinent events   05/15/23 central line placed, nephrology, cardiology consult 05/16/2023-Lasix drip increased, metolazone added 6/18-started antibiotics due to increasing WBC count, pancultures sent 6/19 extubated.  Had respiratory distress and was put on BiPAP.  Started amnio for rapid A-fib.  Interim History / Subjective:   Lasix drip stopped by cardiology.  On BiPAP overnight ABG is improved.  Objective   Blood pressure (!) 86/33,  pulse 63, temperature 97.9 F (36.6 C), temperature source Axillary, resp. rate 19, height 5' 2.01" (1.575 m), weight 126.2 kg, SpO2 97 %.    Vent Mode: BIPAP;PCV FiO2 (%):  [30 %-70 %] 30 % Set Rate:  [10 bmp] 10 bmp PEEP:  [8 cmH20] 8 cmH20 Pressure Support:  [12 cmH20] 12 cmH20   Intake/Output Summary (Last 24 hours) at 05/19/2023 0902 Last data filed at 05/19/2023 0802 Gross per 24 hour  Intake 1544.85 ml  Output 1050 ml  Net 494.85 ml   Filed Weights   05/17/23 0500 05/18/23 0440 05/19/23 0417  Weight: 125.5 kg 126.3 kg 126.2 kg    Examination: Gen:      No acute distress, obese HEENT:  EOMI, sclera anicteric Neck:     No masses; no thyromegaly Lungs:    Clear to auscultation bilaterally; normal respiratory effort CV:         Regular rate and rhythm; no murmurs Abd:      + bowel sounds; soft, non-tender; no palpable masses, no distension Ext:    No edema; adequate peripheral perfusion Skin:      Warm and dry; no rash Neuro: Awake, responsive  Labs/imaging reviewed Significant for sodium 136, BUN/creatinine 100/1.75 AST 10, ALT 9 WBC 7.1, hemoglobin 9.1, platelets 154  Resolved Hospital Problem list    Assessment & Plan:  Acute on chronic diastolic heart failure Atrial fibrillation Lasix drip was put on hold by cardiology.  Will resume IV pushes of Lasix to make sure that she remains negative balance.  Give Lasix 60 mg x 1 today Continue amiodarone drip and anticoagulation with Eliquis Monitor CVP  AKI secondary to IV contrast, ATN Hyperkalemia-improved Creatinine is improving Monitor urine output  Sepsis.  Possible sources are hospital-acquired pneumonia  versus e coli urosepsis Right lower lobe infiltrate on chest x-ray Continue empiric coverage with Vanco Change cefepime to zosyn as E coli in urine is resistant to cefepime Wean Neo-Synephrine Procalcitonin is elevated.  Follow cultures  Acute on chronic hypoxic, hypercarbic respiratory failure secondary  to CHF History of OSA, morbid obesity Required BiPAP postextubation.  Will transition from BiPAP to high flow nasal cannula today morning. She will need to be on BiPAP at night and during sleep for history of severe OSA  Dermatomyositis Monitor CK Of azathioprine due to concern for sepsis.  H/O DVT, PE Continue Eliquis  Diabetes mellitus SSI coverage Increase Levemir dose  Best Practice (right click and "Reselect all SmartList Selections" daily)   Diet/type: tubefeeds DVT prophylaxis: DOAC GI prophylaxis: PPI Lines: Central line Foley:  N/A Code Status:  full code Last date of multidisciplinary goals of care discussion [] . Sister updated 6/19  Critical care time:    The patient is critically ill with multiple organ system failure and requires high complexity decision making for assessment and support, frequent evaluation and titration of therapies, advanced monitoring, review of radiographic studies and interpretation of complex data.   Critical Care Time devoted to patient care services, exclusive of separately billable procedures, described in this note is 45 minutes.   Chilton Greathouse MD [facility] See Amion for pager  If no response to pager , please call [phone] until 7pm After 7:00 pm call Elink  [phone] 05/19/2023, 9:02 AM

## 2023-05-19 NOTE — Evaluation (Signed)
Clinical/Bedside Swallow Evaluation Patient Details  Name: Sonya Sanchez MRN: [id number] Date of Birth: 16-May-1956  Today's Date: 05/19/2023 Time: SLP Start Time (ACUTE ONLY): 1550 SLP Stop Time (ACUTE ONLY): 1635 SLP Time Calculation (min) (ACUTE ONLY): 45 min  Past Medical History:  Past Medical History:  Diagnosis Date   Calculus of kidney    Diabetes mellitus without complication (HCC)    DVT (deep venous thrombosis) (HCC)    a. on Eliquis   Family history of early CAD    a. parents passing in their 96's from CAD   Gout, unspecified    Heart murmur    Hyperlipidemia    Hypertension    Morbid obesity (HCC)    Normal cardiac stress test    a. equivocal study, sig soft tissue artifact present, no chest discomfort or ECG changes, perfusion images suggest mod sized region of mild reversible perfusion defect. Findings may be 2/2 shifting soft tissue attenuation, but cannot r/o ischemia, EF 72%   Phlebitis and thrombophlebitis of other deep vessels of lower extremities    Phlebitis and thrombophlebitis of other deep vessels of lower extremities    Pulmonary emboli (HCC)    a. on Eliquis   Spinal stenosis, unspecified region other than cervical    Tobacco abuse    Type II or unspecified type diabetes mellitus without mention of complication, uncontrolled    Unspecified sleep apnea    Urinary tract infection, site not specified    Past Surgical History:  Past Surgical History:  Procedure Laterality Date   ABDOMINAL HYSTERECTOMY     hyperplasia of endometrium   ANKLE SURGERY     fracture s/p pin, right ankle   APPENDECTOMY     CHOLECYSTECTOMY     COLECTOMY     temporary colostomy, now reversed   INTESTINAL BYPASS     ovarian cyst ruptured, led to perforated intestine   LEFT HEART CATH AND CORONARY ANGIOGRAPHY N/A 05/04/2023   Procedure: LEFT HEART CATH AND CORONARY ANGIOGRAPHY;  Surgeon: Yvonne Kendall, MD;  Location: [facility] INVASIVE CV LAB;  Service: Cardiovascular;  Laterality:  N/A;   STOMACH SURGERY     HPI:  Pt is a 66 y.o. female who resides at area SNF with a hx of CAD, DM, DVT, acute renal failure, and morbid Obesity w/ worsening CHF and seen by Cardiology at admit the Hospital.  Per MD notes, pt has a long medical history of multiple issues who was admitted 2 weeks ago and underwent left heart cath by Dr. Okey Dupre. 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 She also has mod AS. Her LVEDP was elevated at 20. The patient was discharged home. She has returned to the hospital/ED. She was on bipap initially. Her CO2 was 60 6 hours ago. She is hypotensive, sleepy/somnolent, and has acute kidney injury and hyperkalemia per bloodwork. Post admit, pt required placement of an artificial airway secondary to Respiratory Failure. She was extubated on 05/18/23 s/p ~5 days of intubation.  CXR today: Endotracheal tube has been removed. Left lung is clear.  Significantly decreased right basilar opacity is noted suggesting  improving pneumonia or atelectasis.  Prior CXR in 03/2023: Enlarged heart with vascular congestion and trace edema. Small left effusion.    Assessment / Plan / Recommendation  Clinical Impression   Pt seen for BSE today. Pt awake, verbal w/ hoarse/gravely vocal quality -- extubated yesterday, 05/18/23 post ~  5 days of oral intubation. Pt A/O x4. Weak and could not feed self. On HFNC O2 support post extubation; 40L. Afebrile, WBC wnl.  Pt has been coughing and expectorating moderate amounts of phlegm today per NSG.  Pt appears to present w/ potential pharyngeal phase dysphagia and did not pass the Yale swallow screen w/ NSG today -- suspect impact from lengthy oral intubation. Pt's vocal quality remains hoarse/gravely d/t suspected trauma. This presentation could impact the sensation and timing during swallowing.  Pt appears at increased risk for aspiration at this time and is  appropriate (only) for therapeutic trials of Applesauce w/ Supervision following general aspiration precautions.   Pt has challenging factors that could impact her oropharyngeal swallowing to include fatigue/weakness/deconditioning, declined Pulmonary status requiring HFNC O2 support currently, and dependency for feeding. These factors can increase risk for aspiration, dysphagia as well as decreased oral intake overall.   During po trials, pt consumed trials of single ice chips w/ congested coughing noted x1/6 tirals -- no O2 desaturations nor decline in respiratory status post recovery from coughing. W/ trials of tsps of Applesauce (puree), pt consumed ~10+ trials w/ no overt coughing, decline in vocal quality, or change in respiratory presentation during/post trials. Suspect the presence of increased texture/sensory input could have promoted an improved swallow response. Oral phase appeared [facility] w/ timely bolus management, mastication of an ice chip, and control of bolus propulsion for A-P transfer for swallowing. Oral clearing achieved w/ trials.  OM Exam appeared [facility] w/ no unilateral weakness noted. Speech Clear; vocal quality hoarse/gravely. Pt required feeding.   An oral diet is Not recommended at this time d/t increased risk for aspiration. Do recommend Therapeutic trials of small TSPs of Applesauce w/ NSG Supervision to engage oropharyngeal swallowing. General aspiration precautions and monitor respiratory status, Rest Breaks as needed during po intake. Per MD ok, Pills CRUSHED in Puree for safer, easier swallowing.  Education given on Pills in Puree; applesauce trials - therapeutic; general aspiration precautions to pt and NSG. Family arrived also at end of session. NSG updated, agreed. MD updated. Recommend Dietician f/u for support. ST services will continue to follow pt for ongoing assessment and trials to upgrade diet to least restrictive consistency. SLP Visit Diagnosis: Dysphagia,  oropharyngeal phase (R13.12) (s/p extubation 05/18/23)    Aspiration Risk  Mild aspiration risk;Moderate aspiration risk;Risk for inadequate nutrition/hydration    Diet Recommendation   An oral diet is Not recommended at this time d/t increased risk for aspiration. Do recommend Therapeutic trials of small TSPs of Applesauce w/ NSG Supervision to engage oropharyngeal swallowing. General aspiration precautions and monitor respiratory status, Rest Breaks as needed during po intake.   Medication Administration: Via alternative means (could consider Crushed in puree per MD ok)    Other  Recommendations Recommended Consults:  (Dietician f/u; Palliative Care f/u for GOC discussion) Oral Care Recommendations: Oral care BID;Oral care before and after PO;Staff/trained caregiver to provide oral care Caregiver Recommendations:  (TBD)    Recommendations for follow up therapy are one component of a multi-disciplinary discharge planning process, led by the attending physician.  Recommendations may be updated based on patient status, additional functional criteria and insurance authorization.  Follow up Recommendations Follow physician's recommendations for discharge plan and follow up therapies      Assistance Recommended at Discharge  full  Functional Status Assessment Patient has had a recent decline in their functional status and demonstrates the ability to make significant improvements in function in a reasonable and predictable  amount of time.  Frequency and Duration min 2x/week  2 weeks       Prognosis Prognosis for improved oropharyngeal function: Fair (-Good) Barriers to Reach Goals: Time post onset;Severity of deficits Barriers/Prognosis Comment: dedconditioned; Pulmonary decline      Swallow Study   General Date of Onset: 05/14/23 HPI: Pt is a 67 y.o. female who resides at area SNF with a hx of CAD, DM, DVT, acute renal failure, and morbid Obesity w/ worsening CHF and seen by Cardiology  at admit the Hospital.  Per MD notes, pt has a long medical history of multiple issues who was admitted 2 weeks ago and underwent left heart cath by Dr. Okey Dupre. 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 She also has mod AS. Her LVEDP was elevated at 20. The patient was discharged home. She has returned to the hospital/ED. She was on bipap initially. Her CO2 was 60 6 hours ago. She is hypotensive, sleepy/somnolent, and has acute kidney injury and hyperkalemia per bloodwork. Post admit, pt required placement of an artificial airway secondary to Respiratory Failure. She was extubated on 05/18/23 s/p ~5 days of intubation.  CXR today: Endotracheal tube has been removed. Left lung is clear.  Significantly decreased right basilar opacity is noted suggesting  improving pneumonia or atelectasis.  Prior CXR in 03/2023: Enlarged heart with vascular congestion and trace edema. Small left effusion. Type of Study: Bedside Swallow Evaluation Previous Swallow Assessment: none Diet Prior to this Study: NPO Temperature Spikes Noted: No (wbc 7.1) Respiratory Status: Nasal cannula (HFNC - 40L) History of Recent Intubation: Yes Total duration of intubation (days): 5 days Date extubated: 05/18/23 Behavior/Cognition: Alert;Cooperative;Pleasant mood Oral Cavity Assessment: Dry Oral Care Completed by SLP: Yes Oral Cavity - Dentition: Missing dentition Vision:  (n/a) Self-Feeding Abilities: Total assist (weak) Patient Positioning: Upright in bed (needed positioning) Baseline Vocal Quality: Hoarse;Low vocal intensity (gravely - s/p extubation 05/18/23) Volitional Cough: Congested Volitional Swallow: Able to elicit    Oral/Motor/Sensory Function Overall Oral Motor/Sensory Function: Within functional limits   Ice Chips Ice chips: Impaired Presentation: Spoon (fed; 6 trials) Oral Phase Impairments:  (wfl) Pharyngeal Phase  Impairments: Cough - Immediate (x1/6; congested coughing) Other Comments: NSG reported pt failed the Yale swallow screen   Thin Liquid Thin Liquid: Not tested    Nectar Thick Nectar Thick Liquid: Not tested   Honey Thick Honey Thick Liquid: Not tested   Puree Puree: Within functional limits Presentation: Spoon (fed; 10 trials)   Solid     Solid: Not tested        Jerilynn Som, MS, CCC-SLP Speech Language Pathologist Rehab Services; [facility] [phone] (ascom) Johnatha Zeidman 05/19/2023,5:58 PM

## 2023-05-20 DIAGNOSIS — I2699 Other pulmonary embolism without acute cor pulmonale: Secondary | ICD-10-CM | POA: Diagnosis not present

## 2023-05-20 DIAGNOSIS — I5032 Chronic diastolic (congestive) heart failure: Secondary | ICD-10-CM

## 2023-05-20 DIAGNOSIS — G4733 Obstructive sleep apnea (adult) (pediatric): Secondary | ICD-10-CM | POA: Diagnosis not present

## 2023-05-20 DIAGNOSIS — R4182 Altered mental status, unspecified: Secondary | ICD-10-CM | POA: Diagnosis not present

## 2023-05-20 DIAGNOSIS — N179 Acute kidney failure, unspecified: Secondary | ICD-10-CM | POA: Diagnosis not present

## 2023-05-20 DIAGNOSIS — J189 Pneumonia, unspecified organism: Secondary | ICD-10-CM | POA: Diagnosis not present

## 2023-05-20 LAB — COOXEMETRY PANEL
Carboxyhemoglobin: 1.1 % (ref 0.5–1.5)
Methemoglobin: <0.7 % (ref 0.0–1.5)
O2 Saturation: 98 %
Total hemoglobin: 9.3 g/dL — ABNORMAL LOW (ref 12.0–16.0)
Total oxygen content: 96.5 %

## 2023-05-20 LAB — CBC
HCT: 27.6 % — ABNORMAL LOW (ref 36.0–46.0)
Hemoglobin: 8.8 g/dL — ABNORMAL LOW (ref 12.0–15.0)
MCH: 29 pg (ref 26.0–34.0)
MCHC: 31.9 g/dL (ref 30.0–36.0)
MCV: 91.1 fL (ref 80.0–100.0)
Platelets: 204 10*3/uL (ref 150–400)
RBC: 3.03 MIL/uL — ABNORMAL LOW (ref 3.87–5.11)
RDW: 19.5 % — ABNORMAL HIGH (ref 11.5–15.5)
WBC: 10 10*3/uL (ref 4.0–10.5)
nRBC: 0.3 % — ABNORMAL HIGH (ref 0.0–0.2)

## 2023-05-20 LAB — CULTURE, BLOOD (ROUTINE X 2): Special Requests: ADEQUATE

## 2023-05-20 LAB — BLOOD CULTURE ID PANEL (REFLEXED) - BCID2

## 2023-05-20 LAB — RENAL FUNCTION PANEL
Albumin: 2.5 g/dL — ABNORMAL LOW (ref 3.5–5.0)
Anion gap: 14 (ref 5–15)
BUN: 95 mg/dL — ABNORMAL HIGH (ref 8–23)
CO2: 22 mmol/L (ref 22–32)
Calcium: 8.2 mg/dL — ABNORMAL LOW (ref 8.9–10.3)
Chloride: 98 mmol/L (ref 98–111)
Creatinine, Ser: 1.73 mg/dL — ABNORMAL HIGH (ref 0.44–1.00)
GFR, Estimated: 32 mL/min — ABNORMAL LOW (ref >60)
Glucose, Bld: 242 mg/dL — ABNORMAL HIGH (ref 70–99)
Phosphorus: 6.2 mg/dL — ABNORMAL HIGH (ref 2.5–4.6)
Potassium: 4.3 mmol/L (ref 3.5–5.1)
Sodium: 134 mmol/L — ABNORMAL LOW (ref 135–145)

## 2023-05-20 LAB — GLUCOSE, CAPILLARY
Glucose-Capillary: 230 mg/dL — ABNORMAL HIGH (ref 70–99)
Glucose-Capillary: 209 mg/dL — ABNORMAL HIGH (ref 70–99)
Glucose-Capillary: 222 mg/dL — ABNORMAL HIGH (ref 70–99)
Glucose-Capillary: 233 mg/dL — ABNORMAL HIGH (ref 70–99)
Glucose-Capillary: 259 mg/dL — ABNORMAL HIGH (ref 70–99)
Glucose-Capillary: 222 mg/dL — ABNORMAL HIGH (ref 70–99)

## 2023-05-20 LAB — BASIC METABOLIC PANEL
Anion gap: 12 (ref 5–15)
BUN: 97 mg/dL — ABNORMAL HIGH (ref 8–23)
CO2: 23 mmol/L (ref 22–32)
Calcium: 7.8 mg/dL — ABNORMAL LOW (ref 8.9–10.3)
Chloride: 101 mmol/L (ref 98–111)
Creatinine, Ser: 1.66 mg/dL — ABNORMAL HIGH (ref 0.44–1.00)
GFR, Estimated: 33 mL/min — ABNORMAL LOW (ref >60)
Glucose, Bld: 241 mg/dL — ABNORMAL HIGH (ref 70–99)
Potassium: 4.2 mmol/L (ref 3.5–5.1)
Sodium: 136 mmol/L (ref 135–145)

## 2023-05-20 LAB — PROCALCITONIN: Procalcitonin: 3.23 ng/mL

## 2023-05-20 LAB — MAGNESIUM: Magnesium: 2.1 mg/dL (ref 1.7–2.4)

## 2023-05-20 MED ORDER — FUROSEMIDE 10 MG/ML IJ SOLN
60.0000 mg | Freq: Once | INTRAMUSCULAR | Status: AC
  Administered 2023-05-20: 60 mg via INTRAVENOUS
  Filled 2023-05-20: qty 6

## 2023-05-20 MED ORDER — CHLORHEXIDINE GLUCONATE CLOTH 2 % EX PADS
6.0000 | MEDICATED_PAD | Freq: Every day | CUTANEOUS | Status: DC
  Administered 2023-05-20 – 2023-05-27 (×8): 6 via TOPICAL

## 2023-05-20 MED ORDER — INSULIN GLARGINE-YFGN 100 UNIT/ML ~~LOC~~ SOLN
26.0000 [IU] | Freq: Two times a day (BID) | SUBCUTANEOUS | Status: DC
  Administered 2023-05-20 – 2023-05-21 (×3): 26 [IU] via SUBCUTANEOUS
  Filled 2023-05-20 (×4): qty 0.26

## 2023-05-20 MED ORDER — BISACODYL 10 MG RE SUPP
10.0000 mg | Freq: Once | RECTAL | Status: AC
  Administered 2023-05-20: 10 mg via RECTAL
  Filled 2023-05-20: qty 1

## 2023-05-20 MED ORDER — NEPRO/CARBSTEADY PO LIQD
237.0000 mL | Freq: Three times a day (TID) | ORAL | Status: DC
  Administered 2023-05-20 – 2023-05-23 (×7): 237 mL via ORAL

## 2023-05-20 MED ORDER — ADULT MULTIVITAMIN W/MINERALS CH
1.0000 | ORAL_TABLET | Freq: Every day | ORAL | Status: DC
  Administered 2023-05-21 – 2023-05-26 (×5): 1 via ORAL
  Filled 2023-05-20 (×6): qty 1

## 2023-05-20 NOTE — Progress Notes (Signed)
Speech Language Pathology Treatment: Dysphagia  Patient Details Name: Sonya Sanchez MRN: [id number] DOB: [birth year] Today's Date: 05/20/2023 Time: 2952-8413 SLP Time Calculation (min) (ACUTE ONLY): 45 min  Assessment / Plan / Recommendation Clinical Impression  Pt seen for ongoing assessment of swallowing today. Pt awake, verbal w/ hoarse/gravely vocal quality -- BUT much improved today. Extubated 05/18/23 post ~5 days of oral intubation. Pt A/O x4. Weak but could hold Cup to feed self. On HFNC O2 support post extubation; 40L. Afebrile, WBC wnl.  Pt has been coughing and expectorating less phlegm today per pt/NSG.   Pt appears to present w/ potential pharyngeal phase dysphagia c/b intermittent, clinical s/s of aspiration w/ thin liquids -- suspect impact from lengthy oral intubation. Pt's vocal quality remains hoarse/gravely d/t suspected trauma. This presentation could impact the sensation and timing during swallowing.  Pt appears at increased risk for aspiration at this time but was able to adequately tolerate trials of Nectar consistency liquids and purees following general aspiration precautions.    Pt has challenging factors that could impact her oropharyngeal swallowing to include fatigue/weakness/deconditioning, declined Pulmonary status requiring HFNC O2 support currently, and dependency for feeding. These factors can increase risk for aspiration, dysphagia as well as decreased oral intake overall.    During po trials, pt consumed trials of single ice chips, thin liquids, then Nectar liquids via Cup and purees. Mild, delayed throat clearing and cough noted w/ thin liquid trials -- pt stated it was "rough" when swallowing. W/ trials of Nectar liquids, she stated more comfort w/ the liquid consistency and no overt coughing, decline in vocal quality, nor change in respiratory presentation during/post trials(applesauce too). Suspect the presence of increased texture/sensory input could have promoted  an improved swallow response. Oral phase appeared [facility] w/ timely bolus management, mastication of an ice chip, and control of bolus propulsion for A-P transfer for swallowing. Oral clearing achieved w/ trials.    Recommend a trial diet of Dysphagia level 1 w/ Nectar liquids Via Cup; aspiration precautions. NSG Supervision to support feeding and monitor respiratory status, Rest Breaks as needed during po intake. Per MD ok, Pills CRUSHED in Puree for safer, easier swallowing.  Education given on Pills in Puree; general aspiration precautions; food and liquid consistencies to pt and NSG. NSG updated, agreed. MD updated. Recommend Dietician f/u for support. ST services will continue to follow pt for ongoing assessment and trials to upgrade diet to least restrictive consistency.       HPI HPI: Pt is a 67 y.o. female who resides at area SNF with a hx of CAD, DM, DVT, acute renal failure, and morbid Obesity w/ worsening CHF and seen by Cardiology at admit the Hospital.  Per MD notes, pt has a long medical history of multiple issues who was admitted 2 weeks ago and underwent left heart cath by Dr. Okey Dupre. 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 She also has mod AS. Her LVEDP was elevated at 20. The patient was discharged home. She has returned to the hospital/ED. She was on bipap initially. Her CO2 was 60 6 hours ago. She is hypotensive, sleepy/somnolent, and has acute kidney injury and hyperkalemia per bloodwork. Post admit, pt required placement of an artificial airway secondary to Respiratory Failure. She was extubated on 05/18/23 s/p ~5 days of intubation.  CXR today: Endotracheal tube has been removed. Left lung is clear.  Significantly decreased  right basilar opacity is noted suggesting  improving pneumonia or atelectasis.  Prior CXR in 03/2023: Enlarged heart with vascular congestion and trace edema. Small left  effusion.      SLP Plan  Continue with current plan of care      Recommendations for follow up therapy are one component of a multi-disciplinary discharge planning process, led by the attending physician.  Recommendations may be updated based on patient status, additional functional criteria and insurance authorization.    Recommendations  Diet recommendations: Dysphagia 1 (puree);Nectar-thick liquid Liquids provided via: Cup;No straw Medication Administration: Whole meds with puree (vs CRUSHED in puree) Supervision: Patient able to self feed;Staff to assist with self feeding;Full supervision/cueing for compensatory strategies Compensations: Minimize environmental distractions;Slow rate;Small sips/bites;Lingual sweep for clearance of pocketing;Follow solids with liquid Postural Changes and/or Swallow Maneuvers: Out of bed for meals;Seated upright 90 degrees;Upright 30-60 min after meal                 (Dietician f/u) Oral care BID;Oral care before and after PO;Staff/trained caregiver to provide oral care (support)   Frequent or constant Supervision/Assistance Dysphagia, pharyngeal phase (R13.13) (s/p extubation 05/18/23)     Continue with current plan of care        Jerilynn Som, MS, CCC-SLP Speech Language Pathologist Rehab Services; [facility] [phone] (ascom) Caretha Rumbaugh  05/20/2023, 6:55 PM

## 2023-05-20 NOTE — Progress Notes (Signed)
NAME:  Sonya Sanchez, MRN:  [id number], DOB:  Sep 26, 1956, LOS: 6 ADMISSION DATE:  05/13/2023, CONSULTATION DATE:  05/14/2023 REFERRING MD:  EDP, CHIEF COMPLAINT:   Altered mental status, acute hypoxic, hypercapnic respiratory failure  History of Present Illness:   67 yo F with obesity, from nursing home. She has as history of PE, DVT, aortic stenosis, HTN, chronic bronchitis, OSA, AKI, dermatomyositis, DM2, dyslipidemia, came in due to worsening renal function noted on bloodwork. She was noted to be lethargic, denied pain anywhere. She was found to have severe hypercapnic acidemia. She was placed on BIPAP. PCCM was consulted to due AMS lethargy on BIPAP.   Pertinent  Medical History    has a past medical history of Calculus of kidney, Diabetes mellitus without complication (HCC), DVT (deep venous thrombosis) (HCC), Family history of early CAD, Gout, unspecified, Heart murmur, Hyperlipidemia, Hypertension, Morbid obesity (HCC), Normal cardiac stress test, Phlebitis and thrombophlebitis of other deep vessels of lower extremities, Phlebitis and thrombophlebitis of other deep vessels of lower extremities, Pulmonary emboli (HCC), Spinal stenosis, unspecified region other than cervical, Tobacco abuse, Type II or unspecified type diabetes mellitus without mention of complication, uncontrolled, Unspecified sleep apnea, and Urinary tract infection, site not specified.   Significant Hospital Events: Including procedures, antibiotic start and stop dates in addition to other pertinent events   05/15/23 central line placed, nephrology, cardiology consult 05/16/2023-Lasix drip increased, metolazone added 6/18-started antibiotics due to increasing WBC count, pancultures sent 6/19 extubated.  Had respiratory distress and was put on BiPAP.  Started amnio for rapid A-fib. 6/20: lasix drip stopped.  Interim History / Subjective:   On BiPAP overnight ABG is improved. Remains on 40%/40L HFNC  Objective   Blood  pressure (!) 86/33, pulse 64, temperature 98.6 F (37 C), temperature source Oral, resp. rate 15, height 5' 2.01" (1.575 m), weight 124.4 kg, SpO2 95 %. CVP:  [7 mmHg-17 mmHg] 13 mmHg  FiO2 (%):  [30 %-40 %] 40 % PEEP:  [8 cmH20] 8 cmH20 Pressure Support:  [10 cmH20] 10 cmH20   Intake/Output Summary (Last 24 hours) at 05/20/2023 1157 Last data filed at 05/20/2023 1102 Gross per 24 hour  Intake 1804.18 ml  Output 1250 ml  Net 554.18 ml   Filed Weights   05/18/23 0440 05/19/23 0417 05/20/23 0500  Weight: 126.3 kg 126.2 kg 124.4 kg    Examination: Gen:      No acute distress, obese HEENT:  EOMI, sclera anicteric Neck:     No masses; no thyromegaly Lungs:    Clear to auscultation bilaterally; normal respiratory effort CV:         Regular rate and rhythm; no murmurs Abd:      + bowel sounds; soft, non-tender; no palpable masses, no distension Ext:    No edema; adequate peripheral perfusion Skin:      Warm and dry; no rash Neuro: Awake, responsive  Labs/imaging reviewed  Resolved Hospital Problem list    Assessment & Plan:  Acute on chronic diastolic heart failure Atrial fibrillation Lasix drip was put on hold by cardiology.  Will resume IV pushes of Lasix to make sure that she remains negative balance.   CVP remains elevated; lasix additional dose this am Appreciate cards eval and recs Continue amiodarone drip and anticoagulation with Eliquis Monitor CVP  AKI secondary to IV contrast, ATN Hyperkalemia-improved Creatinine is improving Monitor urine output  Sepsis.  Possible sources are hospital-acquired pneumonia versus e coli urosepsis Right lower lobe infiltrate on chest x-ray MRSA  positive- continue vancomycin Continue zosyn as E coli in urine is resistant to cefepime Continue Neo-Synephrine Procalcitonin is elevated.  Follow cultures  Acute on chronic hypoxic, hypercarbic respiratory failure secondary to CHF History of OSA, morbid obesity Required BiPAP  postextubation.  Will transition from BiPAP to high flow nasal cannula today morning. She will need to be on BiPAP at night and during sleep for history of severe OSA  Dermatomyositis Monitor CK Of azathioprine due to concern for sepsis.  H/O DVT, PE Continue Eliquis  Diabetes mellitus SSI coverage Hyperglycemia remains a  challenge Lantus 26u BID  Best Practice (right click and "Reselect all SmartList Selections" daily)   Diet/type: tubefeeds DVT prophylaxis: DOAC GI prophylaxis: PPI Lines: Central line Foley:  N/A Code Status:  full code Last date of multidisciplinary goals of care discussion [] . Sister updated 6/19  Critical care time:    The patient is critically ill with multiple organ system failure and requires high complexity decision making for assessment and support, frequent evaluation and titration of therapies, advanced monitoring, review of radiographic studies and interpretation of complex data.   Critical Care Time devoted to patient care services, exclusive of separately billable procedures, described in this note is 45 minutes.   Rhea Bleacher MD Pulmonary & Critical Care Medicine

## 2023-05-20 NOTE — Consult Note (Signed)
Pharmacy Antibiotic Note  Sonya Sanchez is Sonya 67 y.o. female admitted on 05/13/2023 from nursing home with worsening renal function on blood work. She was noted to be lethargic, denied pain anywhere. She was found to have severe hypercapnic acidemia. She was placed on BIPAP. PCCM was consulted to due AMS lethargy on BIPAP. Pattient's white count continues to increase with possible HCAP vs urosepsis.  Tracheal aspirate growing MRSA and Urine culture growing E.coli(ESBL) . Patient currently on Zosyn and pharmacy has been consulted for Vancomycin dosing.  Patient's initial baseline serum creatinine prior to AKI developing was near 1. Was receiving lasix gtt@10mg /hr but now stopped and being assessed daily for diuresis. Scr continues to improve and potential that new baseline is~1.7-2. Creatinine remains stable; vancomycin dosed based on AUC. WBC improving and is currently afebrile. Vital signs stable.  Plan: 1) Cotninue vancomycin 1500 mg IV Q 48 hrs. Goal AUC 400-550. Expected AUC: 491.2 SCr used: 1.73 Will obtain peak/trough at steady state if continued    Height: 5' 2.01" (157.5 cm) Weight: 124.4 kg (274 lb 4 oz) IBW/kg (Calculated) : 50.12  Temp (24hrs), Avg:98 F (36.7 C), Min:97.4 F (36.3 C), Max:98.6 F (37 C)  Recent Labs  Lab 05/16/23 0357 05/17/23 0428 05/17/23 0755 05/18/23 0355 05/19/23 0413 05/20/23 0421  WBC 9.2 14.2*  --  12.2* 7.1 10.0  CREATININE 2.05* 1.98*  --  1.93* 1.75* 1.66*  1.73*  LATICACIDVEN  --   --  1.0  --   --   --      Estimated Creatinine Clearance: 38.7 mL/min (Sonya) (by C-G formula based on SCr of 1.73 mg/dL (H)).    Allergies  Allergen Reactions   Sulfa Antibiotics Other (See Comments)    Reaction: isn't certain, thinks she ran Sonya fever, or had Sonya rash, maybe both.    Antimicrobials this admission: Vancomycin 6/18 >>  Cefepime 6/18 >>   Dose adjustments this admission: N/Sonya  Microbiology results: 6/18 BCx: NGTD 6/18 UCx:E.coli(ESBL) 6/18  Trach cx: MRSA 6/15 MRSA PCR: positive 6/15 Resp cx: NG  Thank you for allowing pharmacy to be Sonya part of this patient's care.  Sonya Sanchez Sonya Sanchez 05/20/2023 3:22 PM

## 2023-05-20 NOTE — Consult Note (Signed)
PHARMACY - PHYSICIAN COMMUNICATION CRITICAL VALUE ALERT - BLOOD CULTURE IDENTIFICATION (BCID)  Sonya Sanchez is an 67 y.o. female who presented to [facility] on 05/13/2023 with a chief complaint of abdominal lab.   Assessment:  1 out of 4 bottles growing GPC. BCID positive for staph epi mecA positive  Name of physician (or Provider) ContactedAnna Genre NP  Current antibiotics: pip/tazo and vancomycin  Changes to prescribed antibiotics recommended:  Patient is on recommended antibiotics - No changes needed  Results for orders placed or performed during the hospital encounter of 05/13/23  Blood Culture ID Panel (Reflexed) (Collected: 05/17/2023  9:28 AM)  Result Value Ref Range   Enterococcus faecalis NOT DETECTED NOT DETECTED   Enterococcus Faecium NOT DETECTED NOT DETECTED   Listeria monocytogenes NOT DETECTED NOT DETECTED   Staphylococcus species DETECTED (A) NOT DETECTED   Staphylococcus aureus (BCID) NOT DETECTED NOT DETECTED   Staphylococcus epidermidis DETECTED (A) NOT DETECTED   Staphylococcus lugdunensis NOT DETECTED NOT DETECTED   Streptococcus species NOT DETECTED NOT DETECTED   Streptococcus agalactiae NOT DETECTED NOT DETECTED   Streptococcus pneumoniae NOT DETECTED NOT DETECTED   Streptococcus pyogenes NOT DETECTED NOT DETECTED   A.calcoaceticus-baumannii NOT DETECTED NOT DETECTED   Bacteroides fragilis NOT DETECTED NOT DETECTED   Enterobacterales NOT DETECTED NOT DETECTED   Enterobacter cloacae complex NOT DETECTED NOT DETECTED   Escherichia coli NOT DETECTED NOT DETECTED   Klebsiella aerogenes NOT DETECTED NOT DETECTED   Klebsiella oxytoca NOT DETECTED NOT DETECTED   Klebsiella pneumoniae NOT DETECTED NOT DETECTED   Proteus species NOT DETECTED NOT DETECTED   Salmonella species NOT DETECTED NOT DETECTED   Serratia marcescens NOT DETECTED NOT DETECTED   Haemophilus influenzae NOT DETECTED NOT DETECTED   Neisseria meningitidis NOT DETECTED NOT DETECTED   Pseudomonas  aeruginosa NOT DETECTED NOT DETECTED   Stenotrophomonas maltophilia NOT DETECTED NOT DETECTED   Candida albicans NOT DETECTED NOT DETECTED   Candida auris NOT DETECTED NOT DETECTED   Candida glabrata NOT DETECTED NOT DETECTED   Candida krusei NOT DETECTED NOT DETECTED   Candida parapsilosis NOT DETECTED NOT DETECTED   Candida tropicalis NOT DETECTED NOT DETECTED   Cryptococcus neoformans/gattii NOT DETECTED NOT DETECTED   Methicillin resistance mecA/C DETECTED (A) NOT DETECTED    Ronnald Ramp 05/20/2023  9:05 PM

## 2023-05-20 NOTE — Inpatient Diabetes Management (Signed)
Inpatient Diabetes Program Recommendations  AACE/ADA: New Consensus Statement on Inpatient Glycemic Control   Target Ranges:  Prepandial:   less than 140 mg/dL      Peak postprandial:   less than 180 mg/dL (1-2 hours)      Critically ill patients:  140 - 180 mg/dL    Latest Reference Range & Units 05/19/23 07:27 05/19/23 11:11 05/19/23 15:12 05/19/23 19:57 05/19/23 23:19 05/20/23 03:54 05/20/23 08:48  Glucose-Capillary 70 - 99 mg/dL 098 (H) 119 (H) 147 (H) 247 (H) 230 (H) 230 (H) 209 (H)    Review of Glycemic Control  Diabetes history: DM2 Outpatient Diabetes medications: Basaglar 40 units at bedtime, Humalog 6 units TID, Jardiance 10 mg daily, Ozempic 0.25 mg Qweek Current orders for Inpatient glycemic control: Semglee 20 units BID, Novolog 0-20 units Q4H; Solumedrol 40 mg Q12H; NPO  Inpatient Diabetes Program Recommendations:    Insulin: If Solumedrol continued as ordered, please consider increasing Semglee to 28 units BID.  Thanks, Orlando Penner, RN, MSN, CDCES Diabetes Coordinator Inpatient Diabetes Program [phone] (Team Pager from 8am to 5pm)'

## 2023-05-20 NOTE — Progress Notes (Signed)
Progress Note  Patient Name: Sonya Sanchez Date of Encounter: 05/20/2023  Primary Cardiologist: Mariah Milling  Subjective   Transitioned from BiPAP to HFNC. Developed Afib around 10:27 on 6/19, placed on IV amiodarone (on Eliquis, did not receive 6/16 - was in sinus rhythm at that time), remains in Afib with ventricular rates in the 70s to 80s bpm. Remains on phenylephrine with systolic BP in the 80s to low 161W mmHg. No chest pain.    Inpatient Medications    Scheduled Meds:  apixaban  5 mg Oral BID   atorvastatin  80 mg Oral Daily   Chlorhexidine Gluconate Cloth  6 each Topical Daily   fluticasone furoate-vilanterol  1 puff Inhalation Daily   insulin aspart  0-20 Units Subcutaneous Q4H   insulin glargine-yfgn  20 Units Subcutaneous BID   ipratropium-albuterol  3 mL Nebulization TID   methylPREDNISolone (SOLU-MEDROL) injection  40 mg Intravenous Q12H   mupirocin ointment  1 Application Nasal BID   pantoprazole (PROTONIX) IV  40 mg Intravenous Daily   polyethylene glycol  17 g Per Tube Daily   sennosides  10 mL Per Tube BID   sodium chloride flush  3 mL Intravenous Q12H   Continuous Infusions:  amiodarone 30 mg/hr (05/20/23 0600)   phenylephrine (NEO-SYNEPHRINE) Adult infusion 60 mcg/min (05/20/23 0600)   piperacillin-tazobactam (ZOSYN)  IV 3.375 g (05/20/23 0612)   vancomycin Stopped (05/19/23 1331)   PRN Meds: acetaminophen **OR** acetaminophen, mouth rinse, polyethylene glycol, senna-docusate   Vital Signs    Vitals:   05/20/23 0600 05/20/23 0615 05/20/23 0630 05/20/23 0726  BP:      Pulse: 70 94 77   Resp: 13     Temp:      TempSrc:      SpO2: 95% (!) 88% 97% 93%  Weight:      Height:        Intake/Output Summary (Last 24 hours) at 05/20/2023 0836 Last data filed at 05/20/2023 0600 Gross per 24 hour  Intake 1870.81 ml  Output 1250 ml  Net 620.81 ml    Filed Weights   05/18/23 0440 05/19/23 0417 05/20/23 0500  Weight: 126.3 kg 126.2 kg 124.4 kg    Telemetry     Remains in Afib with ventriclar rates in the 60s to 70s bpm, rare PVC vs aberrancy - Personally Reviewed  ECG    No new tracings - Personally Reviewed  Physical Exam   GEN: No acute distress.   Neck: JVD difficult to assess secondary to body habitus. Cardiac: IRIR, no murmurs, rubs, or gallops.  Respiratory: Diminished and coarse breath sounds bilaterally.  GI: Soft, nontender, non-distended.   MS: 1+ bilateral lower extremity edema; No deformity. Neuro:  Alert and oriented x 3; Nonfocal.  Psych: Normal affect.  Labs    Chemistry Recent Labs  Lab 05/17/23 0428 05/18/23 0355 05/19/23 0413 05/20/23 0421  NA 134* 134* 136 136  134*  K 4.1 3.8 4.1 4.2  4.3  CL 96* 98 99 101  98  CO2 19* 22 23 23  22   GLUCOSE 223* 309* 306* 241*  242*  BUN 100* 95* 100* 97*  95*  CREATININE 1.98* 1.93* 1.75* 1.66*  1.73*  CALCIUM 7.8* 7.6* 7.8* 7.8*  8.2*  PROT 6.3* 6.1* 5.8*  --   ALBUMIN 2.9* 2.7* 2.4*  2.5* 2.5*  AST 11* 12* 10*  --   ALT 8 9 9   --   ALKPHOS 49 53 49  --   BILITOT 1.3* 0.8  0.7  --   GFRNONAA 27* 28* 31* 33*  32*  ANIONGAP 19* 14 14 12  14       Hematology Recent Labs  Lab 05/18/23 0355 05/19/23 0413 05/20/23 0421  WBC 12.2* 7.1 10.0  RBC 3.35* 3.10* 3.03*  HGB 10.0* 9.1* 8.8*  HCT 29.5* 27.6* 27.6*  MCV 88.1 89.0 91.1  MCH 29.9 29.4 29.0  MCHC 33.9 33.0 31.9  RDW 19.3* 19.5* 19.5*  PLT 158 154 204     Cardiac EnzymesNo results for input(s): "TROPONINI" in the last 168 hours. No results for input(s): "TROPIPOC" in the last 168 hours.   BNPNo results for input(s): "BNP", "PROBNP" in the last 168 hours.   DDimer No results for input(s): "DDIMER" in the last 168 hours.   Radiology    DG Chest Port 1 View  Result Date: 05/18/2023 IMPRESSION: No active disease. Electronically Signed   By: Elige Ko M.D.   On: 05/18/2023 11:44    Cardiac Studies   LHC 05/04/2023: Conclusions: Severe single-vessel coronary artery disease with  chronic total occlusion of distal RCA with PDA and PL branches filling via left-to-right collaterals.  There is 70-80% stenosis of small-moderate first diagonal branch that is not well-suited to PCI. Mildly elevated left ventricular filling pressure (LVEDP 20 mmHg). Moderate aortic valve stenosis (mean gradient 24 mmHg). Small/stenotic right brachial veins and absent cephalic/basilic veins, not suitable for right heart catheterization.   Recommendations: Restart gentle diuresis. Aggressive secondary prevention of coronary artery disease. Restart apixaban 5 mg twice daily tomorrow morning if no evidence of bleeding/vascular injury. _________  2D echo 05/01/2023: 1. Left ventricular ejection fraction, by estimation, is 60 to 65%. The  left ventricle has normal function. The left ventricle has no regional  wall motion abnormalities. There is mild concentric left ventricular  hypertrophy. Left ventricular diastolic  parameters are consistent with Grade II diastolic dysfunction  (pseudonormalization). Elevated left ventricular end-diastolic pressure.   2. Right ventricular systolic function is normal. The right ventricular  size is normal. There is mildly elevated pulmonary artery systolic  pressure.   3. Left atrial size was moderately dilated.   4. The mitral valve is degenerative. No evidence of mitral valve  regurgitation. Mild mitral stenosis. The mean mitral valve gradient is 7.0  mmHg with average heart rate of 73 bpm. Moderate mitral annular  calcification.   5. The aortic valve is tricuspid. There is mild calcification of the  aortic valve. There is mild thickening of the aortic valve. Aortic valve  regurgitation is not visualized. Mild aortic valve stenosis. Aortic valve  area, by VTI measures 1.08 cm.  Aortic valve mean gradient measures 10.0 mmHg. Aortic valve Vmax measures  2.13 m/s.   6. The inferior vena cava is dilated in size with <50% respiratory  variability, suggesting  right atrial pressure of 15 mmHg.   Comparison(s): EF 60-65.   Patient Profile     67 y.o. female with history of CAD with CTO of the distal RCA, HFpEF, mild to moderate aortic stenosis, normocytic anemia, DVT/PE on Eliquis, DM2, obesity with OSA/OHS and dermatomyositis who we are seeing for evaluation of acute on chronic HFpEF that required mechanical ventilation, now extubated on 05/18/23.   Assessment & Plan    1. Acute on chronic hypoxic and hypercapnic respiratory failure: -Multifactorial including HFpEF, RLL PNA, and OSA/OHS -Now extubated, on CPAP -IV ABX per CCM -CCM following  2. Acute on chronic HFpEF: -Volume status difficult to assess secondary to body habitus -If  CCM team agrees on rounds, would give another one-time dose of IV Lasix 60 mg  -May not be a good candidate for SGLT2i given body habitus -Consider addition of MRA moving forward as renal function allows  3. CAD involving the native coronary arteries with NSTEMI: -High sensitivity troponin peaking at 1678, with subsequent down trend -Eliquis in place of ASA -Echo with preserved LVSF and normal wall motion -LHC this admission with severe single-vessel coronary artery disease with chronic total occlusion of distal RCA with PDA and PL branches filling via left-to-right collaterals. There was 70-80% stenosis of small-moderate first diagonal branch that is not well-suited to PCI. Medical therapy advised  -Not on a beta blocker given hypotension -Statin as below -Cardiac rehab  4. New onset Afib: -Remains in Afib with controlled ventricular response -Developed Afib around 10:27 on 6/19 -PTA on Eliquis for history of DVT/PE -CHADS2VASc at least 5 (CHF, age x 1, DM, vascular disease, sex category) -Eliquis 5 mg bid, dose not meet reduced dosing criteria  -Rate control options are limited secondary to hypotension requiring vasopressor support and AKI -IV amiodarone for rate control -No indication for emergent DCCV  at this time as her BP is stable and was soft prior to development of Afib and with low likelihood of maintaining sinus rhythm at this time in the setting of acute illness -TSH this admission mildly elevated -Potassium and magnesium at goal  5. Aortic stenosis: -Mild aortic stenosis by echo this admission -Follow up as outpatient   5. AKI: -Felt to be secondary to ATN/shock -Renal function stable -Monitor   6. HLD: -LDL 129 this admission with goal being < 55 -Continue titrated dose of Lipitor to 80 mg  -Follow up fasting lipid panel and LFT in the office in 2 months       For questions or updates, please contact [facility] Please consult [email or web address] for contact info under Cardiology/STEMI.    Signed, Eula Listen, PA-C [facility] Pager: [phone] 05/20/2023, 8:36 AM

## 2023-05-20 NOTE — Progress Notes (Addendum)
[facility]  PROGRESS NOTE   Subjective:   Patient sitting up in bed Alert  Remains NPO  HFNC  Creatinine 1.66 Foley catheter- UOP 1.25L  Objective:  Vital signs: Blood pressure (!) 86/33, pulse 77, temperature 98.6 F (37 C), temperature source Oral, resp. rate 13, height 5' 2.01" (1.575 m), weight 124.4 kg, SpO2 93 %.  Intake/Output Summary (Last 24 hours) at 05/20/2023 1037 Last data filed at 05/20/2023 0600 Gross per 24 hour  Intake 1870.81 ml  Output 1250 ml  Net 620.81 ml    Filed Weights   05/18/23 0440 05/19/23 0417 05/20/23 0500  Weight: 126.3 kg 126.2 kg 124.4 kg     Physical Exam: General:  No acute distress  Head:  Normocephalic, atraumatic.   Eyes:  Anicteric  Lungs:  Rhonchi, HFNC  Heart:  S1S2 no rubs  Abdomen:   Soft, nontender, bowel sounds present  Extremities: 1+ peripheral edema.  Neurologic: Alert  Skin:  No lesions  Access: None    Basic Metabolic Panel: Recent Labs  Lab 05/15/23 0550 05/15/23 0551 05/16/23 0357 05/17/23 0428 05/18/23 0355 05/19/23 0413 05/20/23 0421  NA  --    < > 135 134* 134* 136 136  134*  K  --    < > 4.2 4.1 3.8 4.1 4.2  4.3  CL  --    < > 98 96* 98 99 101  98  CO2  --    < > 20* 19* 22 23 23  22   GLUCOSE  --    < > 127* 223* 309* 306* 241*  242*  BUN  --    < > 107* 100* 95* 100* 97*  95*  CREATININE  --    < > 2.05* 1.98* 1.93* 1.75* 1.66*  1.73*  CALCIUM  --    < > 7.2* 7.8* 7.6* 7.8* 7.8*  8.2*  MG 1.7  --  2.1 1.8  --  1.8 2.1  PHOS  --   --   --  6.6*  --  6.8* 6.2*   < > = values in this interval not displayed.    GFR: Estimated Creatinine Clearance: 38.7 mL/min (A) (by C-G formula based on SCr of 1.73 mg/dL (H)).  Liver Function Tests: Recent Labs  Lab 05/14/23 1516 05/16/23 0357 05/17/23 0428 05/18/23 0355 05/19/23 0413 05/20/23 0421  AST 11* 12* 11* 12* 10*  --   ALT 10 10 8 9 9   --   ALKPHOS 45 45 49 53 49  --   BILITOT 0.7 1.2 1.3* 0.8 0.7  --   PROT 6.2*  6.0* 6.3* 6.1* 5.8*  --   ALBUMIN 3.3* 2.9* 2.9* 2.7* 2.4*  2.5* 2.5*    No results for input(s): "LIPASE", "AMYLASE" in the last 168 hours. No results for input(s): "AMMONIA" in the last 168 hours.  CBC: Recent Labs  Lab 05/13/23 2259 05/14/23 0202 05/16/23 0357 05/17/23 0428 05/18/23 0355 05/19/23 0413 05/20/23 0421  WBC 6.0   < > 9.2 14.2* 12.2* 7.1 10.0  NEUTROABS 4.9  --  7.6  --   --   --   --   HGB 9.6*   < > 9.1* 10.2* 10.0* 9.1* 8.8*  HCT 29.7*   < > 27.1* 29.7* 29.5* 27.6* 27.6*  MCV 92.2   < > 88.6 86.3 88.1 89.0 91.1  PLT 207   < > 191 192 158 154 204   < > = values in this interval not displayed.  HbA1C: Hgb A1c MFr Bld  Date/Time Value Ref Range Status  04/30/2023 05:11 AM 9.1 (H) 4.8 - 5.6 % Final    Comment:    (NOTE)         Prediabetes: 5.7 - 6.4         Diabetes: >6.4         Glycemic control for adults with diabetes: <7.0   05/24/2019 09:29 AM 10.4 (H) 4.6 - 6.5 % Final    Comment:    Glycemic Control Guidelines for People with Diabetes:Non Diabetic:  <6%Goal of Therapy: <7%Additional Action Suggested:  >8%     Urinalysis: No results for input(s): "COLORURINE", "LABSPEC", "PHURINE", "GLUCOSEU", "HGBUR", "BILIRUBINUR", "KETONESUR", "PROTEINUR", "UROBILINOGEN", "NITRITE", "LEUKOCYTESUR" in the last 72 hours.  Invalid input(s): "APPERANCEUR"     Imaging: DG Chest Port 1 View  Result Date: 05/19/2023 CLINICAL DATA:  Acute respiratory failure. EXAM: PORTABLE CHEST 1 VIEW COMPARISON:  May 18, 2023. FINDINGS: Stable cardiomegaly. Left internal jugular catheter is unchanged. Endotracheal tube has been removed. Left lung is clear. Significantly decreased right basilar opacity is noted suggesting improving pneumonia or atelectasis. Bony thorax is unremarkable. IMPRESSION: Significantly improved right basilar opacity as described above. Electronically Signed   By: Lupita Raider M.D.   On: 05/19/2023 11:43     Medications:    amiodarone 30  mg/hr (05/20/23 1004)   phenylephrine (NEO-SYNEPHRINE) Adult infusion 60 mcg/min (05/20/23 1011)   piperacillin-tazobactam (ZOSYN)  IV 3.375 g (05/20/23 0612)   vancomycin Stopped (05/19/23 1331)    apixaban  5 mg Oral BID   atorvastatin  80 mg Oral Daily   bisacodyl  10 mg Rectal Once   Chlorhexidine Gluconate Cloth  6 each Topical Daily   fluticasone furoate-vilanterol  1 puff Inhalation Daily   furosemide  60 mg Intravenous Once   insulin aspart  0-20 Units Subcutaneous Q4H   insulin glargine-yfgn  20 Units Subcutaneous BID   ipratropium-albuterol  3 mL Nebulization TID   methylPREDNISolone (SOLU-MEDROL) injection  40 mg Intravenous Q12H   mupirocin ointment  1 Application Nasal BID   pantoprazole (PROTONIX) IV  40 mg Intravenous Daily   polyethylene glycol  17 g Per Tube Daily   sennosides  10 mL Per Tube BID   sodium chloride flush  3 mL Intravenous Q12H    Assessment/ Plan:     67 year old female with a PMHx of morbid obesity, obstructive sleep apnea, bedbound status for 3 years, type 2 diabetes, hypertension, coronary artery disease, congestive heart failure, recent history of cardiac catheterization on 6//6/24.  In the ED she was found to be lethargic with a blood pressure 113/50 and elevated creatinine and potassium.  She was given insulin and glucose and also was placed on sodium bicarbonate drip.  The ED attempted to place a Foley catheter but was unsuccessful.  She has been on losartan, metformin and spironolactone.     Principal Problem:   AKI (acute kidney injury) (HCC) Active Problems:   Essential hypertension   Pulmonary embolism (HCC)   OSA (obstructive sleep apnea)   Altered mental status   Hyperkalemia   Hyponatremia   Anemia   #1: Acute kidney injury: Patient probably has acute kidney injury for the last 1 week after the IV contrast.    Creatinine improved to 1.66. Diuretic use as needed.   #2: Hyperkalemia: Patient received doses of insulin along with  glucose and sodium bicarbonate in ED.   Remains stable  #3: Hyponatremia: This is most likely secondary  to dilutional causes with volume overloaded.   Resolved  #4: Vent failure/respiratory acidosis: This may be secondary to CO2 narcosis. Weaned to HFNC   #5: Coronary artery disease/congestive heart failure/hypertension: Cardiology note appreciated.     Diuretic use as needed   #6: Anemia: Most likely secondary to iron deficiency/acute kidney injury. Hgb 8.8 today   Due to renal stability, we will sign off at this time. Thank you for including [place] in the care team.    LOS: 6 [facility] 6/21/202410:37 AM

## 2023-05-20 NOTE — Consult Note (Signed)
PHARMACY CONSULT NOTE  Pharmacy Consult for Electrolyte Monitoring and Replacement   Recent Labs: Potassium (mmol/L)  Date Value  05/20/2023 4.3  05/20/2023 4.2   Magnesium (mg/dL)  Date Value  16/08/9603 2.1   Calcium (mg/dL)  Date Value  54/07/8118 8.2 (L)  05/20/2023 7.8 (L)   Albumin (g/dL)  Date Value  14/78/2956 2.5 (L)  01/09/2018 4.0   Phosphorus (mg/dL)  Date Value  21/30/8657 6.2 (H)   Sodium (mmol/L)  Date Value  05/20/2023 134 (L)  05/20/2023 136  01/09/2018 138   Assessment: Patient is a 67 y/o F with medical history including morbid obesity, limited functional capacity, nursing home resident admitted with altered mentation, AKI, acute CHF. Pharmacy consulted to assist with electrolyte monitoring and replacement as indicated.   Goal of Therapy:  Potassium >4, Mg >2  Plan:  --No replacement currently indicated --Follow-up electrolytes with AM labs tomorrow  Bettey Costa 05/20/2023 7:30 AM

## 2023-05-21 DIAGNOSIS — I214 Non-ST elevation (NSTEMI) myocardial infarction: Secondary | ICD-10-CM

## 2023-05-21 DIAGNOSIS — I5033 Acute on chronic diastolic (congestive) heart failure: Secondary | ICD-10-CM | POA: Diagnosis not present

## 2023-05-21 DIAGNOSIS — I4891 Unspecified atrial fibrillation: Secondary | ICD-10-CM | POA: Diagnosis not present

## 2023-05-21 DIAGNOSIS — I2583 Coronary atherosclerosis due to lipid rich plaque: Secondary | ICD-10-CM

## 2023-05-21 DIAGNOSIS — I2510 Atherosclerotic heart disease of native coronary artery without angina pectoris: Secondary | ICD-10-CM | POA: Diagnosis not present

## 2023-05-21 DIAGNOSIS — I951 Orthostatic hypotension: Secondary | ICD-10-CM

## 2023-05-21 DIAGNOSIS — N179 Acute kidney failure, unspecified: Secondary | ICD-10-CM | POA: Diagnosis not present

## 2023-05-21 DIAGNOSIS — E7800 Pure hypercholesterolemia, unspecified: Secondary | ICD-10-CM

## 2023-05-21 LAB — CBC
HCT: 28.9 % — ABNORMAL LOW (ref 36.0–46.0)
Hemoglobin: 9.2 g/dL — ABNORMAL LOW (ref 12.0–15.0)
MCH: 29.1 pg (ref 26.0–34.0)
MCHC: 31.8 g/dL (ref 30.0–36.0)
MCV: 91.5 fL (ref 80.0–100.0)
Platelets: 163 10*3/uL (ref 150–400)
RBC: 3.16 MIL/uL — ABNORMAL LOW (ref 3.87–5.11)
RDW: 19.5 % — ABNORMAL HIGH (ref 11.5–15.5)
WBC: 11.4 10*3/uL — ABNORMAL HIGH (ref 4.0–10.5)
nRBC: 0 % (ref 0.0–0.2)

## 2023-05-21 LAB — GLUCOSE, CAPILLARY
Glucose-Capillary: 157 mg/dL — ABNORMAL HIGH (ref 70–99)
Glucose-Capillary: 182 mg/dL — ABNORMAL HIGH (ref 70–99)
Glucose-Capillary: 206 mg/dL — ABNORMAL HIGH (ref 70–99)
Glucose-Capillary: 328 mg/dL — ABNORMAL HIGH (ref 70–99)
Glucose-Capillary: 334 mg/dL — ABNORMAL HIGH (ref 70–99)
Glucose-Capillary: 389 mg/dL — ABNORMAL HIGH (ref 70–99)

## 2023-05-21 LAB — RENAL FUNCTION PANEL
Albumin: 2.6 g/dL — ABNORMAL LOW (ref 3.5–5.0)
Anion gap: 10 (ref 5–15)
BUN: 93 mg/dL — ABNORMAL HIGH (ref 8–23)
CO2: 27 mmol/L (ref 22–32)
Calcium: 8.1 mg/dL — ABNORMAL LOW (ref 8.9–10.3)
Chloride: 101 mmol/L (ref 98–111)
Creatinine, Ser: 1.74 mg/dL — ABNORMAL HIGH (ref 0.44–1.00)
GFR, Estimated: 31 mL/min — ABNORMAL LOW (ref >60)
Glucose, Bld: 185 mg/dL — ABNORMAL HIGH (ref 70–99)
Phosphorus: 5.9 mg/dL — ABNORMAL HIGH (ref 2.5–4.6)
Potassium: 3.9 mmol/L (ref 3.5–5.1)
Sodium: 138 mmol/L (ref 135–145)

## 2023-05-21 LAB — COOXEMETRY PANEL
Carboxyhemoglobin: 1.8 % — ABNORMAL HIGH (ref 0.5–1.5)
Methemoglobin: 0.8 % (ref 0.0–1.5)
O2 Saturation: 89.4 %
Total hemoglobin: 9.6 g/dL — ABNORMAL LOW (ref 12.0–16.0)
Total oxygen content: 87.1 %

## 2023-05-21 LAB — CULTURE, BLOOD (ROUTINE X 2)

## 2023-05-21 MED ORDER — FUROSEMIDE 10 MG/ML IJ SOLN
60.0000 mg | Freq: Once | INTRAMUSCULAR | Status: AC
  Administered 2023-05-21: 60 mg via INTRAVENOUS
  Filled 2023-05-21: qty 6

## 2023-05-21 MED ORDER — FUROSEMIDE 10 MG/ML IJ SOLN
40.0000 mg | Freq: Every day | INTRAMUSCULAR | Status: DC
  Filled 2023-05-21: qty 4

## 2023-05-21 MED ORDER — MIDODRINE HCL 5 MG PO TABS
10.0000 mg | ORAL_TABLET | Freq: Three times a day (TID) | ORAL | Status: DC
  Administered 2023-05-21 (×2): 10 mg via ORAL
  Filled 2023-05-21 (×2): qty 2

## 2023-05-21 MED ORDER — ORAL CARE MOUTH RINSE
15.0000 mL | OROMUCOSAL | Status: DC
  Administered 2023-05-21 – 2023-05-27 (×21): 15 mL via OROMUCOSAL

## 2023-05-21 NOTE — Progress Notes (Addendum)
Progress Note  Patient Name: Sonya Sanchez Date of Encounter: 05/21/2023  Primary Cardiologist: Mariah Milling  Subjective   Transitioned from BiPAP to HFNC. Developed Afib around 10:27 on 6/19, placed on IV amiodarone (on Eliquis, did not receive 6/16 - was in sinus rhythm at that time), remains in Afib with ventricular rates in the 60 to 70s  BP has been in the low 100s systolic but this morning 89/34 mmHg.  Remains on phenylephrine.  O2 sats 100% on HFNC 10L.  Sleepy this am.    Inpatient Medications    Scheduled Meds:  apixaban  5 mg Oral BID   atorvastatin  80 mg Oral Daily   Chlorhexidine Gluconate Cloth  6 each Topical Daily   feeding supplement (NEPRO CARB STEADY)  237 mL Oral TID BM   fluticasone furoate-vilanterol  1 puff Inhalation Daily   insulin aspart  0-20 Units Subcutaneous Q4H   insulin glargine-yfgn  26 Units Subcutaneous BID   ipratropium-albuterol  3 mL Nebulization TID   methylPREDNISolone (SOLU-MEDROL) injection  40 mg Intravenous Q12H   midodrine  10 mg Oral TID WC   multivitamin with minerals  1 tablet Oral Daily   mupirocin ointment  1 Application Nasal BID   mouth rinse  15 mL Mouth Rinse 4 times per day   pantoprazole (PROTONIX) IV  40 mg Intravenous Daily   polyethylene glycol  17 g Per Tube Daily   sennosides  10 mL Per Tube BID   sodium chloride flush  3 mL Intravenous Q12H   Continuous Infusions:  amiodarone 30 mg/hr (05/21/23 1012)   phenylephrine (NEO-SYNEPHRINE) Adult infusion 20 mcg/min (05/21/23 1012)   piperacillin-tazobactam (ZOSYN)  IV Stopped (05/21/23 0936)   vancomycin 150 mL/hr at 05/21/23 1012   PRN Meds: acetaminophen **OR** acetaminophen, mouth rinse, polyethylene glycol, senna-docusate   Vital Signs    Vitals:   05/21/23 0841 05/21/23 0900 05/21/23 0939 05/21/23 1000  BP:  (!) 102/49  (!) 89/34  Pulse:  68 69 69  Resp:  10 18 12   Temp:      TempSrc:      SpO2: 94% 91% 93% 100%  Weight:      Height:        Intake/Output  Summary (Last 24 hours) at 05/21/2023 1020 Last data filed at 05/21/2023 1012 Gross per 24 hour  Intake 1245.52 ml  Output 1750 ml  Net -504.48 ml    Filed Weights   05/19/23 0417 05/20/23 0500 05/21/23 0500  Weight: 126.2 kg 124.4 kg 125.9 kg    Telemetry    Atrial Fibrillation with heart rate in the 60 to 70s- Personally Reviewed  ECG    No new tracings - Personally Reviewed  Physical Exam   GEN: Well nourished, morbid obesity HEENT: Normal NECK: Body habitus limits accurate assessment for JVD LYMPHATICS: No lymphadenopathy CARDIAC:irregularly irregular, no murmurs, rubs, gallops RESPIRATORY:  Clear to auscultation without rales, wheezing or rhonchi  ABDOMEN: Soft, non-tender, non-distended MUSCULOSKELETAL: 2+ BLE edema with chronic venous stasis changes; No deformity  SKIN: Warm and dry NEUROLOGIC:  cannot assess very sleepy PSYCHIATRIC:  cannot assess very sleep Labs    Chemistry Recent Labs  Lab 05/17/23 0428 05/18/23 0355 05/19/23 0413 05/20/23 0421 05/21/23 0320  NA 134* 134* 136 136  134* 138  K 4.1 3.8 4.1 4.2  4.3 3.9  CL 96* 98 99 101  98 101  CO2 19* 22 23 23  22 27   GLUCOSE 223* 309* 306* 241*  242* 185*  BUN 100* 95* 100* 97*  95* 93*  CREATININE 1.98* 1.93* 1.75* 1.66*  1.73* 1.74*  CALCIUM 7.8* 7.6* 7.8* 7.8*  8.2* 8.1*  PROT 6.3* 6.1* 5.8*  --   --   ALBUMIN 2.9* 2.7* 2.4*  2.5* 2.5* 2.6*  AST 11* 12* 10*  --   --   ALT 8 9 9   --   --   ALKPHOS 49 53 49  --   --   BILITOT 1.3* 0.8 0.7  --   --   GFRNONAA 27* 28* 31* 33*  32* 31*  ANIONGAP 19* 14 14 12  14 10       Hematology Recent Labs  Lab 05/19/23 0413 05/20/23 0421 05/21/23 0320  WBC 7.1 10.0 11.4*  RBC 3.10* 3.03* 3.16*  HGB 9.1* 8.8* 9.2*  HCT 27.6* 27.6* 28.9*  MCV 89.0 91.1 91.5  MCH 29.4 29.0 29.1  MCHC 33.0 31.9 31.8  RDW 19.5* 19.5* 19.5*  PLT 154 204 163     Cardiac EnzymesNo results for input(s): "TROPONINI" in the last 168 hours. No results for  input(s): "TROPIPOC" in the last 168 hours.   BNPNo results for input(s): "BNP", "PROBNP" in the last 168 hours.   DDimer No results for input(s): "DDIMER" in the last 168 hours.   Radiology    DG Chest Port 1 View  Result Date: 05/18/2023 IMPRESSION: No active disease. Electronically Signed   By: Elige Ko M.D.   On: 05/18/2023 11:44    Cardiac Studies   LHC 05/04/2023: Conclusions: Severe single-vessel coronary artery disease with chronic total occlusion of distal RCA with PDA and PL branches filling via left-to-right collaterals.  There is 70-80% stenosis of small-moderate first diagonal branch that is not well-suited to PCI. Mildly elevated left ventricular filling pressure (LVEDP 20 mmHg). Moderate aortic valve stenosis (mean gradient 24 mmHg). Small/stenotic right brachial veins and absent cephalic/basilic veins, not suitable for right heart catheterization.   Recommendations: Restart gentle diuresis. Aggressive secondary prevention of coronary artery disease. Restart apixaban 5 mg twice daily tomorrow morning if no evidence of bleeding/vascular injury. _________  2D echo 05/01/2023: 1. Left ventricular ejection fraction, by estimation, is 60 to 65%. The  left ventricle has normal function. The left ventricle has no regional  wall motion abnormalities. There is mild concentric left ventricular  hypertrophy. Left ventricular diastolic  parameters are consistent with Grade II diastolic dysfunction  (pseudonormalization). Elevated left ventricular end-diastolic pressure.   2. Right ventricular systolic function is normal. The right ventricular  size is normal. There is mildly elevated pulmonary artery systolic  pressure.   3. Left atrial size was moderately dilated.   4. The mitral valve is degenerative. No evidence of mitral valve  regurgitation. Mild mitral stenosis. The mean mitral valve gradient is 7.0  mmHg with average heart rate of 73 bpm. Moderate mitral annular   calcification.   5. The aortic valve is tricuspid. There is mild calcification of the  aortic valve. There is mild thickening of the aortic valve. Aortic valve  regurgitation is not visualized. Mild aortic valve stenosis. Aortic valve  area, by VTI measures 1.08 cm.  Aortic valve mean gradient measures 10.0 mmHg. Aortic valve Vmax measures  2.13 m/s.   6. The inferior vena cava is dilated in size with <50% respiratory  variability, suggesting right atrial pressure of 15 mmHg.   Comparison(s): EF 60-65.   Patient Profile     67 y.o. female with history of CAD with CTO of the  distal RCA, HFpEF, mild to moderate aortic stenosis, mild mitral stenosis, normocytic anemia, DVT/PE on Eliquis, DM2, obesity with OSA/OHS and dermatomyositis who we are seeing for evaluation of acute on chronic HFpEF that required mechanical ventilation, now extubated on 05/18/23.   Assessment & Plan    1. Acute on chronic hypoxic and hypercapnic respiratory failure: -Multifactorial including HFpEF, RLL PNA, and OSA/OHS -Now extubated, on CPAP -IV ABX per CCM -CCM following -on Midodrine and phenylephrine for hypotension  2. Acute on chronic HFpEF: -Volume status difficult to assess secondary to body habitus -she put out 1.75L yesterday and is net neg 285cc since admit -SCr 1.75 today -hold off on diuretics today due to hypotension and AKI>>defer dosing of diuretics to renal -Not be a good candidate for SGLT2i given body habitus -Consider addition of MRA on discharge if renal function and BP allow  3. CAD involving the native coronary arteries with NSTEMI: -High sensitivity troponin peaking at 1678, with subsequent down trend -Eliquis in place of ASA due to hx of  DVT/PE -Echo with preserved LVSF and normal wall motion -LHC this admission with severe single-vessel coronary artery disease with chronic total occlusion of distal RCA with PDA and PL branches filling via left-to-right collaterals. There was  70-80% stenosis of small-moderate first diagonal branch that is not well-suited to PCI. Medical therapy advised  -Not on a beta blocker given hypotension -continue Atorvastatin 80mg  daily -Cardiac rehab  4. New onset Afib: -Remains in Afib with controlled ventricular response -Developed Afib around 10:27 on 6/19 -PTA on Eliquis for history of DVT/PE -CHADS2VASc at least 5 (CHF, age x 1, DM, vascular disease, sex category) -continue Eliquis 5mg  BID -Rate control options are limited secondary to hypotension requiring vasopressor support and AKI -continue IV amiodarone for rate control for now -No indication for emergent DCCV at this time as her BP is stable and was soft prior to development of Afib and with low likelihood of maintaining sinus rhythm at this time in the setting of acute illness -TSH this admission mildly elevated -Potassium and magnesium at goal  5. Aortic stenosis: -Mild aortic stenosis by echo this admission -Follow up as outpatient   6.  Mitral stenosis -mild by echo this admi -will followup with yearly echo  7. AKI: -Felt to be secondary to ATN/shock/IV contrast -Renal function stable -Monitor  -defer diuretic dosing to renal  6. HLD: -LDL 129 this admission with goal being < 55 -Atorvastatin 80mg  daily -Follow up fasting lipid panel and LFT in the office in 2 months     I have spent a total of 40 minutes with patient reviewing cardiac cath, 2D echo , telemetry, EKGs, labs and examining patient as well as establishing an assessment and plan that was discussed with the patient.  > 50% of time was spent in direct patient care.    For questions or updates, please contact [facility] Please consult [email or web address] for contact info under Cardiology/STEMI.    Signed, Armanda Magic, MD [facility] 05/21/2023, 10:20 AM

## 2023-05-21 NOTE — Consult Note (Signed)
PHARMACY CONSULT NOTE  Pharmacy Consult for Electrolyte Monitoring and Replacement   Recent Labs: Potassium (mmol/L)  Date Value  05/21/2023 3.9   Magnesium (mg/dL)  Date Value  16/08/9603 2.1   Calcium (mg/dL)  Date Value  54/07/8118 8.1 (L)   Albumin (g/dL)  Date Value  14/78/2956 2.6 (L)  01/09/2018 4.0   Phosphorus (mg/dL)  Date Value  21/30/8657 5.9 (H)   Sodium (mmol/L)  Date Value  05/21/2023 138  01/09/2018 138   Assessment: Patient is a 67 y/o F with medical history including morbid obesity, limited functional capacity, nursing home resident admitted with altered mentation, AKI, acute CHF. Pharmacy consulted to assist with electrolyte monitoring and replacement as indicated.   Goal of Therapy:  Potassium 4.0 - 5.1 mmol/L Magnesium 2.0 - 2.4 mg/dL All Other Electrolytes WNL  Plan:  --No replacement currently indicated --Follow-up electrolytes with AM labs tomorrow  Lowella Bandy 05/21/2023 8:32 AM

## 2023-05-21 NOTE — Progress Notes (Signed)
NAME:  Sonya Sanchez, MRN:  [id number], DOB:  [birth year], LOS: 7 ADMISSION DATE:  05/13/2023, CONSULTATION DATE:  05/14/2023 REFERRING MD:  EDP, CHIEF COMPLAINT:   Altered mental status, acute hypoxic, hypercapnic respiratory failure  History of Present Illness:   67 yo F with obesity, from nursing home. She has as history of PE, DVT, aortic stenosis, HTN, chronic bronchitis, OSA, AKI, dermatomyositis, DM2, dyslipidemia, came in due to worsening renal function noted on bloodwork. She was noted to be lethargic, denied pain anywhere. She was found to have severe hypercapnic acidemia. She was placed on BIPAP. PCCM was consulted to due AMS lethargy on BIPAP.   Pertinent  Medical History    has a past medical history of Calculus of kidney, Diabetes mellitus without complication (HCC), DVT (deep venous thrombosis) (HCC), Family history of early CAD, Gout, unspecified, Heart murmur, Hyperlipidemia, Hypertension, Morbid obesity (HCC), Normal cardiac stress test, Phlebitis and thrombophlebitis of other deep vessels of lower extremities, Phlebitis and thrombophlebitis of other deep vessels of lower extremities, Pulmonary emboli (HCC), Spinal stenosis, unspecified region other than cervical, Tobacco abuse, Type II or unspecified type diabetes mellitus without mention of complication, uncontrolled, Unspecified sleep apnea, and Urinary tract infection, site not specified.   Significant Hospital Events: Including procedures, antibiotic start and stop dates in addition to other pertinent events   05/15/23 central line placed, nephrology, cardiology consult 05/16/2023-Lasix drip increased, metolazone added 6/18-started antibiotics due to increasing WBC count, pancultures sent 6/19 extubated.  Had respiratory distress and was put on BiPAP.  Started amnio for rapid A-fib. 6/20: lasix drip stopped. 6/21: rested off BPAP. Phenyl drip.   Interim History / Subjective:   ABG is improved. Remains on 20%/20L  HFNC  Objective   Blood pressure (!) 113/49, pulse (!) 55, temperature 98 F (36.7 C), temperature source Oral, resp. rate 10, height 5' 2.01" (1.575 m), weight 125.9 kg, SpO2 92 %. CVP:  [4 mmHg-16 mmHg] 6 mmHg  FiO2 (%):  [30 %-40 %] 30 % PEEP:  [8 cmH20] 8 cmH20 Pressure Support:  [10 cmH20] 10 cmH20   Intake/Output Summary (Last 24 hours) at 05/21/2023 0737 Last data filed at 05/21/2023 4098 Gross per 24 hour  Intake 1118.65 ml  Output 1750 ml  Net -631.35 ml   Filed Weights   05/19/23 0417 05/20/23 0500 05/21/23 0500  Weight: 126.2 kg 124.4 kg 125.9 kg    Examination: Gen:      No acute distress, obese HEENT:  EOMI, sclera anicteric Neck:     No masses; no thyromegaly Lungs:    Clear to auscultation bilaterally; normal respiratory effort CV:         Regular rate and rhythm; no murmurs Abd:      + bowel sounds; soft, non-tender; no palpable masses, no distension Ext:    No edema; adequate peripheral perfusion Skin:      Warm and dry; no rash Neuro: Awake, responsive  Labs/imaging reviewed  Resolved Hospital Problem list    Assessment & Plan:  Acute on chronic diastolic heart failure Atrial fibrillation Lasix drip was put on hold by cardiology.  Will resume IV pushes of Lasix to make sure that she remains negative balance.   CVP remains elevated; lasix additional dose this am Appreciate cards eval and recs Continue amiodarone drip and anticoagulation with Eliquis Monitor CVP  AKI secondary to IV contrast, ATN Hyperkalemia-improved Creatinine is improving Monitor urine output  Sepsis.  Possible sources are hospital-acquired pneumonia versus e coli urosepsis Right lower lobe  infiltrate on chest x-ray MRSA positive- continue vancomycin Continue zosyn as E coli in urine is resistant to cefepime Goal is to wean off Neo-Synephrine Procalcitonin is elevated.  Follow cultures  Acute on chronic hypoxic, hypercarbic respiratory failure secondary to CHF History of  OSA, morbid obesity Required BiPAP postextubation.  Will transition from BiPAP to high flow nasal cannula today morning. She will need to be on BiPAP at night and during sleep for history of severe OSA  Dermatomyositis Monitor CK Of azathioprine due to concern for sepsis.  H/O DVT, PE Continue Eliquis  Diabetes mellitus SSI coverage Hyperglycemia remains a  challenge Lantus 26u BID  Best Practice (right click and "Reselect all SmartList Selections" daily)   Diet/type: tubefeeds DVT prophylaxis: DOAC GI prophylaxis: PPI Lines: Central line Foley:  N/A Code Status:  full code Last date of multidisciplinary goals of care discussion [] . Sister updated 6/19  Critical care time:    The patient is critically ill with multiple organ system failure and requires high complexity decision making for assessment and support, frequent evaluation and titration of therapies, advanced monitoring, review of radiographic studies and interpretation of complex data.   Critical Care Time devoted to patient care services, exclusive of separately billable procedures, described in this note is 45 minutes.   Rhea Bleacher MD Pulmonary & Critical Care Medicine

## 2023-05-21 NOTE — Progress Notes (Signed)
Speech Language Pathology Treatment: Dysphagia  Patient Details Name: Sonya Sanchez MRN: [id number] DOB: [birth year] Today's Date: 05/21/2023 Time: 0820-0900 SLP Time Calculation (min) (ACUTE ONLY): 40 min  Assessment / Plan / Recommendation Clinical Impression  Pt seen for ongoing assessment of swallowing this morning. Pt awake, verbal w/ mildly hoarse vocal quality and low volume -- BUT much improved today. Extubated 05/18/23 post ~5 days of oral intubation. Pt A/O x4. Weak but able to hold Cup to feed self. On HFNC O2 support post extubation; 40L. Afebrile, WBC.  Pt has been coughing and expectorating less phlegm today per pt/NSG.  OF NOTE: Pt c/o coughing up phlegm and "choking on it a little" last night while wearing the CPAP -- she removed it ~3x to cough up phlegm - NSG aware.   Pt appears to present w/ potential pharyngeal phase dysphagia c/b intermittent, clinical s/s of aspiration w/ thin liquids -- suspect impact from lengthy oral intubation. Pt's vocal quality remains hoarse d/t suspected trauma from intubation. This presentation could impact the sensation and timing during swallowing.  Pt appears at increased risk for aspiration at this time but was able to adequately tolerate trials of Nectar consistency liquids and minced solids/purees following general aspiration precautions.    Pt has challenging factors that could impact her oropharyngeal swallowing to include fatigue/weakness/deconditioning, declined Pulmonary status requiring HFNC O2 support currently, and dependency for feeding/lying in bed. These factors can increase risk for aspiration, dysphagia as well as decreased oral intake overall.    During po trials, pt consumed trials of Nectar liquids via Cup/straw, minced solids moistened, and purees. W/ all trials, no overt, clinical s/s of aspiration noted: no coughing, decline in vocal quality, nor change in respiratory presentation during/post trials. Suspect the presence of  increased texture/sensory input could have promoted an improved swallow response. Oral phase appeared [facility] w/ timely bolus management, mastication of minced solids, and control of bolus propulsion for A-P transfer for swallowing. Oral clearing achieved w/ trials.    Recommend upgrade of diet to Dysphagia level 2 w/ Nectar liquids Via Cup; gravies/condiments to moisten. General aspiration precautions. Pt to hold Cup herself to drink. NSG Supervision to support feeding and monitor respiratory status, Rest Breaks as needed during po intake. Pills WHOLE vs CRUSHED in Puree for safer, easier swallowing.  Education given on Pills in Puree; general aspiration precautions/swallowing; food and liquid consistencies to pt and NSG. NSG updated, agreed. MD updated. Recommend Dietician f/u for support.  ST services will continue to follow pt for ongoing assessment and trials to upgrade diet to least restrictive consistency next 1-2 days. Pt agreed.     HPI HPI: Pt is a 66 y.o. female who resides at area SNF with a hx of CAD, DM, DVT, acute renal failure, and morbid Obesity w/ worsening CHF and seen by Cardiology at admit the Hospital.  Per MD notes, pt has a long medical history of multiple issues who was admitted 2 weeks ago and underwent left heart cath by Dr. Okey Dupre. 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 She also has mod AS. Her LVEDP was elevated at 20. The patient was discharged home. She has returned to the hospital/ED. She was on bipap initially. Her CO2 was 60 6 hours ago. She is hypotensive, sleepy/somnolent, and has acute kidney injury and hyperkalemia per bloodwork. Post admit, pt required placement of an artificial airway secondary to  Respiratory Failure. She was extubated on 05/18/23 s/p ~5 days of intubation.  CXR today: Endotracheal tube has been removed. Left lung is clear.  Significantly decreased right  basilar opacity is noted suggesting  improving pneumonia or atelectasis.  Prior CXR in 03/2023: Enlarged heart with vascular congestion and trace edema. Small left effusion.      SLP Plan  Continue with current plan of care      Recommendations for follow up therapy are one component of a multi-disciplinary discharge planning process, led by the attending physician.  Recommendations may be updated based on patient status, additional functional criteria and insurance authorization.    Recommendations  Diet recommendations: Dysphagia 2 (fine chop);Nectar-thick liquid Liquids provided via: Cup;Straw (monitor) Medication Administration: Whole meds with puree (vs Crushed as needed) Supervision: Patient able to self feed;Staff to assist with self feeding;Full supervision/cueing for compensatory strategies Compensations: Minimize environmental distractions;Slow rate;Small sips/bites;Lingual sweep for clearance of pocketing;Follow solids with liquid Postural Changes and/or Swallow Maneuvers: Out of bed for meals;Seated upright 90 degrees;Upright 30-60 min after meal                 (Dietician following) Oral care BID;Oral care before and after PO;Staff/trained caregiver to provide oral care (support)   Intermittent Supervision/Assistance Dysphagia, pharyngeal phase (R13.13) (s/p extubation 05/18/23)     Continue with current plan of care       Sonya Som, MS, CCC-SLP Speech Language Pathologist Rehab Services; [facility] [phone] (ascom) Sonya Sanchez  05/21/2023, 10:42 AM

## 2023-05-22 DIAGNOSIS — J9621 Acute and chronic respiratory failure with hypoxia: Secondary | ICD-10-CM | POA: Diagnosis not present

## 2023-05-22 DIAGNOSIS — N179 Acute kidney failure, unspecified: Secondary | ICD-10-CM | POA: Diagnosis not present

## 2023-05-22 DIAGNOSIS — I2510 Atherosclerotic heart disease of native coronary artery without angina pectoris: Secondary | ICD-10-CM | POA: Diagnosis not present

## 2023-05-22 DIAGNOSIS — I350 Nonrheumatic aortic (valve) stenosis: Secondary | ICD-10-CM

## 2023-05-22 DIAGNOSIS — I4891 Unspecified atrial fibrillation: Secondary | ICD-10-CM

## 2023-05-22 DIAGNOSIS — I5033 Acute on chronic diastolic (congestive) heart failure: Secondary | ICD-10-CM | POA: Diagnosis not present

## 2023-05-22 DIAGNOSIS — I050 Rheumatic mitral stenosis: Secondary | ICD-10-CM

## 2023-05-22 LAB — GLUCOSE, CAPILLARY
Glucose-Capillary: 236 mg/dL — ABNORMAL HIGH (ref 70–99)
Glucose-Capillary: 178 mg/dL — ABNORMAL HIGH (ref 70–99)
Glucose-Capillary: 232 mg/dL — ABNORMAL HIGH (ref 70–99)
Glucose-Capillary: 246 mg/dL — ABNORMAL HIGH (ref 70–99)
Glucose-Capillary: 286 mg/dL — ABNORMAL HIGH (ref 70–99)
Glucose-Capillary: 289 mg/dL — ABNORMAL HIGH (ref 70–99)
Glucose-Capillary: 274 mg/dL — ABNORMAL HIGH (ref 70–99)

## 2023-05-22 LAB — CBC
HCT: 28.7 % — ABNORMAL LOW (ref 36.0–46.0)
Hemoglobin: 9.3 g/dL — ABNORMAL LOW (ref 12.0–15.0)
MCH: 29.4 pg (ref 26.0–34.0)
MCHC: 32.4 g/dL (ref 30.0–36.0)
MCV: 90.8 fL (ref 80.0–100.0)
Platelets: 181 10*3/uL (ref 150–400)
RBC: 3.16 MIL/uL — ABNORMAL LOW (ref 3.87–5.11)
RDW: 19.5 % — ABNORMAL HIGH (ref 11.5–15.5)
WBC: 8.9 10*3/uL (ref 4.0–10.5)
nRBC: 0 % (ref 0.0–0.2)

## 2023-05-22 LAB — COOXEMETRY PANEL
Carboxyhemoglobin: 1.5 % (ref 0.5–1.5)
Methemoglobin: 1 % (ref 0.0–1.5)
O2 Saturation: 80.1 %
Total hemoglobin: 9.5 g/dL — ABNORMAL LOW (ref 12.0–16.0)
Total oxygen content: 78.1 %

## 2023-05-22 LAB — RENAL FUNCTION PANEL
Albumin: 2.5 g/dL — ABNORMAL LOW (ref 3.5–5.0)
Anion gap: 13 (ref 5–15)
BUN: 94 mg/dL — ABNORMAL HIGH (ref 8–23)
CO2: 27 mmol/L (ref 22–32)
Calcium: 8.3 mg/dL — ABNORMAL LOW (ref 8.9–10.3)
Chloride: 102 mmol/L (ref 98–111)
Creatinine, Ser: 1.6 mg/dL — ABNORMAL HIGH (ref 0.44–1.00)
GFR, Estimated: 35 mL/min — ABNORMAL LOW (ref >60)
Glucose, Bld: 261 mg/dL — ABNORMAL HIGH (ref 70–99)
Phosphorus: 4.3 mg/dL (ref 2.5–4.6)
Potassium: 3.8 mmol/L (ref 3.5–5.1)
Sodium: 142 mmol/L (ref 135–145)

## 2023-05-22 LAB — CULTURE, BLOOD (ROUTINE X 2)
Culture: NO GROWTH
Special Requests: ADEQUATE

## 2023-05-22 LAB — MAGNESIUM: Magnesium: 1.8 mg/dL (ref 1.7–2.4)

## 2023-05-22 MED ORDER — MIDODRINE HCL 5 MG PO TABS
15.0000 mg | ORAL_TABLET | Freq: Three times a day (TID) | ORAL | Status: DC
  Administered 2023-05-22 (×3): 15 mg via ORAL
  Filled 2023-05-22 (×4): qty 3

## 2023-05-22 MED ORDER — INSULIN GLARGINE-YFGN 100 UNIT/ML ~~LOC~~ SOLN
30.0000 [IU] | Freq: Two times a day (BID) | SUBCUTANEOUS | Status: DC
  Administered 2023-05-22 – 2023-05-24 (×6): 30 [IU] via SUBCUTANEOUS
  Filled 2023-05-22 (×7): qty 0.3

## 2023-05-22 NOTE — Consult Note (Signed)
PHARMACY CONSULT NOTE  Pharmacy Consult for Electrolyte Monitoring and Replacement   Recent Labs: Potassium (mmol/L)  Date Value  05/22/2023 3.8   Magnesium (mg/dL)  Date Value  16/08/9603 1.8   Calcium (mg/dL)  Date Value  54/07/8118 8.3 (L)   Albumin (g/dL)  Date Value  14/78/2956 2.5 (L)  01/09/2018 4.0   Phosphorus (mg/dL)  Date Value  21/30/8657 4.3   Sodium (mmol/L)  Date Value  05/22/2023 142  01/09/2018 138   Assessment: Patient is a 67 y/o F with medical history including morbid obesity, limited functional capacity, nursing home resident admitted with altered mentation, AKI, acute CHF. Pharmacy consulted to assist with electrolyte monitoring and replacement as indicated.   Goal of Therapy:  Potassium 4.0 - 5.1 mmol/L Magnesium 2.0 - 2.4 mg/dL All Other Electrolytes WNL  Plan:  Mg 2 g IV x 1 F/u with AM labs.   Ronnald Ramp, PharmD, BCPS 05/22/2023 8:03 AM

## 2023-05-22 NOTE — Progress Notes (Addendum)
NAME:  Sonya Sanchez, MRN:  [id number], DOB:  08-18-1956, LOS: 8 ADMISSION DATE:  05/13/2023, CONSULTATION DATE:  05/14/2023 REFERRING MD:  EDP, CHIEF COMPLAINT:   Altered mental status, acute hypoxic, hypercapnic respiratory failure  History of Present Illness:   67 yo F with obesity, from nursing home. She has as history of PE, DVT, aortic stenosis, HTN, chronic bronchitis, OSA, AKI, dermatomyositis, DM2, dyslipidemia, came in due to worsening renal function noted on bloodwork. She was noted to be lethargic, denied pain anywhere. She was found to have severe hypercapnic acidemia. She was placed on BIPAP. PCCM was consulted to due AMS lethargy on BIPAP.   Pertinent  Medical History    has a past medical history of Calculus of kidney, Diabetes mellitus without complication (HCC), DVT (deep venous thrombosis) (HCC), Family history of early CAD, Gout, unspecified, Heart murmur, Hyperlipidemia, Hypertension, Morbid obesity (HCC), Normal cardiac stress test, Phlebitis and thrombophlebitis of other deep vessels of lower extremities, Phlebitis and thrombophlebitis of other deep vessels of lower extremities, Pulmonary emboli (HCC), Spinal stenosis, unspecified region other than cervical, Tobacco abuse, Type II or unspecified type diabetes mellitus without mention of complication, uncontrolled, Unspecified sleep apnea, and Urinary tract infection, site not specified.   Significant Hospital Events: Including procedures, antibiotic start and stop dates in addition to other pertinent events   05/15/23 central line placed, nephrology, cardiology consult 05/16/2023-Lasix drip increased, metolazone added 6/18-started antibiotics due to increasing WBC count, pancultures sent 6/19 extubated.  Had respiratory distress and was put on BiPAP.  Started amnio for rapid A-fib. 6/20: lasix drip stopped. 6/21: rested off BPAP. Phenyl drip.  6/22: started on midodrine.  Interim History / Subjective:   Resting comfortably  on NRB.  Objective   Blood pressure (!) 122/53, pulse 64, temperature 98.9 F (37.2 C), temperature source Oral, resp. rate 14, height 5' 2.01" (1.575 m), weight 125.4 kg, SpO2 100 %. CVP:  [5 mmHg-6 mmHg] 5 mmHg      Intake/Output Summary (Last 24 hours) at 05/22/2023 0855 Last data filed at 05/22/2023 0700 Gross per 24 hour  Intake 1406.78 ml  Output 3275 ml  Net -1868.22 ml   Filed Weights   05/20/23 0500 05/21/23 0500 05/22/23 0251  Weight: 124.4 kg 125.9 kg 125.4 kg    Examination: Gen:      No acute distress, obese HEENT:  EOMI, sclera anicteric Neck:     No masses; no thyromegaly Lungs:    Clear to auscultation bilaterally; normal respiratory effort CV:         Regular rate and rhythm; no murmurs Abd:      + bowel sounds; soft, non-tender; no palpable masses, no distension Ext:    No edema; adequate peripheral perfusion Skin:      Warm and dry; no rash Neuro: Awake, responsive  Labs/imaging reviewed  Resolved Hospital Problem list    Assessment & Plan:  Acute on chronic diastolic heart failure Atrial fibrillation CVP remains elevated Appreciate cards eval and recs Continue amiodarone drip and anticoagulation with Eliquis Monitor CVP She appears euvolemic Holding diuresis (6/23)  AKI secondary to IV contrast, ATN Hyperkalemia-improved Creatinine is improving Monitor urine output  Sepsis.  Possible sources are hospital-acquired pneumonia versus e coli urosepsis Right lower lobe infiltrate on chest x-ray MRSA positive- continue vancomycin Continue zosyn as E coli in urine is resistant to cefepime Goal is to wean off Neo-Synephrine Procalcitonin is elevated.  Follow cultures Use neo to keep MAP >65 I suspect that some of this  is diastolic dysfunction; starting midodrine (6/22) to help with BP support  Acute on chronic hypoxic, hypercarbic respiratory failure secondary to CHF History of OSA, morbid obesity Required BiPAP postextubation.  Will transition  from BiPAP to high flow nasal cannula today morning. She will need to be on BiPAP at night and during sleep for history of severe OSA  Dermatomyositis Monitor CK Of azathioprine due to concern for sepsis.  H/O DVT, PE Continue Eliquis  Diabetes mellitus SSI coverage Hyperglycemia remains a  challenge Increased Lantus 30u BID  Best Practice (right click and "Reselect all SmartList Selections" daily)   Diet/type: mech soft DVT prophylaxis: DOAC GI prophylaxis: PPI Lines: Central line Foley:  N/A Code Status:  full code Last date of multidisciplinary goals of care discussion [] . Sister updated 6/19  Critical care time:    The patient is critically ill with multiple organ system failure and requires high complexity decision making for assessment and support, frequent evaluation and titration of therapies, advanced monitoring, review of radiographic studies and interpretation of complex data.   Critical Care Time devoted to patient care services, exclusive of separately billable procedures, described in this note is 50 minutes.   Rhea Bleacher MD Pulmonary & Critical Care Medicine

## 2023-05-22 NOTE — Progress Notes (Addendum)
Progress Note  Patient Name: Sonya Sanchez Date of Encounter: 05/22/2023  Primary Cardiologist: Mariah Milling  Subjective   O2 has been titrated down to 2 L nasal cannula with O2 sats 97% .  Developed Afib around 10:27 on 6/19, placed on IV amiodarone (on Eliquis, did not receive 6/16 - was in sinus rhythm at that time), remains in Afib with good heart rate control  As been on phenylephrine and midodrine for blood pressure support and blood pressure much improved today at 117/41 mmHg Denies any chest pain or shortness of breath  Inpatient Medications    Scheduled Meds:  apixaban  5 mg Oral BID   atorvastatin  80 mg Oral Daily   Chlorhexidine Gluconate Cloth  6 each Topical Daily   feeding supplement (NEPRO CARB STEADY)  237 mL Oral TID BM   fluticasone furoate-vilanterol  1 puff Inhalation Daily   insulin aspart  0-20 Units Subcutaneous Q4H   insulin glargine-yfgn  30 Units Subcutaneous BID   ipratropium-albuterol  3 mL Nebulization TID   methylPREDNISolone (SOLU-MEDROL) injection  40 mg Intravenous Q12H   midodrine  15 mg Oral TID WC   multivitamin with minerals  1 tablet Oral Daily   mupirocin ointment  1 Application Nasal BID   mouth rinse  15 mL Mouth Rinse 4 times per day   pantoprazole (PROTONIX) IV  40 mg Intravenous Daily   polyethylene glycol  17 g Per Tube Daily   sennosides  10 mL Per Tube BID   sodium chloride flush  3 mL Intravenous Q12H   Continuous Infusions:  amiodarone 30 mg/hr (05/22/23 1055)   phenylephrine (NEO-SYNEPHRINE) Adult infusion 10 mcg/min (05/22/23 1055)   piperacillin-tazobactam (ZOSYN)  IV Stopped (05/22/23 1002)   vancomycin Stopped (05/21/23 1159)   PRN Meds: acetaminophen **OR** acetaminophen, mouth rinse, polyethylene glycol, senna-docusate   Vital Signs    Vitals:   05/22/23 0739 05/22/23 0800 05/22/23 0900 05/22/23 1000  BP:  (!) 118/46 (!) 117/41 (!) 139/59  Pulse:  67 71 78  Resp:  12 11 16   Temp: 98.9 F (37.2 C)     TempSrc:  Oral     SpO2:  97% 90% 97%  Weight:      Height:        Intake/Output Summary (Last 24 hours) at 05/22/2023 1059 Last data filed at 05/22/2023 1055 Gross per 24 hour  Intake 1801.33 ml  Output 3275 ml  Net -1473.67 ml    Filed Weights   05/20/23 0500 05/21/23 0500 05/22/23 0251  Weight: 124.4 kg 125.9 kg 125.4 kg    Telemetry    Atrial fibrillation with heart rate in the 60s to 70s- Personally Reviewed  ECG    No new tracings - Personally Reviewed  Physical Exam   GEN: Well nourished, well developed in no acute distress HEENT: Normal NECK: No JVD; No carotid bruits LYMPHATICS: No lymphadenopathy CARDIAC: irregularly irregular, no murmurs, rubs, gallops RESPIRATORY:  Clear to auscultation without rales, wheezing or rhonchi  ABDOMEN: Soft, non-tender, non-distended MUSCULOSKELETAL: 2+ bilateral lower extremity edema chronic venous stasis changes; No deformity  SKIN: Warm and dry NEUROLOGIC:  Alert and oriented x 3 PSYCHIATRIC:  Normal affect  Labs    Chemistry Recent Labs  Lab 05/17/23 0428 05/18/23 0355 05/19/23 0413 05/20/23 0421 05/21/23 0320 05/22/23 0352  NA 134* 134* 136 136  134* 138 142  K 4.1 3.8 4.1 4.2  4.3 3.9 3.8  CL 96* 98 99 101  98 101 102  CO2  19* 22 23 23  22 27 27   GLUCOSE 223* 309* 306* 241*  242* 185* 261*  BUN 100* 95* 100* 97*  95* 93* 94*  CREATININE 1.98* 1.93* 1.75* 1.66*  1.73* 1.74* 1.60*  CALCIUM 7.8* 7.6* 7.8* 7.8*  8.2* 8.1* 8.3*  PROT 6.3* 6.1* 5.8*  --   --   --   ALBUMIN 2.9* 2.7* 2.4*  2.5* 2.5* 2.6* 2.5*  AST 11* 12* 10*  --   --   --   ALT 8 9 9   --   --   --   ALKPHOS 49 53 49  --   --   --   BILITOT 1.3* 0.8 0.7  --   --   --   GFRNONAA 27* 28* 31* 33*  32* 31* 35*  ANIONGAP 19* 14 14 12  14 10 13       Hematology Recent Labs  Lab 05/20/23 0421 05/21/23 0320 05/22/23 0352  WBC 10.0 11.4* 8.9  RBC 3.03* 3.16* 3.16*  HGB 8.8* 9.2* 9.3*  HCT 27.6* 28.9* 28.7*  MCV 91.1 91.5 90.8  MCH 29.0 29.1  29.4  MCHC 31.9 31.8 32.4  RDW 19.5* 19.5* 19.5*  PLT 204 163 181     Cardiac EnzymesNo results for input(s): "TROPONINI" in the last 168 hours. No results for input(s): "TROPIPOC" in the last 168 hours.   BNPNo results for input(s): "BNP", "PROBNP" in the last 168 hours.   DDimer No results for input(s): "DDIMER" in the last 168 hours.   Radiology    DG Chest Port 1 View  Result Date: 05/18/2023 IMPRESSION: No active disease. Electronically Signed   By: Elige Ko M.D.   On: 05/18/2023 11:44    Cardiac Studies   LHC 05/04/2023: Conclusions: Severe single-vessel coronary artery disease with chronic total occlusion of distal RCA with PDA and PL branches filling via left-to-right collaterals.  There is 70-80% stenosis of small-moderate first diagonal branch that is not well-suited to PCI. Mildly elevated left ventricular filling pressure (LVEDP 20 mmHg). Moderate aortic valve stenosis (mean gradient 24 mmHg). Small/stenotic right brachial veins and absent cephalic/basilic veins, not suitable for right heart catheterization.   Recommendations: Restart gentle diuresis. Aggressive secondary prevention of coronary artery disease. Restart apixaban 5 mg twice daily tomorrow morning if no evidence of bleeding/vascular injury. _________  2D echo 05/01/2023: 1. Left ventricular ejection fraction, by estimation, is 60 to 65%. The  left ventricle has normal function. The left ventricle has no regional  wall motion abnormalities. There is mild concentric left ventricular  hypertrophy. Left ventricular diastolic  parameters are consistent with Grade II diastolic dysfunction  (pseudonormalization). Elevated left ventricular end-diastolic pressure.   2. Right ventricular systolic function is normal. The right ventricular  size is normal. There is mildly elevated pulmonary artery systolic  pressure.   3. Left atrial size was moderately dilated.   4. The mitral valve is degenerative. No  evidence of mitral valve  regurgitation. Mild mitral stenosis. The mean mitral valve gradient is 7.0  mmHg with average heart rate of 73 bpm. Moderate mitral annular  calcification.   5. The aortic valve is tricuspid. There is mild calcification of the  aortic valve. There is mild thickening of the aortic valve. Aortic valve  regurgitation is not visualized. Mild aortic valve stenosis. Aortic valve  area, by VTI measures 1.08 cm.  Aortic valve mean gradient measures 10.0 mmHg. Aortic valve Vmax measures  2.13 m/s.   6. The inferior vena cava  is dilated in size with <50% respiratory  variability, suggesting right atrial pressure of 15 mmHg.   Comparison(s): EF 60-65.   Patient Profile     67 y.o. female with history of CAD with CTO of the distal RCA, HFpEF, mild to moderate aortic stenosis, mild mitral stenosis, normocytic anemia, DVT/PE on Eliquis, DM2, obesity with OSA/OHS and dermatomyositis who we are seeing for evaluation of acute on chronic HFpEF that required mechanical ventilation, now extubated on 05/18/23.   Assessment & Plan    1. Acute on chronic hypoxic and hypercapnic respiratory failure: -Multifactorial including HFpEF, RLL PNA, and OSA/OHS -Now extubated on O2 at 2 L with O2 saturations 97% -IV ABX per CCM -CCM following -on Midodrine and phenylephrine for hypotension >>BP is significantly improved>> wean off phenylephrine  2. Acute on chronic HFpEF: -Volume status difficult to assess secondary to body habitus -she put out 3.27 L yesterday and is net neg 1.76 L since admit -SCr improved from 1.75>>1.60 today -Diuretics have been on hold due to hypotension and AKI and have been deferred to dosing per renal  -Not be a good candidate for SGLT2i given body habitus -Consider addition of MRA on discharge if renal function and BP allow  3. CAD involving the native coronary arteries with NSTEMI: -High sensitivity troponin peaking at 1678, with subsequent down  trend -Eliquis in place of ASA due to hx of  DVT/PE -Echo with preserved LVSF and normal wall motion -LHC this admission with severe single-vessel coronary artery disease with chronic total occlusion of distal RCA with PDA and PL branches filling via left-to-right collaterals. There was 70-80% stenosis of small-moderate first diagonal branch that is not well-suited to PCI. Medical therapy advised  -Not on a beta blocker given hypotension -No aspirin due to DOAC -Continue atorvastatin 80 mg daily -Cardiac rehab  4. New onset Afib: -Developed Afib around 10:27 on 6/19 -PTA on Eliquis for history of DVT/PE -CHADS2VASc at least 5 (CHF, age x 1, DM, vascular disease, sex category) -Remains in atrial fibrillation with controlled heart rate -Rate control options have been limited secondary to hypotension requiring vasopressor support as well as AKI and started on IV amiodarone with good heart rate control -No indication for emergent DCCV at this time as her BP is stable and was soft prior to development of Afib and with low likelihood of maintaining sinus rhythm at this time in the setting of acute illness -Continue Eliquis 5 mg twice daily as well as IV amiodarone drip -TSH this admission mildly elevated -Potassium 3.8 today magnesium 1.8  5. Aortic stenosis: -Mild aortic stenosis by echo this admission -Follow up as outpatient   6.  Mitral stenosis -mild by echo this admi -will followup with yearly echo  7. AKI: -Felt to be secondary to ATN/shock/IV contrast -Renal function stable -Monitor  -defer diuretic dosing to renal  6. HLD: -LDL 129 this admission with goal being < 55 -Atorvastatin 80mg  daily -Follow up fasting lipid panel and LFT in the office in 2 months  Performed by: Armanda Magic, MD   Total critical care time: 40 minutes   Critical care time was exclusive of separately billable procedures and treating other patients.   Critical care was necessary to treat or  prevent imminent or life-threatening deterioration.   Critical care was time spent personally by me (independent of APPs or residents) on the following activities: development of treatment plan with patient and/or surrogate as well as nursing, discussions with consultants, evaluation of patient's response to  treatment, examination of patient, obtaining history from patient or surrogate, ordering and performing treatments and interventions, ordering and review of laboratory studies, ordering and review of radiographic studies, pulse oximetry and re-evaluation of patient's condition. For questions or updates, please contact [facility] Please consult [email or web address] for contact info under Cardiology/STEMI.    Signed, Armanda Magic, MD [facility] 05/22/2023, 10:59 AM

## 2023-05-22 NOTE — Progress Notes (Signed)
Speech Language Pathology Treatment: Dysphagia  Patient Details Name: Sonya Sanchez MRN: [id number] DOB: [birth year] Today's Date: 05/22/2023 Time: 4098-1191 SLP Time Calculation (min) (ACUTE ONLY): 20 min  Assessment / Plan / Recommendation Clinical Impression  Pt seen for diet tolerance and trials of upgraded liquids. Pt alert, pleasant, and cooperative. On 2L/min O2 via [place]. Mildly dysphonic vocal quality appreciated c/b vocal hoarseness and hypophonia. Pt with subjective report of improved vocal quality. Pt consuming breakfast upon SLP entrance to room. Pt feeding self independently, pt reported having set up assistance from nursing staff. Pt observed with items from meal tray including scrambled/chopped eggs, chopped sausage, pureed fruit, and nectar-thick milk. Oral phase was [facility] with textures. No overt s/sx pharyngeal dysphagia. Pt endorsed consuming water over night due to "dry throat" without difficulty. Pt observed consuming ~3 oz of H2O via cup and straw sips without overt s/sx pharyngeal dysphagia. Recommend diet upgrade to Dysphagia 2 with THIN liquids with safe safe swallowing strategies/aspiration precautions as outlined below. SLP to f/u per POC for diet tolerance and trials of upgraded textures, as appropriate. Pt and RN made aware of results, recommendations, and SLP POC. Pt verbalized understanding/agreement.   HPI HPI: Pt is a 67 y.o. female who resides at area SNF with a hx of CAD, DM, DVT, acute renal failure, and morbid Obesity w/ worsening CHF and seen by Cardiology at admit the Hospital.  Per MD notes, pt has a long medical history of multiple issues who was admitted 2 weeks ago and underwent left heart cath by Dr. Okey Dupre. 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 She also has mod AS. Her LVEDP was elevated at 20. The patient was discharged home. She has returned to the hospital/ED. She was  on bipap initially. Her CO2 was 60 6 hours ago. She is hypotensive, sleepy/somnolent, and has acute kidney injury and hyperkalemia per bloodwork. Post admit, pt required placement of an artificial airway secondary to Respiratory Failure. She was extubated on 05/18/23 s/p ~5 days of intubation.  CXR today: Endotracheal tube has been removed. Left lung is clear.  Significantly decreased right basilar opacity is noted suggesting  improving pneumonia or atelectasis.  Prior CXR in 03/2023: Enlarged heart with vascular congestion and trace edema. Small left effusion.      SLP Plan  Continue with current plan of care      Recommendations for follow up therapy are one component of a multi-disciplinary discharge planning process, led by the attending physician.  Recommendations may be updated based on patient status, additional functional criteria and insurance authorization.    Recommendations  Diet recommendations: Dysphagia 2 (fine chop);Thin liquid Liquids provided via: Teaspoon;Cup;Straw Medication Administration: Whole meds with liquid (vs crushed, as needed) Supervision: Patient able to self feed;Staff to assist with self feeding;Full supervision/cueing for compensatory strategies Compensations: Minimize environmental distractions;Slow rate;Small sips/bites;Follow solids with liquid Postural Changes and/or Swallow Maneuvers: Out of bed for meals;Seated upright 90 degrees;Upright 30-60 min after meal                 (RD following) Oral care BID;Oral care before and after PO;Staff/trained caregiver to provide oral care   Intermittent Supervision/Assistance Dysphagia, pharyngeal phase (R13.13)     Continue with current plan of care    Sonya Sanchez, M.S., CCC-SLP Speech-Language Pathologist [facility] [phone] (ASCOM)   Woodroe Chen  05/22/2023,  10:07 AM

## 2023-05-23 DIAGNOSIS — N179 Acute kidney failure, unspecified: Secondary | ICD-10-CM | POA: Diagnosis not present

## 2023-05-23 DIAGNOSIS — Z7189 Other specified counseling: Secondary | ICD-10-CM | POA: Diagnosis not present

## 2023-05-23 DIAGNOSIS — I5033 Acute on chronic diastolic (congestive) heart failure: Secondary | ICD-10-CM | POA: Diagnosis not present

## 2023-05-23 LAB — GLUCOSE, CAPILLARY
Glucose-Capillary: 232 mg/dL — ABNORMAL HIGH (ref 70–99)
Glucose-Capillary: 199 mg/dL — ABNORMAL HIGH (ref 70–99)
Glucose-Capillary: 180 mg/dL — ABNORMAL HIGH (ref 70–99)
Glucose-Capillary: 206 mg/dL — ABNORMAL HIGH (ref 70–99)
Glucose-Capillary: 284 mg/dL — ABNORMAL HIGH (ref 70–99)
Glucose-Capillary: 309 mg/dL — ABNORMAL HIGH (ref 70–99)

## 2023-05-23 LAB — CBC
HCT: 27.1 % — ABNORMAL LOW (ref 36.0–46.0)
Hemoglobin: 8.6 g/dL — ABNORMAL LOW (ref 12.0–15.0)
MCH: 28.9 pg (ref 26.0–34.0)
MCHC: 31.7 g/dL (ref 30.0–36.0)
MCV: 90.9 fL (ref 80.0–100.0)
Platelets: 157 10*3/uL (ref 150–400)
RBC: 2.98 MIL/uL — ABNORMAL LOW (ref 3.87–5.11)
RDW: 19.6 % — ABNORMAL HIGH (ref 11.5–15.5)
WBC: 6.8 10*3/uL (ref 4.0–10.5)
nRBC: 0 % (ref 0.0–0.2)

## 2023-05-23 LAB — RENAL FUNCTION PANEL
Albumin: 2.4 g/dL — ABNORMAL LOW (ref 3.5–5.0)
Anion gap: 11 (ref 5–15)
BUN: 85 mg/dL — ABNORMAL HIGH (ref 8–23)
CO2: 27 mmol/L (ref 22–32)
Calcium: 8.4 mg/dL — ABNORMAL LOW (ref 8.9–10.3)
Chloride: 104 mmol/L (ref 98–111)
Creatinine, Ser: 1.53 mg/dL — ABNORMAL HIGH (ref 0.44–1.00)
GFR, Estimated: 37 mL/min — ABNORMAL LOW (ref >60)
Glucose, Bld: 213 mg/dL — ABNORMAL HIGH (ref 70–99)
Phosphorus: 3.5 mg/dL (ref 2.5–4.6)
Potassium: 4 mmol/L (ref 3.5–5.1)
Sodium: 142 mmol/L (ref 135–145)

## 2023-05-23 LAB — COOXEMETRY PANEL
Carboxyhemoglobin: 1 % (ref 0.5–1.5)
Methemoglobin: 1.3 % (ref 0.0–1.5)
O2 Saturation: 96.9 %
Total hemoglobin: 10.1 g/dL — ABNORMAL LOW (ref 12.0–16.0)
Total oxygen content: 94.6 %

## 2023-05-23 LAB — IRON AND TIBC
Iron: 57 ug/dL (ref 28–170)
Saturation Ratios: 37 % — ABNORMAL HIGH (ref 10.4–31.8)
TIBC: 154 ug/dL — ABNORMAL LOW (ref 250–450)
UIBC: 97 ug/dL

## 2023-05-23 LAB — VITAMIN D 25 HYDROXY (VIT D DEFICIENCY, FRACTURES): Vit D, 25-Hydroxy: 50.36 ng/mL (ref 30–100)

## 2023-05-23 LAB — FERRITIN: Ferritin: 924 ng/mL — ABNORMAL HIGH (ref 11–307)

## 2023-05-23 LAB — MAGNESIUM: Magnesium: 1.9 mg/dL (ref 1.7–2.4)

## 2023-05-23 MED ORDER — DOXYCYCLINE HYCLATE 100 MG PO TABS
100.0000 mg | ORAL_TABLET | Freq: Two times a day (BID) | ORAL | Status: DC
  Administered 2023-05-23 – 2023-05-27 (×8): 100 mg via ORAL
  Filled 2023-05-23 (×8): qty 1

## 2023-05-23 MED ORDER — METHYLPREDNISOLONE SODIUM SUCC 40 MG IJ SOLR
40.0000 mg | Freq: Two times a day (BID) | INTRAMUSCULAR | Status: AC
  Administered 2023-05-23: 40 mg via INTRAVENOUS
  Filled 2023-05-23: qty 1

## 2023-05-23 MED ORDER — POLYETHYLENE GLYCOL 3350 17 G PO PACK
17.0000 g | PACK | Freq: Every day | ORAL | Status: DC
  Administered 2023-05-25 – 2023-05-26 (×2): 17 g via ORAL
  Filled 2023-05-23 (×3): qty 1

## 2023-05-23 MED ORDER — PREDNISONE 10 MG PO TABS: 10.0000 mg | ORAL_TABLET | Freq: Every day | ORAL | Status: DC

## 2023-05-23 MED ORDER — PREDNISONE 20 MG PO TABS
40.0000 mg | ORAL_TABLET | Freq: Every day | ORAL | Status: AC
  Administered 2023-05-24 – 2023-05-26 (×3): 40 mg via ORAL
  Filled 2023-05-23 (×2): qty 4
  Filled 2023-05-23: qty 2

## 2023-05-23 MED ORDER — PREDNISONE 20 MG PO TABS: 20.0000 mg | ORAL_TABLET | Freq: Every day | ORAL | Status: DC

## 2023-05-23 MED ORDER — MIDODRINE HCL 5 MG PO TABS
10.0000 mg | ORAL_TABLET | Freq: Three times a day (TID) | ORAL | Status: DC
  Administered 2023-05-23 – 2023-05-24 (×4): 10 mg via ORAL
  Filled 2023-05-23 (×4): qty 2

## 2023-05-23 MED ORDER — SENNA 8.6 MG PO TABS
1.0000 | ORAL_TABLET | Freq: Two times a day (BID) | ORAL | Status: DC
  Administered 2023-05-25 – 2023-05-26 (×4): 8.6 mg via ORAL
  Filled 2023-05-23 (×7): qty 1

## 2023-05-23 MED ORDER — PREDNISONE 20 MG PO TABS
30.0000 mg | ORAL_TABLET | Freq: Every day | ORAL | Status: DC
  Administered 2023-05-27: 30 mg via ORAL
  Filled 2023-05-23: qty 1

## 2023-05-23 MED ORDER — PANTOPRAZOLE SODIUM 40 MG PO TBEC
40.0000 mg | DELAYED_RELEASE_TABLET | Freq: Every day | ORAL | Status: DC
  Administered 2023-05-24 – 2023-05-27 (×4): 40 mg via ORAL
  Filled 2023-05-23 (×4): qty 1

## 2023-05-23 MED ORDER — IPRATROPIUM-ALBUTEROL 0.5-2.5 (3) MG/3ML IN SOLN
3.0000 mL | Freq: Two times a day (BID) | RESPIRATORY_TRACT | Status: DC
  Administered 2023-05-23 – 2023-05-26 (×5): 3 mL via RESPIRATORY_TRACT
  Filled 2023-05-23 (×6): qty 3

## 2023-05-23 MED ORDER — ENSURE ENLIVE PO LIQD
237.0000 mL | Freq: Three times a day (TID) | ORAL | Status: DC
  Administered 2023-05-23 – 2023-05-27 (×12): 237 mL via ORAL

## 2023-05-23 MED ORDER — TORSEMIDE 20 MG PO TABS
20.0000 mg | ORAL_TABLET | Freq: Every day | ORAL | Status: DC
  Administered 2023-05-23 – 2023-05-27 (×5): 20 mg via ORAL
  Filled 2023-05-23 (×5): qty 1

## 2023-05-23 NOTE — Inpatient Diabetes Management (Signed)
Inpatient Diabetes Program Recommendations  AACE/ADA: New Consensus Statement on Inpatient Glycemic Control  Target Ranges:  Prepandial:   less than 140 mg/dL      Peak postprandial:   less than 180 mg/dL (1-2 hours)      Critically ill patients:  140 - 180 mg/dL    Latest Reference Range & Units 05/23/23 03:55 05/23/23 07:54 05/23/23 11:16  Glucose-Capillary 70 - 99 mg/dL 829 (H) 562 (H) 130 (H)    Latest Reference Range & Units 05/22/23 07:32 05/22/23 11:50 05/22/23 17:11 05/22/23 19:56 05/22/23 20:14 05/22/23 23:26  Glucose-Capillary 70 - 99 mg/dL 865 (H) 784 (H) 696 (H) 289 (H) 286 (H) 274 (H)   Review of Glycemic Control  Diabetes history: DM2 Outpatient Diabetes medications: Basaglar 40 units at bedtime, Humalog 6 units TID, Jardiance 10 mg daily, Ozempic 0.25 mg Qweek Current orders for Inpatient glycemic control: Semglee 30 units BID, Novolog 0-20 units Q4H; Solumedrol 40 mg Q12H   Inpatient Diabetes Program Recommendations:     Insulin: If Solumedrol continued as ordered, please consider increasing Semglee to 33 units BID.  If patient is eating well, consider ordering Novolog 3 units TID with meals for meal coverage if patient eats at least 50% of meals.   Thanks, Orlando Penner, RN, MSN, CDCES Diabetes Coordinator Inpatient Diabetes Program [phone] (Team Pager from 8am to 5pm)

## 2023-05-23 NOTE — Plan of Care (Signed)
  Problem: Education: Goal: Understanding of CV disease, CV risk reduction, and recovery process will improve Outcome: Progressing   Problem: Activity: Goal: Ability to return to baseline activity level will improve Outcome: Progressing   Problem: Health Behavior/Discharge Planning: Goal: Ability to safely manage health-related needs after discharge will improve Outcome: Progressing

## 2023-05-23 NOTE — Consult Note (Signed)
Consultation Note Date: 05/23/2023   Patient Name: Sonya Sanchez  DOB: [birth year]  MRN: [id number]  Age / Sex: 67 y.o., female  PCP: Pcp, No Referring Physician: Gillis Santa, MD  Reason for Consultation: Establishing goals of care  HPI/Patient Profile: 67 yo F with obesity, from nursing home. She has as history of PE, DVT, aortic stenosis, HTN, chronic bronchitis, OSA, AKI, dermatomyositis, DM2, dyslipidemia, came in due to worsening renal function noted on bloodwork. She was noted to be lethargic, denied pain anywhere. She was found to have severe hypercapnic acidemia. She was placed on BIPAP. PCCM was consulted to due AMS lethargy on BIPAP.   Clinical Assessment and Goals of Care: Notes and labs reviewed.  In to see patient.  She is currently sitting in bed.  She states that she moved to [place] from [place] and has a sister who lives in the area, in addition to a niece and nephew.  She states she is single and does not have children.  She states she has lived at [facility] since January 2024.  She states she has been bedbound and unable to walk for the past 3 years.  She confirms that her sister would be her surrogate decision maker.She discusses waiting to see the outcome of her TEE on Wednesday, and how she does moving forward.  Discussed that I would continue to follow.    SUMMARY OF RECOMMENDATIONS   PMT will follow      Primary Diagnoses: Present on Admission:  Altered mental status  OSA (obstructive sleep apnea)  Pulmonary embolism (HCC)  Essential hypertension  Non-ST elevation (NSTEMI) myocardial infarction (HCC)  Acute on chronic respiratory failure with hypoxemia (HCC)  Acute on chronic heart failure with preserved ejection fraction (HCC)  Coronary artery disease due to lipid rich plaque   I have reviewed the medical record, interviewed the patient and family, and  examined the patient. The following aspects are pertinent.  Past Medical History:  Diagnosis Date   Calculus of kidney    Diabetes mellitus without complication (HCC)    DVT (deep venous thrombosis) (HCC)    a. on Eliquis   Family history of early CAD    a. parents passing in their 81's from CAD   Gout, unspecified    Heart murmur    Hyperlipidemia    Hypertension    Morbid obesity (HCC)    Normal cardiac stress test    a. equivocal study, sig soft tissue artifact present, no chest discomfort or ECG changes, perfusion images suggest mod sized region of mild reversible perfusion defect. Findings may be 2/2 shifting soft tissue attenuation, but cannot r/o ischemia, EF 72%   Phlebitis and thrombophlebitis of other deep vessels of lower extremities    Phlebitis and thrombophlebitis of other deep vessels of lower extremities    Pulmonary emboli (HCC)    a. on Eliquis   Spinal stenosis, unspecified region other than cervical    Tobacco abuse    Type II or unspecified type diabetes mellitus without  mention of complication, uncontrolled    Unspecified sleep apnea    Urinary tract infection, site not specified    Social History   Socioeconomic History   Marital status: Single    Spouse name: Not on file   Number of children: Not on file   Years of education: Not on file   Highest education level: Not on file  Occupational History   Not on file  Tobacco Use   Smoking status: Former    Packs/day: 1.00    Years: 10.00    Additional pack years: 0.00    Total pack years: 10.00    Types: Cigarettes    Quit date: 12/26/1999    Years since quitting: 23.4   Smokeless tobacco: Never  Substance and Sexual Activity   Alcohol use: Yes    Comment: socially   Drug use: No   Sexual activity: Not Currently  Other Topics Concern   Not on file  Social History Narrative   Lives in [place], [place] summer (near the [place]), [place] Dec-[place].  Sister and children live here in [place]      Work -  Retired Designer, television/film set      Diet - regular      Exercise - walks, limited by knee and ankle pain   Social Determinants of Corporate investment banker Strain: Not on file  Food Insecurity: No Food Insecurity (05/18/2023)   Hunger Vital Sign    Worried About Running Out of Food in the Last Year: Never true    Ran Out of Food in the Last Year: Never true  Transportation Needs: No Transportation Needs (05/18/2023)   PRAPARE - Administrator, Civil Service (Medical): No    Lack of Transportation (Non-Medical): No  Physical Activity: Not on file  Stress: Not on file  Social Connections: Not on file   Family History  Problem Relation Age of Onset   Heart attack Mother 3   Cancer Mother        breast   Heart attack Father 84   Arthritis Sister    Scheduled Meds:  apixaban  5 mg Oral BID   atorvastatin  80 mg Oral Daily   Chlorhexidine Gluconate Cloth  6 each Topical Daily   feeding supplement  237 mL Oral TID BM   fluticasone furoate-vilanterol  1 puff Inhalation Daily   insulin aspart  0-20 Units Subcutaneous Q4H   insulin glargine-yfgn  30 Units Subcutaneous BID   ipratropium-albuterol  3 mL Nebulization TID   methylPREDNISolone (SOLU-MEDROL) injection  40 mg Intravenous Q12H   midodrine  10 mg Oral TID WC   multivitamin with minerals  1 tablet Oral Daily   mupirocin ointment  1 Application Nasal BID   mouth rinse  15 mL Mouth Rinse 4 times per day   [START ON 05/24/2023] pantoprazole  40 mg Oral Daily   [START ON 05/24/2023] polyethylene glycol  17 g Oral Daily   senna  1 tablet Oral BID   sodium chloride flush  3 mL Intravenous Q12H   torsemide  20 mg Oral Daily   Continuous Infusions:  amiodarone 30 mg/hr (05/23/23 1500)   piperacillin-tazobactam (ZOSYN)  IV 3.375 g (05/23/23 1505)   vancomycin Stopped (05/23/23 1149)   PRN Meds:.acetaminophen **OR** acetaminophen, mouth rinse, polyethylene glycol, senna-docusate Medications Prior to Admission:  Prior to  Admission medications   Medication Sig Start Date End Date Taking? Authorizing Provider  acetaminophen (TYLENOL) 325 MG tablet Take 650 mg by  mouth every 4 (four) hours as needed for mild pain, fever or headache.   Yes [provider]  atorvastatin (LIPITOR) 40 MG tablet Take 40 mg by mouth at bedtime.   Yes [provider]  azathioprine (IMURAN) 100 MG tablet Take 100 mg by mouth 2 (two) times daily.   Yes [provider]  Calcium Carb-Cholecalciferol (CALCIUM 600+D) 600-10 MG-MCG TABS Take 1 tablet by mouth daily.   Yes [provider]  Cholecalciferol (VITAMIN D) 1000 UNITS capsule Take 2,000 Units by mouth daily.   Yes [provider]  ELIQUIS 5 MG TABS tablet TAKE 1 TABLET(5 MG) BY MOUTH TWICE DAILY 03/29/18  Yes Gollan, Tollie Pizza, MD  empagliflozin (JARDIANCE) 10 MG TABS tablet Take 10 mg by mouth daily.   Yes [provider]  fluticasone (FLONASE) 50 MCG/ACT nasal spray Place 2 sprays into both nostrils daily as needed for allergies or rhinitis. 02/21/19  Yes McLean-Scocuzza, Pasty Spillers, MD  Fluticasone-Umeclidin-Vilant 100-62.5-25 MCG/ACT AEPB Inhale 1 puff into the lungs daily.   Yes [provider]  furosemide (LASIX) 40 MG tablet Take 1 tablet (40 mg total) by mouth daily. 01/09/18  Yes Gollan, Tollie Pizza, MD  Insulin Glargine (BASAGLAR KWIKPEN) 100 UNIT/ML Inject 40 Units into the skin at bedtime.   Yes [provider]  insulin lispro (HUMALOG) 100 UNIT/ML injection Inject 6 Units into the skin 3 (three) times daily before meals.   Yes [provider]  loratadine (CLARITIN) 10 MG tablet Take 1 tablet (10 mg total) by mouth daily as needed for allergies. 02/21/19  Yes McLean-Scocuzza, Pasty Spillers, MD  losartan (COZAAR) 100 MG tablet Take 100 mg by mouth daily.   Yes [provider]  metoprolol succinate (TOPROL-XL) 25 MG 24 hr tablet Take 1 tablet (25 mg total) by mouth daily. 05/06/23 06/05/23 Yes Agbata, Tochukwu,  MD  Multiple Vitamin (MULTIVITAMIN) tablet Take 1 tablet by mouth daily.   Yes [provider]  omeprazole (PRILOSEC) 20 MG capsule Take 20 mg by mouth daily.   Yes [provider]  pregabalin (LYRICA) 200 MG capsule Take 200 mg by mouth 3 (three) times daily.   Yes [provider]  Semaglutide, 2 MG/DOSE, (OZEMPIC, 2 MG/DOSE,) 8 MG/3ML SOPN Inject 0.25 mg once weekly for 4 week and increase to 0.5 mg weekly and follow up with PCP. 05/05/23  Yes Agbata, Tochukwu, MD  spironolactone (ALDACTONE) 25 MG tablet Take 25 mg by mouth daily.   Yes [provider]  insulin aspart (NOVOLOG) 100 UNIT/ML injection Inject 4 Units into the skin 3 (three) times daily with meals. Patient not taking: Reported on 05/14/2023 05/05/23   Lucile Shutters, MD  irbesartan (AVAPRO) 300 MG tablet Take 1 tablet (300 mg total) by mouth daily. Patient not taking: Reported on 05/14/2023 05/06/23 06/05/23  Lucile Shutters, MD   Allergies  Allergen Reactions   Sulfa Antibiotics Other (See Comments)    Reaction: isn't certain, thinks she ran a fever, or had a rash, maybe both.   Review of Systems  All other systems reviewed and are negative.   Physical Exam Pulmonary:     Effort: Pulmonary effort is normal.  Neurological:     Mental Status: She is alert.     Vital Signs: BP (!) 126/53   Pulse 86   Temp 98.8 F (37.1 C) (Oral)   Resp (!) 21   Ht 5' 2.01" (1.575 m)   Wt 121.3 kg   SpO2 92%   BMI  48.90 kg/m  Pain Scale: 0-10   Pain Score: 0-No pain   SpO2: SpO2: 92 % O2 Device:SpO2: 92 % O2 Flow Rate: .O2 Flow Rate (L/min): 2 L/min  IO: Intake/output summary:  Intake/Output Summary (Last 24 hours) at 05/23/2023 1521 Last data filed at 05/23/2023 1500 Gross per 24 hour  Intake 994.34 ml  Output 2250 ml  Net -1255.66 ml    LBM: Last BM Date : 05/22/23 Baseline Weight: Weight: 132.7 kg Most recent weight: Weight: 121.3 kg        Signed by: Morton Stall, NP    Please contact Palliative Medicine Team phone at [phone] for questions and concerns.  For individual provider: See Loretha Stapler

## 2023-05-23 NOTE — Consult Note (Signed)
PHARMACY CONSULT NOTE  Pharmacy Consult for Electrolyte Monitoring and Replacement   Recent Labs: Potassium (mmol/L)  Date Value  05/23/2023 4.0   Magnesium (mg/dL)  Date Value  16/08/9603 1.9   Calcium (mg/dL)  Date Value  54/07/8118 8.4 (L)   Albumin (g/dL)  Date Value  14/78/2956 2.4 (L)  01/09/2018 4.0   Phosphorus (mg/dL)  Date Value  21/30/8657 3.5   Sodium (mmol/L)  Date Value  05/23/2023 142  01/09/2018 138   Assessment: Patient is a 67 y/o F with medical history including morbid obesity, limited functional capacity, nursing home resident admitted with altered mentation, AKI, acute CHF. Pharmacy consulted to assist with electrolyte monitoring and replacement as indicated.   Goal of Therapy:  Potassium 4.0 - 5.1 mmol/L Magnesium 2.0 - 2.4 mg/dL All Other Electrolytes WNL  Plan:  --No electrolyte replacement indicated at this time --Follow-up electrolytes with AM labs tomorrow  Tressie Ellis 05/23/2023 7:52 AM

## 2023-05-23 NOTE — Progress Notes (Addendum)
Patient ID: Sonya Sanchez, female   DOB: [birth year], 67 y.o.   MRN: [id number]     Advanced Heart Failure Rounding Note  PCP-Cardiologist: Julien Nordmann, MD   Subjective:    Patient is on 2L [place] today.  States that breathing is overall better. Weight has trended down.  No diuretics yesterday, creatinine and BUN trending down.    CVP 10 on my read today.   She remains in rate-controlled atrial fibrillation.    Objective:   Weight Range: 121.3 kg Body mass index is 48.9 kg/m.   Vital Signs:   Temp:  [97.8 F (36.6 C)] 97.8 F (36.6 C) (06/23 2100) Pulse Rate:  [52-90] 74 (06/24 0600) Resp:  [8-19] 8 (06/24 0600) BP: (117-153)/(37-76) 150/60 (06/24 0600) SpO2:  [89 %-98 %] 96 % (06/24 0600) Arterial Line BP: (109-165)/(36-65) 165/65 (06/24 0600) Weight:  [121.3 kg] 121.3 kg (06/24 0500) Last BM Date : 05/22/23  Weight change: Filed Weights   05/21/23 0500 05/22/23 0251 05/23/23 0500  Weight: 125.9 kg 125.4 kg 121.3 kg    Intake/Output:   Intake/Output Summary (Last 24 hours) at 05/23/2023 0758 Last data filed at 05/23/2023 0444 Gross per 24 hour  Intake 1173.91 ml  Output 1290 ml  Net -116.09 ml      Physical Exam    General:  Well appearing. No resp difficulty HEENT: Normal Neck: Supple. JVP difficult due to thick neck. Carotids 2+ bilat; no bruits. No lymphadenopathy or thyromegaly appreciated. Cor: PMI nondisplaced. Irregular rate & rhythm. 3/6 SEM RUSB. Lungs: Decreased at bases.  Abdomen: Soft, nontender, nondistended. No hepatosplenomegaly. No bruits or masses. Good bowel sounds. Extremities: No cyanosis, clubbing, rash. 1+ edema to knees.  Neuro: Alert & orientedx3, cranial nerves grossly intact. moves all 4 extremities w/o difficulty. Affect pleasant   Telemetry   Atrial fibrillation rate 60s.    Labs    CBC Recent Labs    05/22/23 0352 05/23/23 0442  WBC 8.9 6.8  HGB 9.3* 8.6*  HCT 28.7* 27.1*  MCV 90.8 90.9  PLT 181 157   Basic  Metabolic Panel Recent Labs    24/40/10 0352 05/23/23 0442  NA 142 142  K 3.8 4.0  CL 102 104  CO2 27 27  GLUCOSE 261* 213*  BUN 94* 85*  CREATININE 1.60* 1.53*  CALCIUM 8.3* 8.4*  MG 1.8 1.9  PHOS 4.3 3.5   Liver Function Tests Recent Labs    05/22/23 0352 05/23/23 0442  ALBUMIN 2.5* 2.4*   No results for input(s): "LIPASE", "AMYLASE" in the last 72 hours. Cardiac Enzymes No results for input(s): "CKTOTAL", "CKMB", "CKMBINDEX", "TROPONINI" in the last 72 hours.  BNP: BNP (last 3 results) Recent Labs    04/29/23 1507  BNP 386.8*    ProBNP (last 3 results) No results for input(s): "PROBNP" in the last 8760 hours.   D-Dimer No results for input(s): "DDIMER" in the last 72 hours. Hemoglobin A1C No results for input(s): "HGBA1C" in the last 72 hours. Fasting Lipid Panel No results for input(s): "CHOL", "HDL", "LDLCALC", "TRIG", "CHOLHDL", "LDLDIRECT" in the last 72 hours. Thyroid Function Tests No results for input(s): "TSH", "T4TOTAL", "T3FREE", "THYROIDAB" in the last 72 hours.  Invalid input(s): "FREET3"  Other results:   Imaging    No results found.   Medications:     Scheduled Medications:  apixaban  5 mg Oral BID   atorvastatin  80 mg Oral Daily   Chlorhexidine Gluconate Cloth  6 each Topical Daily   feeding supplement (  NEPRO CARB STEADY)  237 mL Oral TID BM   fluticasone furoate-vilanterol  1 puff Inhalation Daily   insulin aspart  0-20 Units Subcutaneous Q4H   insulin glargine-yfgn  30 Units Subcutaneous BID   ipratropium-albuterol  3 mL Nebulization TID   methylPREDNISolone (SOLU-MEDROL) injection  40 mg Intravenous Q12H   midodrine  10 mg Oral TID WC   multivitamin with minerals  1 tablet Oral Daily   mupirocin ointment  1 Application Nasal BID   mouth rinse  15 mL Mouth Rinse 4 times per day   pantoprazole (PROTONIX) IV  40 mg Intravenous Daily   polyethylene glycol  17 g Per Tube Daily   sennosides  10 mL Per Tube BID   sodium  chloride flush  3 mL Intravenous Q12H   torsemide  20 mg Oral Daily    Infusions:  amiodarone 30 mg/hr (05/23/23 0444)   piperacillin-tazobactam (ZOSYN)  IV 12.5 mL/hr at 05/23/23 0444   vancomycin Stopped (05/21/23 1159)    PRN Medications: acetaminophen **OR** acetaminophen, mouth rinse, polyethylene glycol, senna-docusate    Patient Profile   67 y.o. female with history of CAD with CTO of the distal RCA, HFpEF, moderate aortic stenosis, mild mitral stenosis, normocytic anemia, DVT/PE on Eliquis, DM2, obesity with OSA/OHS and dermatomyositis who we are seeing for evaluation of acute on chronic HFpEF that required mechanical ventilation, now extubated on 05/18/23.  She went into AF on 6/19.   Assessment/Plan   1. Acute on chronic diastolic CHF: Echo (6/24) with EF 60-655, mild LVH, normal RV, mild mitral stenosis mean gradient 7, moderate aortic stenosis with AVA 1.08 cm^2 mean gradient 10, IVC dilated.  She has been diuresed this admission but currently off diuretics with rise in creatinine.  BUN/creatinine trending down today, 85/1.53.  She is on Lasix 40 mg daily + Jardiance at home.  CVP 10 on my read today.  Atrial fibrillation contributes to CHF.  Weight has trended down and she feels overall better.  - Would start torsemide 20 mg daily today.  - Hold off on Jardiance for now with recent E coli UTI.  - Restart spironolactone if creatinine continues to trend down and BP remains stable. .  2.  Septic shock: Hospital-acquired PNA versus E coli urosepsis.  She has been titrated off phenylephrine and is on midodrine 15 tid. PCT was elevated.   - She remains on Zosyn with E coli in urine resistant to cefepime.  - BP stable today, start titrating down midodrine to 10 mg tid.  3. Acute on chronic hypercarbic hypoxemic respiratory failure: Multifactorial with OHS/OSA, suspected PNA, CHF.  Baseline on home oxygen + CPAP.  - She is on Solumedrol, will need to titrate off steroids per primary  team.  - Continue oxygen 2L [place] + Bipap, suspect now at baseline oxygen requirement.  - Gentle diuresis with torsemide as above.  4. Atrial fibrillation: New onset, first noted on 6/19.  She remains in rate-controlled AF on amiodarone gtt and apixaban.  AF likely worsens her CHF.   - Continue amiodarone gtt for now, 30 mg/hr. - Continue apixaban.  - Would favor eventual TEE-guided DCCV. Will need TEE as she has missed several doses of apixaban over the last month. Would like to make sure respiratory status and BP are very stable prior given higher risk with sedation.  Will aim for Wednesday morning to give her a bit more time to settle down.  Discussed risks/benefits with patient and she agrees to procedure.  5. H/o DVT/PE: Poor mobility at home, wheelchair-bound.  - Chronic apixaban.  6. Aortic stenosis: Likely moderate based on cath and echo. Follow.  7. CAD: NSTEMI in 6/19, cath showed CTO distal RCA and 70-80% small D1 not amenable to intervention. No chest pain.  - Continue atorvastatin.  - No ASA given apixaban use.  8. H/o dermatomyositis: Azathioprine on hold.  9. Anemia: No overt bleeding.  Check Fe studies.  Would benefit from IV Fe at this point if low, think infection has been treated sufficiently to receive.  10. AKI on CKD stage 3: Creatinine trending down.  Follow closely with restart of diuretic.    Length of Stay: 9  Marca Ancona, MD  05/23/2023, 7:58 AM  Advanced Heart Failure Team Pager [phone] (M-F; 7a - 5p)  Please contact [facility] for night-coverage after hours (5p -7a ) and weekends on [email or web address]

## 2023-05-23 NOTE — Progress Notes (Signed)
Nutrition Follow-up  DOCUMENTATION CODES:   Obesity unspecified  INTERVENTION:   -D/c Nepro -MVI with minerals daily -Ensure Enlive po TID, each supplement provides 350 kcal and 20 grams of protein -Magic cup TID with meals, each supplement provides 290 kcal and 9 grams of protein   NUTRITION DIAGNOSIS:   Inadequate oral intake related to inability to eat (pt sedated and ventilated) as evidenced by NPO status.  Ongoing; advanced to PO diet on 05/21/23  GOAL:   Patient will meet greater than or equal to 90% of their needs  Progressing  MONITOR:   Diet advancement  REASON FOR ASSESSMENT:   Ventilator    ASSESSMENT:   67 y/o female with h/o morbid obesity, HTN, chronic respiratory failure on 2L O2 at baseline, OSA on CPAP, bedbound (hasn't walked in 3 years), CAD, PE/DVT, HLD, DM, IBS-C, gout and recent admission for NSTEMI requiring left heart cath (found to have chronic occlusion of her distal RCA and mod AS) and who is now admitted with volume overload, AKI and UTI.  6/22- s/p BSE- advanced to dysphagia 2 diet with nectar thick liquids 6/23- s/p BSE- advanced to dysphagia 2 diet with thin liquids  Reviewed I/O's: -116 ml x 24 hours and -2.4 L since admission  UOP: 1.3 L x 24 hours   Case discussed with SLP; pt being treated for thrush. Pt not eating muhc, but drinking well.   Spoke with pt at bedside, who reports feeling a little better. She consumed 75% of eggs and most of the liquids on her breakfast tray. Documented meal completions 25-50%. Pt reports she does not have much of an appetite, but has been drinking more than she is eating. She has tried the Nepro shakes and is amenable to change supplements for improved flavor options. Discussed importance of good meal and supplement intake to promote healing.   Reviewed wt hx; pt has experienced a 6.1% wt loss over the past week. Suspect diuresis is contributing to weight loss. Noted pt -2.4 L since admission.  Medications reviewed and include miralax, senokot, demadex, and solu-medrol.   Medications reviewed and include miralax, senokot, demadex, and solu-medrol.   Labs reviewed: CBGS: 180-206 (inpatient orders for glycemic control are 0-20 units insulin aspart every 4 hours and 30 units insulin glargine-yfgn BID).    Diet Order:   Diet Order             DIET DYS 2 Room service appropriate? Yes; Fluid consistency: Thin  Diet effective now                   EDUCATION NEEDS:   No education needs have been identified at this time  Skin:  Skin Assessment: Skin Integrity Issues: Skin Integrity Issues:: Other (Comment) Other: MASD to bilateral groin, thighs, and breasts  Last BM:  05/22/23  Height:   Ht Readings from Last 1 Encounters:  05/17/23 5' 2.01" (1.575 m)    Weight:   Wt Readings from Last 1 Encounters:  05/23/23 121.3 kg    Ideal Body Weight:  50 kg  BMI:  Body mass index is 48.9 kg/m.  Estimated Nutritional Needs:   Kcal:  1750-1950  Protein:  100-115 grams  Fluid:  1.8-2.0 L    Levada Schilling, RD, LDN, CDCES Registered Dietitian II Certified Diabetes Care and Education Specialist Please refer to [facility] for RD and/or RD on-call/weekend/after hours pager

## 2023-05-23 NOTE — Progress Notes (Signed)
[facility] Progress Note  Patient: Sonya Sanchez    ZOX:[id number]  DOA: 05/13/2023     Date of Service: the patient was seen and examined on 05/23/2023  Chief Complaint  Patient presents with   Abnormal Lab    Pt was sent here from [facility] d/t Potassium results of 6.4 and BUN 134 today. Pt denies any pain   Brief hospital course: 67 yo F with obesity, from nursing home. She has as history of PE, DVT, aortic stenosis, HTN, chronic bronchitis, OSA, AKI, dermatomyositis, DM2, dyslipidemia, came in due to worsening renal function noted on bloodwork. She was noted to be lethargic, denied pain anywhere. She was found to have severe hypercapnic acidemia. She was placed on BIPAP. PCCM was consulted to due AMS lethargy on BIPAP.  Patient got intubated and extubated on 05/18/2023.  Significant hospital events and procedures 05/15/23 central line placed, nephrology, cardiology consult 05/16/2023-Lasix drip increased, metolazone added 6/18-started antibiotics due to increasing WBC count, pancultures sent 6/19 extubated.  Had respiratory distress and was put on BiPAP.  Started amnio for rapid A-fib. 6/20: lasix drip stopped. 6/21: rested off BPAP. Phenyl drip.  6/22: started on midodrine.   Assessment and Plan:  # Acute on chronic diastolic heart failure Echo (6/24) with EF 60-655, mild LVH, normal RV, mild mitral stenosis mean gradient 7, moderate aortic stenosis with AVA 1.08 cm^2 mean gradient 10, IVC dilated.  Patient was on Lasix 40 mg and Jardiance at home Appreciate cards eval and recs Monitor CVP S/p diuresis which was held on 6/23 due to elevated creatinine. 6/24 started torsemide 20 mg p.o. daily as per cardiology   # Atrial fibrillation Continue amiodarone drip and anticoagulation with Eliquis Follow cardiology for further management  AKI secondary to IV contrast, ATN Hyperkalemia-resolved Creatinine 1.53, gradually improving Monitor renal functions and  urine output daily   Sepsis.  Possible sources are hospital-acquired pneumonia versus e coli urosepsis Right lower lobe infiltrate on chest x-ray MRSA positive- s/p vancomycin DC'd on 6/24 after a.m. dose and started doxycycline 100 mg p.o. twice daily for 7 days Continue zosyn as E coli in urine is resistant to cefepime S/p Neo-Synephrine Procalcitonin is elevated.   I suspect that some of this is diastolic dysfunction; starting midodrine (6/22) to help with BP support Follow repeat blood cultures  Acute on chronic hypoxic, hypercarbic respiratory failure secondary to CHF History of OSA, morbid obesity Required BiPAP postextubation. transition from BiPAP to high flow nasal cannula  Continue BiPAP at night and during sleep for history of severe OSA Continue supplemental O2 nation and gradually wean off   Dermatomyositis Monitor CK Off azathioprine due to concern for sepsis.   H/O DVT, PE Continue Eliquis   Diabetes mellitus SSI coverage Hyperglycemia remains a  challenge Increased Lantus 30u BID  Morbid obesity Body mass index is 48.9 kg/m.  Nutrition Problem: Inadequate oral intake Etiology: inability to eat (pt sedated and ventilated) Interventions: Interventions: Ensure Enlive (each supplement provides 350kcal and 20 grams of protein), MVI, Magic cup   Diet: Dysphagia 2 diet DVT Prophylaxis: Therapeutic Anticoagulation with Eliquis    Advance goals of care discussion: Full code  Family Communication: family was not present at bedside, at the time of interview.  The pt provided permission to discuss medical plan with the family. Opportunity was given to ask question and all questions were answered satisfactorily.   Disposition:  Pt is from Home, admitted with sepsis, diastolic heart failure, volume overload, AKI, still has  AKI, volume overload, A-fib with RVR on amiodarone IV infusion, which precludes a safe discharge. Discharge to home with home PT versus SNF TBD  after improvement  Subjective: No significant events overnight, patient was downgraded from the ICU, still has mild shortness of breath, denies any chest pain or palpitation, no any other active issues.  Physical Exam: General: NAD, lying comfortably Appear in no distress, affect appropriate Eyes: PERRLA ENT: Oral Mucosa Clear, moist  Neck: no JVD,  Cardiovascular: S1 and S2 Present, no Murmur,  Respiratory: good respiratory effort, Bilateral Air entry equal and Decreased, bibasilar crackles, and mild wheezes Abdomen: Bowel Sound present, Soft and no tenderness,  Skin: no rashes Extremities: 4+ pedal edema, no calf tenderness Neurologic: without any new focal findings Gait not checked due to patient safety concerns  Vitals:   05/23/23 1300 05/23/23 1335 05/23/23 1400 05/23/23 1500  BP: (!) 111/48  (!) 132/41 (!) 126/53  Pulse: 86  90 86  Resp: 19  (!) 21 (!) 21  Temp:      TempSrc:      SpO2: 94% 98% 93% 92%  Weight:      Height:        Intake/Output Summary (Last 24 hours) at 05/23/2023 1626 Last data filed at 05/23/2023 1500 Gross per 24 hour  Intake 967.58 ml  Output 2250 ml  Net -1282.42 ml   Filed Weights   05/21/23 0500 05/22/23 0251 05/23/23 0500  Weight: 125.9 kg 125.4 kg 121.3 kg    Data Reviewed: I have personally reviewed and interpreted daily labs, tele strips, imagings as discussed above. I reviewed all nursing notes, pharmacy notes, vitals, pertinent old records I have discussed plan of care as described above with RN and patient/family.  CBC: Recent Labs  Lab 05/19/23 0413 05/20/23 0421 05/21/23 0320 05/22/23 0352 05/23/23 0442  WBC 7.1 10.0 11.4* 8.9 6.8  HGB 9.1* 8.8* 9.2* 9.3* 8.6*  HCT 27.6* 27.6* 28.9* 28.7* 27.1*  MCV 89.0 91.1 91.5 90.8 90.9  PLT 154 204 163 181 157   Basic Metabolic Panel: Recent Labs  Lab 05/17/23 0428 05/18/23 0355 05/19/23 0413 05/20/23 0421 05/21/23 0320 05/22/23 0352 05/23/23 0442  NA 134*   < > 136 136   134* 138 142 142  K 4.1   < > 4.1 4.2  4.3 3.9 3.8 4.0  CL 96*   < > 99 101  98 101 102 104  CO2 19*   < > 23 23  22 27 27 27   GLUCOSE 223*   < > 306* 241*  242* 185* 261* 213*  BUN 100*   < > 100* 97*  95* 93* 94* 85*  CREATININE 1.98*   < > 1.75* 1.66*  1.73* 1.74* 1.60* 1.53*  CALCIUM 7.8*   < > 7.8* 7.8*  8.2* 8.1* 8.3* 8.4*  MG 1.8  --  1.8 2.1  --  1.8 1.9  PHOS 6.6*  --  6.8* 6.2* 5.9* 4.3 3.5   < > = values in this interval not displayed.    Studies: No results found.  Scheduled Meds:  apixaban  5 mg Oral BID   atorvastatin  80 mg Oral Daily   Chlorhexidine Gluconate Cloth  6 each Topical Daily   feeding supplement  237 mL Oral TID BM   fluticasone furoate-vilanterol  1 puff Inhalation Daily   insulin aspart  0-20 Units Subcutaneous Q4H   insulin glargine-yfgn  30 Units Subcutaneous BID   ipratropium-albuterol  3 mL Nebulization TID  methylPREDNISolone (SOLU-MEDROL) injection  40 mg Intravenous Q12H   midodrine  10 mg Oral TID WC   multivitamin with minerals  1 tablet Oral Daily   mupirocin ointment  1 Application Nasal BID   mouth rinse  15 mL Mouth Rinse 4 times per day   [START ON 05/24/2023] pantoprazole  40 mg Oral Daily   [START ON 05/24/2023] polyethylene glycol  17 g Oral Daily   senna  1 tablet Oral BID   sodium chloride flush  3 mL Intravenous Q12H   torsemide  20 mg Oral Daily   Continuous Infusions:  amiodarone 30 mg/hr (05/23/23 1500)   piperacillin-tazobactam (ZOSYN)  IV 3.375 g (05/23/23 1505)   vancomycin Stopped (05/23/23 1149)   PRN Meds: acetaminophen **OR** acetaminophen, mouth rinse, polyethylene glycol, senna-docusate  Time spent: 55 minutes  Author: Gillis Santa. MD [facility] 05/23/2023 4:26 PM  To reach On-call, see care teams to locate the attending and reach out to them via [email or web address]. If 7PM-7AM, please contact night-coverage If you still have difficulty reaching the attending provider, please page the [facility]  (Director on Call) for [facility] on amion for assistance.

## 2023-05-23 NOTE — Progress Notes (Signed)
PT Cancellation Note  Patient Details Name: Sonya Sanchez MRN: [id number] DOB: 1956-11-05   Cancelled Treatment:    Reason Eval/Treat Not Completed: PT screened, no needs identified, will sign off. PT performed evaluation on 6/20 where it was determined patient was at baseline level and had no need for skilled physical therapy. Per patient admission they were max +2 for bed mobility, hoyer lift for transfers and assist for mobility with wheelchair. Patient from LTC facility with plans to return after discharge. Will communicate with nursing needs for mobility/turning.   Malachi Carl, SPT  Malachi Carl 05/23/2023, 8:58 AM

## 2023-05-24 DIAGNOSIS — N179 Acute kidney failure, unspecified: Secondary | ICD-10-CM | POA: Diagnosis not present

## 2023-05-24 DIAGNOSIS — I4891 Unspecified atrial fibrillation: Secondary | ICD-10-CM

## 2023-05-24 DIAGNOSIS — I5033 Acute on chronic diastolic (congestive) heart failure: Secondary | ICD-10-CM | POA: Diagnosis not present

## 2023-05-24 DIAGNOSIS — Z7189 Other specified counseling: Secondary | ICD-10-CM | POA: Diagnosis not present

## 2023-05-24 LAB — BLOOD GAS, ARTERIAL
Allens test (pass/fail): POSITIVE — AB
Delivery systems: POSITIVE
Drawn by: 30136
Expiratory PAP: 8 cmH2O
FIO2: 70 %
O2 Saturation: 98.1 %
RATE: 10 resp/min
pCO2 arterial: 54 mmHg — ABNORMAL HIGH (ref 32–48)
pO2, Arterial: 93 mmHg (ref 83–108)

## 2023-05-24 LAB — COOXEMETRY PANEL
Carboxyhemoglobin: 1.5 % (ref 0.5–1.5)
Methemoglobin: 1 % (ref 0.0–1.5)
O2 Saturation: 99 %
Total hemoglobin: 10.7 g/dL — ABNORMAL LOW (ref 12.0–16.0)
Total oxygen content: 96.5 %

## 2023-05-24 LAB — CBC
HCT: 29.1 % — ABNORMAL LOW (ref 36.0–46.0)
Hemoglobin: 9.4 g/dL — ABNORMAL LOW (ref 12.0–15.0)
MCH: 29.1 pg (ref 26.0–34.0)
MCHC: 32.3 g/dL (ref 30.0–36.0)
MCV: 90.1 fL (ref 80.0–100.0)
Platelets: 184 10*3/uL (ref 150–400)
RBC: 3.23 MIL/uL — ABNORMAL LOW (ref 3.87–5.11)
RDW: 19.5 % — ABNORMAL HIGH (ref 11.5–15.5)
WBC: 7.7 10*3/uL (ref 4.0–10.5)
nRBC: 0 % (ref 0.0–0.2)

## 2023-05-24 LAB — GLUCOSE, CAPILLARY
Glucose-Capillary: 166 mg/dL — ABNORMAL HIGH (ref 70–99)
Glucose-Capillary: 171 mg/dL — ABNORMAL HIGH (ref 70–99)
Glucose-Capillary: 199 mg/dL — ABNORMAL HIGH (ref 70–99)
Glucose-Capillary: 243 mg/dL — ABNORMAL HIGH (ref 70–99)
Glucose-Capillary: 259 mg/dL — ABNORMAL HIGH (ref 70–99)
Glucose-Capillary: 277 mg/dL — ABNORMAL HIGH (ref 70–99)
Glucose-Capillary: 313 mg/dL — ABNORMAL HIGH (ref 70–99)

## 2023-05-24 LAB — BASIC METABOLIC PANEL
Anion gap: 12 (ref 5–15)
BUN: 80 mg/dL — ABNORMAL HIGH (ref 8–23)
CO2: 27 mmol/L (ref 22–32)
Calcium: 8.7 mg/dL — ABNORMAL LOW (ref 8.9–10.3)
Chloride: 101 mmol/L (ref 98–111)
Creatinine, Ser: 1.41 mg/dL — ABNORMAL HIGH (ref 0.44–1.00)
GFR, Estimated: 40 mL/min — ABNORMAL LOW (ref >60)
Glucose, Bld: 188 mg/dL — ABNORMAL HIGH (ref 70–99)
Potassium: 4 mmol/L (ref 3.5–5.1)
Sodium: 140 mmol/L (ref 135–145)

## 2023-05-24 LAB — MAGNESIUM: Magnesium: 1.8 mg/dL (ref 1.7–2.4)

## 2023-05-24 LAB — PHOSPHORUS: Phosphorus: 2.9 mg/dL (ref 2.5–4.6)

## 2023-05-24 LAB — TSH: TSH: 3.918 u[IU]/mL (ref 0.350–4.500)

## 2023-05-24 LAB — T4, FREE: Free T4: 1.03 ng/dL (ref 0.61–1.12)

## 2023-05-24 MED ORDER — SODIUM CHLORIDE 0.9 % IV SOLN: INTRAVENOUS | Status: DC

## 2023-05-24 MED ORDER — SPIRONOLACTONE 25 MG PO TABS
25.0000 mg | ORAL_TABLET | Freq: Every day | ORAL | Status: DC
  Administered 2023-05-24 – 2023-05-27 (×4): 25 mg via ORAL
  Filled 2023-05-24 (×4): qty 1

## 2023-05-24 MED ORDER — MIDODRINE HCL 5 MG PO TABS
5.0000 mg | ORAL_TABLET | Freq: Three times a day (TID) | ORAL | Status: DC
  Filled 2023-05-24: qty 1

## 2023-05-24 MED ORDER — MAGNESIUM SULFATE 2 GM/50ML IV SOLN
2.0000 g | Freq: Once | INTRAVENOUS | Status: AC
  Administered 2023-05-24: 2 g via INTRAVENOUS
  Filled 2023-05-24: qty 50

## 2023-05-24 MED ORDER — MELATONIN 5 MG PO TABS
5.0000 mg | ORAL_TABLET | Freq: Every day | ORAL | Status: DC
  Administered 2023-05-24 – 2023-05-26 (×3): 5 mg via ORAL
  Filled 2023-05-24 (×3): qty 1

## 2023-05-24 NOTE — Progress Notes (Signed)
[facility] Progress Note  Patient: Sonya Sanchez    GEX:[id number]  DOA: 05/13/2023     Date of Service: the patient was seen and examined on 05/24/2023  Chief Complaint  Patient presents with   Abnormal Lab    Pt was sent here from [facility] d/t Potassium results of 6.4 and BUN 134 today. Pt denies any pain   Brief hospital course: 67 yo F with obesity, from nursing home. She has as history of PE, DVT, aortic stenosis, HTN, chronic bronchitis, OSA, AKI, dermatomyositis, DM2, dyslipidemia, came in due to worsening renal function noted on bloodwork. She was noted to be lethargic, denied pain anywhere. She was found to have severe hypercapnic acidemia. She was placed on BIPAP. PCCM was consulted to due AMS lethargy on BIPAP.  Patient got intubated and extubated on 05/18/2023.  Significant hospital events and procedures 05/15/23 central line placed, nephrology, cardiology consult 05/16/2023-Lasix drip increased, metolazone added 6/18-started antibiotics due to increasing WBC count, pancultures sent 6/19 extubated.  Had respiratory distress and was put on BiPAP.  Started amnio for rapid A-fib. 6/20: lasix drip stopped. 6/21: rested off BPAP. Phenyl drip.  6/22: started on midodrine.   Assessment and Plan:  # Acute on chronic diastolic heart failure Echo (6/24) with EF 60-655, mild LVH, normal RV, mild mitral stenosis mean gradient 7, moderate aortic stenosis with AVA 1.08 cm^2 mean gradient 10, IVC dilated.  Patient was on Lasix 40 mg and Jardiance at home Appreciate cards eval and recs Monitor CVP S/p diuresis which was held on 6/23 due to elevated creatinine. 6/24 started torsemide 20 mg p.o. daily as per cardiology   # Atrial fibrillation Continue amiodarone drip and anticoagulation with Eliquis Follow cardiology for further management  AKI secondary to IV contrast, ATN Hyperkalemia-resolved Creatinine 1.4, gradually improving Monitor renal functions and  urine output daily   Sepsis.  Possible sources are hospital-acquired pneumonia versus e coli urosepsis Right lower lobe infiltrate on chest x-ray MRSA positive- s/p vancomycin DC'd on 6/24 after a.m. dose and started doxycycline 100 mg p.o. twice daily for 7 days, Continue zosyn as E coli in urine is resistant to cefepime.  S/p Neo-Synephrine. Procalcitonin is elevated.   I suspect that some of this is diastolic dysfunction; starting midodrine (6/22) to help with BP support 6/25 midodrine 5 mg p.o. 3 times daily with holding parameters, blood pressure seems to be improving 6/25  repeat blood cultures NGTD  Acute on chronic hypoxic, hypercarbic respiratory failure secondary to CHF History of OSA, morbid obesity Required BiPAP postextubation. transition from BiPAP to high flow nasal cannula  Continue BiPAP at night and during sleep for history of severe OSA Continue supplemental O2 nation and gradually wean off   Dermatomyositis Monitor CK Off azathioprine due to concern for sepsis.   H/O DVT, PE Continue Eliquis   Diabetes mellitus SSI coverage Hyperglycemia remains a  challenge Increased Lantus 30u BID  Morbid obesity Body mass index is 45.84 kg/m.  Nutrition Problem: Inadequate oral intake Etiology: inability to eat (pt sedated and ventilated) Interventions: Interventions: Ensure Enlive (each supplement provides 350kcal and 20 grams of protein), MVI, Magic cup   Diet: Dysphagia 2 diet DVT Prophylaxis: Therapeutic Anticoagulation with Eliquis    Advance goals of care discussion: Full code  Family Communication: family was not present at bedside, at the time of interview.  The pt provided permission to discuss medical plan with the family. Opportunity was given to ask question and all questions were answered  satisfactorily.   Disposition:  Pt is from Home, admitted with sepsis, diastolic heart failure, volume overload, AKI, still has AKI, volume overload, A-fib with RVR  on amiodarone IV infusion, which precludes a safe discharge. Discharge to home with home PT versus SNF TBD after improvement  Subjective: No significant events overnight, patient was resting comfortably, feels improvement, stated she is just nervous about the procedure tomorrow, patient is scheduled for cardioversion and TEE.   Physical Exam: General: NAD, lying comfortably Appear in no distress, affect appropriate Eyes: PERRLA ENT: Oral Mucosa Clear, moist  Neck: no JVD,  Cardiovascular: Irregular rhythm, no Murmur,  Respiratory: good respiratory effort, Bilateral Air entry equal and Decreased, bibasilar crackles, and mild wheezes Abdomen: Bowel Sound present, Soft and no tenderness,  Skin: no rashes Extremities: 4+ pedal edema, no calf tenderness Neurologic: without any new focal findings Gait not checked due to patient safety concerns  Vitals:   05/24/23 1200 05/24/23 1300 05/24/23 1400 05/24/23 1500  BP: (!) 173/60 (!) 165/5 (!) 162/52 (!) 141/48  Pulse: 72 95 82 92  Resp: 20 19 19 20   Temp: 98.5 F (36.9 C)     TempSrc: Oral     SpO2: 97% 96% 95% 95%  Weight:      Height:        Intake/Output Summary (Last 24 hours) at 05/24/2023 1540 Last data filed at 05/24/2023 1500 Gross per 24 hour  Intake 1562.54 ml  Output 2400 ml  Net -837.46 ml   Filed Weights   05/22/23 0251 05/23/23 0500 05/24/23 0500  Weight: 125.4 kg 121.3 kg 113.7 kg    Data Reviewed: I have personally reviewed and interpreted daily labs, tele strips, imagings as discussed above. I reviewed all nursing notes, pharmacy notes, vitals, pertinent old records I have discussed plan of care as described above with RN and patient/family.  CBC: Recent Labs  Lab 05/20/23 0421 05/21/23 0320 05/22/23 0352 05/23/23 0442 05/24/23 0533  WBC 10.0 11.4* 8.9 6.8 7.7  HGB 8.8* 9.2* 9.3* 8.6* 9.4*  HCT 27.6* 28.9* 28.7* 27.1* 29.1*  MCV 91.1 91.5 90.8 90.9 90.1  PLT 204 163 181 157 184   Basic Metabolic  Panel: Recent Labs  Lab 05/19/23 0413 05/20/23 0421 05/21/23 0320 05/22/23 0352 05/23/23 0442 05/24/23 0533  NA 136 136  134* 138 142 142 140  K 4.1 4.2  4.3 3.9 3.8 4.0 4.0  CL 99 101  98 101 102 104 101  CO2 23 23  22 27 27 27 27   GLUCOSE 306* 241*  242* 185* 261* 213* 188*  BUN 100* 97*  95* 93* 94* 85* 80*  CREATININE 1.75* 1.66*  1.73* 1.74* 1.60* 1.53* 1.41*  CALCIUM 7.8* 7.8*  8.2* 8.1* 8.3* 8.4* 8.7*  MG 1.8 2.1  --  1.8 1.9 1.8  PHOS 6.8* 6.2* 5.9* 4.3 3.5 2.9    Studies: No results found.  Scheduled Meds:  apixaban  5 mg Oral BID   atorvastatin  80 mg Oral Daily   Chlorhexidine Gluconate Cloth  6 each Topical Daily   doxycycline  100 mg Oral Q12H   feeding supplement  237 mL Oral TID BM   fluticasone furoate-vilanterol  1 puff Inhalation Daily   insulin aspart  0-20 Units Subcutaneous Q4H   insulin glargine-yfgn  30 Units Subcutaneous BID   ipratropium-albuterol  3 mL Nebulization BID   midodrine  5 mg Oral TID WC   multivitamin with minerals  1 tablet Oral Daily   mouth rinse  15 mL Mouth Rinse 4 times per day   pantoprazole  40 mg Oral Daily   polyethylene glycol  17 g Oral Daily   predniSONE  40 mg Oral Q breakfast   Followed by   [name] ON 05/27/2023] predniSONE  30 mg Oral Q breakfast   Followed by   [name] ON 05/30/2023] predniSONE  20 mg Oral Q breakfast   Followed by   [name] ON 06/02/2023] predniSONE  10 mg Oral Q breakfast   senna  1 tablet Oral BID   sodium chloride flush  3 mL Intravenous Q12H   spironolactone  25 mg Oral Daily   torsemide  20 mg Oral Daily   Continuous Infusions:  amiodarone 30 mg/hr (05/24/23 1500)   piperacillin-tazobactam (ZOSYN)  IV 3.375 g (05/24/23 1358)   PRN Meds: acetaminophen **OR** acetaminophen, mouth rinse, polyethylene glycol, senna-docusate  Time spent: 55 minutes  Author: Gillis Santa. MD [facility] 05/24/2023 3:40 PM  To reach On-call, see care teams to locate the attending and reach out  to them via [email or web address]. If 7PM-7AM, please contact night-coverage If you still have difficulty reaching the attending provider, please page the [facility] (Director on Call) for [facility] on amion for assistance.

## 2023-05-24 NOTE — Consult Note (Signed)
PHARMACY CONSULT NOTE  Pharmacy Consult for Electrolyte Monitoring and Replacement   Recent Labs: Potassium (mmol/L)  Date Value  05/24/2023 4.0   Magnesium (mg/dL)  Date Value  16/08/9603 1.8   Calcium (mg/dL)  Date Value  54/07/8118 8.7 (L)   Albumin (g/dL)  Date Value  14/78/2956 2.4 (L)  01/09/2018 4.0   Phosphorus (mg/dL)  Date Value  21/30/8657 2.9   Sodium (mmol/L)  Date Value  05/24/2023 140  01/09/2018 138   Assessment: Patient is a 67 y/o F with medical history including morbid obesity, limited functional capacity, nursing home resident admitted with altered mentation, AKI, acute CHF. Pharmacy consulted to assist with electrolyte monitoring and replacement as indicated.   Diuresis: Torsemide 20 mg daily  Goal of Therapy:  Potassium 4.0 - 5.1 mmol/L Magnesium 2.0 - 2.4 mg/dL All Other Electrolytes WNL  Plan:  --Mg 1.8, magnesium sulfate 2 g IV x 1 --Follow-up electrolytes with AM labs tomorrow  Tressie Ellis 05/24/2023 7:55 AM

## 2023-05-24 NOTE — Progress Notes (Signed)
   05/24/23 1400  Spiritual Encounters  Type of Visit Initial  Care provided to: Patient  Referral source Patient request  Reason for visit Religious ritual  OnCall Visit No  Spiritual Framework  Presenting Themes Meaning/purpose/sources of inspiration  Patient Stress Factors Major life changes  Interventions  Spiritual Care Interventions Made Prayer;Established relationship of care and support;Compassionate presence;Reflective listening  Intervention Outcomes  Outcomes Connection to spiritual care  Spiritual Care Plan  Spiritual Care Issues Still Outstanding Chaplain will continue to follow   Patient requested prayer for medical processor she is having on tomorrow. Pray and talked with patient let her know that Pastoral Care is around 24/7 if she needs someone to talk to. Patient seem satisfy with the Spiritual Care she receive.

## 2023-05-24 NOTE — Progress Notes (Signed)
Rounding Note    Patient Name: Sonya Sanchez Date of Encounter: 05/24/2023   [facility] Cardiologist: Julien Nordmann, MD   Subjective   Patient seen on a.m. rounds.  Denies any chest pain or increased shortness of breath.  A-line is being removed today due to malfunction.  She is currently continued on amiodarone drip.  -2.2 L in the last 24 hours.  Inpatient Medications    Scheduled Meds:  apixaban  5 mg Oral BID   atorvastatin  80 mg Oral Daily   Chlorhexidine Gluconate Cloth  6 each Topical Daily   doxycycline  100 mg Oral Q12H   feeding supplement  237 mL Oral TID BM   fluticasone furoate-vilanterol  1 puff Inhalation Daily   insulin aspart  0-20 Units Subcutaneous Q4H   insulin glargine-yfgn  30 Units Subcutaneous BID   ipratropium-albuterol  3 mL Nebulization BID   midodrine  5 mg Oral TID WC   multivitamin with minerals  1 tablet Oral Daily   mouth rinse  15 mL Mouth Rinse 4 times per day   pantoprazole  40 mg Oral Daily   polyethylene glycol  17 g Oral Daily   predniSONE  40 mg Oral Q breakfast   Followed by   [name] ON 05/27/2023] predniSONE  30 mg Oral Q breakfast   Followed by   [name] ON 05/30/2023] predniSONE  20 mg Oral Q breakfast   Followed by   [name] ON 06/02/2023] predniSONE  10 mg Oral Q breakfast   senna  1 tablet Oral BID   sodium chloride flush  3 mL Intravenous Q12H   spironolactone  25 mg Oral Daily   torsemide  20 mg Oral Daily   Continuous Infusions:  amiodarone 30 mg/hr (05/24/23 1500)   piperacillin-tazobactam (ZOSYN)  IV 3.375 g (05/24/23 1358)   PRN Meds: acetaminophen **OR** acetaminophen, mouth rinse, polyethylene glycol, senna-docusate   Vital Signs    Vitals:   05/24/23 1200 05/24/23 1300 05/24/23 1400 05/24/23 1500  BP: (!) 173/60 (!) 165/5 (!) 162/52 (!) 141/48  Pulse: 72 95 82 92  Resp: 20 19 19 20   Temp: 98.5 F (36.9 C)     TempSrc: Oral     SpO2: 97% 96% 95% 95%  Weight:      Height:        Intake/Output  Summary (Last 24 hours) at 05/24/2023 1609 Last data filed at 05/24/2023 1500 Gross per 24 hour  Intake 1534.57 ml  Output 2400 ml  Net -865.43 ml      05/24/2023    5:00 AM 05/23/2023    5:00 AM 05/22/2023    2:51 AM  Last 3 Weights  Weight (lbs) 250 lb 10.6 oz 267 lb 6.7 oz 276 lb 7.3 oz  Weight (kg) 113.7 kg 121.3 kg 125.4 kg      Telemetry    Rate controlled atrial fibrillation in the 70s- Personally Reviewed  ECG    No new tracings- Personally Reviewed  Physical Exam   GEN: No acute distress.   Neck: Unable to determine JVD due to body habitus Cardiac: IR IR,III/VI systolic murmur, without rubs or gallops.  Respiratory: Clear upper lung bases with diminished bases to auscultation bilaterally.  Rations remain unlabored on 2 L of O2 via nasal cannula. GI: Soft, nontender, obese, non-distended  MS: 1+ edema; No deformity. Neuro:  Nonfocal  Psych: Normal affect   Labs    High Sensitivity Troponin:   Recent Labs  Lab 04/30/23 1300 04/30/23  1437 05/01/23 0401 05/01/23 0908 05/01/23 1221  TROPONINIHS 1,678* 1,669* 958* 904* 859*     Chemistry Recent Labs  Lab 05/18/23 0355 05/19/23 0413 05/20/23 0421 05/21/23 0320 05/22/23 0352 05/23/23 0442 05/24/23 0533  NA 134* 136   < > 138 142 142 140  K 3.8 4.1   < > 3.9 3.8 4.0 4.0  CL 98 99   < > 101 102 104 101  CO2 22 23   < > 27 27 27 27   GLUCOSE 309* 306*   < > 185* 261* 213* 188*  BUN 95* 100*   < > 93* 94* 85* 80*  CREATININE 1.93* 1.75*   < > 1.74* 1.60* 1.53* 1.41*  CALCIUM 7.6* 7.8*   < > 8.1* 8.3* 8.4* 8.7*  MG  --  1.8   < >  --  1.8 1.9 1.8  PROT 6.1* 5.8*  --   --   --   --   --   ALBUMIN 2.7* 2.4*  2.5*   < > 2.6* 2.5* 2.4*  --   AST 12* 10*  --   --   --   --   --   ALT 9 9  --   --   --   --   --   ALKPHOS 53 49  --   --   --   --   --   BILITOT 0.8 0.7  --   --   --   --   --   GFRNONAA 28* 31*   < > 31* 35* 37* 40*  ANIONGAP 14 14   < > 10 13 11 12    < > = values in this interval not  displayed.    Lipids No results for input(s): "CHOL", "TRIG", "HDL", "LABVLDL", "LDLCALC", "CHOLHDL" in the last 168 hours.  Hematology Recent Labs  Lab 05/22/23 0352 05/23/23 0442 05/24/23 0533  WBC 8.9 6.8 7.7  RBC 3.16* 2.98* 3.23*  HGB 9.3* 8.6* 9.4*  HCT 28.7* 27.1* 29.1*  MCV 90.8 90.9 90.1  MCH 29.4 28.9 29.1  MCHC 32.4 31.7 32.3  RDW 19.5* 19.6* 19.5*  PLT 181 157 184   Thyroid  Recent Labs  Lab 05/24/23 0533  TSH 3.918  FREET4 1.03    BNPNo results for input(s): "BNP", "PROBNP" in the last 168 hours.  DDimer No results for input(s): "DDIMER" in the last 168 hours.   Radiology    No results found.  Cardiac Studies  LHC 05/04/23 Conclusions: Severe single-vessel coronary artery disease with chronic total occlusion of distal RCA with PDA and PL branches filling via left-to-right collaterals.  There is 70-80% stenosis of small-moderate first diagonal branch that is not well-suited to PCI. Mildly elevated left ventricular filling pressure (LVEDP 20 mmHg). Moderate aortic valve stenosis (mean gradient 24 mmHg). Small/stenotic right brachial veins and absent cephalic/basilic veins, not suitable for right heart catheterization.   Recommendations: Restart gentle diuresis. Aggressive secondary prevention of coronary artery disease. Restart apixaban 5 mg twice daily tomorrow morning if no evidence of bleeding/vascular injury.  TTE 05/01/23 1. Left ventricular ejection fraction, by estimation, is 60 to 65%. The  left ventricle has normal function. The left ventricle has no regional  wall motion abnormalities. There is mild concentric left ventricular  hypertrophy. Left ventricular diastolic  parameters are consistent with Grade II diastolic dysfunction  (pseudonormalization). Elevated left ventricular end-diastolic pressure.   2. Right ventricular systolic function is normal. The right ventricular  size is normal. There  is mildly elevated pulmonary artery systolic   pressure.   3. Left atrial size was moderately dilated.   4. The mitral valve is degenerative. No evidence of mitral valve  regurgitation. Mild mitral stenosis. The mean mitral valve gradient is 7.0  mmHg with average heart rate of 73 bpm. Moderate mitral annular  calcification.   5. The aortic valve is tricuspid. There is mild calcification of the  aortic valve. There is mild thickening of the aortic valve. Aortic valve  regurgitation is not visualized. Mild aortic valve stenosis. Aortic valve  area, by VTI measures 1.08 cm.  Aortic valve mean gradient measures 10.0 mmHg. Aortic valve Vmax measures  2.13 m/s.   6. The inferior vena cava is dilated in size with <50% respiratory  variability, suggesting right atrial pressure of 15 mmHg.   Comparison(s): EF 60-65.   Patient Profile     67 y.o. 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  Assessment & Plan    Chronic diastolic congestive heart failure -Echocardiogram revealed LVEF of 60-65%, mild LVH, normal RV, mild mitral stenosis, moderate aortic stenosis, dilated IVC -She has been diuresed at this admission and currently off of diuretics due to a rise in serum creatinine -Not currently a good candidate to restart Jardiance with recent E. coli UTI -Will restart spironolactone today with kidney function trending back down -Continued on torsemide 20 mg daily --2.2 L in the last 24 hours -Weights continue to trend down  -Continue with heart failure education -Daily weights, I's and O's, low-sodium diet  Septic shock/hospital-acquired PNA versus E. coli  urosepsis -Has been titrated off phenylephrine -Has become hypertensive with midodrine decreased to 5 mg 3 times daily with continued elevated blood pressures may be held and likely discontinued in a.m. -Continued management per IM  Acute on chronic hypercapnic hypoxic respiratory failure -Likely multifactorial with OHS/OSA, suspected pneumonia, CHF -Baseline home oxygen -Continue with oxygen therapy and titrate up to keeping oxygen saturations greater than equal to 92% -Continued management per IM  New onset atrial fibrillation -New onset first noted in 05/18/2023 -Continues to remain rate controlled on amiodarone drip at 30 mg/hr -A-fib likely worsening CHF -Continued on apixaban 5 mg twice daily for stroke prophylaxis -Scheduled for TEE guided DCCV on Wednesday she has missed several doses of apixaban -N.p.o. after midnight -Continue with telemetry monitoring  Aortic stenosis -Heart murmur noted on exam -Mild aortic stenosis noted on echocardiogram with moderate aortic noted on left heart catheterization with mean gradient of 24 mmHg -Continue to follow with surveillance studies -Continued on statin therapy  Coronary artery disease -Previous NSTEMI in 6/19, left heart catheterization revealed CTO of the distal RCA and 70 to 80% small D1 not amendable to intervention -Continues to deny chest discomfort -No ischemic changes noted on EKG -Continued on apixaban and lieu of aspirin and statin therapy -EKG as needed for pain or changes  History of DVT/PE -Continued on apixaban -Poor mobility at home where she is wheelchair-bound likely contributing  Anemia -Hemoglobin 9.4 -Remains stable with no overt signs of bleeding -Daily CBC  AKI on CKD stage III -Serum creatinine 1.41 -Continues to improve -Started spironolactone 25 mg daily -  Monitor urine output -Daily BMP -Monitor/trend/replete electrolytes as needed -Avoid nephrotoxic agents were able     For questions or  updates, please contact [facility] Please consult [email or web address] for contact info under        Signed, Jaidan Stachnik, NP  05/24/2023, 4:09 PM

## 2023-05-24 NOTE — H&P (View-Only) (Signed)
 Rounding Note    Patient Name: Sonya Sanchez Date of Encounter: 05/24/2023  [facility] Cardiologist: Timothy Gollan, MD   Subjective   Patient seen on a.m. rounds.  Denies any chest pain or increased shortness of breath.  A-line is being removed today due to malfunction.  She is currently continued on amiodarone drip.  -2.2 L in the last 24 hours.  Inpatient Medications    Scheduled Meds:  apixaban  5 mg Oral BID   atorvastatin  80 mg Oral Daily   Chlorhexidine Gluconate Cloth  6 each Topical Daily   doxycycline  100 mg Oral Q12H   feeding supplement  237 mL Oral TID BM   fluticasone furoate-vilanterol  1 puff Inhalation Daily   insulin aspart  0-20 Units Subcutaneous Q4H   insulin glargine-yfgn  30 Units Subcutaneous BID   ipratropium-albuterol  3 mL Nebulization BID   midodrine  5 mg Oral TID WC   multivitamin with minerals  1 tablet Oral Daily   mouth rinse  15 mL Mouth Rinse 4 times per day   pantoprazole  40 mg Oral Daily   polyethylene glycol  17 g Oral Daily   predniSONE  40 mg Oral Q breakfast   Followed by   [START ON 05/27/2023] predniSONE  30 mg Oral Q breakfast   Followed by   [START ON 05/30/2023] predniSONE  20 mg Oral Q breakfast   Followed by   [START ON 06/02/2023] predniSONE  10 mg Oral Q breakfast   senna  1 tablet Oral BID   sodium chloride flush  3 mL Intravenous Q12H   spironolactone  25 mg Oral Daily   torsemide  20 mg Oral Daily   Continuous Infusions:  amiodarone 30 mg/hr (05/24/23 1500)   piperacillin-tazobactam (ZOSYN)  IV 3.375 g (05/24/23 1358)   PRN Meds: acetaminophen **OR** acetaminophen, mouth rinse, polyethylene glycol, senna-docusate   Vital Signs    Vitals:   05/24/23 1200 05/24/23 1300 05/24/23 1400 05/24/23 1500  BP: (!) 173/60 (!) 165/5 (!) 162/52 (!) 141/48  Pulse: 72 95 82 92  Resp: 20 19 19 20  Temp: 98.5 F (36.9 C)     TempSrc: Oral     SpO2: 97% 96% 95% 95%  Weight:      Height:        Intake/Output  Summary (Last 24 hours) at 05/24/2023 1609 Last data filed at 05/24/2023 1500 Gross per 24 hour  Intake 1534.57 ml  Output 2400 ml  Net -865.43 ml      05/24/2023    5:00 AM 05/23/2023    5:00 AM 05/22/2023    2:51 AM  Last 3 Weights  Weight (lbs) 250 lb 10.6 oz 267 lb 6.7 oz 276 lb 7.3 oz  Weight (kg) 113.7 kg 121.3 kg 125.4 kg      Telemetry    Rate controlled atrial fibrillation in the 70s- Personally Reviewed  ECG    No new tracings- Personally Reviewed  Physical Exam   GEN: No acute distress.   Neck: Unable to determine JVD due to body habitus Cardiac: IR IR,III/VI systolic murmur, without rubs or gallops.  Respiratory: Clear upper lung bases with diminished bases to auscultation bilaterally.  Rations remain unlabored on 2 L of O2 via nasal cannula. GI: Soft, nontender, obese, non-distended  MS: 1+ edema; No deformity. Neuro:  Nonfocal  Psych: Normal affect   Labs    High Sensitivity Troponin:   Recent Labs  Lab 04/30/23 1300 04/30/23   1437 05/01/23 0401 05/01/23 0908 05/01/23 1221  TROPONINIHS 1,678* 1,669* 958* 904* 859*     Chemistry Recent Labs  Lab 05/18/23 0355 05/19/23 0413 05/20/23 0421 05/21/23 0320 05/22/23 0352 05/23/23 0442 05/24/23 0533  NA 134* 136   < > 138 142 142 140  K 3.8 4.1   < > 3.9 3.8 4.0 4.0  CL 98 99   < > 101 102 104 101  CO2 22 23   < > 27 27 27 27  GLUCOSE 309* 306*   < > 185* 261* 213* 188*  BUN 95* 100*   < > 93* 94* 85* 80*  CREATININE 1.93* 1.75*   < > 1.74* 1.60* 1.53* 1.41*  CALCIUM 7.6* 7.8*   < > 8.1* 8.3* 8.4* 8.7*  MG  --  1.8   < >  --  1.8 1.9 1.8  PROT 6.1* 5.8*  --   --   --   --   --   ALBUMIN 2.7* 2.4*  2.5*   < > 2.6* 2.5* 2.4*  --   AST 12* 10*  --   --   --   --   --   ALT 9 9  --   --   --   --   --   ALKPHOS 53 49  --   --   --   --   --   BILITOT 0.8 0.7  --   --   --   --   --   GFRNONAA 28* 31*   < > 31* 35* 37* 40*  ANIONGAP 14 14   < > 10 13 11 12   < > = values in this interval not  displayed.    Lipids No results for input(s): "CHOL", "TRIG", "HDL", "LABVLDL", "LDLCALC", "CHOLHDL" in the last 168 hours.  Hematology Recent Labs  Lab 05/22/23 0352 05/23/23 0442 05/24/23 0533  WBC 8.9 6.8 7.7  RBC 3.16* 2.98* 3.23*  HGB 9.3* 8.6* 9.4*  HCT 28.7* 27.1* 29.1*  MCV 90.8 90.9 90.1  MCH 29.4 28.9 29.1  MCHC 32.4 31.7 32.3  RDW 19.5* 19.6* 19.5*  PLT 181 157 184   Thyroid  Recent Labs  Lab 05/24/23 0533  TSH 3.918  FREET4 1.03    BNPNo results for input(s): "BNP", "PROBNP" in the last 168 hours.  DDimer No results for input(s): "DDIMER" in the last 168 hours.   Radiology    No results found.  Cardiac Studies  LHC 05/04/23 Conclusions: Severe single-vessel coronary artery disease with chronic total occlusion of distal RCA with PDA and PL branches filling via left-to-right collaterals.  There is 70-80% stenosis of small-moderate first diagonal branch that is not well-suited to PCI. Mildly elevated left ventricular filling pressure (LVEDP 20 mmHg). Moderate aortic valve stenosis (mean gradient 24 mmHg). Small/stenotic right brachial veins and absent cephalic/basilic veins, not suitable for right heart catheterization.   Recommendations: Restart gentle diuresis. Aggressive secondary prevention of coronary artery disease. Restart apixaban 5 mg twice daily tomorrow morning if no evidence of bleeding/vascular injury.  TTE 05/01/23 1. Left ventricular ejection fraction, by estimation, is 60 to 65%. The  left ventricle has normal function. The left ventricle has no regional  wall motion abnormalities. There is mild concentric left ventricular  hypertrophy. Left ventricular diastolic  parameters are consistent with Grade II diastolic dysfunction  (pseudonormalization). Elevated left ventricular end-diastolic pressure.   2. Right ventricular systolic function is normal. The right ventricular  size is normal. There   is mildly elevated pulmonary artery systolic   pressure.   3. Left atrial size was moderately dilated.   4. The mitral valve is degenerative. No evidence of mitral valve  regurgitation. Mild mitral stenosis. The mean mitral valve gradient is 7.0  mmHg with average heart rate of 73 bpm. Moderate mitral annular  calcification.   5. The aortic valve is tricuspid. There is mild calcification of the  aortic valve. There is mild thickening of the aortic valve. Aortic valve  regurgitation is not visualized. Mild aortic valve stenosis. Aortic valve  area, by VTI measures 1.08 cm.  Aortic valve mean gradient measures 10.0 mmHg. Aortic valve Vmax measures  2.13 m/s.   6. The inferior vena cava is dilated in size with <50% respiratory  variability, suggesting right atrial pressure of 15 mmHg.   Comparison(s): EF 60-65.   Patient Profile     67 y.o. 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  Assessment & Plan    Chronic diastolic congestive heart failure -Echocardiogram revealed LVEF of 60-65%, mild LVH, normal RV, mild mitral stenosis, moderate aortic stenosis, dilated IVC -She has been diuresed at this admission and currently off of diuretics due to a rise in serum creatinine -Not currently a good candidate to restart Jardiance with recent E. coli UTI -Will restart spironolactone today with kidney function trending back down -Continued on torsemide 20 mg daily --2.2 L in the last 24 hours -Weights continue to trend down  -Continue with heart failure education -Daily weights, I's and O's, low-sodium diet  Septic shock/hospital-acquired PNA versus E. coli  urosepsis -Has been titrated off phenylephrine -Has become hypertensive with midodrine decreased to 5 mg 3 times daily with continued elevated blood pressures may be held and likely discontinued in a.m. -Continued management per IM  Acute on chronic hypercapnic hypoxic respiratory failure -Likely multifactorial with OHS/OSA, suspected pneumonia, CHF -Baseline home oxygen -Continue with oxygen therapy and titrate up to keeping oxygen saturations greater than equal to 92% -Continued management per IM  New onset atrial fibrillation -New onset first noted in 05/18/2023 -Continues to remain rate controlled on amiodarone drip at 30 mg/hr -A-fib likely worsening CHF -Continued on apixaban 5 mg twice daily for stroke prophylaxis -Scheduled for TEE guided DCCV on Wednesday she has missed several doses of apixaban -N.p.o. after midnight -Continue with telemetry monitoring  Aortic stenosis -Heart murmur noted on exam -Mild aortic stenosis noted on echocardiogram with moderate aortic noted on left heart catheterization with mean gradient of 24 mmHg -Continue to follow with surveillance studies -Continued on statin therapy  Coronary artery disease -Previous NSTEMI in 6/19, left heart catheterization revealed CTO of the distal RCA and 70 to 80% small D1 not amendable to intervention -Continues to deny chest discomfort -No ischemic changes noted on EKG -Continued on apixaban and lieu of aspirin and statin therapy -EKG as needed for pain or changes  History of DVT/PE -Continued on apixaban -Poor mobility at home where she is wheelchair-bound likely contributing  Anemia -Hemoglobin 9.4 -Remains stable with no overt signs of bleeding -Daily CBC  AKI on CKD stage III -Serum creatinine 1.41 -Continues to improve -Started spironolactone 25 mg daily -  Monitor urine output -Daily BMP -Monitor/trend/replete electrolytes as needed -Avoid nephrotoxic agents were able     For questions or  updates, please contact [facility] Please consult [email or web address] for contact info under        Signed, Aijalon Demuro, NP  05/24/2023, 4:09 PM    

## 2023-05-24 NOTE — Plan of Care (Signed)
  Problem: Cardiovascular: Goal: Ability to achieve and maintain adequate cardiovascular perfusion will improve Outcome: Progressing   Problem: Health Behavior/Discharge Planning: Goal: Ability to safely manage health-related needs after discharge will improve Outcome: Progressing   Problem: Education: Goal: Ability to describe self-care measures that may prevent or decrease complications (Diabetes Survival Skills Education) will improve Outcome: Progressing   Problem: Coping: Goal: Ability to adjust to condition or change in health will improve Outcome: Progressing   

## 2023-05-24 NOTE — Inpatient Diabetes Management (Signed)
Inpatient Diabetes Program Recommendations  AACE/ADA: New Consensus Statement on Inpatient Glycemic Control   Target Ranges:  Prepandial:   less than 140 mg/dL      Peak postprandial:   less than 180 mg/dL (1-2 hours)      Critically ill patients:  140 - 180 mg/dL    Latest Reference Range & Units 05/24/23 03:17 05/24/23 07:37  Glucose-Capillary 70 - 99 mg/dL 161 (H) 096 (H)    Latest Reference Range & Units 05/23/23 07:54 05/23/23 11:16 05/23/23 16:24 05/23/23 19:14 05/23/23 23:24  Glucose-Capillary 70 - 99 mg/dL 045 (H) 409 (H) 811 (H) 284 (H) 309 (H)   Review of Glycemic Control  Diabetes history: DM2 Outpatient Diabetes medications: Basaglar 40 units at bedtime, Humalog 6 units TID, Jardiance 10 mg daily, Ozempic 0.25 mg Qweek Current orders for Inpatient glycemic control: Semglee 30 units BID, Novolog 0-20 units Q4H; Solumedrol 40 mg Q12H   Inpatient Diabetes Program Recommendations:     Insulin: If Solumedrol continued as ordered, please consider increasing Semglee to 33 units BID.  If patient is eating well, consider ordering Novolog 3 units TID with meals for meal coverage if patient eats at least 50% of meals.   Thanks, Orlando Penner, RN, MSN, CDCES Diabetes Coordinator Inpatient Diabetes Program [phone] (Team Pager from 8am to 5pm)

## 2023-05-24 NOTE — Progress Notes (Signed)
Daily Progress Note   Patient Name: Sonya Sanchez       Date: 05/24/2023 DOB: 03/20/1956  Age: 67 y.o. MRN#: [id number] Attending Physician: Gillis Santa, MD Primary Care Physician: Pcp, No Admit Date: 05/13/2023  Reason for Consultation/Follow-up: Establishing goals of care  Subjective: Notes and labs reviewed.  In to see patient.  She is prepared for her procedure tomorrow.   We discussed het diagnosis, prognosis, GOC, EOL wishes disposition and options.  Created space and opportunity for patient  to explore thoughts and feelings regarding current medical information.   A  discussion was had today regarding advanced directives.  Values and goals of care important to patient and family were attempted to be elicited.  Discussed limitations of medical interventions to prolong quality of life in some situations and discussed the concept of human mortality.  Patient states she would want any and all care at this time.  She states she has a lot left to do with her life.  She states she would want CPR.  She advises that her sister would know what to do if ever she cannot make decisions for herself and the care became something that she would not be okay with.    Length of Stay: 10  Current Medications: Scheduled Meds:   apixaban  5 mg Oral BID   atorvastatin  80 mg Oral Daily   Chlorhexidine Gluconate Cloth  6 each Topical Daily   doxycycline  100 mg Oral Q12H   feeding supplement  237 mL Oral TID BM   fluticasone furoate-vilanterol  1 puff Inhalation Daily   insulin aspart  0-20 Units Subcutaneous Q4H   insulin glargine-yfgn  30 Units Subcutaneous BID   ipratropium-albuterol  3 mL Nebulization BID   midodrine  5 mg Oral TID WC   multivitamin with minerals  1 tablet Oral Daily   mouth  rinse  15 mL Mouth Rinse 4 times per day   pantoprazole  40 mg Oral Daily   polyethylene glycol  17 g Oral Daily   predniSONE  40 mg Oral Q breakfast   Followed by   [name] ON 05/27/2023] predniSONE  30 mg Oral Q breakfast   Followed by   [name] ON 05/30/2023] predniSONE  20 mg Oral Q breakfast   Followed by   [name] ON 06/02/2023] predniSONE  10 mg Oral Q breakfast   senna  1 tablet Oral BID   sodium chloride flush  3 mL Intravenous Q12H   spironolactone  25 mg Oral Daily   torsemide  20 mg Oral Daily    Continuous Infusions:  amiodarone 30 mg/hr (05/24/23 1500)   piperacillin-tazobactam (ZOSYN)  IV 3.375 g (05/24/23 1358)    PRN Meds: acetaminophen **OR** acetaminophen, mouth rinse, polyethylene glycol, senna-docusate  Physical Exam Pulmonary:     Effort: Pulmonary effort is normal.  Neurological:     Mental Status: She is alert.             Vital Signs: BP (!) 141/48 (BP Location: Left Wrist)   Pulse 92   Temp 98.5 F (36.9 C) (Oral)   Resp 20   Ht 5' 2.01" (1.575 m)   Wt 113.7 kg   SpO2 95%   BMI 45.84 kg/m  SpO2: SpO2: 95 % O2 Device: O2 Device: Nasal Cannula O2 Flow Rate: O2 Flow Rate (L/min): 2 L/min  Intake/output summary:  Intake/Output Summary (Last 24 hours) at 05/24/2023 1549 Last data filed at 05/24/2023 1500 Gross per 24 hour  Intake 1562.54 ml  Output 2400 ml  Net -837.46 ml   LBM: Last BM Date : 05/24/23 Baseline Weight: Weight: 132.7 kg Most recent weight: Weight: 113.7 kg   Patient Active Problem List   Diagnosis Date Noted   Atrial fibrillation with RVR (HCC) 05/22/2023   Mitral valve stenosis 05/22/2023   Chronic diastolic CHF (congestive heart failure) (HCC) 05/20/2023   Pneumonia due to infectious organism 05/19/2023   Hyperkalemia 05/14/2023   AKI (acute kidney injury) (HCC) 05/14/2023   Hyponatremia 05/14/2023   Anemia 05/14/2023   Coronary artery disease due to lipid rich plaque 05/05/2023   Acute on chronic heart failure with  preserved ejection fraction (HCC) 05/02/2023   Acute respiratory failure with hypoxia and hypercapnia (HCC) 04/30/2023   Acute metabolic encephalopathy 04/30/2023   Acute on chronic respiratory failure with hypoxemia (HCC) 04/29/2023   Bedbound 04/29/2023   Adult onset dermatomyositis (HCC) 04/29/2023   Altered mental status 04/29/2023   Irritable bowel syndrome with constipation 05/11/2019   Post-nasal drip 02/21/2019   Allergic rhinitis 02/21/2019   Cataracts, bilateral 01/27/2018   Uncontrolled type 2 diabetes mellitus with hyperglycemia (HCC) 01/19/2018   Bronchitis 12/22/2016   OSA (obstructive sleep apnea) 12/08/2016   Elevated troponin 08/11/2015   Non-ST elevation (NSTEMI) myocardial infarction [facility])    Poorly controlled type 2 diabetes mellitus with circulatory disorder (HCC)    Aortic valve stenosis 01/30/2014   Chronic venous insufficiency 08/03/2013   Pulmonary embolism (HCC) 03/21/2013   DVT (deep venous thrombosis) (HCC) 03/21/2013   Hyperlipidemia 02/13/2013   Morbid obesity (HCC) 02/13/2013   Essential hypertension 12/25/2012    Palliative Care Assessment & Plan     Recommendations/Plan: Full code/full scope  Code Status:    Code Status Orders  (From admission, onward)           Start     Ordered   05/14/23 0041  Full code  Continuous       Question:  By:  Answer:  Other   05/14/23 0041           Code Status History     Date Active Date Inactive Code Status Order ID Comments User Context   04/29/2023 1754 05/05/2023 2219 Full Code [id number]  Lovenia Kim, DO ED   08/08/2015 0149 08/11/2015 2056 Full Code [id number]  Hower, Onalee Hua  K, MD ED       Prognosis:  Unable to determine   Thank you for allowing the Palliative Medicine Team to assist in the care of this patient.    Morton Stall, NP  Please contact Palliative Medicine Team phone at [phone] for questions and concerns.

## 2023-05-25 ENCOUNTER — Inpatient Hospital Stay: Payer: Medicare Other | Admitting: Certified Registered Nurse Anesthetist

## 2023-05-25 ENCOUNTER — Encounter: Payer: Self-pay | Admitting: Cardiology

## 2023-05-25 ENCOUNTER — Inpatient Hospital Stay (HOSPITAL_COMMUNITY)
Admit: 2023-05-25 | Discharge: 2023-05-25 | Disposition: A | Discharge status: Home / Self Care | Payer: Medicare Other | Attending: Medical | Admitting: Medical

## 2023-05-25 ENCOUNTER — Other Ambulatory Visit: Payer: Self-pay

## 2023-05-25 ENCOUNTER — Encounter
Admission: EM | Disposition: A | Discharge status: Skilled Nursing Facility | Payer: Self-pay | Source: Skilled Nursing Facility | Attending: Pulmonary Disease

## 2023-05-25 DIAGNOSIS — I340 Nonrheumatic mitral (valve) insufficiency: Secondary | ICD-10-CM | POA: Diagnosis not present

## 2023-05-25 DIAGNOSIS — I350 Nonrheumatic aortic (valve) stenosis: Secondary | ICD-10-CM

## 2023-05-25 DIAGNOSIS — I4819 Other persistent atrial fibrillation: Secondary | ICD-10-CM | POA: Diagnosis not present

## 2023-05-25 DIAGNOSIS — N179 Acute kidney failure, unspecified: Secondary | ICD-10-CM | POA: Diagnosis not present

## 2023-05-25 DIAGNOSIS — Z7189 Other specified counseling: Secondary | ICD-10-CM | POA: Diagnosis not present

## 2023-05-25 DIAGNOSIS — I4891 Unspecified atrial fibrillation: Secondary | ICD-10-CM

## 2023-05-25 HISTORY — PX: CARDIOVERSION: SHX1299

## 2023-05-25 HISTORY — PX: TEE WITHOUT CARDIOVERSION: SHX5443

## 2023-05-25 LAB — CBC
HCT: 29.4 % — ABNORMAL LOW (ref 36.0–46.0)
Hemoglobin: 9.4 g/dL — ABNORMAL LOW (ref 12.0–15.0)
MCH: 29.5 pg (ref 26.0–34.0)
MCHC: 32 g/dL (ref 30.0–36.0)
MCV: 92.2 fL (ref 80.0–100.0)
Platelets: 173 10*3/uL (ref 150–400)
RBC: 3.19 MIL/uL — ABNORMAL LOW (ref 3.87–5.11)
RDW: 19.6 % — ABNORMAL HIGH (ref 11.5–15.5)
WBC: 8.6 10*3/uL (ref 4.0–10.5)
nRBC: 0 % (ref 0.0–0.2)

## 2023-05-25 LAB — ECHO TEE
AR max vel: 0.72 cm2
AV Area VTI: 0.63 cm2
AV Area mean vel: 0.69 cm2
AV Mean grad: 26 mmHg
AV Peak grad: 37.9 mmHg
Ao pk vel: 3.08 m/s

## 2023-05-25 LAB — BASIC METABOLIC PANEL
Anion gap: 11 (ref 5–15)
BUN: 74 mg/dL — ABNORMAL HIGH (ref 8–23)
CO2: 30 mmol/L (ref 22–32)
Calcium: 8.7 mg/dL — ABNORMAL LOW (ref 8.9–10.3)
Chloride: 101 mmol/L (ref 98–111)
Creatinine, Ser: 1.36 mg/dL — ABNORMAL HIGH (ref 0.44–1.00)
GFR, Estimated: 42 mL/min — ABNORMAL LOW (ref >60)
Glucose, Bld: 152 mg/dL — ABNORMAL HIGH (ref 70–99)
Potassium: 3.6 mmol/L (ref 3.5–5.1)
Sodium: 142 mmol/L (ref 135–145)

## 2023-05-25 LAB — GLUCOSE, CAPILLARY
Glucose-Capillary: 149 mg/dL — ABNORMAL HIGH (ref 70–99)
Glucose-Capillary: 103 mg/dL — ABNORMAL HIGH (ref 70–99)
Glucose-Capillary: 98 mg/dL (ref 70–99)
Glucose-Capillary: 131 mg/dL — ABNORMAL HIGH (ref 70–99)
Glucose-Capillary: 257 mg/dL — ABNORMAL HIGH (ref 70–99)
Glucose-Capillary: 271 mg/dL — ABNORMAL HIGH (ref 70–99)

## 2023-05-25 LAB — PHOSPHORUS: Phosphorus: 2.6 mg/dL (ref 2.5–4.6)

## 2023-05-25 LAB — MAGNESIUM: Magnesium: 2 mg/dL (ref 1.7–2.4)

## 2023-05-25 SURGERY — ECHOCARDIOGRAM, TRANSESOPHAGEAL
Anesthesia: General

## 2023-05-25 MED ORDER — BUTAMBEN-TETRACAINE-BENZOCAINE 2-2-14 % EX AERO
INHALATION_SPRAY | CUTANEOUS | Status: AC
  Filled 2023-05-25: qty 5

## 2023-05-25 MED ORDER — PHENYLEPHRINE 80 MCG/ML (10ML) SYRINGE FOR IV PUSH (FOR BLOOD PRESSURE SUPPORT)
PREFILLED_SYRINGE | INTRAVENOUS | Status: DC | PRN
  Administered 2023-05-25: 80 ug via INTRAVENOUS

## 2023-05-25 MED ORDER — LIDOCAINE VISCOUS HCL 2 % MT SOLN
OROMUCOSAL | Status: AC
  Filled 2023-05-25: qty 15

## 2023-05-25 MED ORDER — INSULIN GLARGINE-YFGN 100 UNIT/ML ~~LOC~~ SOLN
40.0000 [IU] | Freq: Every day | SUBCUTANEOUS | Status: DC
  Administered 2023-05-25 – 2023-05-26 (×2): 40 [IU] via SUBCUTANEOUS
  Filled 2023-05-25 (×4): qty 0.4

## 2023-05-25 MED ORDER — POTASSIUM CHLORIDE CRYS ER 20 MEQ PO TBCR
40.0000 meq | EXTENDED_RELEASE_TABLET | Freq: Once | ORAL | Status: AC
  Administered 2023-05-25: 40 meq via ORAL
  Filled 2023-05-25: qty 2

## 2023-05-25 MED ORDER — PROPOFOL 10 MG/ML IV BOLUS
INTRAVENOUS | Status: DC | PRN
  Administered 2023-05-25: 40 mg via INTRAVENOUS
  Administered 2023-05-25 (×2): 10 mg via INTRAVENOUS
  Administered 2023-05-25 (×2): 20 mg via INTRAVENOUS
  Administered 2023-05-25 (×2): 10 mg via INTRAVENOUS
  Administered 2023-05-25: 20 mg via INTRAVENOUS

## 2023-05-25 NOTE — Anesthesia Postprocedure Evaluation (Signed)
Anesthesia Post Note  Patient: Sonya Sanchez  Procedure(s) Performed: TRANSESOPHAGEAL ECHOCARDIOGRAM CARDIOVERSION  Patient location during evaluation: Specials Recovery Anesthesia Type: General Level of consciousness: awake and alert Pain management: pain level controlled Vital Signs Assessment: post-procedure vital signs reviewed and stable Respiratory status: spontaneous breathing, nonlabored ventilation, respiratory function stable and patient connected to nasal cannula oxygen Cardiovascular status: blood pressure returned to baseline and stable Postop Assessment: no apparent nausea or vomiting Anesthetic complications: no   No notable events documented.   Last Vitals:  Vitals:   05/25/23 0848 05/25/23 0901  BP: (!) 150/48 (!) 145/51  Pulse: 65 (!) 55  Resp: (!) 21 17  Temp:    SpO2: 98% 97%    Last Pain:  Vitals:   05/25/23 0848  TempSrc:   PainSc: 0-No pain                 Cleda Mccreedy Lekisha Mcghee

## 2023-05-25 NOTE — Progress Notes (Signed)
Advanced Heart Failure Rounding Note  PCP-Cardiologist: Julien Nordmann, MD   Subjective:    Patient underwent TEE-guided DCCV this morning and is now back in NSR.   TEE: EF 55-60% with moderate LVH, normal RV, suspected low flow/low gradient severe AS with mean gradient 28 mmHg and AVA 0.5 cm^2.    Objective:   Weight Range: 113.6 kg Body mass index is 45.79 kg/m.   Vital Signs:   Temp:  [97.7 F (36.5 C)-99.9 F (37.7 C)] 98.5 F (36.9 C) (06/26 0809) Pulse Rate:  [60-106] 63 (06/26 0844) Resp:  [12-34] 22 (06/26 0844) BP: (128-180)/(5-137) 151/54 (06/26 0842) SpO2:  [89 %-100 %] 98 % (06/26 0844) Weight:  [113.6 kg] 113.6 kg (06/26 0355) Last BM Date : 05/24/23  Weight change: Filed Weights   05/23/23 0500 05/24/23 0500 05/25/23 0355  Weight: 121.3 kg 113.7 kg 113.6 kg    Intake/Output:   Intake/Output Summary (Last 24 hours) at 05/25/2023 0846 Last data filed at 05/25/2023 0700 Gross per 24 hour  Intake 1688.66 ml  Output 1400 ml  Net 288.66 ml      Physical Exam    General:  Well appearing. No resp difficulty HEENT: Normal Neck: Supple. JVP 8 cm. Carotids 2+ bilat; no bruits. No lymphadenopathy or thyromegaly appreciated. Cor: PMI nondisplaced. Regular rate & rhythm. No rubs, gallops or murmurs. Lungs: Clear Abdomen: Soft, nontender, nondistended. No hepatosplenomegaly. No bruits or masses. Good bowel sounds. Extremities: No cyanosis, clubbing, rash, edema Neuro: Alert & orientedx3, cranial nerves grossly intact. moves all 4 extremities w/o difficulty. Affect pleasant   Telemetry   AF 80s => NSR 60s (personally reviewed)   Labs    CBC Recent Labs    05/24/23 0533 05/25/23 0430  WBC 7.7 8.6  HGB 9.4* 9.4*  HCT 29.1* 29.4*  MCV 90.1 92.2  PLT 184 173   Basic Metabolic Panel Recent Labs    02/72/53 0533 05/25/23 0430  NA 140 142  K 4.0 3.6  CL 101 101  CO2 27 30  GLUCOSE 188* 152*  BUN 80* 74*  CREATININE 1.41* 1.36*   CALCIUM 8.7* 8.7*  MG 1.8 2.0  PHOS 2.9 2.6   Liver Function Tests Recent Labs    05/23/23 0442  ALBUMIN 2.4*   No results for input(s): "LIPASE", "AMYLASE" in the last 72 hours. Cardiac Enzymes No results for input(s): "CKTOTAL", "CKMB", "CKMBINDEX", "TROPONINI" in the last 72 hours.  BNP: BNP (last 3 results) Recent Labs    04/29/23 1507  BNP 386.8*    ProBNP (last 3 results) No results for input(s): "PROBNP" in the last 8760 hours.   D-Dimer No results for input(s): "DDIMER" in the last 72 hours. Hemoglobin A1C No results for input(s): "HGBA1C" in the last 72 hours. Fasting Lipid Panel No results for input(s): "CHOL", "HDL", "LDLCALC", "TRIG", "CHOLHDL", "LDLDIRECT" in the last 72 hours. Thyroid Function Tests Recent Labs    05/24/23 0533  TSH 3.918    Other results:   Imaging    No results found.   Medications:     Scheduled Medications:  apixaban  5 mg Oral BID   atorvastatin  80 mg Oral Daily   butamben-tetracaine-benzocaine       Chlorhexidine Gluconate Cloth  6 each Topical Daily   doxycycline  100 mg Oral Q12H   feeding supplement  237 mL Oral TID BM   fluticasone furoate-vilanterol  1 puff Inhalation Daily   insulin aspart  0-20 Units Subcutaneous Q4H  insulin glargine-yfgn  30 Units Subcutaneous BID   ipratropium-albuterol  3 mL Nebulization BID   lidocaine       melatonin  5 mg Oral QHS   multivitamin with minerals  1 tablet Oral Daily   mouth rinse  15 mL Mouth Rinse 4 times per day   pantoprazole  40 mg Oral Daily   polyethylene glycol  17 g Oral Daily   potassium chloride  40 mEq Oral Once   predniSONE  40 mg Oral Q breakfast   Followed by   [name] ON 05/27/2023] predniSONE  30 mg Oral Q breakfast   Followed by   [name] ON 05/30/2023] predniSONE  20 mg Oral Q breakfast   Followed by   [name] ON 06/02/2023] predniSONE  10 mg Oral Q breakfast   senna  1 tablet Oral BID   sodium chloride flush  3 mL Intravenous Q12H    spironolactone  25 mg Oral Daily   torsemide  20 mg Oral Daily    Infusions:  sodium chloride 200 mL/hr at 05/25/23 0842   amiodarone 30 mg/hr (05/25/23 0817)   piperacillin-tazobactam (ZOSYN)  IV 12.5 mL/hr at 05/25/23 0700    PRN Medications: acetaminophen **OR** acetaminophen, butamben-tetracaine-benzocaine, lidocaine, mouth rinse, polyethylene glycol, senna-docusate   Assessment/Plan   1. Acute on chronic diastolic CHF: Echo (6/24) with EF 60-655, mild LVH, normal RV, mild mitral stenosis mean gradient 7, moderate aortic stenosis with AVA 1.08 cm^2 mean gradient 10, IVC dilated.  TEE today showed EF 55-60% with moderate LVH, normal RV, suspected low flow/low gradient severe AS with mean gradient 28 mmHg and AVA 0.5 cm^2.   Atrial fibrillation contributes to CHF, now s/p DCCV.  Weight has trended down and she feels overall better. She is now on po torsemide.   - Continue torsemide 20 mg daily.  - Hold off on Jardiance for now with recent E coli UTI.  - Continue spironolactone 25 mg daily.  2.  Septic shock: Hospital-acquired PNA versus E coli urosepsis.  She has been titrated off phenylephrine and midodrine. PCT was elevated.  BP now elevated.  - She remains on Zosyn with E coli in urine resistant to cefepime.  3. Acute on chronic hypercarbic hypoxemic respiratory failure: Multifactorial with OHS/OSA, suspected PNA, CHF.  Baseline on home oxygen + CPAP.  - She is on prednisone, will need to titrate off steroids per primary team.  - Continue oxygen 2L [place] + Bipap, suspect now at baseline oxygen requirement.  - Gentle diuresis with torsemide as above.  4. Atrial fibrillation: New onset, first noted on 6/19.  She was in rate-controlled AF at admission, now on amiodarone gtt and apixaban.  AF likely worsens her CHF.  Now s/p TEE-guided DCCV.  - Restart Toprol XL 25 mg daily.  - Can continue amiodarone gtt overnight then transition to po amiodarone.  - Continue apixaban.  5. 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  - Chronic apixaban.  6. Aortic stenosis: TEE today suggested paradoxical low flow/low gradient severe AS with mean gradient 28 mmHg and AVA 0.5 cm^2.  Will need workup for possible TAVR (as outpatient), but given very limited mobility, I am not sure she would be a good candidate.  Will need to see how she does after this hospitalization.  7. CAD: NSTEMI in 6/19, cath showed CTO distal RCA and 70-80% small D1 not amenable to intervention. No chest pain.  - Continue atorvastatin.  - No ASA given apixaban use.  8. H/o  dermatomyositis: Azathioprine on hold.  9. Anemia: No overt bleeding.  Transferrin saturation and ferritin actually high.  10. AKI on CKD stage 3: Creatinine trending down.  Follow closely with restart of diuretic.   Length of Stay: 62  Marca Ancona, MD  05/25/2023, 8:46 AM  Advanced Heart Failure Team Pager [phone] (M-F; 7a - 5p)  Please contact [facility] for night-coverage after hours (5p -7a ) and weekends on [email or web address]

## 2023-05-25 NOTE — Procedures (Signed)
Electrical Cardioversion Procedure Note Sonya Sanchez [id number] 10/14/1956  Procedure: Electrical Cardioversion Indications:  Atrial Fibrillation  Procedure Details Consent: Risks of procedure as well as the alternatives and risks of each were explained to the (patient/caregiver).  Consent for procedure obtained. Time Out: Verified patient identification, verified procedure, site/side was marked, verified correct patient position, special equipment/implants available, medications/allergies/relevent history reviewed, required imaging and test results available.  Performed  Patient placed on cardiac monitor, pulse oximetry, supplemental oxygen as necessary.  Sedation given:  Propofol  Pacer pads placed anterior and posterior chest.  Cardioverted 1 time(s).  Cardioverted at 200J.  Evaluation Findings: Post procedure EKG shows: NSR Complications: None Patient did tolerate procedure well.   Sonya Sanchez 05/25/2023, 8:44 AM

## 2023-05-25 NOTE — Anesthesia Preprocedure Evaluation (Addendum)
Anesthesia Evaluation  Patient identified by MRN, date of birth, ID band Patient awake    Reviewed: Allergy & Precautions, NPO status , Patient's Chart, lab work & pertinent test results  History of Anesthesia Complications Negative for: history of anesthetic complications  Airway Mallampati: III  TM Distance: <3 FB Neck ROM: full    Dental  (+) Chipped, Poor Dentition, Missing   Pulmonary shortness of breath, sleep apnea , pneumonia, unresolved, former smoker   + rhonchi  + decreased breath sounds+ wheezing      Cardiovascular hypertension, (-) angina + CAD, + Past MI and +CHF  Normal cardiovascular exam+ Valvular Problems/Murmurs AS  Rhythm:irregular Rate:Normal     Neuro/Psych  Neuromuscular disease  negative psych ROS   GI/Hepatic negative GI ROS, Neg liver ROS,neg GERD  ,,  Endo/Other  diabetes, Type 2    Renal/GU Renal disease  negative genitourinary   Musculoskeletal   Abdominal   Peds  Hematology negative hematology ROS (+)   Anesthesia Other Findings Patient has some pre existing bruising / abrasion on her nose  Past Medical History: No date: Calculus of kidney No date: Diabetes mellitus without complication (HCC) No date: DVT (deep venous thrombosis) (HCC)     Comment:  a. on Eliquis No date: Family history of early CAD     Comment:  a. parents passing in their 60's from CAD No date: Gout, unspecified No date: Heart murmur No date: Hyperlipidemia No date: Hypertension No date: Morbid obesity (HCC) No date: Normal cardiac stress test     Comment:  a. equivocal study, sig soft tissue artifact present, no              chest discomfort or ECG changes, perfusion images suggest              mod sized region of mild reversible perfusion defect.               Findings may be 2/2 shifting soft tissue attenuation, but              cannot r/o ischemia, EF 72% No date: Phlebitis and thrombophlebitis of  other deep vessels of  lower extremities No date: Phlebitis and thrombophlebitis of other deep vessels of  lower extremities No date: Pulmonary emboli (HCC)     Comment:  a. on Eliquis No date: Spinal stenosis, unspecified region other than cervical No date: Tobacco abuse No date: Type II or unspecified type diabetes mellitus without  mention of complication, uncontrolled No date: Unspecified sleep apnea No date: Urinary tract infection, site not specified  Past Surgical History: No date: ABDOMINAL HYSTERECTOMY     Comment:  hyperplasia of endometrium No date: ANKLE SURGERY     Comment:  fracture s/p pin, right ankle No date: APPENDECTOMY No date: CHOLECYSTECTOMY No date: COLECTOMY     Comment:  temporary colostomy, now reversed No date: INTESTINAL BYPASS     Comment:  ovarian cyst ruptured, led to perforated intestine 05/04/2023: LEFT HEART CATH AND CORONARY ANGIOGRAPHY; N/A     Comment:  Procedure: LEFT HEART CATH AND CORONARY ANGIOGRAPHY;                Surgeon: Yvonne Kendall, MD;  Location: [facility] INVASIVE               CV LAB;  Service: Cardiovascular;  Laterality: N/A; No date: STOMACH SURGERY  BMI    Body Mass Index: 45.79 kg/m      Reproductive/Obstetrics negative OB ROS  Anesthesia Physical Anesthesia Plan  ASA: 4  Anesthesia Plan: General   Post-op Pain Management:    Induction: Intravenous  PONV Risk Score and Plan: Propofol infusion and TIVA  Airway Management Planned: Natural Airway and Nasal CPAP  Additional Equipment:   Intra-op Plan:   Post-operative Plan:   Informed Consent: I have reviewed the patients History and Physical, chart, labs and discussed the procedure including the risks, benefits and alternatives for the proposed anesthesia with the patient or authorized representative who has indicated his/her understanding and acceptance.     Dental Advisory Given  Plan Discussed with:  Anesthesiologist, CRNA and Surgeon  Anesthesia Plan Comments: (Patient consented for risks of anesthesia including but not limited to:  - adverse reactions to medications - risk of airway placement if required - damage to eyes, teeth, lips or other oral mucosa - nerve damage due to positioning  - sore throat or hoarseness - Damage to heart, brain, nerves, lungs, other parts of body or loss of life  Patient voiced understanding.)       Anesthesia Quick Evaluation

## 2023-05-25 NOTE — Interval H&P Note (Signed)
History and Physical Interval Note:  05/25/2023 8:15 AM  Sonya Sanchez  has presented today for surgery, with the diagnosis of atrial fibrillation.  The various methods of treatment have been discussed with the patient and family. After consideration of risks, benefits and other options for treatment, the patient has consented to  Procedure(s): TRANSESOPHAGEAL ECHOCARDIOGRAM (N/A) CARDIOVERSION (N/A) as a surgical intervention.  The patient's history has been reviewed, patient examined, no change in status, stable for surgery.  I have reviewed the patient's chart and labs.  Questions were answered to the patient's satisfaction.     Bomani Oommen [employer]

## 2023-05-25 NOTE — Progress Notes (Signed)
*  PRELIMINARY RESULTS* Echocardiogram Echocardiogram Transesophageal has been performed.  Cristela Blue 05/25/2023, 8:52 AM

## 2023-05-25 NOTE — Progress Notes (Signed)
Daily Progress Note   Patient Name: Sonya Sanchez       Date: 05/25/2023 DOB: [birth year]  Age: 67 y.o. MRN#: [id number] Attending Physician: Gillis Santa, MD Primary Care Physician: Pcp, No Admit Date: 05/13/2023  Reason for Consultation/Follow-up: Establishing goals of care  Subjective: Patient completed TEE and cardioversion today. She states she is feeling well today. She is hoepful to continue progress and be able to leave ICU/step down.   Full code/ full scope.    Length of Stay: 11  Current Medications: Scheduled Meds:   apixaban  5 mg Oral BID   atorvastatin  80 mg Oral Daily   Chlorhexidine Gluconate Cloth  6 each Topical Daily   doxycycline  100 mg Oral Q12H   feeding supplement  237 mL Oral TID BM   fluticasone furoate-vilanterol  1 puff Inhalation Daily   insulin aspart  0-20 Units Subcutaneous Q4H   insulin glargine-yfgn  40 Units Subcutaneous QHS   ipratropium-albuterol  3 mL Nebulization BID   melatonin  5 mg Oral QHS   multivitamin with minerals  1 tablet Oral Daily   mouth rinse  15 mL Mouth Rinse 4 times per day   pantoprazole  40 mg Oral Daily   polyethylene glycol  17 g Oral Daily   predniSONE  40 mg Oral Q breakfast   Followed by   [name] ON 05/27/2023] predniSONE  30 mg Oral Q breakfast   Followed by   [name] ON 05/30/2023] predniSONE  20 mg Oral Q breakfast   Followed by   [name] ON 06/02/2023] predniSONE  10 mg Oral Q breakfast   senna  1 tablet Oral BID   sodium chloride flush  3 mL Intravenous Q12H   spironolactone  25 mg Oral Daily   torsemide  20 mg Oral Daily    Continuous Infusions:  sodium chloride 20 mL/hr at 05/25/23 1300   amiodarone Stopped (05/25/23 0926)   piperacillin-tazobactam (ZOSYN)  IV 3.375 g (05/25/23 1354)    PRN  Meds: acetaminophen **OR** acetaminophen, mouth rinse, polyethylene glycol, senna-docusate  Physical Exam Pulmonary:     Effort: Pulmonary effort is normal.  Neurological:     Mental Status: She is alert.             Vital Signs: BP (!) 145/59   Pulse 71  Temp 98.5 F (36.9 C) (Oral)   Resp 18   Ht 5' 2.01" (1.575 m)   Wt 113.6 kg   SpO2 92%   BMI 45.79 kg/m  SpO2: SpO2: 92 % O2 Device: O2 Device: Nasal Cannula O2 Flow Rate: O2 Flow Rate (L/min): 2 L/min  Intake/output summary:  Intake/Output Summary (Last 24 hours) at 05/25/2023 1556 Last data filed at 05/25/2023 1300 Gross per 24 hour  Intake 531.45 ml  Output 1050 ml  Net -518.55 ml   LBM: Last BM Date : 05/24/23 Baseline Weight: Weight: 132.7 kg Most recent weight: Weight: 113.6 kg    Patient Active Problem List   Diagnosis Date Noted   Atrial fibrillation with RVR (HCC) 05/22/2023   Mitral valve stenosis 05/22/2023   Chronic diastolic CHF (congestive heart failure) (HCC) 05/20/2023   Pneumonia due to infectious organism 05/19/2023   Hyperkalemia 05/14/2023   AKI (acute kidney injury) (HCC) 05/14/2023   Hyponatremia 05/14/2023   Anemia 05/14/2023   Coronary artery disease due to lipid rich plaque 05/05/2023   Acute on chronic heart failure with preserved ejection fraction (HFpEF) (HCC) 05/02/2023   Acute respiratory failure with hypoxia and hypercapnia (HCC) 04/30/2023   Acute metabolic encephalopathy 04/30/2023   Acute on chronic respiratory failure with hypoxemia (HCC) 04/29/2023   Bedbound 04/29/2023   Adult onset dermatomyositis (HCC) 04/29/2023   Altered mental status 04/29/2023   Irritable bowel syndrome with constipation 05/11/2019   Post-nasal drip 02/21/2019   Allergic rhinitis 02/21/2019   Cataracts, bilateral 01/27/2018   Uncontrolled type 2 diabetes mellitus with hyperglycemia (HCC) 01/19/2018   Bronchitis 12/22/2016   OSA (obstructive sleep apnea) 12/08/2016   Elevated troponin  08/11/2015   Non-ST elevation (NSTEMI) myocardial infarction [facility])    Poorly controlled type 2 diabetes mellitus with circulatory disorder (HCC)    Aortic valve stenosis 01/30/2014   Chronic venous insufficiency 08/03/2013   Pulmonary embolism (HCC) 03/21/2013   DVT (deep venous thrombosis) (HCC) 03/21/2013   Hyperlipidemia 02/13/2013   Morbid obesity (HCC) 02/13/2013   Essential hypertension 12/25/2012    Palliative Care Assessment & Plan    Recommendations/Plan: Full code/ full scope.   Code Status:    Code Status Orders  (From admission, onward)           Start     Ordered   05/14/23 0041  Full code  Continuous       Question:  By:  Answer:  Other   05/14/23 0041           Code Status History     Date Active Date Inactive Code Status Order ID Comments User Context   04/29/2023 1754 05/05/2023 2219 Full Code [id number]  Lovenia Kim, DO ED   08/08/2015 0149 08/11/2015 2056 Full Code [id number]  Hower, Cletis [place], MD ED       Thank you for allowing the Palliative Medicine Team to assist in the care of this patient.     Morton Stall, NP  Please contact Palliative Medicine Team phone at [phone] for questions and concerns.

## 2023-05-25 NOTE — CV Procedure (Signed)
Procedure: TEE  Sedation: Per anesthesiology  Indication: atrial fibrillation  Findings: Please see echo section for full report.  Normal LV size with moderate LV hypertrophy.  EF 55-60%, no wall motion abnormalities.  Normal RV size and systolic function. Moderate left atrial enlargement, no LA appendage thrombus.  Mild right atrial enlargement.  No PFO/ASD by color doppler.  Trivial TR.  Mild-moderate MR with calcified mitral valve.  Severe aortic valve calcification with suspected paradoxical low flow/low gradient severe AS (mean gradient 28 mmHg, AVA 0.5 cm^2).  No significant AI.  Normal caliber thoracic aorta with grade 3 plaque descending thoracic aorta.   Proceed with DCCV.   Marca Ancona 05/25/2023 8:44 AM

## 2023-05-25 NOTE — Progress Notes (Signed)
[facility] Progress Note  Patient: Sonya Sanchez    MWN:[id number]  DOA: 05/13/2023     Date of Service: the patient was seen and examined on 05/25/2023  Chief Complaint  Patient presents with   Abnormal Lab    Pt was sent here from [facility] d/t Potassium results of 6.4 and BUN 134 today. Pt denies any pain   Brief hospital course: 67 yo F with obesity, from nursing home. She has as history of PE, DVT, aortic stenosis, HTN, chronic bronchitis, OSA, AKI, dermatomyositis, DM2, dyslipidemia, came in due to worsening renal function noted on bloodwork. She was noted to be lethargic, denied pain anywhere. She was found to have severe hypercapnic acidemia. She was placed on BIPAP. PCCM was consulted to due AMS lethargy on BIPAP.  Patient got intubated and extubated on 05/18/2023.  Significant hospital events and procedures 05/15/23 central line placed, nephrology, cardiology consult 05/16/2023-Lasix drip increased, metolazone added 6/18-started antibiotics due to increasing WBC count, pancultures sent 6/19 extubated.  Had respiratory distress and was put on BiPAP.  Started amnio for rapid A-fib. 6/20: lasix drip stopped. 6/21: rested off BPAP. Phenyl drip.  6/22: started on midodrine.   Assessment and Plan:  # Acute on chronic diastolic heart failure Echo (6/24) with EF 60-655, mild LVH, normal RV, mild mitral stenosis mean gradient 7, moderate aortic stenosis with AVA 1.08 cm^2 mean gradient 10, IVC dilated.  Patient was on Lasix 40 mg and Jardiance at home Appreciate cards eval and recs Monitor CVP S/p diuresis which was held on 6/23 due to elevated creatinine. 6/24 started torsemide 20 mg p.o. daily as per cardiology 6/25 started spironolactone 25 mg p.o. daily  # Atrial fibrillation Continue amiodarone drip and anticoagulation with Eliquis Cardiology following, status post electrocardioversion done on 6/26 Continue to monitor on telemetry and follow cardiology  for further management   AKI secondary to IV contrast, ATN Hyperkalemia-resolved Creatinine 1.36, gradually improving Monitor renal functions and urine output daily   Sepsis.  Possible sources are hospital-acquired pneumonia versus e coli urosepsis Right lower lobe infiltrate on chest x-ray MRSA positive- s/p vancomycin DC'd on 6/24 after a.m. dose and started doxycycline 100 mg p.o. twice daily for 7 days, Continue zosyn as E coli in urine is resistant to cefepime.  S/p Neo-Synephrine. Procalcitonin is elevated.   I suspect that some of this is diastolic dysfunction; starting midodrine (6/22) to help with BP support S/p midodrine, which was discontinued, blood pressure improved.   6/25  repeat blood cultures NGTD  Acute on chronic hypoxic, hypercarbic respiratory failure secondary to CHF History of OSA, morbid obesity Required BiPAP postextubation. transition from BiPAP to high flow nasal cannula  Continue BiPAP at night and during sleep for history of severe OSA Continue supplemental O2 [place] and gradually wean off   Dermatomyositis Monitor CK Off azathioprine due to concern for sepsis.   H/O DVT, PE Continue Eliquis   Diabetes mellitus SSI coverage Hyperglycemia remains a  challenge Increased Lantus 30u BID  Morbid obesity Body mass index is 45.79 kg/m.  Nutrition Problem: Inadequate oral intake Etiology: inability to eat (pt sedated and ventilated) Interventions: Interventions: Ensure Enlive (each supplement provides 350kcal and 20 grams of protein), MVI, Magic cup   Diet: Dysphagia 2 diet DVT Prophylaxis: Therapeutic Anticoagulation with Eliquis    Advance goals of care discussion: Full code  Family Communication: family was not present at bedside, at the time of interview.  The pt provided permission to discuss medical plan  with the family. Opportunity was given to ask question and all questions were answered satisfactorily.   Disposition:  Pt is from  Home, admitted with sepsis, diastolic heart failure, volume overload, AKI, still has AKI, volume overload, A-fib with RVR on amiodarone IV infusion, which precludes a safe discharge. Discharge to home with home PT versus SNF TBD after improvement  Subjective: No significant events overnight, patient was seen after elective cardioversion, tolerated procedure well.  Patient feels a lot better as compared to yesterday.  Denies any chest pain or palpitation, no any other active issues.  Physical Exam: General: NAD, lying comfortably Appear in no distress, affect appropriate Eyes: PERRLA ENT: Oral Mucosa Clear, moist  Neck: no JVD,  Cardiovascular: SR, S1-S2 audible, no Murmur,  Respiratory: good respiratory effort, Bilateral Air entry equal and Decreased, bibasilar crackles, and mild wheezes Abdomen: Bowel Sound present, Soft and no tenderness,  Skin: no rashes Extremities: 4+ pedal edema, no calf tenderness Neurologic: without any new focal findings Gait not checked due to patient safety concerns  Vitals:   05/25/23 1300 05/25/23 1400 05/25/23 1500 05/25/23 1621  BP: (!) 138/39 129/68 (!) 145/59 (!) 159/56  Pulse: 80 79 71 72  Resp: 16 (!) 27 18 16   Temp:      TempSrc:      SpO2: 94% 95% 92% 98%  Weight:      Height:        Intake/Output Summary (Last 24 hours) at 05/25/2023 1643 Last data filed at 05/25/2023 1300 Gross per 24 hour  Intake 502.32 ml  Output 1050 ml  Net -547.68 ml   Filed Weights   05/23/23 0500 05/24/23 0500 05/25/23 0355  Weight: 121.3 kg 113.7 kg 113.6 kg    Data Reviewed: I have personally reviewed and interpreted daily labs, tele strips, imagings as discussed above. I reviewed all nursing notes, pharmacy notes, vitals, pertinent old records I have discussed plan of care as described above with RN and patient/family.  CBC: Recent Labs  Lab 05/21/23 0320 05/22/23 0352 05/23/23 0442 05/24/23 0533 05/25/23 0430  WBC 11.4* 8.9 6.8 7.7 8.6  HGB 9.2*  9.3* 8.6* 9.4* 9.4*  HCT 28.9* 28.7* 27.1* 29.1* 29.4*  MCV 91.5 90.8 90.9 90.1 92.2  PLT 163 181 157 184 173   Basic Metabolic Panel: Recent Labs  Lab 05/20/23 0421 05/21/23 0320 05/22/23 0352 05/23/23 0442 05/24/23 0533 05/25/23 0430  NA 136  134* 138 142 142 140 142  K 4.2  4.3 3.9 3.8 4.0 4.0 3.6  CL 101  98 101 102 104 101 101  CO2 23  22 27 27 27 27 30   GLUCOSE 241*  242* 185* 261* 213* 188* 152*  BUN 97*  95* 93* 94* 85* 80* 74*  CREATININE 1.66*  1.73* 1.74* 1.60* 1.53* 1.41* 1.36*  CALCIUM 7.8*  8.2* 8.1* 8.3* 8.4* 8.7* 8.7*  MG 2.1  --  1.8 1.9 1.8 2.0  PHOS 6.2* 5.9* 4.3 3.5 2.9 2.6    Studies: ECHO TEE  Result Date: 05/25/2023    TRANSESOPHOGEAL ECHO REPORT   Patient Name:   SHERLYN EBBERT Date of Exam: 05/25/2023 Medical Rec #:  [id number]   Height:       62.0 in Accession #:    [id number]  Weight:       250.4 lb Date of Birth:  Feb 14, 1956   BSA:          2.103 m Patient Age:    67 years    BP:  141/67 mmHg Patient Gender: F           HR:           72 bpm. Exam Location:  [facility] Procedure: Transesophageal Echo, Cardiac Doppler and Color Doppler Indications:     Not listed on TEE check-in sheet  History:         Patient has prior history of Echocardiogram examinations, most                  recent 05/01/2023. Risk Factors:Diabetes and Hypertension.  Sonographer:     Cristela Blue Referring Phys:  [id number] CADENCE H FURTH Diagnosing Phys: Freida Busman McleanMD PROCEDURE: The transesophogeal probe was passed without difficulty through the esophogus of the patient. Sedation performed by different physician. The patient developed no complications during the procedure.  IMPRESSIONS  1. Left ventricular ejection fraction, by estimation, is 55 to 60%. The left ventricle has normal function. The left ventricle has no regional wall motion abnormalities. There is moderate concentric left ventricular hypertrophy.  2. Peak RV-RA gradient 30 mmHg. Right ventricular systolic function is  mildly reduced. The right ventricular size is normal.  3. Left atrial size was moderately dilated. No left atrial/left atrial appendage thrombus was detected.  4. No PFO/ASD by color doppler.  5. The mitral valve is abnormal. Mild to moderate mitral valve regurgitation. No evidence of mitral stenosis.  6. The aortic valve is tricuspid. There is severe calcification of the aortic valve. Aortic valve regurgitation is trivial. Suspect paradoxical low flow/low gradient severe aortic valve stenosis. Aortic valve area, by VTI measures 0.63 cm. Aortic valve  mean gradient measures 26.0 mmHg.  7. Normal caliber thoracic aorta with mild plaque. FINDINGS  Left Ventricle: Left ventricular ejection fraction, by estimation, is 55 to 60%. The left ventricle has normal function. The left ventricle has no regional wall motion abnormalities. The left ventricular internal cavity size was normal in size. There is  moderate concentric left ventricular hypertrophy. Right Ventricle: Peak RV-RA gradient 30 mmHg. The right ventricular size is normal. No increase in right ventricular wall thickness. Right ventricular systolic function is mildly reduced. Left Atrium: Left atrial size was moderately dilated. No left atrial/left atrial appendage thrombus was detected. Right Atrium: Right atrial size was normal in size. Pericardium: Trivial pericardial effusion is present. Mitral Valve: The mitral valve is abnormal. There is mild calcification of the mitral valve leaflet(s). Mild mitral annular calcification. Mild to moderate mitral valve regurgitation. No evidence of mitral valve stenosis. Tricuspid Valve: The tricuspid valve is normal in structure. Tricuspid valve regurgitation is trivial. Aortic Valve: The aortic valve is tricuspid. There is severe calcifcation of the aortic valve. Aortic valve regurgitation is trivial. Severe aortic stenosis is present. Aortic valve mean gradient measures 26.0 mmHg. Aortic valve peak gradient measures 37.9  mmHg. Aortic valve area, by VTI measures 0.63 cm. Pulmonic Valve: The pulmonic valve was normal in structure. Pulmonic valve regurgitation is not visualized. No evidence of pulmonic stenosis. Aorta: Normal caliber thoracic aorta with mild plaque. The aortic root is normal in size and structure. IAS/Shunts: No PFO/ASD by color doppler.  LEFT VENTRICLE PLAX 2D LVOT diam:     1.90 cm LV SV:         43 LV SV Index:   20 LVOT Area:     2.84 cm  AORTIC VALVE AV Area (Vmax):    0.72 cm AV Area (Vmean):   0.69 cm AV Area (VTI):     0.63 cm AV Vmax:  307.67 cm/s AV Vmean:          226.000 cm/s AV VTI:            0.681 m AV Peak Grad:      37.9 mmHg AV Mean Grad:      26.0 mmHg LVOT Vmax:         78.40 cm/s LVOT Vmean:        54.800 cm/s LVOT VTI:          0.151 m LVOT/AV VTI ratio: 0.22  SHUNTS Systemic VTI:  0.15 m Systemic Diam: 1.90 cm Dalton McleanMD Electronically signed by Wilfred Lacy Signature Date/Time: 05/25/2023/12:01:39 PM    Final     Scheduled Meds:  apixaban  5 mg Oral BID   atorvastatin  80 mg Oral Daily   Chlorhexidine Gluconate Cloth  6 each Topical Daily   doxycycline  100 mg Oral Q12H   feeding supplement  237 mL Oral TID BM   fluticasone furoate-vilanterol  1 puff Inhalation Daily   insulin aspart  0-20 Units Subcutaneous Q4H   insulin glargine-yfgn  40 Units Subcutaneous QHS   ipratropium-albuterol  3 mL Nebulization BID   melatonin  5 mg Oral QHS   multivitamin with minerals  1 tablet Oral Daily   mouth rinse  15 mL Mouth Rinse 4 times per day   pantoprazole  40 mg Oral Daily   polyethylene glycol  17 g Oral Daily   predniSONE  40 mg Oral Q breakfast   Followed by   [name] ON 05/27/2023] predniSONE  30 mg Oral Q breakfast   Followed by   [name] ON 05/30/2023] predniSONE  20 mg Oral Q breakfast   Followed by   [name] ON 06/02/2023] predniSONE  10 mg Oral Q breakfast   senna  1 tablet Oral BID   sodium chloride flush  3 mL Intravenous Q12H   spironolactone  25 mg Oral  Daily   torsemide  20 mg Oral Daily   Continuous Infusions:  sodium chloride 20 mL/hr at 05/25/23 1300   amiodarone Stopped (05/25/23 0926)   piperacillin-tazobactam (ZOSYN)  IV 3.375 g (05/25/23 1354)   PRN Meds: acetaminophen **OR** acetaminophen, mouth rinse, polyethylene glycol, senna-docusate  Time spent: 50 minutes  Author: Gillis Santa. MD [facility] 05/25/2023 4:43 PM  To reach On-call, see care teams to locate the attending and reach out to them via [email or web address]. If 7PM-7AM, please contact night-coverage If you still have difficulty reaching the attending provider, please page the [facility] (Director on Call) for [facility] on amion for assistance.

## 2023-05-25 NOTE — Consult Note (Signed)
PHARMACY CONSULT NOTE  Pharmacy Consult for Electrolyte Monitoring and Replacement   Recent Labs: Potassium (mmol/L)  Date Value  05/25/2023 3.6   Magnesium (mg/dL)  Date Value  82/95/6213 2.0   Calcium (mg/dL)  Date Value  08/65/7846 8.7 (L)   Albumin (g/dL)  Date Value  96/29/5284 2.4 (L)  01/09/2018 4.0   Phosphorus (mg/dL)  Date Value  13/24/4010 2.6   Sodium (mmol/L)  Date Value  05/25/2023 142  01/09/2018 138   Assessment: Patient is a 67 y/o F with medical history including morbid obesity, limited functional capacity, nursing home resident admitted with altered mentation, AKI, acute CHF. Pharmacy consulted to assist with electrolyte monitoring and replacement as indicated.   Diuresis: Torsemide 20 mg daily  Goal of Therapy:  Potassium 4.0 - 5.1 mmol/L Magnesium 2.0 - 2.4 mg/dL All Other Electrolytes WNL  Plan:  --K 3.6, Kcl 40 mEq PO x 1 dose --Patient care transferred from PCCM to [facility]. Will discontinue electrolyte consult at this time. Defer further ordering of labs and electrolyte replacement to primary team --Pharmacy will continue to follow along peripherally  Tressie Ellis 05/25/2023 7:54 AM

## 2023-05-25 NOTE — Transfer of Care (Signed)
Immediate Anesthesia Transfer of Care Note  Patient: Sonya Sanchez  Procedure(s) Performed: TRANSESOPHAGEAL ECHOCARDIOGRAM CARDIOVERSION  Patient Location: [facility]  Anesthesia Type:General  Level of Consciousness: drowsy  Airway & Oxygen Therapy: Patient Spontanous Breathing and Patient connected to nasal cannula oxygen  Post-op Assessment: Report given to RN and Post -op Vital signs reviewed and stable  Post vital signs: Reviewed and stable  Last Vitals:  Vitals Value Taken Time  BP 151/54 05/25/23 0842  Temp    Pulse 60 05/25/23 0844  Resp 13 05/25/23 0844  SpO2 99 % 05/25/23 0844  Vitals shown include unvalidated device data.  Last Pain:  Vitals:   05/25/23 0809  TempSrc: Oral  PainSc:          Complications: No notable events documented.

## 2023-05-25 NOTE — Anesthesia Procedure Notes (Signed)
Date/Time: 05/25/2023 8:22 AM  Performed by: Malva Cogan, CRNAPre-anesthesia Checklist: Patient identified, Emergency Drugs available, Suction available, Patient being monitored and Timeout performed Patient Re-evaluated:Patient Re-evaluated prior to induction Oxygen Delivery Method: Supernova nasal CPAP Induction Type: IV induction Placement Confirmation: CO2 detector and positive ETCO2

## 2023-05-26 DIAGNOSIS — I4891 Unspecified atrial fibrillation: Secondary | ICD-10-CM | POA: Diagnosis not present

## 2023-05-26 DIAGNOSIS — Z7189 Other specified counseling: Secondary | ICD-10-CM | POA: Diagnosis not present

## 2023-05-26 DIAGNOSIS — N179 Acute kidney failure, unspecified: Secondary | ICD-10-CM | POA: Diagnosis not present

## 2023-05-26 LAB — CBC
HCT: 29.4 % — ABNORMAL LOW (ref 36.0–46.0)
Hemoglobin: 9.1 g/dL — ABNORMAL LOW (ref 12.0–15.0)
MCH: 28.9 pg (ref 26.0–34.0)
MCHC: 31 g/dL (ref 30.0–36.0)
MCV: 93.3 fL (ref 80.0–100.0)
Platelets: 180 10*3/uL (ref 150–400)
RBC: 3.15 MIL/uL — ABNORMAL LOW (ref 3.87–5.11)
RDW: 19.8 % — ABNORMAL HIGH (ref 11.5–15.5)
WBC: 8.6 10*3/uL (ref 4.0–10.5)
nRBC: 0 % (ref 0.0–0.2)

## 2023-05-26 LAB — COOXEMETRY PANEL
Carboxyhemoglobin: 2.1 % — ABNORMAL HIGH (ref 0.5–1.5)
Methemoglobin: 1.3 % (ref 0.0–1.5)
O2 Saturation: 91.7 %
Total hemoglobin: 9.8 g/dL — ABNORMAL LOW (ref 12.0–16.0)
Total oxygen content: 88.6 %

## 2023-05-26 LAB — BASIC METABOLIC PANEL
Anion gap: 8 (ref 5–15)
BUN: 71 mg/dL — ABNORMAL HIGH (ref 8–23)
CO2: 31 mmol/L (ref 22–32)
Calcium: 8.6 mg/dL — ABNORMAL LOW (ref 8.9–10.3)
Chloride: 102 mmol/L (ref 98–111)
Creatinine, Ser: 1.37 mg/dL — ABNORMAL HIGH (ref 0.44–1.00)
GFR, Estimated: 42 mL/min — ABNORMAL LOW (ref >60)
Glucose, Bld: 229 mg/dL — ABNORMAL HIGH (ref 70–99)
Potassium: 4.3 mmol/L (ref 3.5–5.1)
Sodium: 141 mmol/L (ref 135–145)

## 2023-05-26 LAB — GLUCOSE, CAPILLARY
Glucose-Capillary: 168 mg/dL — ABNORMAL HIGH (ref 70–99)
Glucose-Capillary: 184 mg/dL — ABNORMAL HIGH (ref 70–99)
Glucose-Capillary: 255 mg/dL — ABNORMAL HIGH (ref 70–99)
Glucose-Capillary: 448 mg/dL — ABNORMAL HIGH (ref 70–99)

## 2023-05-26 LAB — MAGNESIUM: Magnesium: 1.8 mg/dL (ref 1.7–2.4)

## 2023-05-26 LAB — CULTURE, BLOOD (ROUTINE X 2): Special Requests: ADEQUATE

## 2023-05-26 LAB — PHOSPHORUS: Phosphorus: 3.6 mg/dL (ref 2.5–4.6)

## 2023-05-26 MED ORDER — IPRATROPIUM-ALBUTEROL 0.5-2.5 (3) MG/3ML IN SOLN: 3.0000 mL | RESPIRATORY_TRACT | Status: DC | PRN

## 2023-05-26 MED ORDER — LIDOCAINE VISCOUS HCL 2 % MT SOLN: 15.0000 mL | OROMUCOSAL | Status: DC | PRN

## 2023-05-26 MED ORDER — MENTHOL 3 MG MT LOZG: 1.0000 | LOZENGE | OROMUCOSAL | Status: DC | PRN

## 2023-05-26 MED ORDER — METOPROLOL SUCCINATE ER 25 MG PO TB24
25.0000 mg | ORAL_TABLET | Freq: Every day | ORAL | Status: DC
  Administered 2023-05-26 – 2023-05-27 (×2): 25 mg via ORAL
  Filled 2023-05-26 (×2): qty 1

## 2023-05-26 MED ORDER — AMIODARONE HCL 200 MG PO TABS: 200.0000 mg | ORAL_TABLET | Freq: Two times a day (BID) | ORAL | Status: DC

## 2023-05-26 MED ORDER — AMIODARONE HCL 200 MG PO TABS
400.0000 mg | ORAL_TABLET | Freq: Two times a day (BID) | ORAL | Status: DC
  Administered 2023-05-26 – 2023-05-27 (×3): 400 mg via ORAL
  Filled 2023-05-26 (×3): qty 2

## 2023-05-26 NOTE — Inpatient Diabetes Management (Signed)
Inpatient Diabetes Program Recommendations  AACE/ADA: New Consensus Statement on Inpatient Glycemic Control   Target Ranges:  Prepandial:   less than 140 mg/dL      Peak postprandial:   less than 180 mg/dL (1-2 hours)      Critically ill patients:  140 - 180 mg/dL    Latest Reference Range & Units 05/25/23 07:58 05/25/23 12:01 05/25/23 16:40 05/25/23 20:17 05/26/23 08:24  Glucose-Capillary 70 - 99 mg/dL 98 147 (H) 829 (H) 562 (H) 168 (H)   Review of Glycemic Control  Diabetes history: DM2 Outpatient Diabetes medications: Basaglar 40 units at bedtime, Humalog 6 units TID, Jardiance 10 mg daily, Ozempic 0.25 mg Qweek Current orders for Inpatient glycemic control: Semglee 40 units at bedtime; Prednisone 40 mg daily (tapering)  Inpatient Diabetes Program Recommendations:    Insulin: Please consider ordering Novolog 0-15 units TID with meals and Novolog 0-5 units at bedtime.  Thanks, Orlando Penner, RN, MSN, CDCES Diabetes Coordinator Inpatient Diabetes Program [phone] (Team Pager from 8am to 5pm)

## 2023-05-26 NOTE — Progress Notes (Signed)
PROGRESS NOTE    Sonya Sanchez   NWG:[id number] DOB: 07/27/1956  DOA: 05/13/2023 Date of Service: 05/26/23 PCP: Oneita Hurt, No     Brief Narrative / Hospital Course:  67 yo F with obesity, from nursing home. She has as history of PE, DVT, aortic stenosis, HTN, chronic bronchitis, OSA, AKI, dermatomyositis, DM2, dyslipidemia, came in due to worsening renal function noted on bloodwork.  She was noted to be lethargic, denied pain anywhere. She was found to have severe hypercapnic acidemia.  She was placed on BIPAP.  PCCM was consulted to due AMS lethargy on BIPAP.    Significant hospital events and procedures 05/15/23 central line placed, nephrology, cardiology consult 05/16/2023-Lasix drip increased, metolazone added 6/18-started antibiotics due to increasing WBC count, pancultures sent 6/19 extubated.  Had respiratory distress and was put on BiPAP.  Started amnio for rapid A-fib. 6/20: lasix drip stopped. 6/21: rested off BPAP. Phenyl drip.  6/22: started on midodrine. 6/24 started torsemide 20 mg p.o. daily as per cardiology 6/25 started spironolactone 25 mg p.o. daily 6/26: amiodarone drip following electrocardioversion for Afib 6/27: po amiodarone, titrating meds   Consultants:  Palliative care  Cardiology   Procedures: Cardioversion       ASSESSMENT & PLAN:   Principal Problem:   AKI (acute kidney injury) (HCC) Active Problems:   Altered mental status   Essential hypertension   Pulmonary embolism (HCC)   OSA (obstructive sleep apnea)   Hyperkalemia   Hyponatremia   Anemia  # Acute on chronic diastolic heart failure Echo (6/24) with EF 60-655, mild LVH, normal RV, mild mitral stenosis mean gradient 7, moderate aortic stenosis with AVA 1.08 cm^2 mean gradient 10, IVC dilated.  Continue torsemide 20 mg daily.  Hold off on Jardiance for now with recent E coli UTI.  Continue spironolactone 25 mg daily.  Given elevated blood pressure, I resumed Toprol 25 mg twice daily.    # Atrial fibrillation anticoagulation with Eliquis Cardiology following, status post electrocardioversion done on 6/26 Continue to monitor on telemetry and follow cardiology for further management Stopped amio gtt, now on po     # AKI secondary to IV contrast, ATN # Hyperkalemia-resolved Creatinine 1.36, gradually improving Monitor renal functions and urine output daily    Sepsis.  Possible sources are hospital-acquired pneumonia versus e coli urosepsis Right lower lobe infiltrate on chest x-ray MRSA positive- s/p vancomycin DC'd on 6/24 after a.m. dose and started doxycycline 100 mg p.o. twice daily for 7 days, Continue zosyn as E coli in urine is resistant to cefepime.  S/p Neo-Synephrine. Procalcitonin is elevated.   I suspect that some of this is diastolic dysfunction; starting midodrine (6/22) to help with BP support S/p midodrine, which was discontinued, blood pressure improved.   6/25  repeat blood cultures NGTD   Acute on chronic hypoxic, hypercarbic respiratory failure secondary to CHF History of OSA, morbid obesity Required BiPAP postextubation. transition from BiPAP to high flow nasal cannula  Continue BiPAP at night and during sleep for history of severe OSA Continue supplemental O2 [place] and gradually wean off    Dermatomyositis Monitor CK Off azathioprine due to concern for sepsis.   H/O DVT, PE Continue Eliquis   Diabetes mellitus SSI coverage Hyperglycemia remains a  challenge Increased Lantus 30u BID   Morbid obesity Body mass index is 45.79 kg/m.  Nutrition Problem: Inadequate oral intake Etiology: inability to eat (pt sedated and ventilated) Interventions: Interventions: Ensure Enlive (each supplement provides 350kcal and 20 grams of protein), MVI, Magic  cup   Advanced care planning "she does not want to consider or discuss care moving forward, and states she will make decisions as needed at the time.  She states that if she is unable to make  decisions herself, her sister will be able to.  She is amenable to outpatient palliative to follow. " See palliative care note 05/26/23            Subjective / Brief ROS:  Patient reports no concerns today, want to go back to facility if possible Denies CP/SOB.  Pain controlled.  Denies new weakness.  Tolerating diet.  Reports no concerns w/ urination/defecation.   Family Communication: none at this time    Objective Findings:  Vitals:   05/26/23 0721 05/26/23 0830 05/26/23 1149 05/26/23 1558  BP:  (!) 151/64 (!) 155/49 (!) 179/52  Pulse:  75 66 66  Resp:   18 18  Temp:  98.2 F (36.8 C) 99.2 F (37.3 C) 98 F (36.7 C)  TempSrc:  Oral    SpO2: 98% 96% 96% 96%  Weight:      Height:        Intake/Output Summary (Last 24 hours) at 05/26/2023 1659 Last data filed at 05/26/2023 1300 Gross per 24 hour  Intake 411.05 ml  Output 2525 ml  Net -2113.95 ml   Filed Weights   05/23/23 0500 05/24/23 0500 05/25/23 0355  Weight: 121.3 kg 113.7 kg 113.6 kg    Examination:  Physical Exam Constitutional:      General: She is not in acute distress.    Appearance: She is obese.  Cardiovascular:     Rate and Rhythm: Normal rate and regular rhythm.     Heart sounds: Murmur heard.  Pulmonary:     Effort: Pulmonary effort is normal.     Breath sounds: Normal breath sounds.  Neurological:     Mental Status: She is alert. Mental status is at baseline.  Psychiatric:        Mood and Affect: Mood normal.        Behavior: Behavior normal.          Scheduled Medications:   [START ON 06/02/2023] amiodarone  200 mg Oral BID   amiodarone  400 mg Oral BID   apixaban  5 mg Oral BID   atorvastatin  80 mg Oral Daily   Chlorhexidine Gluconate Cloth  6 each Topical Daily   doxycycline  100 mg Oral Q12H   feeding supplement  237 mL Oral TID BM   fluticasone furoate-vilanterol  1 puff Inhalation Daily   insulin glargine-yfgn  40 Units Subcutaneous QHS   melatonin  5 mg Oral QHS    metoprolol succinate  25 mg Oral Daily   multivitamin with minerals  1 tablet Oral Daily   mouth rinse  15 mL Mouth Rinse 4 times per day   pantoprazole  40 mg Oral Daily   polyethylene glycol  17 g Oral Daily   [START ON 05/27/2023] predniSONE  30 mg Oral Q breakfast   Followed by   [name] ON 05/30/2023] predniSONE  20 mg Oral Q breakfast   Followed by   [name] ON 06/02/2023] predniSONE  10 mg Oral Q breakfast   senna  1 tablet Oral BID   sodium chloride flush  3 mL Intravenous Q12H   spironolactone  25 mg Oral Daily   torsemide  20 mg Oral Daily    Continuous Infusions:  sodium chloride 20 mL/hr at 05/25/23 1300  PRN Medications:  acetaminophen **OR** acetaminophen, ipratropium-albuterol, mouth rinse, polyethylene glycol, senna-docusate  Antimicrobials from admission:  Anti-infectives (From admission, onward)    Start     Dose/Rate Route Frequency Ordered Stop   05/23/23 2200  doxycycline (VIBRA-TABS) tablet 100 mg        100 mg Oral Every 12 hours 05/23/23 1652 05/30/23 2159   05/19/23 1400  piperacillin-tazobactam (ZOSYN) IVPB 3.375 g        3.375 g 12.5 mL/hr over 240 Minutes Intravenous Every 8 hours 05/19/23 1019 05/25/23 2359   05/19/23 1000  vancomycin (VANCOREADY) IVPB 1500 mg/300 mL  Status:  Discontinued        1,500 mg 150 mL/hr over 120 Minutes Intravenous Every 48 hours 05/17/23 1254 05/23/23 1651   05/18/23 0715  ceFEPIme (MAXIPIME) 2 g in sodium chloride 0.9 % 100 mL IVPB  Status:  Discontinued        2 g 200 mL/hr over 30 Minutes Intravenous Every 12 hours 05/18/23 0707 05/19/23 1019   05/17/23 1000  vancomycin (VANCOREADY) IVPB 2000 mg/400 mL        2,000 mg 200 mL/hr over 120 Minutes Intravenous  Once 05/17/23 0748 05/17/23 1137   05/17/23 0900  ceFEPIme (MAXIPIME) 2 g in sodium chloride 0.9 % 100 mL IVPB  Status:  Discontinued        2 g 200 mL/hr over 30 Minutes Intravenous Every 24 hours 05/17/23 0748 05/18/23 2725           Data Reviewed:   I have personally reviewed the following...  CBC: Recent Labs  Lab 05/22/23 0352 05/23/23 0442 05/24/23 0533 05/25/23 0430 05/26/23 0427  WBC 8.9 6.8 7.7 8.6 8.6  HGB 9.3* 8.6* 9.4* 9.4* 9.1*  HCT 28.7* 27.1* 29.1* 29.4* 29.4*  MCV 90.8 90.9 90.1 92.2 93.3  PLT 181 157 184 173 180   Basic Metabolic Panel: Recent Labs  Lab 05/22/23 0352 05/23/23 0442 05/24/23 0533 05/25/23 0430 05/26/23 0427  NA 142 142 140 142 141  K 3.8 4.0 4.0 3.6 4.3  CL 102 104 101 101 102  CO2 27 27 27 30 31   GLUCOSE 261* 213* 188* 152* 229*  BUN 94* 85* 80* 74* 71*  CREATININE 1.60* 1.53* 1.41* 1.36* 1.37*  CALCIUM 8.3* 8.4* 8.7* 8.7* 8.6*  MG 1.8 1.9 1.8 2.0 1.8  PHOS 4.3 3.5 2.9 2.6 3.6   GFR: Estimated Creatinine Clearance: 46.2 mL/min (A) (by C-G formula based on SCr of 1.37 mg/dL (H)). Liver Function Tests: Recent Labs  Lab 05/20/23 0421 05/21/23 0320 05/22/23 0352 05/23/23 0442  ALBUMIN 2.5* 2.6* 2.5* 2.4*   No results for input(s): "LIPASE", "AMYLASE" in the last 168 hours. No results for input(s): "AMMONIA" in the last 168 hours. Coagulation Profile: No results for input(s): "INR", "PROTIME" in the last 168 hours. Cardiac Enzymes: No results for input(s): "CKTOTAL", "CKMB", "CKMBINDEX", "TROPONINI" in the last 168 hours. BNP (last 3 results) No results for input(s): "PROBNP" in the last 8760 hours. HbA1C: No results for input(s): "HGBA1C" in the last 72 hours. CBG: Recent Labs  Lab 05/25/23 1640 05/25/23 2017 05/26/23 0824 05/26/23 1145 05/26/23 1600  GLUCAP 257* 271* 168* 184* 255*   Lipid Profile: No results for input(s): "CHOL", "HDL", "LDLCALC", "TRIG", "CHOLHDL", "LDLDIRECT" in the last 72 hours. Thyroid Function Tests: Recent Labs    05/24/23 0533  TSH 3.918  FREET4 1.03   Anemia Panel: No results for input(s): "VITAMINB12", "FOLATE", "FERRITIN", "TIBC", "IRON", "RETICCTPCT" in the last 72 hours.  Most Recent Urinalysis On File:     Component Value  Date/Time   COLORURINE YELLOW (A) 05/17/2023 0754   APPEARANCEUR HAZY (A) 05/17/2023 0754   LABSPEC 1.009 05/17/2023 0754   PHURINE 5.0 05/17/2023 0754   GLUCOSEU 150 (A) 05/17/2023 0754   HGBUR MODERATE (A) 05/17/2023 0754   BILIRUBINUR NEGATIVE 05/17/2023 0754   BILIRUBINUR small (A) 12/08/2016 0858   KETONESUR NEGATIVE 05/17/2023 0754   PROTEINUR 30 (A) 05/17/2023 0754   UROBILINOGEN 0.2 12/08/2016 0858   UROBILINOGEN 0.2 07/25/2009 1159   NITRITE NEGATIVE 05/17/2023 0754   LEUKOCYTESUR TRACE (A) 05/17/2023 0754   Sepsis Labs: @LABRCNTIP (procalcitonin:4,lacticidven:4) Microbiology: Recent Results (from the past 240 hour(s))  Culture, Respiratory w Gram Stain     Status: None   Collection Time: 05/17/23  7:51 AM   Specimen: Tracheal Aspirate; Respiratory  Result Value Ref Range Status   Specimen Description   Final    TRACHEAL ASPIRATE Performed at [facility], [address] [place], [place] [address]    Special Requests   Final    NONE Performed at [facility], [address] [place], [place] [address]    Gram Stain   Final    ABUNDANT WBC PRESENT, PREDOMINANTLY PMN RARE SQUAMOUS EPITHELIAL CELLS PRESENT ABUNDANT GRAM POSITIVE COCCI RARE GRAM NEGATIVE RODS Performed at [facility], [address] [place], [place] [address]    Culture   Final    ABUNDANT METHICILLIN RESISTANT STAPHYLOCOCCUS AUREUS   Report Status 05/19/2023 FINAL  Final   Organism ID, Bacteria METHICILLIN RESISTANT STAPHYLOCOCCUS AUREUS  Final      Susceptibility   Methicillin resistant staphylococcus aureus - MIC*    CIPROFLOXACIN >=8 RESISTANT Resistant     ERYTHROMYCIN >=8 RESISTANT Resistant     GENTAMICIN <=0.5 SENSITIVE Sensitive     OXACILLIN >=4 RESISTANT Resistant     TETRACYCLINE <=1 SENSITIVE Sensitive     VANCOMYCIN 1 SENSITIVE Sensitive     TRIMETH/SULFA <=10 SENSITIVE Sensitive     CLINDAMYCIN >=8 RESISTANT Resistant     RIFAMPIN <=0.5 SENSITIVE  Sensitive     Inducible Clindamycin NEGATIVE Sensitive     LINEZOLID 2 SENSITIVE Sensitive     * ABUNDANT METHICILLIN RESISTANT STAPHYLOCOCCUS AUREUS  Urine Culture     Status: Abnormal   Collection Time: 05/17/23  7:54 AM   Specimen: Urine, Clean Catch  Result Value Ref Range Status   Specimen Description   Final    URINE, CLEAN CATCH Performed at [facility], [address] [place], [place] [address]    Special Requests   Final    NONE Performed at [facility], [address] [place], [place] [address]    Culture >=100,000 COLONIES/mL ESCHERICHIA COLI (A)  Final   Report Status 05/19/2023 FINAL  Final   Organism ID, Bacteria ESCHERICHIA COLI (A)  Final      Susceptibility   Escherichia coli - MIC*    AMPICILLIN >=32 RESISTANT Resistant     CEFAZOLIN >=64 RESISTANT Resistant     CEFEPIME 16 RESISTANT Resistant     CEFTRIAXONE >=64 RESISTANT Resistant     CIPROFLOXACIN 1 RESISTANT Resistant     GENTAMICIN <=1 SENSITIVE Sensitive     IMIPENEM <=0.25 SENSITIVE Sensitive     NITROFURANTOIN <=16 SENSITIVE Sensitive     TRIMETH/SULFA >=320 RESISTANT Resistant     AMPICILLIN/SULBACTAM 4 SENSITIVE Sensitive     PIP/TAZO <=4 SENSITIVE Sensitive     * >=100,000 COLONIES/mL ESCHERICHIA COLI  Culture, blood (Routine X 2) w Reflex to ID Panel     Status: Abnormal   Collection Time: 05/17/23  9:28 AM   Specimen: BLOOD  Result Value Ref Range Status   Specimen Description   Final    BLOOD BLOOD RIGHT HAND Performed at [facility], [address] [place], [place] [address]    Special Requests   Final    BOTTLES DRAWN AEROBIC AND ANAEROBIC Blood Culture adequate volume Performed at [facility], [address] [place], [place] [address]    Culture  Setup Time   Final    AEROBIC BOTTLE ONLY GRAM POSITIVE COCCI CRITICAL RESULT CALLED TO, READ BACK BY AND VERIFIED WITH: K.PATEL @2043  05/20/23 LFD    Culture (A)  Final    STAPHYLOCOCCUS  EPIDERMIDIS THE SIGNIFICANCE OF ISOLATING THIS ORGANISM FROM A SINGLE SET OF BLOOD CULTURES WHEN MULTIPLE SETS ARE DRAWN IS UNCERTAIN. PLEASE NOTIFY THE MICROBIOLOGY DEPARTMENT WITHIN ONE WEEK IF SPECIATION AND SENSITIVITIES ARE REQUIRED. Performed at [facility], [address] [place], [place] [address]    Report Status 05/22/2023 FINAL  Final  Blood Culture ID Panel (Reflexed)     Status: Abnormal   Collection Time: 05/17/23  9:28 AM  Result Value Ref Range Status   Enterococcus faecalis NOT DETECTED NOT DETECTED Final   Enterococcus Faecium NOT DETECTED NOT DETECTED Final   Listeria monocytogenes NOT DETECTED NOT DETECTED Final   Staphylococcus species DETECTED (A) NOT DETECTED Final    Comment: CRITICAL RESULT CALLED TO, READ BACK BY AND VERIFIED WITH: K.PATEL @2043  05/20/23 LFD    Staphylococcus aureus (BCID) NOT DETECTED NOT DETECTED Final   Staphylococcus epidermidis DETECTED (A) NOT DETECTED Final    Comment: Methicillin (oxacillin) resistant coagulase negative staphylococcus. Possible blood culture contaminant (unless isolated from more than one blood culture draw or clinical case suggests pathogenicity). No antibiotic treatment is indicated for blood  culture contaminants. CRITICAL RESULT CALLED TO, READ BACK BY AND VERIFIED WITH: K.PATEL @ 2043 05/20/23  LFD    Staphylococcus lugdunensis NOT DETECTED NOT DETECTED Final   Streptococcus species NOT DETECTED NOT DETECTED Final   Streptococcus agalactiae NOT DETECTED NOT DETECTED Final   Streptococcus pneumoniae NOT DETECTED NOT DETECTED Final   Streptococcus pyogenes NOT DETECTED NOT DETECTED Final   A.calcoaceticus-baumannii NOT DETECTED NOT DETECTED Final   Bacteroides fragilis NOT DETECTED NOT DETECTED Final   Enterobacterales NOT DETECTED NOT DETECTED Final   Enterobacter cloacae complex NOT DETECTED NOT DETECTED Final   Escherichia coli NOT DETECTED NOT DETECTED Final   Klebsiella aerogenes NOT DETECTED NOT  DETECTED Final   Klebsiella oxytoca NOT DETECTED NOT DETECTED Final   Klebsiella pneumoniae NOT DETECTED NOT DETECTED Final   Proteus species NOT DETECTED NOT DETECTED Final   Salmonella species NOT DETECTED NOT DETECTED Final   Serratia marcescens NOT DETECTED NOT DETECTED Final   Haemophilus influenzae NOT DETECTED NOT DETECTED Final   Neisseria meningitidis NOT DETECTED NOT DETECTED Final   Pseudomonas aeruginosa NOT DETECTED NOT DETECTED Final   Stenotrophomonas maltophilia NOT DETECTED NOT DETECTED Final   Candida albicans NOT DETECTED NOT DETECTED Final   Candida auris NOT DETECTED NOT DETECTED Final   Candida glabrata NOT DETECTED NOT DETECTED Final   Candida krusei NOT DETECTED NOT DETECTED Final   Candida parapsilosis NOT DETECTED NOT DETECTED Final   Candida tropicalis NOT DETECTED NOT DETECTED Final   Cryptococcus neoformans/gattii NOT DETECTED NOT DETECTED Final   Methicillin resistance mecA/C DETECTED (A) NOT  DETECTED Final    Comment: CRITICAL RESULT CALLED TO, READ BACK BY AND VERIFIED WITH: K.PATEL @2043  05/20/23 LFD Performed at [facility], [address] [place], [place] [address]   Culture, blood (Routine X 2) w Reflex to ID Panel     Status: None   Collection Time: 05/17/23  9:39 AM   Specimen: BLOOD  Result Value Ref Range Status   Specimen Description BLOOD BLOOD LEFT HAND  Final   Special Requests   Final    BOTTLES DRAWN AEROBIC AND ANAEROBIC Blood Culture adequate volume   Culture   Final    NO GROWTH 5 DAYS Performed at [facility], [address] [place], [place] [address]    Report Status 05/22/2023 FINAL  Final  Culture, blood (Routine X 2) w Reflex to ID Panel     Status: None (Preliminary result)   Collection Time: 05/24/23  6:24 AM   Specimen: BLOOD  Result Value Ref Range Status   Specimen Description BLOOD BLOOD RIGHT HAND  Final   Special Requests   Final    BOTTLES DRAWN AEROBIC AND ANAEROBIC Blood Culture adequate  volume   Culture   Final    NO GROWTH 2 DAYS Performed at [facility], [address], [place], [place] [address]    Report Status PENDING  Incomplete  Culture, blood (Routine X 2) w Reflex to ID Panel     Status: None (Preliminary result)   Collection Time: 05/24/23  9:38 AM   Specimen: BLOOD RIGHT HAND  Result Value Ref Range Status   Specimen Description BLOOD RIGHT HAND  Final   Special Requests   Final    BOTTLES DRAWN AEROBIC AND ANAEROBIC Blood Culture adequate volume   Culture   Final    NO GROWTH 2 DAYS Performed at [facility], [address], [place], [place] [address]    Report Status PENDING  Incomplete      Radiology Studies last 3 days: ECHO TEE  Result Date: 05/25/2023    TRANSESOPHOGEAL ECHO REPORT   Patient Name:   SHATANA SAXTON Date of Exam: 05/25/2023 Medical Rec #:  [id number]   Height:       62.0 in Accession #:    [id number]  Weight:       250.4 lb Date of Birth:  [birth year]   BSA:          2.103 m Patient Age:    67 years    BP:           141/67 mmHg Patient Gender: F           HR:           72 bpm. Exam Location:  [facility] Procedure: Transesophageal Echo, Cardiac Doppler and Color Doppler Indications:     Not listed on TEE check-in sheet  History:         Patient has prior history of Echocardiogram examinations, most                  recent 05/01/2023. Risk Factors:Diabetes and Hypertension.  Sonographer:     Cristela Blue Referring Phys:  [id number] CADENCE H FURTH Diagnosing Phys: Freida Busman McleanMD PROCEDURE: The transesophogeal probe was passed without difficulty through the esophogus of the patient. Sedation performed by different physician. The patient developed no complications during the procedure.  IMPRESSIONS  1. Left ventricular ejection fraction, by estimation, is 55 to 60%. The left ventricle has normal function. The left ventricle has no  regional wall motion abnormalities. There is moderate concentric left ventricular hypertrophy.  2. Peak RV-RA  gradient 30 mmHg. Right ventricular systolic function is mildly reduced. The right ventricular size is normal.  3. Left atrial size was moderately dilated. No left atrial/left atrial appendage thrombus was detected.  4. No PFO/ASD by color doppler.  5. The mitral valve is abnormal. Mild to moderate mitral valve regurgitation. No evidence of mitral stenosis.  6. The aortic valve is tricuspid. There is severe calcification of the aortic valve. Aortic valve regurgitation is trivial. Suspect paradoxical low flow/low gradient severe aortic valve stenosis. Aortic valve area, by VTI measures 0.63 cm. Aortic valve  mean gradient measures 26.0 mmHg.  7. Normal caliber thoracic aorta with mild plaque. FINDINGS  Left Ventricle: Left ventricular ejection fraction, by estimation, is 55 to 60%. The left ventricle has normal function. The left ventricle has no regional wall motion abnormalities. The left ventricular internal cavity size was normal in size. There is  moderate concentric left ventricular hypertrophy. Right Ventricle: Peak RV-RA gradient 30 mmHg. The right ventricular size is normal. No increase in right ventricular wall thickness. Right ventricular systolic function is mildly reduced. Left Atrium: Left atrial size was moderately dilated. No left atrial/left atrial appendage thrombus was detected. Right Atrium: Right atrial size was normal in size. Pericardium: Trivial pericardial effusion is present. Mitral Valve: The mitral valve is abnormal. There is mild calcification of the mitral valve leaflet(s). Mild mitral annular calcification. Mild to moderate mitral valve regurgitation. No evidence of mitral valve stenosis. Tricuspid Valve: The tricuspid valve is normal in structure. Tricuspid valve regurgitation is trivial. Aortic Valve: The aortic valve is tricuspid. There is severe calcifcation of the aortic valve. Aortic valve regurgitation is trivial. Severe aortic stenosis is present. Aortic valve mean gradient  measures 26.0 mmHg. Aortic valve peak gradient measures 37.9 mmHg. Aortic valve area, by VTI measures 0.63 cm. Pulmonic Valve: The pulmonic valve was normal in structure. Pulmonic valve regurgitation is not visualized. No evidence of pulmonic stenosis. Aorta: Normal caliber thoracic aorta with mild plaque. The aortic root is normal in size and structure. IAS/Shunts: No PFO/ASD by color doppler.  LEFT VENTRICLE PLAX 2D LVOT diam:     1.90 cm LV SV:         43 LV SV Index:   20 LVOT Area:     2.84 cm  AORTIC VALVE AV Area (Vmax):    0.72 cm AV Area (Vmean):   0.69 cm AV Area (VTI):     0.63 cm AV Vmax:           307.67 cm/s AV Vmean:          226.000 cm/s AV VTI:            0.681 m AV Peak Grad:      37.9 mmHg AV Mean Grad:      26.0 mmHg LVOT Vmax:         78.40 cm/s LVOT Vmean:        54.800 cm/s LVOT VTI:          0.151 m LVOT/AV VTI ratio: 0.22  SHUNTS Systemic VTI:  0.15 m Systemic Diam: 1.90 cm Dalton McleanMD Electronically signed by Wilfred Lacy Signature Date/Time: 05/25/2023/12:01:39 PM    Final              LOS: 12 days     Sunnie Nielsen, DO [facility] 05/26/2023, 4:59 PM    Dictation software may have been used to  generate the above note. Typos may occur and escape review in typed/dictated notes. Please contact Dr Lyn HollingsheadAlexander directly for clarity if needed.  Staff may message me via secure chat in Epic  but this may not receive an immediate response,  please page me for urgent matters!  If 7PM-7AM, please contact night coverage [email or web address]

## 2023-05-26 NOTE — TOC Progression Note (Signed)
Transition of Care [facility]) - Progression Note    Patient Details  Name: Sonya Sanchez MRN: [id number] Date of Birth: 1956/02/24  Transition of Care [facility]) CM/SW Contact  Margarito Liner, LCSW Phone Number: 05/26/2023, 10:42 AM  Clinical Narrative: Patient confirmed she is a long-term resident at [facility]. She stated she has been there since January. Will follow progress and facilitate return once stable. SNF admissions coordinator confirmed patient used CPAP prior to admission.    Expected Discharge Plan: Skilled Nursing Facility Barriers to Discharge: Continued Medical Work up  Expected Discharge Plan and Services       Living arrangements for the past 2 months: Skilled Nursing Facility                                       Social Determinants of Health (SDOH) Interventions SDOH Screenings   Food Insecurity: No Food Insecurity (05/18/2023)  Housing: Low Risk  (05/18/2023)  Transportation Needs: No Transportation Needs (05/18/2023)  Utilities: Not At Risk (05/18/2023)  Tobacco Use: Medium Risk (05/25/2023)    Readmission Risk Interventions    05/18/2023    1:32 PM  Readmission Risk Prevention Plan  Transportation Screening Complete  Medication Review (RN Care Manager) Complete  PCP or Specialist appointment within 3-5 days of discharge Complete  SW Recovery Care/Counseling Consult Complete  Palliative Care Screening Not Applicable  Skilled Nursing Facility Complete

## 2023-05-26 NOTE — Progress Notes (Signed)
Nutrition Follow-up  DOCUMENTATION CODES:   Obesity unspecified  INTERVENTION:   -Continue MVI with minerals daily -Continue Ensure Enlive po TID, each supplement provides 350 kcal and 20 grams of protein -Continue Magic cup TID with meals, each supplement provides 290 kcal and 9 grams of protein  -Downgrade to dysphagia 2 diet with thin liquids per SLP recommendations  NUTRITION DIAGNOSIS:   Inadequate oral intake related to inability to eat (pt sedated and ventilated) as evidenced by NPO status.  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  6/22- s/p BSE- advanced to dysphagia 2 diet with nectar thick liquids 6/23- s/p BSE- advanced to dysphagia 2 diet with thin liquids 6/26- s/p TEE and cardioversion  Reviewed I/O's: -493 ml x 24 hours and -5.2 L since admission  UOP: 600 ml x 24 hours   Pt transitioned out of ICU to SDU yesterday.   Pt with still minimal oral intake. Noted meal completions 0-25%. Pt is drinking supplements well.   Noted pt was placed on a heart healthy diet s/p TEE and cardioversion. Will downgrade to dysphagia 2 diet as recommended by SLP.   Pt has experienced a 10% wt loss over th epast week, likely related to diuresis. Noted -5.2 L since admission.   Per palliative care notes, pt desires full scope care.   Medications reviewed and include melatonin, miralax, demadex, prednisone, and senokot.   Labs reviewed: CBGS: 168  (inpatient orders for glycemic control are 40 units insulin glargine-yfgn daily).    Diet Order:   Diet Order             Diet Heart Room service appropriate? Yes; Fluid consistency: Thin  Diet effective now                   EDUCATION NEEDS:   Education needs have been addressed  Skin:  Skin Assessment: Skin Integrity Issues: Skin Integrity Issues:: Other (Comment) Other: MASD to bilateral groin, thighs, and breasts  Last BM:  05/25/23 (type 6)  Height:   Ht Readings from Last 1 Encounters:  05/17/23 5' 2.01" (1.575 m)    Weight:   Wt Readings from Last 1 Encounters:  05/25/23 113.6 kg    Ideal Body Weight:  50 kg  BMI:  Body mass index is 45.79 kg/m.  Estimated Nutritional Needs:   Kcal:  1750-1950  Protein:  100-115 grams  Fluid:  1.8-2.0 L    Levada Schilling, RD, LDN, CDCES Registered Dietitian II Certified Diabetes Care and Education Specialist Please refer to [facility] for RD and/or RD on-call/weekend/after hours pager

## 2023-05-26 NOTE — NC FL2 (Addendum)
[place] MEDICAID FL2 LEVEL OF CARE FORM     IDENTIFICATION  Patient Name: Sonya Sanchez Birthdate: 05-15-1956 Sex: female Admission Date (Current Location): 05/13/2023  [facility] and [place] Number:  Chiropodist and Address:  [facility], [address], [place], [place] [address]      Provider Number: [id number]  Attending Physician Name and Address:  Sunnie Nielsen, DO  Relative Name and Phone Number:       Current Level of Care: Hospital Recommended Level of Care: Skilled Nursing Facility Prior Approval Number:    Date Approved/Denied:   PASRR Number: [id number]  Discharge Plan: SNF    Current Diagnoses: Patient Active Problem List   Diagnosis Date Noted   Atrial fibrillation with RVR (HCC) 05/22/2023   Mitral valve stenosis 05/22/2023   Chronic diastolic CHF (congestive heart failure) (HCC) 05/20/2023   Pneumonia due to infectious organism 05/19/2023   Hyperkalemia 05/14/2023   AKI (acute kidney injury) (HCC) 05/14/2023   Hyponatremia 05/14/2023   Anemia 05/14/2023   Coronary artery disease due to lipid rich plaque 05/05/2023   Acute on chronic heart failure with preserved ejection fraction (HFpEF) (HCC) 05/02/2023   Acute respiratory failure with hypoxia and hypercapnia (HCC) 04/30/2023   Acute metabolic encephalopathy 04/30/2023   Acute on chronic respiratory failure with hypoxemia (HCC) 04/29/2023   Bedbound 04/29/2023   Adult onset dermatomyositis (HCC) 04/29/2023   Altered mental status 04/29/2023   Irritable bowel syndrome with constipation 05/11/2019   Post-nasal drip 02/21/2019   Allergic rhinitis 02/21/2019   Cataracts, bilateral 01/27/2018   Uncontrolled type 2 diabetes mellitus with hyperglycemia (HCC) 01/19/2018   Bronchitis 12/22/2016   OSA (obstructive sleep apnea) 12/08/2016   Elevated troponin 08/11/2015   Non-ST elevation (NSTEMI) myocardial infarction [facility])    Poorly controlled type 2 diabetes  mellitus with circulatory disorder (HCC)    Aortic valve stenosis 01/30/2014   Chronic venous insufficiency 08/03/2013   Pulmonary embolism (HCC) 03/21/2013   DVT (deep venous thrombosis) (HCC) 03/21/2013   Hyperlipidemia 02/13/2013   Morbid obesity (HCC) 02/13/2013   Essential hypertension 12/25/2012    Orientation RESPIRATION BLADDER Height & Weight     Self, Time, Situation, Place  O2, Other (Comment) (Nasal Cannula 2 L. Uses CPAP QHS at SNF.) Incontinent, External catheter Weight: 250 lb 7.1 oz (113.6 kg) Height:  5' 2.01" (157.5 cm)  BEHAVIORAL SYMPTOMS/MOOD NEUROLOGICAL BOWEL NUTRITION STATUS   (None)  (None) Continent Diet Cardiac  AMBULATORY STATUS COMMUNICATION OF NEEDS Skin     Verbally Skin abrasions, Bruising, Other (Comment) (Erythema/redness, scratch marks. MASD on thighs, groin, breast: No dressing.)                       Personal Care Assistance Level of Assistance              Functional Limitations Info  Sight, Hearing, Speech Sight Info: Adequate Hearing Info: Adequate Speech Info: Adequate    SPECIAL CARE FACTORS FREQUENCY                       Contractures Contractures Info: Not present    Additional Factors Info  Isolation Precautions Code Status Info: Full code Allergies Info: Sulfa antibiotics     Isolation Precautions Info: Contact: MRSA     Current Medications (05/26/2023):  This is the current hospital active medication list Current Facility-Administered Medications  Medication Dose Route Frequency Provider Last Rate Last Admin   0.9 %  sodium chloride infusion   Intravenous Continuous Furth, Cadence H, PA-C 20 mL/hr at 05/25/23 1300 Infusion Verify at 05/25/23 1300   acetaminophen (TYLENOL) tablet 650 mg  650 mg Oral Q6H PRN Nolberto Hanlon, MD   650 mg at 05/24/23 9604   Or   acetaminophen (TYLENOL) suppository 650 mg  650 mg Rectal Q6H PRN Nolberto Hanlon, MD       amiodarone (NEXTERONE PREMIX) 360-4.14 MG/200ML-% (1.8 mg/mL) IV  infusion  30 mg/hr Intravenous Continuous Mannam, Praveen, MD   Stopped at 05/25/23 0926   apixaban (ELIQUIS) tablet 5 mg  5 mg Oral BID Mannam, Praveen, MD   5 mg at 05/26/23 0934   atorvastatin (LIPITOR) tablet 80 mg  80 mg Oral Daily Eula Listen M, PA-C   80 mg at 05/26/23 5409   Chlorhexidine Gluconate Cloth 2 % PADS 6 each  6 each Topical Daily Mannam, Colbert Coyer, MD   6 each at 05/26/23 0934   doxycycline (VIBRA-TABS) tablet 100 mg  100 mg Oral Q12H Tressie Ellis, RPH   100 mg at 05/26/23 0933   feeding supplement (ENSURE ENLIVE / ENSURE PLUS) liquid 237 mL  237 mL Oral TID BM Gillis Santa, MD   237 mL at 05/26/23 0934   fluticasone furoate-vilanterol (BREO ELLIPTA) 100-25 MCG/ACT 1 puff  1 puff Inhalation Daily Nolberto Hanlon, MD   1 puff at 05/26/23 0934   insulin glargine-yfgn (SEMGLEE) injection 40 Units  40 Units Subcutaneous QHS Gillis Santa, MD   40 Units at 05/25/23 2206   ipratropium-albuterol (DUONEB) 0.5-2.5 (3) MG/3ML nebulizer solution 3 mL  3 mL Nebulization Q4H PRN Sunnie Nielsen, DO       melatonin tablet 5 mg  5 mg Oral QHS Gillis Santa, MD   5 mg at 05/25/23 2124   multivitamin with minerals tablet 1 tablet  1 tablet Oral Daily Earley Brooke, MD   1 tablet at 05/25/23 1355   Oral care mouth rinse  15 mL Mouth Rinse PRN Mannam, Praveen, MD       Oral care mouth rinse  15 mL Mouth Rinse 4 times per day Earley Brooke, MD   15 mL at 05/26/23 0935   pantoprazole (PROTONIX) EC tablet 40 mg  40 mg Oral Daily Tressie Ellis, RPH   40 mg at 05/26/23 0935   polyethylene glycol (MIRALAX / GLYCOLAX) packet 17 g  17 g Oral Daily PRN Mannam, Praveen, MD       polyethylene glycol (MIRALAX / GLYCOLAX) packet 17 g  17 g Oral Daily Tressie Ellis, RPH   17 g at 05/26/23 0934   [START ON 05/27/2023] predniSONE (DELTASONE) tablet 30 mg  30 mg Oral Q breakfast Gillis Santa, MD       Followed by   [name] ON 05/30/2023] predniSONE (DELTASONE) tablet 20 mg  20 mg Oral Q breakfast Gillis Santa,  MD       Followed by   [name] ON 06/02/2023] predniSONE (DELTASONE) tablet 10 mg  10 mg Oral Q breakfast Gillis Santa, MD       senna (SENOKOT) tablet 8.6 mg  1 tablet Oral BID Dorothea Ogle B, RPH   8.6 mg at 05/26/23 0932   senna-docusate (Senokot-S) tablet 1 tablet  1 tablet Oral QHS PRN Mannam, Colbert Coyer, MD   1 tablet at 05/21/23 0833   sodium chloride flush (NS) 0.9 % injection 3 mL  3 mL Intravenous Conchita Paris, MD   3 mL at 05/26/23 [phone]  spironolactone (ALDACTONE) tablet 25 mg  25 mg Oral Daily Hammock, Sheri, NP   25 mg at 05/26/23 0933   torsemide (DEMADEX) tablet 20 mg  20 mg Oral Daily Laurey Morale, MD   20 mg at 05/26/23 1610     Discharge Medications: Please see discharge summary for a list of discharge medications.  Relevant Imaging Results:  Relevant Lab Results:   Additional Information SS#: [id number]  Margarito Liner, LCSW

## 2023-05-26 NOTE — Progress Notes (Addendum)
Daily Progress Note   Patient Name: Sonya Sanchez       Date: 05/26/2023 DOB: 01-19-1956  Age: 67 y.o. MRN#: [id number] Attending Physician: Sunnie Nielsen, DO Primary Care Physician: Pcp, No Admit Date: 05/13/2023  Reason for Consultation/Follow-up: Establishing goals of care  Subjective: Notes and labs reviewed.  In to see patient.  She is resting in bed, out of ICU stepdown.  She states she has hoped that she would feel better than what she is.  She discusses that she is bedbound at baseline, and is hopeful to be able to return to her living facility soon.  She states she just wants a chance to back to her facility to rest and relax.  We discussed her previous admission and current admission.  We discussed her diagnoses and testing. We discussed following up with cardiology outpatient. The difference between an aggressive medical intervention path and a comfort care path was discussed.  Attempted to elicit her feelings on this.  She states that at this time and during this admission, she does not want to consider or discuss care moving forward, and states she will make decisions as needed at the time.  She states that if she is unable to make decisions herself, her sister will be able to.  She is amenable to outpatient palliative to follow.  Length of Stay: 12  Current Medications: Scheduled Meds:   [START ON 06/02/2023] amiodarone  200 mg Oral BID   amiodarone  400 mg Oral BID   apixaban  5 mg Oral BID   atorvastatin  80 mg Oral Daily   Chlorhexidine Gluconate Cloth  6 each Topical Daily   doxycycline  100 mg Oral Q12H   feeding supplement  237 mL Oral TID BM   fluticasone furoate-vilanterol  1 puff Inhalation Daily   insulin glargine-yfgn  40 Units Subcutaneous QHS   melatonin  5 mg  Oral QHS   metoprolol succinate  25 mg Oral Daily   multivitamin with minerals  1 tablet Oral Daily   mouth rinse  15 mL Mouth Rinse 4 times per day   pantoprazole  40 mg Oral Daily   polyethylene glycol  17 g Oral Daily   [START ON 05/27/2023] predniSONE  30 mg Oral Q breakfast   Followed by   [name] ON 05/30/2023] predniSONE  20 mg Oral Q breakfast   Followed by   [name] ON 06/02/2023] predniSONE  10 mg Oral Q breakfast   senna  1 tablet Oral BID   sodium chloride flush  3 mL Intravenous Q12H   spironolactone  25 mg Oral Daily   torsemide  20 mg Oral Daily    Continuous Infusions:  sodium chloride 20 mL/hr at 05/25/23 1300    PRN Meds: acetaminophen **OR** acetaminophen, ipratropium-albuterol, mouth rinse, polyethylene glycol, senna-docusate  Physical Exam Pulmonary:     Effort: Pulmonary effort is normal.  Neurological:     Mental Status: She is alert.             Vital Signs: BP (!) 155/49 (BP Location: Right Arm)   Pulse 66   Temp 99.2 F (37.3 C)   Resp 18   Ht 5' 2.01" (1.575 m)   Wt 113.6 kg   SpO2 96%   BMI 45.79 kg/m  SpO2: SpO2: 96 % O2 Device: O2 Device: Nasal Cannula O2 Flow Rate: O2 Flow Rate (L/min): 2 L/min  Intake/output summary:  Intake/Output Summary (Last 24 hours) at 05/26/2023 1526 Last data filed at 05/26/2023 1300 Gross per 24 hour  Intake 411.05 ml  Output 2525 ml  Net -2113.95 ml   LBM: Last BM Date : 05/25/23 Baseline Weight: Weight: 132.7 kg Most recent weight: Weight: 113.6 kg    Patient Active Problem List   Diagnosis Date Noted   Atrial fibrillation with RVR (HCC) 05/22/2023   Mitral valve stenosis 05/22/2023   Chronic diastolic CHF (congestive heart failure) (HCC) 05/20/2023   Pneumonia due to infectious organism 05/19/2023   Hyperkalemia 05/14/2023   AKI (acute kidney injury) (HCC) 05/14/2023   Hyponatremia 05/14/2023   Anemia 05/14/2023   Coronary artery disease due to lipid rich plaque 05/05/2023   Acute on chronic  heart failure with preserved ejection fraction (HFpEF) (HCC) 05/02/2023   Acute respiratory failure with hypoxia and hypercapnia (HCC) 04/30/2023   Acute metabolic encephalopathy 04/30/2023   Acute on chronic respiratory failure with hypoxemia (HCC) 04/29/2023   Bedbound 04/29/2023   Adult onset dermatomyositis (HCC) 04/29/2023   Altered mental status 04/29/2023   Irritable bowel syndrome with constipation 05/11/2019   Post-nasal drip 02/21/2019   Allergic rhinitis 02/21/2019   Cataracts, bilateral 01/27/2018   Uncontrolled type 2 diabetes mellitus with hyperglycemia (HCC) 01/19/2018   Bronchitis 12/22/2016   OSA (obstructive sleep apnea) 12/08/2016   Elevated troponin 08/11/2015   Non-ST elevation (NSTEMI) myocardial infarction [facility])    Poorly controlled type 2 diabetes mellitus with circulatory disorder (HCC)    Aortic valve stenosis 01/30/2014   Chronic venous insufficiency 08/03/2013   Pulmonary embolism (HCC) 03/21/2013   DVT (deep venous thrombosis) (HCC) 03/21/2013   Hyperlipidemia 02/13/2013   Morbid obesity (HCC) 02/13/2013   Essential hypertension 12/25/2012    Palliative Care Assessment & Plan    Recommendations/Plan: Patient does not want to discuss goals of care and wants to make decisions as they come up.  She states she just wants to get back to her facility and be able to rest. Recommend outpatient palliative As goals are set for full code/full scope, PMT will sign off at this time.  Please reconsult if needs arise.  Code Status:    Code Status Orders  (From admission, onward)           Start     Ordered   05/14/23 0041  Full code  Continuous       Question:  By:  Answer:  Other   05/14/23 0041           Code Status History     Date Active Date Inactive Code Status Order ID Comments User Context   04/29/2023 1754 05/05/2023 2219 Full Code [id number]  Lovenia Kim, DO ED   08/08/2015 0149 08/11/2015 2056 Full Code [id number]  Hower, Cletis [place], MD ED        Prognosis: Poor overall   Thank you for allowing the Palliative Medicine Team to assist in the care of this patient.   Morton Stall, NP  Please contact Palliative Medicine Team phone at [phone] for questions and concerns.

## 2023-05-26 NOTE — Progress Notes (Signed)
Advanced Heart Failure Rounding Note  PCP-Cardiologist: Julien Nordmann, MD   Subjective:    She underwent TEE guided cardioversion yesterday and she is maintaining in sinus rhythm.  TEE: EF 55-60% with moderate LVH, normal RV, suspected low flow/low gradient severe AS with mean gradient 28 mmHg and AVA 0.5 cm^2.    Objective:   Weight Range: 113.6 kg Body mass index is 45.79 kg/m.   Vital Signs:   Temp:  [98 F (36.7 C)-99.2 F (37.3 C)] 99.2 F (37.3 C) (06/27 1149) Pulse Rate:  [63-80] 66 (06/27 1149) Resp:  [16-27] 18 (06/27 1149) BP: (129-159)/(39-103) 155/49 (06/27 1149) SpO2:  [92 %-100 %] 96 % (06/27 1149) FiO2 (%):  [28 %] 28 % (06/26 2218) Last BM Date : 05/25/23  Weight change: Filed Weights   05/23/23 0500 05/24/23 0500 05/25/23 0355  Weight: 121.3 kg 113.7 kg 113.6 kg    Intake/Output:   Intake/Output Summary (Last 24 hours) at 05/26/2023 1151 Last data filed at 05/26/2023 1039 Gross per 24 hour  Intake 412.76 ml  Output 1325 ml  Net -912.24 ml       Physical Exam    General:  Well appearing. No resp difficulty HEENT: Normal Neck: Supple. JVP 8 cm. Carotids 2+ bilat; no bruits. No lymphadenopathy or thyromegaly appreciated. Cor: PMI nondisplaced. Regular rate & rhythm. No rubs, gallops .  3 out of 6 systolic murmur in the aortic area. Lungs: Clear Abdomen: Soft, nontender, nondistended. No hepatosplenomegaly. No bruits or masses. Good bowel sounds. Extremities: No cyanosis, clubbing, rash, edema Neuro: Alert & orientedx3, cranial nerves grossly intact. moves all 4 extremities w/o difficulty. Affect pleasant   Telemetry   AF 80s => NSR 60s (personally reviewed)   Labs    CBC Recent Labs    05/25/23 0430 05/26/23 0427  WBC 8.6 8.6  HGB 9.4* 9.1*  HCT 29.4* 29.4*  MCV 92.2 93.3  PLT 173 180    Basic Metabolic Panel Recent Labs    96/29/52 0430 05/26/23 0427  NA 142 141  K 3.6 4.3  CL 101 102  CO2 30 31  GLUCOSE 152*  229*  BUN 74* 71*  CREATININE 1.36* 1.37*  CALCIUM 8.7* 8.6*  MG 2.0 1.8  PHOS 2.6 3.6    Liver Function Tests No results for input(s): "AST", "ALT", "ALKPHOS", "BILITOT", "PROT", "ALBUMIN" in the last 72 hours.  No results for input(s): "LIPASE", "AMYLASE" in the last 72 hours. Cardiac Enzymes No results for input(s): "CKTOTAL", "CKMB", "CKMBINDEX", "TROPONINI" in the last 72 hours.  BNP: BNP (last 3 results) Recent Labs    04/29/23 1507  BNP 386.8*     ProBNP (last 3 results) No results for input(s): "PROBNP" in the last 8760 hours.   D-Dimer No results for input(s): "DDIMER" in the last 72 hours. Hemoglobin A1C No results for input(s): "HGBA1C" in the last 72 hours. Fasting Lipid Panel No results for input(s): "CHOL", "HDL", "LDLCALC", "TRIG", "CHOLHDL", "LDLDIRECT" in the last 72 hours. Thyroid Function Tests Recent Labs    05/24/23 0533  TSH 3.918     Other results:   Imaging    No results found.   Medications:     Scheduled Medications:  [START ON 06/02/2023] amiodarone  200 mg Oral BID   amiodarone  400 mg Oral BID   apixaban  5 mg Oral BID   atorvastatin  80 mg Oral Daily   Chlorhexidine Gluconate Cloth  6 each Topical Daily   doxycycline  100 mg  Oral Q12H   feeding supplement  237 mL Oral TID BM   fluticasone furoate-vilanterol  1 puff Inhalation Daily   insulin glargine-yfgn  40 Units Subcutaneous QHS   melatonin  5 mg Oral QHS   metoprolol succinate  25 mg Oral Daily   multivitamin with minerals  1 tablet Oral Daily   mouth rinse  15 mL Mouth Rinse 4 times per day   pantoprazole  40 mg Oral Daily   polyethylene glycol  17 g Oral Daily   [START ON 05/27/2023] predniSONE  30 mg Oral Q breakfast   Followed by   [name] ON 05/30/2023] predniSONE  20 mg Oral Q breakfast   Followed by   [name] ON 06/02/2023] predniSONE  10 mg Oral Q breakfast   senna  1 tablet Oral BID   sodium chloride flush  3 mL Intravenous Q12H   spironolactone  25 mg Oral  Daily   torsemide  20 mg Oral Daily    Infusions:  sodium chloride 20 mL/hr at 05/25/23 1300    PRN Medications: acetaminophen **OR** acetaminophen, ipratropium-albuterol, mouth rinse, polyethylene glycol, senna-docusate   Assessment/Plan   1. Acute on chronic diastolic CHF: Echo (6/24) with EF 60-655, mild LVH, normal RV, mild mitral stenosis mean gradient 7, moderate aortic stenosis with AVA 1.08 cm^2 mean gradient 10, IVC dilated.  TEE yesterday showed EF 55-60% with moderate LVH, normal RV, suspected low flow/low gradient severe AS with mean gradient 28 mmHg and AVA 0.5 cm^2.   Atrial fibrillation contributes to CHF, now s/p DCCV.    - Continue torsemide 20 mg daily.  - Hold off on Jardiance for now with recent E coli UTI.  - Continue spironolactone 25 mg daily.  Given elevated blood pressure, I resumed Toprol 25 mg twice daily.  2.  Septic shock: Hospital-acquired PNA versus E coli urosepsis.  She has been titrated off phenylephrine and midodrine. PCT was elevated.  BP now elevated.  - She remains on Zosyn with E coli in urine resistant to cefepime.   3. Acute on chronic hypercarbic hypoxemic respiratory failure: Multifactorial with OHS/OSA, suspected PNA, CHF.  Baseline on home oxygen + CPAP.  - She is on prednisone, will need to titrate off steroids per primary team.  - Continue oxygen 2L [place] + Bipap, suspect now at baseline oxygen requirement.  - Gentle diuresis with torsemide as above.   4. Atrial fibrillation: New onset, first noted on 6/19.  She was in rate-controlled AF at admission, now on amiodarone gtt and apixaban.  AF likely worsens her CHF.  Now s/p TEE-guided DCCV.  - Restart Toprol XL 25 mg daily.  I switched amiodarone to oral. - Continue apixaban.   5. H/o DVT/PE: Poor mobility at home, wheelchair-bound.  - Chronic apixaban.  6. Aortic stenosis: TEE yesterday suggested paradoxical low flow/low gradient severe AS with mean gradient 28 mmHg and AVA 0.5 cm^2.   Will need workup for possible TAVR (as outpatient), but given very limited mobility, I I agree that she is likely not a good candidate.  7. CAD: NSTEMI in 6/19, cath showed CTO distal RCA and 70-80% small D1 not amenable to intervention. No chest pain.  Continue medical therapy. - Continue atorvastatin.  - No ASA given apixaban use.   Length of Stay: 12  Lorine Bears, MD  05/26/2023, 11:51 AM

## 2023-05-27 DIAGNOSIS — I5033 Acute on chronic diastolic (congestive) heart failure: Secondary | ICD-10-CM | POA: Diagnosis not present

## 2023-05-27 DIAGNOSIS — J9621 Acute and chronic respiratory failure with hypoxia: Secondary | ICD-10-CM | POA: Diagnosis not present

## 2023-05-27 DIAGNOSIS — N179 Acute kidney failure, unspecified: Secondary | ICD-10-CM | POA: Diagnosis not present

## 2023-05-27 DIAGNOSIS — I4891 Unspecified atrial fibrillation: Secondary | ICD-10-CM | POA: Diagnosis not present

## 2023-05-27 LAB — CBC
HCT: 30.1 % — ABNORMAL LOW (ref 36.0–46.0)
Hemoglobin: 10 g/dL — ABNORMAL LOW (ref 12.0–15.0)
MCH: 29.6 pg (ref 26.0–34.0)
MCHC: 33.2 g/dL (ref 30.0–36.0)
MCV: 89.1 fL (ref 80.0–100.0)
Platelets: 170 10*3/uL (ref 150–400)
RBC: 3.38 MIL/uL — ABNORMAL LOW (ref 3.87–5.11)
RDW: 19.9 % — ABNORMAL HIGH (ref 11.5–15.5)
WBC: 9.3 10*3/uL (ref 4.0–10.5)
nRBC: 0 % (ref 0.0–0.2)

## 2023-05-27 LAB — BASIC METABOLIC PANEL
Anion gap: 11 (ref 5–15)
BUN: 70 mg/dL — ABNORMAL HIGH (ref 8–23)
CO2: 30 mmol/L (ref 22–32)
Calcium: 8.8 mg/dL — ABNORMAL LOW (ref 8.9–10.3)
Chloride: 101 mmol/L (ref 98–111)
Creatinine, Ser: 1.24 mg/dL — ABNORMAL HIGH (ref 0.44–1.00)
GFR, Estimated: 47 mL/min — ABNORMAL LOW (ref >60)
Glucose, Bld: 193 mg/dL — ABNORMAL HIGH (ref 70–99)
Potassium: 4.3 mmol/L (ref 3.5–5.1)
Sodium: 142 mmol/L (ref 135–145)

## 2023-05-27 LAB — GLUCOSE, CAPILLARY
Glucose-Capillary: 420 mg/dL — ABNORMAL HIGH (ref 70–99)
Glucose-Capillary: 226 mg/dL — ABNORMAL HIGH (ref 70–99)
Glucose-Capillary: 170 mg/dL — ABNORMAL HIGH (ref 70–99)
Glucose-Capillary: 160 mg/dL — ABNORMAL HIGH (ref 70–99)
Glucose-Capillary: 229 mg/dL — ABNORMAL HIGH (ref 70–99)

## 2023-05-27 LAB — GLUCOSE, RANDOM: Glucose, Bld: 494 mg/dL — ABNORMAL HIGH (ref 70–99)

## 2023-05-27 LAB — CULTURE, BLOOD (ROUTINE X 2): Culture: NO GROWTH

## 2023-05-27 MED ORDER — POLYETHYLENE GLYCOL 3350 17 G PO PACK: 17.0000 g | PACK | Freq: Every day | ORAL | 0 refills | Status: DC | PRN

## 2023-05-27 MED ORDER — MELATONIN 5 MG PO TABS: 5.0000 mg | ORAL_TABLET | Freq: Every day | ORAL | 0 refills | Status: DC

## 2023-05-27 MED ORDER — INSULIN ASPART 100 UNIT/ML IJ SOLN: 0.0000 [IU] | Freq: Three times a day (TID) | INTRAMUSCULAR | 11 refills | Status: DC

## 2023-05-27 MED ORDER — TORSEMIDE 20 MG PO TABS: 20.0000 mg | ORAL_TABLET | Freq: Every day | ORAL | 0 refills | Status: DC

## 2023-05-27 MED ORDER — INSULIN ASPART 100 UNIT/ML IJ SOLN
0.0000 [IU] | Freq: Three times a day (TID) | INTRAMUSCULAR | Status: DC
  Administered 2023-05-27: 7 [IU] via SUBCUTANEOUS
  Administered 2023-05-27 (×2): 4 [IU] via SUBCUTANEOUS
  Filled 2023-05-27 (×3): qty 1

## 2023-05-27 MED ORDER — ADULT MULTIVITAMIN W/MINERALS CH: 1.0000 | ORAL_TABLET | Freq: Every day | ORAL | Status: DC

## 2023-05-27 MED ORDER — PREDNISONE 10 MG PO TABS: ORAL_TABLET | ORAL | 0 refills | Status: AC

## 2023-05-27 MED ORDER — AMIODARONE HCL 400 MG PO TABS: 400.0000 mg | ORAL_TABLET | Freq: Two times a day (BID) | ORAL | 0 refills | Status: DC

## 2023-05-27 MED ORDER — INSULIN ASPART 100 UNIT/ML IJ SOLN
25.0000 [IU] | Freq: Once | INTRAMUSCULAR | Status: AC
  Administered 2023-05-27: 25 [IU] via SUBCUTANEOUS
  Filled 2023-05-27: qty 1

## 2023-05-27 MED ORDER — LIDOCAINE VISCOUS HCL 2 % MT SOLN: 15.0000 mL | OROMUCOSAL | 0 refills | Status: DC | PRN

## 2023-05-27 MED ORDER — PANTOPRAZOLE SODIUM 40 MG PO TBEC: 40.0000 mg | DELAYED_RELEASE_TABLET | Freq: Every day | ORAL | 0 refills | Status: DC

## 2023-05-27 MED ORDER — IPRATROPIUM-ALBUTEROL 0.5-2.5 (3) MG/3ML IN SOLN: 3.0000 mL | RESPIRATORY_TRACT | 0 refills | Status: DC | PRN

## 2023-05-27 MED ORDER — ENSURE ENLIVE PO LIQD: 237.0000 mL | Freq: Three times a day (TID) | ORAL | 12 refills | Status: DC

## 2023-05-27 MED ORDER — METOPROLOL SUCCINATE ER 25 MG PO TB24: 25.0000 mg | ORAL_TABLET | Freq: Every day | ORAL | 0 refills | Status: DC

## 2023-05-27 MED ORDER — AMIODARONE HCL 200 MG PO TABS: 200.0000 mg | ORAL_TABLET | Freq: Two times a day (BID) | ORAL | 0 refills | Status: DC

## 2023-05-27 MED ORDER — FLUTICASONE FUROATE-VILANTEROL 100-25 MCG/ACT IN AEPB: 1.0000 | INHALATION_SPRAY | Freq: Every day | RESPIRATORY_TRACT | 0 refills | Status: DC

## 2023-05-27 MED ORDER — MENTHOL 3 MG MT LOZG: 1.0000 | LOZENGE | OROMUCOSAL | 12 refills | Status: DC | PRN

## 2023-05-27 MED ORDER — INSULIN ASPART 100 UNIT/ML IJ SOLN: 4.0000 [IU] | Freq: Three times a day (TID) | INTRAMUSCULAR | 0 refills | Status: DC

## 2023-05-27 MED ORDER — DOXYCYCLINE HYCLATE 100 MG PO TABS: 100.0000 mg | ORAL_TABLET | Freq: Two times a day (BID) | ORAL | 0 refills | Status: AC

## 2023-05-27 MED ORDER — SENNOSIDES-DOCUSATE SODIUM 8.6-50 MG PO TABS: 1.0000 | ORAL_TABLET | Freq: Every evening | ORAL | 0 refills | Status: DC | PRN

## 2023-05-27 NOTE — Progress Notes (Signed)
       CROSS COVER NOTE  NAME: Divisha Antalek MRN: [id number] DOB : May 13, 1956    Concern as stated by nurse / staff    Just letting you know that pt's CBG at bedtime was 448 mg/dL. Due Semglee 40 units SQ given. Pt said she has been eating a lot today. She has as history of PE, DVT, aortic stenosis, HTN, chronic bronchitis, OSA, AKI, dermatomyositis, DM2, dyslipidemia. Latest VS are stable. Pt had no complaints as of the moment. Thank you!     Pertinent findings on chart review: Sent to ER from skilled nursing facility due to abnormal labs. Admitted with hypercapnic resp failure, acute on chronic heart failure and underwent cardioversion for afib rvr, now on oral amio and torsemide Not on current sliding scale insulin   Assessment and  Interventions   Assessment:  Plan: AC HS resistant scale insulin Give 20 units nolog SQ now and repeat cbg in 4 hours       Donnie Mesa NP [facility] Cross Cover 7pm-7am - check amion for availability Pager [phone]

## 2023-05-27 NOTE — Progress Notes (Addendum)
Speech Language Pathology Treatment: Dysphagia  Patient Details Name: Sonya Sanchez MRN: [id number] DOB: [birth year] Today's Date: 05/27/2023 Time: 7846-9629 SLP Time Calculation (min) (ACUTE ONLY): 40 min  Assessment / Plan / Recommendation Clinical Impression  Pt seen for ongoing assessment of swallowing this morning. Pt awake/alert x4; vocal quality WFL - pt stated she felt back to "normal" now. Held Cup to feed self. On [place] O2 support 2L. Afebrile, WBC.  No further consistent coughing nor expectoration of phlegm perpt/NSG.  Pt c/o min soreness of left neck post procedure on Wed- electrocardioversion done.   Pt appears much improved and is eager to try "different" foods now -- she continues to c/o "No Taste" and "little interest" in eating many foods -- she feels things make her "gag" when she tries to eat them at times. Encouraged her to continue to try eating foods; different foods/textures. She has remained on a more minced diet of foods per her request for easier chewing. Per NSG, pt has been drinking and swallowing Pills w/out increased difficulty.  Pt appears at decreased risk for aspiration at this time following general aspiration precautions.    Pt has challenging factors that could impact her oropharyngeal swallowing to include fatigue/weakness/deconditioning, declined Pulmonary status d/t illness, and more sedentary in bed. These factors can increase risk for aspiration, dysphagia as well as decreased oral intake overall.    During po trials, pt consumed trials of thins liquids via Straw w/ no overt, clinical s/s of aspiration noted: no coughing, decline in vocal quality, nor change in respiratory presentation during/post trials. Oral phase appeared [facility] w/ timely bolus management and control of bolus propulsion for A-P transfer for swallowing. Oral clearing achieved w/ trials. Pt declined any difficulty w/ the minced foods but was eager to try more solid foods now -- agreed she would be  able to manage mastication of increased textured foods.    Recommend upgrade of diet to Regular diet w/ thin liquids; Cut foods and gravies/condiments to moisten. General aspiration precautions. Rest Breaks as needed during po intake. Support w/ sitting up for meals -- Chair Best for eating meals. Pills WHOLE in Puree IF desired for safer, easier swallowing.  Education given on Pills in Puree; general aspiration precautions/swallowing; food and liquid consistencies to pt. MD updated. Recommend Dietician f/u for support.  ST services can be available if any further swallowing needs while admitted. Pt agreed. MD updated.        HPI HPI: Pt is a 67 y.o. female who resides at area SNF with a hx of CAD, DM, DVT, acute renal failure, and morbid Obesity w/ worsening CHF and seen by Cardiology at admit the Hospital.  Per MD notes, pt has a long medical history of multiple issues who was admitted 2 weeks ago and underwent left heart cath by Dr. Okey Dupre. 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 She also has mod AS. Her LVEDP was elevated at 20. The patient was discharged home. She has returned to the hospital/ED. She was on bipap initially. Her CO2 was 60 6 hours ago. She is hypotensive, sleepy/somnolent, and has acute kidney injury and hyperkalemia per bloodwork. Post admit, pt required placement of an artificial airway secondary to Respiratory Failure. She was extubated on 05/18/23 s/p ~5 days of intubation.  CXR today: Endotracheal tube has been removed. Left lung is clear.  Significantly decreased right basilar opacity is  noted suggesting  improving pneumonia or atelectasis.  Prior CXR in 03/2023: Enlarged heart with vascular congestion and trace edema. Small left effusion.      SLP Plan  All goals met      Recommendations for follow up therapy are one component of a multi-disciplinary discharge planning  process, led by the attending physician.  Recommendations may be updated based on patient status, additional functional criteria and insurance authorization.    Recommendations  Diet recommendations: Regular;Thin liquid Liquids provided via: Cup;Straw Medication Administration: Whole meds with liquid (vs in Puree if needed for ease) Supervision: Patient able to self feed (setup) Compensations: Minimize environmental distractions;Slow rate;Small sips/bites Postural Changes and/or Swallow Maneuvers: Out of bed for meals;Seated upright 90 degrees;Upright 30-60 min after meal                 (Dietician consult) Oral care BID;Patient independent with oral care   PRN Dysphagia, unspecified (R13.10)     All goals met     Sonya Sanchez  05/27/2023, 1:36 PM

## 2023-05-27 NOTE — TOC Transition Note (Addendum)
Transition of Care [facility]) - CM/SW Discharge Note   Patient Details  Name: Sonya Sanchez MRN: [id number] Date of Birth: Apr 21, 1956  [facility] CM/SW Contact:  Darleene Cleaver, LCSW Phone Number: 05/27/2023, 2:01 PM   Clinical Narrative:     Patient to be d/c'ed today to [facility] room 19A.  Patient and family agreeable to plans will transport via ems RN to call report top [phone].    CSW contacted [facility] at 2:00pm there are 6 people ahead of her.      Barriers to Discharge: Continued Medical Work up   Patient Goals and CMS Choice CMS Medicare.gov Compare Post Acute Care list provided to:: Patient Represenative (must comment) Choice offered to / list presented to : Sibling  Discharge Placement     Existing PASRR number confirmed : 05/27/23          Patient chooses bed at: [facility] Patient to be transferred to facility by: [facility] Name of family member notified: Sister Hilda Lias Patient and family notified of of transfer: 05/27/23  Discharge Plan and Services Additional resources added to the After Visit Summary for                                       Social Determinants of Health (SDOH) Interventions SDOH Screenings   Food Insecurity: No Food Insecurity (05/18/2023)  Housing: Low Risk  (05/18/2023)  Transportation Needs: No Transportation Needs (05/18/2023)  Utilities: Not At Risk (05/18/2023)  Tobacco Use: Medium Risk (05/25/2023)     Readmission Risk Interventions    05/18/2023    1:32 PM  Readmission Risk Prevention Plan  Transportation Screening Complete  Medication Review (RN Care Manager) Complete  PCP or Specialist appointment within 3-5 days of discharge Complete  SW Recovery Care/Counseling Consult Complete  Palliative Care Screening Not Applicable  Skilled Nursing Facility Complete

## 2023-05-27 NOTE — Care Management Important Message (Signed)
Important Message  Patient Details  Name: Sonya Sanchez MRN: [id number] Date of Birth: [birth year]   Medicare Important Message Given:  Yes  Reviewed Medicare IM with patient via room phone [phone]).  Copy of Medicare IM to be delivered to patient via nursing staff.   Johnell Comings 05/27/2023, 10:20 AM

## 2023-05-27 NOTE — Progress Notes (Signed)
Speech Language Pathology Treatment: Dysphagia  Patient Details Name: Sonya Sanchez MRN: [id number] DOB: Nov 01, 1956 Today's Date: 05/27/2023 Time: 6213-0865 SLP Time Calculation (min) (ACUTE ONLY): 35 min  Assessment / Plan / Recommendation Clinical Impression  Sonya Sanchez seen for ongoing assessment of swallowing today. Sonya Sanchez awake/alert x4; vocal quality much improved w/ less hoarseness and increased volume - Sonya Sanchez agreed. Held Cup to feed self.  On [place] O2 support 2L.  Afebrile, WBC.  No further consistent coughing nor expectoration of phlegm perpt/NSG.    Sonya Sanchez appears much improved and is tolerating her current oral diet but not eager to try "different" foods today -- she stated she wanted to continue w/ "easy to eat/chew" foods and also c/o "No Taste" and "little interest" in eating many foods -- she feels things make her "gag" when she tries to eat them at times. Encouraged her to continue to try eating a little of her foods each tray. She remains on a more minced diet of foods per her request for easier chewing. Per NSG, Sonya Sanchez has been drinking and swallowing Pills w/out increased difficulty.  Sonya Sanchez appears at decreased risk for aspiration at this time following general aspiration precautions.    Sonya Sanchez has challenging factors that could impact her oropharyngeal swallowing to include fatigue/weakness/deconditioning, declined Pulmonary status d/t illness, recent oral intubation, and more sedentary in bed. These factors can increase risk for aspiration, dysphagia as well as decreased oral intake overall.    During po trials, Sonya Sanchez consumed trials of thins liquids via Straw and minced food/moistened in puree w/ no overt, clinical s/s of aspiration noted: no coughing, decline in vocal quality, nor change in respiratory presentation during/post trials. Oral phase appeared [facility] w/ timely bolus management, mastication, and control of bolus propulsion for A-P transfer for swallowing. Oral clearing achieved w/ trials. Sonya Sanchez declined any  difficulty w/ the minced foods but not "very interested right now".    Recommend current Minced foods diet w/ thin liquids; gravies/condiments to moisten and flavor. General aspiration precautions. Rest Breaks as needed during po intake. Support w/ sitting up for meals -- Chair Best for eating meals. Pills WHOLE in Puree IF desired for safer, easier swallowing.  Education given on Pills in Puree; general aspiration precautions/swallowing; food and liquid consistencies to Sonya Sanchez. MD updated. Recommend Dietician f/u for support.  ST services will f/u next 1-3 days when Sonya Sanchez is ready for upgrade of diet. Sonya Sanchez agreed. MD/NSG updated.      HPI HPI: Sonya Sanchez is a 67 y.o. female who resides at area SNF with a hx of CAD, DM, DVT, acute renal failure, and morbid Obesity w/ worsening CHF and seen by Cardiology at admit the Hospital.  Per MD notes, Sonya Sanchez has a long medical history of multiple issues who was admitted 2 weeks ago and underwent left heart cath by Dr. Okey Dupre. 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 She also has mod AS. Her LVEDP was elevated at 20. The patient was discharged home. She has returned to the hospital/ED. She was on bipap initially. Her CO2 was 60 6 hours ago. She is hypotensive, sleepy/somnolent, and has acute kidney injury and hyperkalemia per bloodwork. Post admit, Sonya Sanchez required placement of an artificial airway secondary to Respiratory Failure. She was extubated on 05/18/23 s/p ~5 days of intubation.  CXR today: Endotracheal tube has been removed. Left lung is clear.  Significantly decreased right basilar opacity is  noted suggesting  improving pneumonia or atelectasis.  Prior CXR in 03/2023: Enlarged heart with vascular congestion and trace edema. Small left effusion.      SLP Plan  Continue with current plan of care      Recommendations for follow up therapy are one component of a multi-disciplinary  discharge planning process, led by the attending physician.  Recommendations may be updated based on patient status, additional functional criteria and insurance authorization.    Recommendations  Diet recommendations: Dysphagia 2 (fine chop);Thin liquid (Sonya Sanchez's request) Liquids provided via: Cup;Straw Medication Administration: Whole meds with puree Supervision: Patient able to self feed (setup and support) Compensations: Minimize environmental distractions;Slow rate;Small sips/bites Postural Changes and/or Swallow Maneuvers: Out of bed for meals;Seated upright 90 degrees;Upright 30-60 min after meal                 (Dietician) Oral care BID;Patient independent with oral care   Set up Supervision/Assistance Dysphagia, unspecified (R13.10)     Continue with current plan of care        Jerilynn Som, MS, CCC-SLP Speech Language Pathologist Rehab Services; [facility] [phone] (ascom) Kionna Brier 05/27/2023, 5:45 PM  (late entry)

## 2023-05-27 NOTE — Discharge Summary (Addendum)
Physician Discharge Summary   Patient: Sonya Sanchez MRN: [id number]  DOB: 1955/12/07   Admit:     Date of Admission: 05/13/2023 Admitted from: SNF   Discharge: Date of discharge: 05/27/23 Disposition: SNF long term resident Condition at discharge: good  CODE STATUS: FULL CODE      Discharge Physician: Sunnie Nielsen, DO [facility]     PCP: Pcp, No  Recommendations for Outpatient Follow-up:  Follow up with PCP Pcp, No in 1-2 weeks Please obtain labs/tests: CBC, BMP, consider EKG in 1-2 weeks Please follow up on the following pending results: none PCP AND OTHER OUTPATIENT PROVIDERS: SEE BELOW FOR SPECIFIC DISCHARGE INSTRUCTIONS PRINTED FOR PATIENT IN ADDITION TO GENERIC AVS PATIENT INFO  Follow w/ cardiology as directed  Carb modified cardiac diet Activity as tolerated     Discharge Diagnoses: Principal Problem:   AKI (acute kidney injury) (HCC) Active Problems:   Acute on chronic respiratory failure with hypoxemia (HCC)   Altered mental status   Acute on chronic heart failure with preserved ejection fraction (HFpEF) (HCC)   Essential hypertension   Pulmonary embolism (HCC)   Non-ST elevation (NSTEMI) myocardial infarction (HCC)   OSA (obstructive sleep apnea)   Coronary artery disease due to lipid rich plaque   Hyperkalemia   Hyponatremia   Anemia   Pneumonia due to infectious organism   Chronic diastolic CHF (congestive heart failure) (HCC)   Atrial fibrillation with RVR (HCC)   Mitral valve stenosis       Hospital Course: 67 yo F with obesity, from nursing home. She has as history of PE, DVT, aortic stenosis, HTN, chronic bronchitis, OSA, AKI, dermatomyositis, DM2, dyslipidemia, came in due to worsening renal function noted on bloodwork.  She was noted to be lethargic, denied pain anywhere. She was found to have severe hypercapnic acidemia.  She was placed on BIPAP.  PCCM was consulted to due AMS lethargy on BIPAP.    Significant hospital  events and procedures 05/15/23 central line placed, nephrology initiated dialysis, cardiology consult 05/16/2023-Lasix drip increased, metolazone added 6/18-started antibiotics due to increasing WBC count, pancultures sent 6/19 extubated.  Had respiratory distress and was put on BiPAP.  Started amnio for rapid A-fib. 6/20: lasix drip stopped. 6/21: rested off BPAP. Phenyl drip. Nephro s/o, d/c dialysis  6/22: started on midodrine. 6/24 started torsemide 20 mg p.o. daily as per cardiology 6/25 started spironolactone 25 mg p.o. daily 6/26: amiodarone drip following electrocardioversion for Afib 6/27: po amiodarone, titrating meds  6/28: stable for discharge   Consultants:  Palliative care  Cardiology  Nephrology  PCCM  Procedures: Cardioversion  Intubation/extubation Central line placement and hemodialysis       ASSESSMENT & PLAN:  # Acute on chronic diastolic heart failure - acute illness resolved  Echo (6/24) with EF 60-655, mild LVH, normal RV, mild mitral stenosis mean gradient 7, moderate aortic stenosis with AVA 1.08 cm^2 mean gradient 10, IVC dilated.  Continue torsemide 20 mg daily.  Hold off on Jardiance for now with recent E coli UTI.  Continue spironolactone 25 mg daily.  Given elevated blood pressure, resumed Toprol 25 mg twice daily.   # Atrial fibrillation anticoagulation with Eliquis Cardiology following, status post electrocardioversion done on 6/26 Amiodarone po  # AKI secondary to IV contrast, ATN s/p dialysis # Hyperkalemia-resolved Creatinine 1.36, improving Monitor renal functions and urine output     # Sepsis, severe  Possible sources are hospital-acquired pneumonia versus e coli urosepsis - resolved Right lower lobe  infiltrate on chest x-ray MRSA positive- s/p vancomycin DC'd on 6/24 after a.m. dose and started doxycycline 100 mg p.o. twice daily for 7 days, Continue zosyn as E coli in urine is resistant to cefepime.  S/p Neo-Synephrine.  Procalcitonin is elevated.   I suspect that some of this is diastolic dysfunction; starting midodrine (6/22) to help with BP support S/p midodrine, which was discontinued, blood pressure improved.   6/25  repeat blood cultures NGTD   # Acute on chronic hypoxic, hypercarbic respiratory failure secondary to CHF now at baseline  History of OSA, morbid obesity Required BiPAP postextubation. transition from BiPAP to high flow nasal cannula  Continue BiPAP at night and during sleep for history of severe OSA Continue supplemental O2 nation and gradually wean off    # Dermatomyositis Monitor CK Off azathioprine due to concern for sepsis.   # H/O DVT, PE Continue Eliquis   # Diabetes mellitus type 2  SSI coverage Hyperglycemia remains a  challenge Increased Lantus 30u BID   # Morbid obesity Body mass index is 45.79 kg/m.  Nutrition Problem: Inadequate oral intake Etiology: inability to eat (pt sedated and ventilated) Interventions: Interventions: Ensure Enlive (each supplement provides 350kcal and 20 grams of protein), MVI, Magic cup   # dvanced care planning "she does not want to consider or discuss care moving forward, and states she will make decisions as needed at the time.  She states that if she is unable to make decisions herself, her sister will be able to.  She is amenable to outpatient palliative to follow. " See palliative care note 05/26/23           Discharge Instructions  Allergies as of 05/27/2023       Reactions   Sulfa Antibiotics Other (See Comments)   Reaction: isn't certain, thinks she ran a fever, or had a rash, maybe both.        Medication List     STOP taking these medications    ciprofloxacin 500 MG tablet Commonly known as: CIPRO   empagliflozin 10 MG Tabs tablet Commonly known as: JARDIANCE   furosemide 40 MG tablet Commonly known as: LASIX   insulin lispro 100 UNIT/ML injection Commonly known as: HUMALOG   irbesartan 300 MG  tablet Commonly known as: AVAPRO   losartan 100 MG tablet Commonly known as: COZAAR   omeprazole 20 MG capsule Commonly known as: PRILOSEC   sodium chloride 1 g tablet   sodium polystyrene 15 GM/60ML suspension Commonly known as: KAYEXALATE       TAKE these medications    acetaminophen 325 MG tablet Commonly known as: TYLENOL Take 650 mg by mouth every 4 (four) hours as needed for mild pain, fever or headache.   amiodarone 400 MG tablet Commonly known as: PACERONE Take 1 tablet (400 mg total) by mouth 2 (two) times daily for 5 days.   amiodarone 200 MG tablet Commonly known as: PACERONE Take 1 tablet (200 mg total) by mouth 2 (two) times daily. Start taking on: June 02, 2023   atorvastatin 40 MG tablet Commonly known as: LIPITOR Take 40 mg by mouth at bedtime.   azathioprine 100 MG tablet Commonly known as: IMURAN Take 100 mg by mouth 2 (two) times daily.   Basaglar KwikPen 100 UNIT/ML Inject 40 Units into the skin at bedtime.   Calcium 600+D 600-10 MG-MCG Tabs Generic drug: Calcium Carb-Cholecalciferol Take 1 tablet by mouth daily.   doxycycline 100 MG tablet Commonly known as: VIBRA-TABS Take 1  tablet (100 mg total) by mouth every 12 (twelve) hours for 3 days.   Eliquis 5 MG Tabs tablet Generic drug: apixaban TAKE 1 TABLET(5 MG) BY MOUTH TWICE DAILY   feeding supplement Liqd Take 237 mLs by mouth 3 (three) times daily between meals.   fluticasone 50 MCG/ACT nasal spray Commonly known as: FLONASE Place 2 sprays into both nostrils daily as needed for allergies or rhinitis.   fluticasone furoate-vilanterol 100-25 MCG/ACT Aepb Commonly known as: BREO ELLIPTA Inhale 1 puff into the lungs daily. Start taking on: May 28, 2023   Fluticasone-Umeclidin-Vilant 100-62.5-25 MCG/ACT Aepb Inhale 1 puff into the lungs daily.   insulin aspart 100 UNIT/ML injection Commonly known as: novoLOG Inject 0-20 Units into the skin 4 (four) times daily -  before meals  and at bedtime. What changed: You were already taking a medication with the same name, and this prescription was added. Make sure you understand how and when to take each.   insulin aspart 100 UNIT/ML injection Commonly known as: novoLOG Inject 4-24 Units into the skin 3 (three) times daily with meals. Based on sliding scale - CBG 70 - 120: 4 units CBG 121 - 150: 7 units CBG 151 - 200: 8 units CBG 201 - 250: 11 units CBG 251 - 300: 15 units CBG 301 - 350: 19 units CBG 351 - 400: 24 units What changed:  how much to take additional instructions   ipratropium-albuterol 0.5-2.5 (3) MG/3ML Soln Commonly known as: DUONEB Take 3 mLs by nebulization every 4 (four) hours as needed.   lidocaine 2 % solution Commonly known as: XYLOCAINE Use as directed 15 mLs in the mouth or throat every 3 (three) hours as needed for mouth pain.   loratadine 10 MG tablet Commonly known as: CLARITIN Take 1 tablet (10 mg total) by mouth daily as needed for allergies.   melatonin 5 MG Tabs Take 1 tablet (5 mg total) by mouth at bedtime.   menthol-cetylpyridinium 3 MG lozenge Commonly known as: CEPACOL Take 1 lozenge (3 mg total) by mouth as needed for sore throat.   metoprolol succinate 25 MG 24 hr tablet Commonly known as: TOPROL-XL Take 1 tablet (25 mg total) by mouth daily. Start taking on: May 28, 2023   multivitamin with minerals Tabs tablet Take 1 tablet by mouth daily.   Ozempic (2 MG/DOSE) 8 MG/3ML Sopn Generic drug: Semaglutide (2 MG/DOSE) Inject 0.25 mg once weekly for 4 week and increase to 0.5 mg weekly and follow up with PCP.   pantoprazole 40 MG tablet Commonly known as: PROTONIX Take 1 tablet (40 mg total) by mouth daily. Start taking on: May 28, 2023   polyethylene glycol 17 g packet Commonly known as: MIRALAX / GLYCOLAX Take 17 g by mouth daily as needed for mild constipation.   predniSONE 10 MG tablet Commonly known as: DELTASONE Take 3 tablets (30 mg total) by mouth daily  with breakfast for 3 days, THEN 2 tablets (20 mg total) daily with breakfast for 3 days, THEN 1 tablet (10 mg total) daily with breakfast for 3 days. Start taking on: May 27, 2023   pregabalin 200 MG capsule Commonly known as: LYRICA Take 200 mg by mouth 3 (three) times daily.   senna-docusate 8.6-50 MG tablet Commonly known as: Senokot-S Take 1 tablet by mouth at bedtime as needed for moderate constipation.   spironolactone 25 MG tablet Commonly known as: ALDACTONE Take 25 mg by mouth daily.   torsemide 20 MG tablet Commonly known as:  DEMADEX Take 1 tablet (20 mg total) by mouth daily. Start taking on: May 28, 2023   Vitamin D 1000 units capsule Take 2,000 Units by mouth daily.         Contact information for after-discharge care     Destination     [facility] Preferred SNF .   Service: Skilled Nursing Contact information: [address] [place] [address] [phone]                     Allergies  Allergen Reactions   Sulfa Antibiotics Other (See Comments)    Reaction: isn't certain, thinks she ran a fever, or had a rash, maybe both.     Subjective: pt reports continued sore throat but this is improving, no CP/SOB, no other concerns today    Discharge Exam: BP (!) 175/55 (BP Location: Right Arm)   Pulse 61   Temp 98.4 F (36.9 C)   Resp 18   Ht 5' 2.01" (1.575 m)   Wt 119.7 kg   SpO2 98%   BMI 48.25 kg/m  General: Pt is alert, awake, not in acute distress Cardiovascular: RRR, S1/S2 +murmur, no rubs, no gallops Respiratory: CTA bilaterally, no wheezing, no rhonchi Abdominal: Soft, NT, ND Extremities: trace edema, no cyanosis     The results of significant diagnostics from this hospitalization (including imaging, microbiology, ancillary and laboratory) are listed below for reference.     Microbiology: Recent Results (from the past 240 hour(s))  Culture, blood (Routine X 2) w Reflex to ID Panel      Status: None (Preliminary result)   Collection Time: 05/24/23  6:24 AM   Specimen: BLOOD  Result Value Ref Range Status   Specimen Description BLOOD BLOOD RIGHT HAND  Final   Special Requests   Final    BOTTLES DRAWN AEROBIC AND ANAEROBIC Blood Culture adequate volume   Culture   Final    NO GROWTH 3 DAYS Performed at [facility], [address] [place], [place] [address]    Report Status PENDING  Incomplete  Culture, blood (Routine X 2) w Reflex to ID Panel     Status: None (Preliminary result)   Collection Time: 05/24/23  9:38 AM   Specimen: BLOOD RIGHT HAND  Result Value Ref Range Status   Specimen Description BLOOD RIGHT HAND  Final   Special Requests   Final    BOTTLES DRAWN AEROBIC AND ANAEROBIC Blood Culture adequate volume   Culture   Final    NO GROWTH 3 DAYS Performed at [facility], [address] [place], [place] [address]    Report Status PENDING  Incomplete     Labs: BNP (last 3 results) Recent Labs    04/29/23 1507  BNP 386.8*   Basic Metabolic Panel: Recent Labs  Lab 05/22/23 0352 05/23/23 0442 05/24/23 0533 05/25/23 0430 05/26/23 0427 05/26/23 2330 05/27/23 0644  NA 142 142 140 142 141  --  142  K 3.8 4.0 4.0 3.6 4.3  --  4.3  CL 102 104 101 101 102  --  101  CO2 27 27 27 30 31   --  30  GLUCOSE 261* 213* 188* 152* 229* 494* 193*  BUN 94* 85* 80* 74* 71*  --  70*  CREATININE 1.60* 1.53* 1.41* 1.36* 1.37*  --  1.24*  CALCIUM 8.3* 8.4* 8.7* 8.7* 8.6*  --  8.8*  MG 1.8 1.9 1.8 2.0 1.8  --   --   PHOS  4.3 3.5 2.9 2.6 3.6  --   --    Liver Function Tests: Recent Labs  Lab 05/21/23 0320 05/22/23 0352 05/23/23 0442  ALBUMIN 2.6* 2.5* 2.4*   No results for input(s): "LIPASE", "AMYLASE" in the last 168 hours. No results for input(s): "AMMONIA" in the last 168 hours. CBC: Recent Labs  Lab 05/23/23 0442 05/24/23 0533 05/25/23 0430 05/26/23 0427 05/27/23 0644  WBC 6.8 7.7 8.6 8.6 9.3  HGB 8.6* 9.4* 9.4* 9.1* 10.0*   HCT 27.1* 29.1* 29.4* 29.4* 30.1*  MCV 90.9 90.1 92.2 93.3 89.1  PLT 157 184 173 180 170   Cardiac Enzymes: No results for input(s): "CKTOTAL", "CKMB", "CKMBINDEX", "TROPONINI" in the last 168 hours. BNP: Invalid input(s): "POCBNP" CBG: Recent Labs  Lab 05/26/23 2109 05/26/23 2204 05/27/23 0411 05/27/23 0741 05/27/23 1225  GLUCAP 420* 448* 226* 170* 160*   D-Dimer No results for input(s): "DDIMER" in the last 72 hours. Hgb A1c No results for input(s): "HGBA1C" in the last 72 hours. Lipid Profile No results for input(s): "CHOL", "HDL", "LDLCALC", "TRIG", "CHOLHDL", "LDLDIRECT" in the last 72 hours. Thyroid function studies No results for input(s): "TSH", "T4TOTAL", "T3FREE", "THYROIDAB" in the last 72 hours.  Invalid input(s): "FREET3" Anemia work up No results for input(s): "VITAMINB12", "FOLATE", "FERRITIN", "TIBC", "IRON", "RETICCTPCT" in the last 72 hours. Urinalysis    Component Value Date/Time   COLORURINE YELLOW (A) 05/17/2023 0754   APPEARANCEUR HAZY (A) 05/17/2023 0754   LABSPEC 1.009 05/17/2023 0754   PHURINE 5.0 05/17/2023 0754   GLUCOSEU 150 (A) 05/17/2023 0754   HGBUR MODERATE (A) 05/17/2023 0754   BILIRUBINUR NEGATIVE 05/17/2023 0754   BILIRUBINUR small (A) 12/08/2016 0858   KETONESUR NEGATIVE 05/17/2023 0754   PROTEINUR 30 (A) 05/17/2023 0754   UROBILINOGEN 0.2 12/08/2016 0858   UROBILINOGEN 0.2 07/25/2009 1159   NITRITE NEGATIVE 05/17/2023 0754   LEUKOCYTESUR TRACE (A) 05/17/2023 0754   Sepsis Labs Recent Labs  Lab 05/24/23 0533 05/25/23 0430 05/26/23 0427 05/27/23 0644  WBC 7.7 8.6 8.6 9.3   Microbiology Recent Results (from the past 240 hour(s))  Culture, blood (Routine X 2) w Reflex to ID Panel     Status: None (Preliminary result)   Collection Time: 05/24/23  6:24 AM   Specimen: BLOOD  Result Value Ref Range Status   Specimen Description BLOOD BLOOD RIGHT HAND  Final   Special Requests   Final    BOTTLES DRAWN AEROBIC AND ANAEROBIC  Blood Culture adequate volume   Culture   Final    NO GROWTH 3 DAYS Performed at [facility], [address] [place], [place] [address]    Report Status PENDING  Incomplete  Culture, blood (Routine X 2) w Reflex to ID Panel     Status: None (Preliminary result)   Collection Time: 05/24/23  9:38 AM   Specimen: BLOOD RIGHT HAND  Result Value Ref Range Status   Specimen Description BLOOD RIGHT HAND  Final   Special Requests   Final    BOTTLES DRAWN AEROBIC AND ANAEROBIC Blood Culture adequate volume   Culture   Final    NO GROWTH 3 DAYS Performed at [facility], [address] [place], [place] [address]    Report Status PENDING  Incomplete   Imaging ECHO TEE  Result Date: 05/25/2023    TRANSESOPHOGEAL ECHO REPORT   Patient Name:   Sonya Sanchez Date of Exam: 05/25/2023 Medical Rec #:  [id number]   Height:       62.0  in Accession #:    [id number]  Weight:       250.4 lb Date of Birth:  1956-11-13   BSA:          2.103 m Patient Age:    10 years    BP:           141/67 mmHg Patient Gender: F           HR:           72 bpm. Exam Location:  [facility] Procedure: Transesophageal Echo, Cardiac Doppler and Color Doppler Indications:     Not listed on TEE check-in sheet  History:         Patient has prior history of Echocardiogram examinations, most                  recent 05/01/2023. Risk Factors:Diabetes and Hypertension.  Sonographer:     Cristela Blue Referring Phys:  [id number] CADENCE H FURTH Diagnosing Phys: Freida Busman McleanMD PROCEDURE: The transesophogeal probe was passed without difficulty through the esophogus of the patient. Sedation performed by different physician. The patient developed no complications during the procedure.  IMPRESSIONS  1. Left ventricular ejection fraction, by estimation, is 55 to 60%. The left ventricle has normal function. The left ventricle has no regional wall motion abnormalities. There is moderate concentric left ventricular hypertrophy.  2. Peak RV-RA gradient  30 mmHg. Right ventricular systolic function is mildly reduced. The right ventricular size is normal.  3. Left atrial size was moderately dilated. No left atrial/left atrial appendage thrombus was detected.  4. No PFO/ASD by color doppler.  5. The mitral valve is abnormal. Mild to moderate mitral valve regurgitation. No evidence of mitral stenosis.  6. The aortic valve is tricuspid. There is severe calcification of the aortic valve. Aortic valve regurgitation is trivial. Suspect paradoxical low flow/low gradient severe aortic valve stenosis. Aortic valve area, by VTI measures 0.63 cm. Aortic valve  mean gradient measures 26.0 mmHg.  7. Normal caliber thoracic aorta with mild plaque. FINDINGS  Left Ventricle: Left ventricular ejection fraction, by estimation, is 55 to 60%. The left ventricle has normal function. The left ventricle has no regional wall motion abnormalities. The left ventricular internal cavity size was normal in size. There is  moderate concentric left ventricular hypertrophy. Right Ventricle: Peak RV-RA gradient 30 mmHg. The right ventricular size is normal. No increase in right ventricular wall thickness. Right ventricular systolic function is mildly reduced. Left Atrium: Left atrial size was moderately dilated. No left atrial/left atrial appendage thrombus was detected. Right Atrium: Right atrial size was normal in size. Pericardium: Trivial pericardial effusion is present. Mitral Valve: The mitral valve is abnormal. There is mild calcification of the mitral valve leaflet(s). Mild mitral annular calcification. Mild to moderate mitral valve regurgitation. No evidence of mitral valve stenosis. Tricuspid Valve: The tricuspid valve is normal in structure. Tricuspid valve regurgitation is trivial. Aortic Valve: The aortic valve is tricuspid. There is severe calcifcation of the aortic valve. Aortic valve regurgitation is trivial. Severe aortic stenosis is present. Aortic valve mean gradient measures  26.0 mmHg. Aortic valve peak gradient measures 37.9 mmHg. Aortic valve area, by VTI measures 0.63 cm. Pulmonic Valve: The pulmonic valve was normal in structure. Pulmonic valve regurgitation is not visualized. No evidence of pulmonic stenosis. Aorta: Normal caliber thoracic aorta with mild plaque. The aortic root is normal in size and structure. IAS/Shunts: No PFO/ASD by color doppler.  LEFT VENTRICLE PLAX 2D LVOT diam:  1.90 cm LV SV:         43 LV SV Index:   20 LVOT Area:     2.84 cm  AORTIC VALVE AV Area (Vmax):    0.72 cm AV Area (Vmean):   0.69 cm AV Area (VTI):     0.63 cm AV Vmax:           307.67 cm/s AV Vmean:          226.000 cm/s AV VTI:            0.681 m AV Peak Grad:      37.9 mmHg AV Mean Grad:      26.0 mmHg LVOT Vmax:         78.40 cm/s LVOT Vmean:        54.800 cm/s LVOT VTI:          0.151 m LVOT/AV VTI ratio: 0.22  SHUNTS Systemic VTI:  0.15 m Systemic Diam: 1.90 cm Dalton McleanMD Electronically signed by Wilfred Lacy Signature Date/Time: 05/25/2023/12:01:39 PM    Final       Time coordinating discharge: over 30 minutes  SIGNED:  Sunnie Nielsen DO [facility]

## 2023-05-27 NOTE — Progress Notes (Signed)
Rounding Note    Patient Name: Sonya Sanchez Date of Encounter: 05/27/2023  [facility] Cardiologist: Sonya Nordmann, MD   Subjective   Kidney function improving. The patient is overall feeling better. She is on 2 O2 . No chest pain. She remains in NSR.   Inpatient Medications    Scheduled Meds:  [START ON 06/02/2023] amiodarone  200 mg Oral BID   amiodarone  400 mg Oral BID   apixaban  5 mg Oral BID   atorvastatin  80 mg Oral Daily   Chlorhexidine Gluconate Cloth  6 each Topical Daily   doxycycline  100 mg Oral Q12H   feeding supplement  237 mL Oral TID BM   fluticasone furoate-vilanterol  1 puff Inhalation Daily   insulin aspart  0-20 Units Subcutaneous TID AC & HS   insulin glargine-yfgn  40 Units Subcutaneous QHS   melatonin  5 mg Oral QHS   metoprolol succinate  25 mg Oral Daily   multivitamin with minerals  1 tablet Oral Daily   mouth rinse  15 mL Mouth Rinse 4 times per day   pantoprazole  40 mg Oral Daily   polyethylene glycol  17 g Oral Daily   predniSONE  30 mg Oral Q breakfast   Followed by   [name] ON 05/30/2023] predniSONE  20 mg Oral Q breakfast   Followed by   [name] ON 06/02/2023] predniSONE  10 mg Oral Q breakfast   senna  1 tablet Oral BID   sodium chloride flush  3 mL Intravenous Q12H   spironolactone  25 mg Oral Daily   torsemide  20 mg Oral Daily   Continuous Infusions:  sodium chloride 20 mL/hr at 05/25/23 1300   PRN Meds: acetaminophen **OR** acetaminophen, ipratropium-albuterol, lidocaine, menthol-cetylpyridinium, mouth rinse, polyethylene glycol, senna-docusate   Vital Signs    Vitals:   05/26/23 1954 05/26/23 2346 05/27/23 0347 05/27/23 0738  BP: (!) 148/59 (!) 177/56 (!) 162/70 (!) 175/55  Pulse: 76 71 62 61  Resp: 19 18 18 18   Temp: 98.9 F (37.2 C) 98.4 F (36.9 C) 98.2 F (36.8 C) 98.4 F (36.9 C)  TempSrc:  Oral Oral   SpO2: 95% 97% 100% 98%  Weight:   119.7 kg   Height:        Intake/Output Summary (Last 24 hours)  at 05/27/2023 0831 Last data filed at 05/26/2023 1845 Gross per 24 hour  Intake 600 ml  Output 1925 ml  Net -1325 ml      05/27/2023    3:47 AM 05/25/2023    3:55 AM 05/24/2023    5:00 AM  Last 3 Weights  Weight (lbs) 263 lb 14.3 oz 250 lb 7.1 oz 250 lb 10.6 oz  Weight (kg) 119.7 kg 113.6 kg 113.7 kg      Telemetry    NSR HR 70s - Personally Reviewed  ECG    No new - Personally Reviewed  Physical Exam   GEN: No acute distress.   Neck: No JVD Cardiac: RRR, + murmurs=, no rubs, or gallops.  Respiratory: Clear to auscultation bilaterally. GI: Soft, nontender, non-distended  MS: No edema; No deformity. Neuro:  Nonfocal  Psych: Normal affect   Labs    High Sensitivity Troponin:   Recent Labs  Lab 04/30/23 1300 04/30/23 1437 05/01/23 0401 05/01/23 0908 05/01/23 1221  TROPONINIHS 1,678* 1,669* 958* 904* 859*     Chemistry Recent Labs  Lab 05/21/23 0320 05/21/23 0320 05/22/23 4403 05/23/23 4742 05/24/23 0533 05/25/23 0430  05/26/23 0427 05/26/23 2330 05/27/23 0644  NA 138  --  142 142 140 142 141  --  142  K 3.9  --  3.8 4.0 4.0 3.6 4.3  --  4.3  CL 101  --  102 104 101 101 102  --  101  CO2 27  --  27 27 27 30 31   --  30  GLUCOSE 185*  --  261* 213* 188* 152* 229* 494* 193*  BUN 93*  --  94* 85* 80* 74* 71*  --  70*  CREATININE 1.74*  --  1.60* 1.53* 1.41* 1.36* 1.37*  --  1.24*  CALCIUM 8.1*  --  8.3* 8.4* 8.7* 8.7* 8.6*  --  8.8*  MG  --    < > 1.8 1.9 1.8 2.0 1.8  --   --   ALBUMIN 2.6*  --  2.5* 2.4*  --   --   --   --   --   GFRNONAA 31*  --  35* 37* 40* 42* 42*  --  47*  ANIONGAP 10  --  13 11 12 11 8   --  11   < > = values in this interval not displayed.    Lipids No results for input(s): "CHOL", "TRIG", "HDL", "LABVLDL", "LDLCALC", "CHOLHDL" in the last 168 hours.  Hematology Recent Labs  Lab 05/25/23 0430 05/26/23 0427 05/27/23 0644  WBC 8.6 8.6 9.3  RBC 3.19* 3.15* 3.38*  HGB 9.4* 9.1* 10.0*  HCT 29.4* 29.4* 30.1*  MCV 92.2 93.3 89.1   MCH 29.5 28.9 29.6  MCHC 32.0 31.0 33.2  RDW 19.6* 19.8* 19.9*  PLT 173 180 170   Thyroid  Recent Labs  Lab 05/24/23 0533  TSH 3.918  FREET4 1.03    BNPNo results for input(s): "BNP", "PROBNP" in the last 168 hours.  DDimer No results for input(s): "DDIMER" in the last 168 hours.   Radiology    ECHO TEE  Result Date: 05/25/2023    TRANSESOPHOGEAL ECHO REPORT   Patient Name:   Sonya Sanchez Date of Exam: 05/25/2023 Medical Rec #:  [id number]   Height:       62.0 in Accession #:    [id number]  Weight:       250.4 lb Date of Birth:  [birth year]   BSA:          2.103 m Patient Age:    67 years    BP:           141/67 mmHg Patient Gender: F           HR:           72 bpm. Exam Location:  [facility] Procedure: Transesophageal Echo, Cardiac Doppler and Color Doppler Indications:     Not listed on TEE check-in sheet  History:         Patient has prior history of Echocardiogram examinations, most                  recent 05/01/2023. Risk Factors:Diabetes and Hypertension.  Sonographer:     Cristela Blue Referring Phys:  [id number] Sonya Sanchez H Kadin Canipe Diagnosing Phys: Sonya Busman McleanMD PROCEDURE: The transesophogeal probe was passed without difficulty through the esophogus of the patient. Sedation performed by different physician. The patient developed no complications during the procedure.  IMPRESSIONS  1. Left ventricular ejection fraction, by estimation, is 55 to 60%. The left ventricle has normal function. The left ventricle has no regional wall motion abnormalities. There is moderate concentric  left ventricular hypertrophy.  2. Peak RV-RA gradient 30 mmHg. Right ventricular systolic function is mildly reduced. The right ventricular size is normal.  3. Left atrial size was moderately dilated. No left atrial/left atrial appendage thrombus was detected.  4. No PFO/ASD by color doppler.  5. The mitral valve is abnormal. Mild to moderate mitral valve regurgitation. No evidence of mitral stenosis.  6. The aortic valve is tricuspid.  There is severe calcification of the aortic valve. Aortic valve regurgitation is trivial. Suspect paradoxical low flow/low gradient severe aortic valve stenosis. Aortic valve area, by VTI measures 0.63 cm. Aortic valve  mean gradient measures 26.0 mmHg.  7. Normal caliber thoracic aorta with mild plaque. FINDINGS  Left Ventricle: Left ventricular ejection fraction, by estimation, is 55 to 60%. The left ventricle has normal function. The left ventricle has no regional wall motion abnormalities. The left ventricular internal cavity size was normal in size. There is  moderate concentric left ventricular hypertrophy. Right Ventricle: Peak RV-RA gradient 30 mmHg. The right ventricular size is normal. No increase in right ventricular wall thickness. Right ventricular systolic function is mildly reduced. Left Atrium: Left atrial size was moderately dilated. No left atrial/left atrial appendage thrombus was detected. Right Atrium: Right atrial size was normal in size. Pericardium: Trivial pericardial effusion is present. Mitral Valve: The mitral valve is abnormal. There is mild calcification of the mitral valve leaflet(s). Mild mitral annular calcification. Mild to moderate mitral valve regurgitation. No evidence of mitral valve stenosis. Tricuspid Valve: The tricuspid valve is normal in structure. Tricuspid valve regurgitation is trivial. Aortic Valve: The aortic valve is tricuspid. There is severe calcifcation of the aortic valve. Aortic valve regurgitation is trivial. Severe aortic stenosis is present. Aortic valve mean gradient measures 26.0 mmHg. Aortic valve peak gradient measures 37.9 mmHg. Aortic valve area, by VTI measures 0.63 cm. Pulmonic Valve: The pulmonic valve was normal in structure. Pulmonic valve regurgitation is not visualized. No evidence of pulmonic stenosis. Aorta: Normal caliber thoracic aorta with mild plaque. The aortic root is normal in size and structure. IAS/Shunts: No PFO/ASD by color doppler.   LEFT VENTRICLE PLAX 2D LVOT diam:     1.90 cm LV SV:         43 LV SV Index:   20 LVOT Area:     2.84 cm  AORTIC VALVE AV Area (Vmax):    0.72 cm AV Area (Vmean):   0.69 cm AV Area (VTI):     0.63 cm AV Vmax:           307.67 cm/s AV Vmean:          226.000 cm/s AV VTI:            0.681 m AV Peak Grad:      37.9 mmHg AV Mean Grad:      26.0 mmHg LVOT Vmax:         78.40 cm/s LVOT Vmean:        54.800 cm/s LVOT VTI:          0.151 m LVOT/AV VTI ratio: 0.22  SHUNTS Systemic VTI:  0.15 m Systemic Diam: 1.90 cm Dalton McleanMD Electronically signed by Wilfred Lacy Signature Date/Time: 05/25/2023/12:01:39 PM    Final     Cardiac Studies   TEE 05/25/23 1. Left ventricular ejection fraction, by estimation, is 55 to 60%. The  left ventricle has normal function. The left ventricle has no regional  wall motion abnormalities. There is moderate concentric left ventricular  hypertrophy.  2. Peak RV-RA gradient 30 mmHg. Right ventricular systolic function is  mildly reduced. The right ventricular size is normal.   3. Left atrial size was moderately dilated. No left atrial/left atrial  appendage thrombus was detected.   4. No PFO/ASD by color doppler.   5. The mitral valve is abnormal. Mild to moderate mitral valve  regurgitation. No evidence of mitral stenosis.   6. The aortic valve is tricuspid. There is severe calcification of the  aortic valve. Aortic valve regurgitation is trivial. Suspect paradoxical  low flow/low gradient severe aortic valve stenosis. Aortic valve area, by  VTI measures 0.63 cm. Aortic valve   mean gradient measures 26.0 mmHg.   7. Normal caliber thoracic aorta with mild plaque.    LHC 05/04/23 Conclusions: Severe single-vessel coronary artery disease with chronic total occlusion of distal RCA with PDA and PL branches filling via left-to-right collaterals.  There is 70-80% stenosis of small-moderate first diagonal branch that is not well-suited to PCI. Mildly elevated left  ventricular filling pressure (LVEDP 20 mmHg). Moderate aortic valve stenosis (mean gradient 24 mmHg). Small/stenotic right brachial veins and absent cephalic/basilic veins, not suitable for right heart catheterization.   Recommendations: Restart gentle diuresis. Aggressive secondary prevention of coronary artery disease. Restart apixaban 5 mg twice daily tomorrow morning if no evidence of bleeding/vascular injury.   Yvonne Kendall, MD [facility]   Echo 05/01/23 1. Left ventricular ejection fraction, by estimation, is 60 to 65%. The  left ventricle has normal function. The left ventricle has no regional  wall motion abnormalities. There is mild concentric left ventricular  hypertrophy. Left ventricular diastolic  parameters are consistent with Grade II diastolic dysfunction  (pseudonormalization). Elevated left ventricular end-diastolic pressure.   2. Right ventricular systolic function is normal. The right ventricular  size is normal. There is mildly elevated pulmonary artery systolic  pressure.   3. Left atrial size was moderately dilated.   4. The mitral valve is degenerative. No evidence of mitral valve  regurgitation. Mild mitral stenosis. The mean mitral valve gradient is 7.0  mmHg with average heart rate of 73 bpm. Moderate mitral annular  calcification.   5. The aortic valve is tricuspid. There is mild calcification of the  aortic valve. There is mild thickening of the aortic valve. Aortic valve  regurgitation is not visualized. Mild aortic valve stenosis. Aortic valve  area, by VTI measures 1.08 cm.  Aortic valve mean gradient measures 10.0 mmHg. Aortic valve Vmax measures  2.13 m/s.   6. The inferior vena cava is dilated in size with <50% respiratory  variability, suggesting right atrial pressure of 15 mmHg.   Comparison(s): EF 60-65.   Patient Profile     67 y.o. 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   Assessment & Plan    Acute on chronic diastolic CHF - Echo 6/24 showed LVEF 60-65%, mild LVH, normal RV, mild mitral stenosis mean gradient 7, moderate Aortic stenosis with AVA 1.08 cm2 gradient 10, IVC dilated - TEE showed EF 55-60% with moderate LVH,  low flow/low gradient with mean gradient and AVA 0.5cm2 - Afib is also contributing to CHF, s/p DCCV - continue  Torsemide 20mg  daily - hold Jardiance with recent UTI - continue spironolactone 25mg  daily - continue Toprol 25mg  BIT  Septic shock HCAP E Coli Urosepsis - off pressors - abx per IM  Acute on chronic hypercarbic and hypoxic respiratory failure - multifactorial given OHS/OSA, PNA and CHF - 2L O2 on [place] - steroids per primary team  Afib - new onset noted 6/19 - now s/p TEE/DCCV - Toprol for rate control - amiodarone switched to oral. 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 - plan for TAVR work-up as outpatient, although not the best candidate  CAD - NSTEMI in 6/19 at which time cath shoed CTO distal RCA and 70-80% small D1 not amenable to intervention - no chest pain reported - No ASA with Eliquis - continue medical therapy  GOC - palliative to follow as OP  For questions or updates, please contact  [facility] Please consult [email or web address] for contact info under        Signed, Mindy Gali David Stall, PA-C  05/27/2023, 8:31 AM

## 2023-05-28 LAB — CULTURE, BLOOD (ROUTINE X 2)

## 2023-05-29 LAB — CULTURE, BLOOD (ROUTINE X 2)
Culture: NO GROWTH
Special Requests: ADEQUATE

## 2023-06-09 ENCOUNTER — Emergency Department: Payer: Medicare Other

## 2023-06-09 ENCOUNTER — Emergency Department
Admission: EM | Admit: 2023-06-09 | Discharge: 2023-06-09 | Disposition: A | Discharge status: Home / Self Care | Payer: Medicare Other | Attending: Emergency Medicine | Admitting: Emergency Medicine

## 2023-06-09 DIAGNOSIS — N390 Urinary tract infection, site not specified: Secondary | ICD-10-CM | POA: Diagnosis not present

## 2023-06-09 DIAGNOSIS — I509 Heart failure, unspecified: Secondary | ICD-10-CM | POA: Diagnosis not present

## 2023-06-09 DIAGNOSIS — R7989 Other specified abnormal findings of blood chemistry: Secondary | ICD-10-CM | POA: Diagnosis present

## 2023-06-09 DIAGNOSIS — E1122 Type 2 diabetes mellitus with diabetic chronic kidney disease: Secondary | ICD-10-CM | POA: Insufficient documentation

## 2023-06-09 DIAGNOSIS — K922 Gastrointestinal hemorrhage, unspecified: Secondary | ICD-10-CM | POA: Insufficient documentation

## 2023-06-09 DIAGNOSIS — N189 Chronic kidney disease, unspecified: Secondary | ICD-10-CM | POA: Diagnosis not present

## 2023-06-09 MED ORDER — CEFDINIR 300 MG PO CAPS
300.0000 mg | ORAL_CAPSULE | Freq: Once | ORAL | Status: AC
  Administered 2023-06-09: 300 mg via ORAL
  Filled 2023-06-09: qty 1

## 2023-06-09 MED ORDER — IOHEXOL 300 MG/ML  SOLN
100.0000 mL | Freq: Once | INTRAMUSCULAR | Status: AC | PRN
  Administered 2023-06-09: 80 mL via INTRAVENOUS

## 2023-06-09 MED ORDER — CEFDINIR 300 MG PO CAPS: 300.0000 mg | ORAL_CAPSULE | Freq: Two times a day (BID) | ORAL | 0 refills | Status: AC

## 2023-06-09 NOTE — ED Provider Notes (Signed)
[facility] Provider Note   Event Date/Time   First MD Initiated Contact with Patient 06/09/23 1645     (approximate) History  Abnormal labs  HPI Sonya Sanchez is a 67 y.o. female with a past medical history of chronic immobility, morbid obesity, CHF, CKD, poorly controlled type 2 diabetes, and diverticulosis as well as taking Eliquis for PE who presents complaining of generalized weakness over the last 2 days.  Patient states that [facility] where she lives told her that she had abnormal labs and that her hemoglobin was low as well as her creatinine was too high.  Upon review of patient's labs, these labs are stable from her baseline over the last year. ROS: Patient currently denies any vision changes, tinnitus, difficulty speaking, facial droop, sore throat, chest pain, shortness of breath, abdominal pain, nausea/vomiting/diarrhea, dysuria, or weakness/numbness/paresthesias in any extremity   Physical Exam  Triage Vital Signs: ED Triage Vitals  Encounter Vitals Group     BP      Systolic BP Percentile      Diastolic BP Percentile      Pulse      Resp      Temp      Temp src      SpO2      Weight      Height      Head Circumference      Peak Flow      Pain Score      Pain Loc      Pain Education      Exclude from Growth Chart    Most recent vital signs: Vitals:   06/09/23 1900 06/09/23 2000  BP: (!) 135/48 (!) 127/48  Pulse: 71 66  Resp:    Temp:    SpO2: 93% 98%   General: Awake, oriented x4. CV:  Good peripheral perfusion.  Resp:  Normal effort.  Abd:  No distention.  Other:  Elderly morbidly obese Caucasian female laying in bed in no acute distress. Rectal:  Grossly bloody stool ED Results / Procedures / Treatments  Labs (all labs ordered are listed, but only abnormal results are displayed) Labs Reviewed - No data to display  RADIOLOGY ED MD interpretation: CT of the abdomen and pelvis with IV contrast interpreted  independently by me and shows diffuse bladder wall thickening with prominence of both ureters and mild ureteral enhancement suspicion for urinary tract infection.  There is chronic diverticulosis without diverticulitis.  There is also multiple low-density lesions in the pancreas found incidentally -Agree with radiology assessment Official radiology report(s): CT ABDOMEN PELVIS W CONTRAST  Result Date: 06/09/2023 CLINICAL DATA:  Diverticulitis, complication suspected Patient reports weakness.  Abnormal labs. EXAM: CT ABDOMEN AND PELVIS WITH CONTRAST TECHNIQUE: Multidetector CT imaging of the abdomen and pelvis was performed using the standard protocol following bolus administration of intravenous contrast. RADIATION DOSE REDUCTION: This exam was performed according to the departmental dose-optimization program which includes automated exposure control, adjustment of the mA and/or kV according to patient size and/or use of iterative reconstruction technique. CONTRAST:  80mL OMNIPAQUE IOHEXOL 300 MG/ML  SOLN COMPARISON:  CT 05/14/2023 FINDINGS: Lower chest: Bilateral lower lobe atelectasis has improved from CT last month. Hepatobiliary: No acute liver abnormalities. Layering hyperdensity in the gallbladder is again seen, likely layering stones and sludge. No gallbladder inflammation. No pericholecystic inflammation. Pancreas: There are multiple low-density lesions in the pancreas are too small to characterize. This includes 9 mm in the uncinate process series 2, image  37, 8 9 mm in the body both on series 2, image 33. Question of additional low-density lesions in the tail series 2, image 34 and 35. These were not seen on prior exam in the absence of IV contrast. No peripancreatic inflammation. No ductal dilatation. Spleen: Coarse calcifications centrally.  Normal in size. Adrenals/Urinary Tract: 9 mm left adrenal myelolipoma, needs no further imaging follow-up. Normal right adrenal gland. Cortical scarring in the  right kidney. There is mild prominence of both ureters with mild ureteral enhancement. No frank hydronephrosis. Small bilateral renal cysts, needs no further imaging follow-up. There is mild diffuse bladder wall thickening. Stomach/Bowel: Scattered colonic diverticula without diverticulitis. No bowel obstruction or inflammation. Small volume of stool in the colon. Nondistended stomach. Vascular/Lymphatic: Advanced aortic and branch atherosclerosis. No aneurysm or acute vascular findings. No abdominopelvic adenopathy. Reproductive: Status post hysterectomy. No adnexal masses. Other: Mild bilateral retroperitoneal stranding. No free air or ascites. There is postsurgical change of the anterior abdominal wall. Musculoskeletal: Bony under mineralization. There are no acute or suspicious osseous abnormalities. IMPRESSION: 1. Mild diffuse bladder wall thickening. Mild prominence of both ureters and mild ureteral enhancement, suspicious for urinary tract infection. Recommend correlation with urinalysis. 2. Colonic diverticulosis without diverticulitis. 3. Multiple low-density lesions in the pancreas are too small to characterize. These were not seen on prior exam in the absence of IV contrast. For cysts of this size given patient's age, recommend follow-up imaging in 2 years. 4. Layering hyperdensity in the gallbladder, likely layering stones and sludge. No gallbladder inflammation. Aortic Atherosclerosis (ICD10-I70.0). Electronically Signed   By: Narda Rutherford M.D.   On: 06/09/2023 19:15   PROCEDURES: Critical Care performed: No Procedures MEDICATIONS ORDERED IN ED: Medications  iohexol (OMNIPAQUE) 300 MG/ML solution 100 mL (80 mLs Intravenous Contrast Given 06/09/23 1838)  cefdinir (OMNICEF) capsule 300 mg (300 mg Oral Given 06/09/23 1942)   IMPRESSION / MDM / ASSESSMENT AND PLAN / ED COURSE  I reviewed the triage vital signs and the nursing notes.                             The patient is on the cardiac  monitor to evaluate for evidence of arrhythmia and/or significant heart rate changes.* Patient's presentation is most consistent with acute presentation with potential threat to life or bodily function. Given history and exam patients presentation most consistent with Lower GI bleed possibly secondary to hemorrhoid or other nonemergent cause of bleeding. I have low suspicion for Aortoenteric fistula, Upper GI Bleed, IBD, Mesenteric Ischemia, Rectal foreign body or ulcer.  Workup: CBC, BMP from outside hospital stable compared to previous  Disposition: Discharge. Hemodynamically stable. SRP and prompt PCP follow up.   FINAL CLINICAL IMPRESSION(S) / ED DIAGNOSES   Final diagnoses:  Acute lower GI bleeding  Urinary tract infection without hematuria, site unspecified   Rx / DC Orders   ED Discharge Orders          Ordered    Ambulatory Referral to Primary Care (Establish Care)        06/09/23 1924    cefdinir (OMNICEF) 300 MG capsule  2 times daily        06/09/23 1926           Note:  This document was prepared using Dragon voice recognition software and may include unintentional dictation errors.   Merwyn Katos, MD 06/09/23 [phone]

## 2023-06-09 NOTE — ED Triage Notes (Signed)
Patient sent from [facility] for abnormal labs. Patient reports generalized weakness for the past 2 days.   BUN 72.9 Creatinine 1.38 Hgb 9.8 Hct 30.7

## 2023-06-09 NOTE — ED Notes (Signed)
Attempted to call [facility]. No answer

## 2023-06-09 NOTE — ED Notes (Signed)
Attempted to call [facility]. No answer

## 2023-06-09 NOTE — Discharge Instructions (Addendum)
Please hold your blood thinner (Eliquis) for the next 3 days.  If your bleeding has stopped within this time, please resume taking this medication, if it has not stopped, please continue to hold this medicine until follow-up with your primary care physician.

## 2023-06-20 ENCOUNTER — Ambulatory Visit: Payer: Medicare Other | Attending: Cardiology | Admitting: Cardiology

## 2023-06-20 VITALS — BP 125/45 | HR 70

## 2023-06-20 DIAGNOSIS — M3313 Other dermatomyositis without myopathy: Secondary | ICD-10-CM | POA: Insufficient documentation

## 2023-06-20 DIAGNOSIS — Z794 Long term (current) use of insulin: Secondary | ICD-10-CM | POA: Insufficient documentation

## 2023-06-20 DIAGNOSIS — Z86718 Personal history of other venous thrombosis and embolism: Secondary | ICD-10-CM | POA: Diagnosis not present

## 2023-06-20 DIAGNOSIS — Z7901 Long term (current) use of anticoagulants: Secondary | ICD-10-CM | POA: Diagnosis not present

## 2023-06-20 DIAGNOSIS — I130 Hypertensive heart and chronic kidney disease with heart failure and stage 1 through stage 4 chronic kidney disease, or unspecified chronic kidney disease: Secondary | ICD-10-CM | POA: Diagnosis not present

## 2023-06-20 DIAGNOSIS — E1159 Type 2 diabetes mellitus with other circulatory complications: Secondary | ICD-10-CM | POA: Diagnosis not present

## 2023-06-20 DIAGNOSIS — I350 Nonrheumatic aortic (valve) stenosis: Secondary | ICD-10-CM | POA: Insufficient documentation

## 2023-06-20 DIAGNOSIS — G4733 Obstructive sleep apnea (adult) (pediatric): Secondary | ICD-10-CM | POA: Diagnosis not present

## 2023-06-20 DIAGNOSIS — J9612 Chronic respiratory failure with hypercapnia: Secondary | ICD-10-CM | POA: Diagnosis present

## 2023-06-20 DIAGNOSIS — N1832 Chronic kidney disease, stage 3b: Secondary | ICD-10-CM | POA: Insufficient documentation

## 2023-06-20 DIAGNOSIS — I5032 Chronic diastolic (congestive) heart failure: Secondary | ICD-10-CM | POA: Diagnosis not present

## 2023-06-20 DIAGNOSIS — Z7401 Bed confinement status: Secondary | ICD-10-CM | POA: Diagnosis not present

## 2023-06-20 DIAGNOSIS — Z7984 Long term (current) use of oral hypoglycemic drugs: Secondary | ICD-10-CM | POA: Insufficient documentation

## 2023-06-20 DIAGNOSIS — E1122 Type 2 diabetes mellitus with diabetic chronic kidney disease: Secondary | ICD-10-CM | POA: Insufficient documentation

## 2023-06-20 DIAGNOSIS — I2510 Atherosclerotic heart disease of native coronary artery without angina pectoris: Secondary | ICD-10-CM | POA: Insufficient documentation

## 2023-06-20 DIAGNOSIS — I10 Essential (primary) hypertension: Secondary | ICD-10-CM

## 2023-06-20 DIAGNOSIS — I4891 Unspecified atrial fibrillation: Secondary | ICD-10-CM | POA: Diagnosis not present

## 2023-06-20 DIAGNOSIS — E1165 Type 2 diabetes mellitus with hyperglycemia: Secondary | ICD-10-CM

## 2023-06-20 DIAGNOSIS — M3310 Other dermatopolymyositis, organ involvement unspecified: Secondary | ICD-10-CM

## 2023-06-20 NOTE — Patient Instructions (Signed)
Routine lab work today. Will notify you of abnormal results  Follow up in 3 months  Do the following things EVERYDAY: Weigh yourself in the morning before breakfast. Write it down and keep it in a log. Take your medicines as prescribed Eat low salt foods--Limit salt (sodium) to 2000 mg per day.  Stay as active as you can everyday Limit all fluids for the day to less than 2 liters

## 2023-06-20 NOTE — Progress Notes (Signed)
ADVANCED HEART FAILURE CLINIC NOTE  Referring Physician: No ref. provider found  Primary Care: Pcp, No Primary Cardiologist:  HPI: Sonya Sanchez is a 67 y.o. female with dermatomyositis, chronic respiratory failure on 2L O2, obesity, hyperlipidemia, OSA on CPAP, hx of DVT/PE (2006),  bedbound (has not walked in 3 years), morbid obesity, HTN, CAD (RCA CTO) presenting for post hospital follow up after recent admission for hypercarbic respiratory failure & volume overload requiring intubation. TTE during that time with LVEF 65% & grade II DD with an RVSP of 44. She was aggressively diuresed and treated for sepsis 2/2 RLL PNA.   Since discharge, she feels that she is back to her baseline functional status. She need assistance with ADLs; remains mostly non-ambulatory. Her dyspnea has improved significantly.   Past Medical History:  Diagnosis Date   Calculus of kidney    Diabetes mellitus without complication (HCC)    DVT (deep venous thrombosis) (HCC)    a. on Eliquis   Family history of early CAD    a. parents passing in their 20's from CAD   Gout, unspecified    Heart murmur    Hyperlipidemia    Hypertension    Morbid obesity (HCC)    Normal cardiac stress test    a. equivocal study, sig soft tissue artifact present, no chest discomfort or ECG changes, perfusion images suggest mod sized region of mild reversible perfusion defect. Findings may be 2/2 shifting soft tissue attenuation, but cannot r/o ischemia, EF 72%   Phlebitis and thrombophlebitis of other deep vessels of lower extremities    Phlebitis and thrombophlebitis of other deep vessels of lower extremities    Pulmonary emboli (HCC)    a. on Eliquis   Spinal stenosis, unspecified region other than cervical    Tobacco abuse    Type II or unspecified type diabetes mellitus without mention of complication, uncontrolled    Unspecified sleep apnea    Urinary tract infection, site not specified     Current Outpatient Medications   Medication Sig Dispense Refill   acetaminophen (TYLENOL) 325 MG tablet Take 650 mg by mouth every 4 (four) hours as needed for mild pain, fever or headache.     amiodarone (PACERONE) 200 MG tablet Take 1 tablet (200 mg total) by mouth 2 (two) times daily. 60 tablet 0   amiodarone (PACERONE) 400 MG tablet Take 1 tablet (400 mg total) by mouth 2 (two) times daily for 5 days. 10 tablet 0   atorvastatin (LIPITOR) 40 MG tablet Take 40 mg by mouth at bedtime.     azathioprine (IMURAN) 100 MG tablet Take 100 mg by mouth 2 (two) times daily.     Calcium Carb-Cholecalciferol (CALCIUM 600+D) 600-10 MG-MCG TABS Take 1 tablet by mouth daily.     Cholecalciferol (VITAMIN D) 1000 UNITS capsule Take 2,000 Units by mouth daily.     ELIQUIS 5 MG TABS tablet TAKE 1 TABLET(5 MG) BY MOUTH TWICE DAILY 60 tablet 0   feeding supplement (ENSURE ENLIVE / ENSURE PLUS) LIQD Take 237 mLs by mouth 3 (three) times daily between meals. 237 mL 12   fluticasone (FLONASE) 50 MCG/ACT nasal spray Place 2 sprays into both nostrils daily as needed for allergies or rhinitis. 16 g 12   fluticasone furoate-vilanterol (BREO ELLIPTA) 100-25 MCG/ACT AEPB Inhale 1 puff into the lungs daily. 28 each 0   Fluticasone-Umeclidin-Vilant 100-62.5-25 MCG/ACT AEPB Inhale 1 puff into the lungs daily.     insulin aspart (NOVOLOG) 100 UNIT/ML injection  Inject 0-20 Units into the skin 4 (four) times daily -  before meals and at bedtime. 10 mL 11   insulin aspart (NOVOLOG) 100 UNIT/ML injection Inject 4-24 Units into the skin 3 (three) times daily with meals. Based on sliding scale - CBG 70 - 120: 4 units CBG 121 - 150: 7 units CBG 151 - 200: 8 units CBG 201 - 250: 11 units CBG 251 - 300: 15 units CBG 301 - 350: 19 units CBG 351 - 400: 24 units 10 mL 0   Insulin Glargine (BASAGLAR KWIKPEN) 100 UNIT/ML Inject 40 Units into the skin at bedtime.     ipratropium-albuterol (DUONEB) 0.5-2.5 (3) MG/3ML SOLN Take 3 mLs by nebulization every 4 (four) hours as  needed. 360 mL 0   lidocaine (XYLOCAINE) 2 % solution Use as directed 15 mLs in the mouth or throat every 3 (three) hours as needed for mouth pain. 100 mL 0   loratadine (CLARITIN) 10 MG tablet Take 1 tablet (10 mg total) by mouth daily as needed for allergies. 90 tablet 3   melatonin 5 MG TABS Take 1 tablet (5 mg total) by mouth at bedtime. 30 tablet 0   menthol-cetylpyridinium (CEPACOL) 3 MG lozenge Take 1 lozenge (3 mg total) by mouth as needed for sore throat. 100 tablet 12   metoprolol succinate (TOPROL-XL) 25 MG 24 hr tablet Take 1 tablet (25 mg total) by mouth daily. 30 tablet 0   Multiple Vitamin (MULTIVITAMIN WITH MINERALS) TABS tablet Take 1 tablet by mouth daily.     pantoprazole (PROTONIX) 40 MG tablet Take 1 tablet (40 mg total) by mouth daily. 30 tablet 0   polyethylene glycol (MIRALAX / GLYCOLAX) 17 g packet Take 17 g by mouth daily as needed for mild constipation. 14 each 0   pregabalin (LYRICA) 200 MG capsule Take 200 mg by mouth 3 (three) times daily.     Semaglutide, 2 MG/DOSE, (OZEMPIC, 2 MG/DOSE,) 8 MG/3ML SOPN Inject 0.25 mg once weekly for 4 week and increase to 0.5 mg weekly and follow up with PCP. 3 mL 0   senna-docusate (SENOKOT-S) 8.6-50 MG tablet Take 1 tablet by mouth at bedtime as needed for moderate constipation. 30 tablet 0   spironolactone (ALDACTONE) 25 MG tablet Take 25 mg by mouth daily.     torsemide (DEMADEX) 20 MG tablet Take 1 tablet (20 mg total) by mouth daily. 30 tablet 0   No current facility-administered medications for this visit.    Allergies  Allergen Reactions   Sulfa Antibiotics Other (See Comments)    Reaction: isn't certain, thinks she ran a fever, or had a rash, maybe both.      Social History   Socioeconomic History   Marital status: Single    Spouse name: Not on file   Number of children: Not on file   Years of education: Not on file   Highest education level: Not on file  Occupational History   Not on file  Tobacco Use    Smoking status: Former    Current packs/day: 0.00    Average packs/day: 1 pack/day for 10.0 years (10.0 ttl pk-yrs)    Types: Cigarettes    Start date: 12/25/1989    Quit date: 12/26/1999    Years since quitting: 23.4   Smokeless tobacco: Never  Substance and Sexual Activity   Alcohol use: Yes    Comment: socially   Drug use: No   Sexual activity: Not Currently  Other Topics Concern   Not  on file  Social History Narrative   Lives in [place], [place] summer (near the [place]), [place] Dec-Mar.  Sister and children live here in [place]      Work - Retired Designer, television/film set      Diet - regular      Exercise - walks, limited by knee and ankle pain   Social Determinants of Corporate investment banker Strain: Not on file  Food Insecurity: No Food Insecurity (05/18/2023)   Hunger Vital Sign    Worried About Running Out of Food in the Last Year: Never true    Ran Out of Food in the Last Year: Never true  Transportation Needs: No Transportation Needs (05/18/2023)   PRAPARE - Administrator, Civil Service (Medical): No    Lack of Transportation (Non-Medical): No  Physical Activity: Not on file  Stress: Not on file  Social Connections: Not on file  Intimate Partner Violence: Not At Risk (05/18/2023)   Humiliation, Afraid, Rape, and Kick questionnaire    Fear of Current or Ex-Partner: No    Emotionally Abused: No    Physically Abused: No    Sexually Abused: No      Family History  Problem Relation Age of Onset   Heart attack Mother 89   Cancer Mother        breast   Heart attack Father 59   Arthritis Sister     PHYSICAL EXAM: Vitals:   06/20/23 1434  BP: (!) 125/45  Pulse: 70  SpO2: 97%   GENERAL: chronically ill appearing WF in NAD HEENT: Negative for arcus senilis or xanthelasma. There is no scleral icterus.  The mucous membranes are pink and moist.   NECK: Supple, No masses. Normal carotid upstrokes without bruits. No masses or thyromegaly.    CHEST: There are  no chest wall deformities. There is no chest wall tenderness. Respirations are unlabored.  Lungs- decreased at bases; no crackles CARDIAC:  JVP: 9 cm H2O         Normal S1, S2  Normal rate with regular rhythm. No murmurs, rubs or gallops.  Pulses are 2+ and symmetrical in upper and lower extremities. 1+ edema.  ABDOMEN: Soft, non-tender, non-distended. There are no masses or hepatomegaly. There are normal bowel sounds.  EXTREMITIES: Warm and well perfused with no cyanosis, clubbing.  LYMPHATIC: No axillary or supraclavicular lymphadenopathy.  NEUROLOGIC: Patient is oriented x3 with no focal or lateralizing neurologic deficits.  PSYCH: Patients affect is appropriate, there is no evidence of anxiety or depression.  SKIN: Warm and dry; no lesions or wounds.   DATA REVIEW  ECG: 05/25/23: NSR as per my personal interpretation 06/20/23: NSR as per my read  ECHO: 05/01/23: LVEF 60-65%, normal RV function as per my personal interpretation TEE (05/25/23): LVEF 55%-60%, mildly reduced RV function, RVSP .   CATH: 05/04/23:  Severe single-vessel coronary artery disease with chronic total occlusion of distal RCA with PDA and PL branches filling via left-to-right collaterals.  There is 70-80% stenosis of small-moderate first diagonal branch that is not well-suited to PCI. Mildly elevated left ventricular filling pressure (LVEDP 20 mmHg). Moderate aortic valve stenosis (mean gradient 24 mmHg). Small/stenotic right brachial veins and absent cephalic/basilic veins, not suitable for right heart catheterization.   ASSESSMENT & PLAN:  Heart Failure with preserved EF - Today on exam she is euvolemic with chronic 1+ edema in the RLE - Continue torsemide 20mg  daily; instructed to take an additional 20mg  PRN for weight gain / LE  edema / dyspnea related to volume overload.  - Continue spironolactone 25mg  daily  - Will hold off on SGLT2I due to recent admission for E. Coli UTI  2. Chronic  hypercapnic/hypoxemic respiratory failure  - On baseline 2L O2 at home with CPAP at night.   3. Atrial fibrillation  - Diagnosed during recent admission (6/24). Now S/P TEE/DCCV on amiodarone 200mg  daily in NSR today.  - Apixaban 5mg  BID - metoprolol 25mg  daily.   4. Hx of DVT/PE - Continue apixaban; non-ambulatory at home, high risk for recurrence.   5. pLFLG moderate-severe AS - Continue to monitor yearly with TTE - TEE in 6/24 with LVEF 55%, AVA 0.5cm and gradient of  6. CAD  - LHC on 05/18/23 w/ CTO of the distal RCA with L-R collaterals. 70-80% small D1 stenosis.   7. CKD IIIB - Repeat labs today  8. Dermatomyositis - Followed by rheumatology - Azathioprine held after recent hospitalization for PNA.    Deral Schellenberg Advanced Heart Failure Mechanical Circulatory Support

## 2023-08-16 ENCOUNTER — Inpatient Hospital Stay
Admission: EM | Admit: 2023-08-16 | Discharge: 2023-08-30 | DRG: 291 | Disposition: E | Payer: Medicare Other | Attending: Internal Medicine | Admitting: Internal Medicine

## 2023-08-16 ENCOUNTER — Other Ambulatory Visit: Payer: Self-pay

## 2023-08-16 ENCOUNTER — Emergency Department: Payer: Medicare Other

## 2023-08-16 DIAGNOSIS — Y95 Nosocomial condition: Secondary | ICD-10-CM | POA: Diagnosis present

## 2023-08-16 DIAGNOSIS — J189 Pneumonia, unspecified organism: Secondary | ICD-10-CM | POA: Diagnosis present

## 2023-08-16 DIAGNOSIS — Z7901 Long term (current) use of anticoagulants: Secondary | ICD-10-CM

## 2023-08-16 DIAGNOSIS — E1110 Type 2 diabetes mellitus with ketoacidosis without coma: Secondary | ICD-10-CM | POA: Diagnosis present

## 2023-08-16 DIAGNOSIS — I350 Nonrheumatic aortic (valve) stenosis: Secondary | ICD-10-CM | POA: Diagnosis present

## 2023-08-16 DIAGNOSIS — Z515 Encounter for palliative care: Secondary | ICD-10-CM

## 2023-08-16 DIAGNOSIS — Z66 Do not resuscitate: Secondary | ICD-10-CM | POA: Diagnosis present

## 2023-08-16 DIAGNOSIS — Z7985 Long-term (current) use of injectable non-insulin antidiabetic drugs: Secondary | ICD-10-CM

## 2023-08-16 DIAGNOSIS — Z8249 Family history of ischemic heart disease and other diseases of the circulatory system: Secondary | ICD-10-CM

## 2023-08-16 DIAGNOSIS — G4733 Obstructive sleep apnea (adult) (pediatric): Secondary | ICD-10-CM | POA: Diagnosis present

## 2023-08-16 DIAGNOSIS — J440 Chronic obstructive pulmonary disease with acute lower respiratory infection: Secondary | ICD-10-CM | POA: Diagnosis present

## 2023-08-16 DIAGNOSIS — N1831 Chronic kidney disease, stage 3a: Secondary | ICD-10-CM | POA: Diagnosis present

## 2023-08-16 DIAGNOSIS — Z8701 Personal history of pneumonia (recurrent): Secondary | ICD-10-CM

## 2023-08-16 DIAGNOSIS — E875 Hyperkalemia: Secondary | ICD-10-CM | POA: Diagnosis present

## 2023-08-16 DIAGNOSIS — M109 Gout, unspecified: Secondary | ICD-10-CM | POA: Diagnosis present

## 2023-08-16 DIAGNOSIS — Z9981 Dependence on supplemental oxygen: Secondary | ICD-10-CM

## 2023-08-16 DIAGNOSIS — M3313 Other dermatomyositis without myopathy: Secondary | ICD-10-CM | POA: Diagnosis present

## 2023-08-16 DIAGNOSIS — I959 Hypotension, unspecified: Secondary | ICD-10-CM | POA: Diagnosis present

## 2023-08-16 DIAGNOSIS — G9341 Metabolic encephalopathy: Secondary | ICD-10-CM | POA: Diagnosis present

## 2023-08-16 DIAGNOSIS — Z86718 Personal history of other venous thrombosis and embolism: Secondary | ICD-10-CM

## 2023-08-16 DIAGNOSIS — J9622 Acute and chronic respiratory failure with hypercapnia: Secondary | ICD-10-CM | POA: Diagnosis present

## 2023-08-16 DIAGNOSIS — E1122 Type 2 diabetes mellitus with diabetic chronic kidney disease: Secondary | ICD-10-CM | POA: Diagnosis present

## 2023-08-16 DIAGNOSIS — N179 Acute kidney failure, unspecified: Secondary | ICD-10-CM | POA: Diagnosis present

## 2023-08-16 DIAGNOSIS — J441 Chronic obstructive pulmonary disease with (acute) exacerbation: Secondary | ICD-10-CM | POA: Diagnosis present

## 2023-08-16 DIAGNOSIS — Z8261 Family history of arthritis: Secondary | ICD-10-CM

## 2023-08-16 DIAGNOSIS — Z7951 Long term (current) use of inhaled steroids: Secondary | ICD-10-CM

## 2023-08-16 DIAGNOSIS — Z9049 Acquired absence of other specified parts of digestive tract: Secondary | ICD-10-CM

## 2023-08-16 DIAGNOSIS — Z882 Allergy status to sulfonamides status: Secondary | ICD-10-CM

## 2023-08-16 DIAGNOSIS — Z6841 Body Mass Index (BMI) 40.0 and over, adult: Secondary | ICD-10-CM

## 2023-08-16 DIAGNOSIS — I130 Hypertensive heart and chronic kidney disease with heart failure and stage 1 through stage 4 chronic kidney disease, or unspecified chronic kidney disease: Secondary | ICD-10-CM | POA: Diagnosis present

## 2023-08-16 DIAGNOSIS — Z7401 Bed confinement status: Secondary | ICD-10-CM

## 2023-08-16 DIAGNOSIS — D631 Anemia in chronic kidney disease: Secondary | ICD-10-CM | POA: Diagnosis present

## 2023-08-16 DIAGNOSIS — I4891 Unspecified atrial fibrillation: Secondary | ICD-10-CM | POA: Diagnosis present

## 2023-08-16 DIAGNOSIS — Z86711 Personal history of pulmonary embolism: Secondary | ICD-10-CM

## 2023-08-16 DIAGNOSIS — J9621 Acute and chronic respiratory failure with hypoxia: Secondary | ICD-10-CM | POA: Diagnosis present

## 2023-08-16 DIAGNOSIS — J9602 Acute respiratory failure with hypercapnia: Secondary | ICD-10-CM | POA: Diagnosis not present

## 2023-08-16 DIAGNOSIS — Z79899 Other long term (current) drug therapy: Secondary | ICD-10-CM

## 2023-08-16 DIAGNOSIS — N390 Urinary tract infection, site not specified: Secondary | ICD-10-CM | POA: Diagnosis present

## 2023-08-16 DIAGNOSIS — Z794 Long term (current) use of insulin: Secondary | ICD-10-CM

## 2023-08-16 DIAGNOSIS — D649 Anemia, unspecified: Secondary | ICD-10-CM

## 2023-08-16 DIAGNOSIS — I5033 Acute on chronic diastolic (congestive) heart failure: Secondary | ICD-10-CM | POA: Diagnosis present

## 2023-08-16 DIAGNOSIS — E785 Hyperlipidemia, unspecified: Secondary | ICD-10-CM | POA: Diagnosis present

## 2023-08-16 DIAGNOSIS — Z1152 Encounter for screening for COVID-19: Secondary | ICD-10-CM

## 2023-08-16 DIAGNOSIS — Z809 Family history of malignant neoplasm, unspecified: Secondary | ICD-10-CM

## 2023-08-16 DIAGNOSIS — Z87891 Personal history of nicotine dependence: Secondary | ICD-10-CM

## 2023-08-16 DIAGNOSIS — I2510 Atherosclerotic heart disease of native coronary artery without angina pectoris: Secondary | ICD-10-CM | POA: Diagnosis present

## 2023-08-16 DIAGNOSIS — J9690 Respiratory failure, unspecified, unspecified whether with hypoxia or hypercapnia: Secondary | ICD-10-CM | POA: Diagnosis present

## 2023-08-16 LAB — CBC WITH DIFFERENTIAL/PLATELET
Abs Immature Granulocytes: 0.08 10*3/uL — ABNORMAL HIGH (ref 0.00–0.07)
Basophils Absolute: 0.1 10*3/uL (ref 0.0–0.1)
Basophils Relative: 1 %
Eosinophils Absolute: 0.1 10*3/uL (ref 0.0–0.5)
Eosinophils Relative: 2 %
HCT: 25.2 % — ABNORMAL LOW (ref 36.0–46.0)
Hemoglobin: 7.7 g/dL — ABNORMAL LOW (ref 12.0–15.0)
Immature Granulocytes: 1 %
Lymphocytes Relative: 5 %
Lymphs Abs: 0.4 10*3/uL — ABNORMAL LOW (ref 0.7–4.0)
MCH: 30.9 pg (ref 26.0–34.0)
MCHC: 30.6 g/dL (ref 30.0–36.0)
MCV: 101.2 fL — ABNORMAL HIGH (ref 80.0–100.0)
Monocytes Absolute: 0.3 10*3/uL (ref 0.1–1.0)
Monocytes Relative: 4 %
Neutro Abs: 6.6 10*3/uL (ref 1.7–7.7)
Neutrophils Relative %: 87 %
Platelets: 236 10*3/uL (ref 150–400)
RBC: 2.49 MIL/uL — ABNORMAL LOW (ref 3.87–5.11)
RDW: 23.2 % — ABNORMAL HIGH (ref 11.5–15.5)
Smear Review: NORMAL
WBC: 7.5 10*3/uL (ref 4.0–10.5)
nRBC: 0.3 % — ABNORMAL HIGH (ref 0.0–0.2)

## 2023-08-16 LAB — BPAM RBC
Blood Product Expiration Date: 202410212359
Unit Type and Rh: 6200

## 2023-08-16 LAB — CREATININE, SERUM
Creatinine, Ser: 1.75 mg/dL — ABNORMAL HIGH (ref 0.44–1.00)
GFR, Estimated: 31 mL/min — ABNORMAL LOW (ref >60)

## 2023-08-16 LAB — CBC
HCT: 24.3 % — ABNORMAL LOW (ref 36.0–46.0)
Hemoglobin: 7.7 g/dL — ABNORMAL LOW (ref 12.0–15.0)
MCH: 31.8 pg (ref 26.0–34.0)
MCHC: 31.7 g/dL (ref 30.0–36.0)
MCV: 100.4 fL — ABNORMAL HIGH (ref 80.0–100.0)
Platelets: 165 10*3/uL (ref 150–400)
RBC: 2.42 MIL/uL — ABNORMAL LOW (ref 3.87–5.11)
RDW: 23.8 % — ABNORMAL HIGH (ref 11.5–15.5)
WBC: 5.7 10*3/uL (ref 4.0–10.5)
nRBC: 0.4 % — ABNORMAL HIGH (ref 0.0–0.2)

## 2023-08-16 LAB — COMPREHENSIVE METABOLIC PANEL
ALT: 13 U/L (ref 0–44)
AST: 16 U/L (ref 15–41)
Albumin: 2.9 g/dL — ABNORMAL LOW (ref 3.5–5.0)
Alkaline Phosphatase: 54 U/L (ref 38–126)
Anion gap: 15 (ref 5–15)
BUN: 77 mg/dL — ABNORMAL HIGH (ref 8–23)
CO2: 23 mmol/L (ref 22–32)
Calcium: 8.5 mg/dL — ABNORMAL LOW (ref 8.9–10.3)
Chloride: 97 mmol/L — ABNORMAL LOW (ref 98–111)
Creatinine, Ser: 1.64 mg/dL — ABNORMAL HIGH (ref 0.44–1.00)
GFR, Estimated: 34 mL/min — ABNORMAL LOW (ref >60)
Glucose, Bld: 88 mg/dL (ref 70–99)
Potassium: 5.1 mmol/L (ref 3.5–5.1)
Sodium: 135 mmol/L (ref 135–145)
Total Bilirubin: 1.3 mg/dL — ABNORMAL HIGH (ref 0.3–1.2)
Total Protein: 6.7 g/dL (ref 6.5–8.1)

## 2023-08-16 LAB — URINALYSIS, ROUTINE W REFLEX MICROSCOPIC
Bilirubin Urine: NEGATIVE
Glucose, UA: NEGATIVE mg/dL
Ketones, ur: 5 mg/dL — AB
Nitrite: NEGATIVE
Protein, ur: 30 mg/dL — AB
Specific Gravity, Urine: 1.011 (ref 1.005–1.030)
pH: 5 (ref 5.0–8.0)

## 2023-08-16 LAB — BLOOD GAS, VENOUS
Acid-base deficit: 3.8 mmol/L — ABNORMAL HIGH (ref 0.0–2.0)
Acid-base deficit: 7.6 mmol/L — ABNORMAL HIGH (ref 0.0–2.0)
Bicarbonate: 26.7 mmol/L (ref 20.0–28.0)
Bicarbonate: 22.5 mmol/L (ref 20.0–28.0)
O2 Saturation: 88.7 %
O2 Saturation: 80 %
Patient temperature: 37
Patient temperature: 37
pCO2, Ven: 75 mmHg (ref 44–60)
pCO2, Ven: 66 mmHg — ABNORMAL HIGH (ref 44–60)
pH, Ven: 7.16 — CL (ref 7.25–7.43)
pH, Ven: 7.14 — CL (ref 7.25–7.43)
pO2, Ven: 61 mmHg — ABNORMAL HIGH (ref 32–45)
pO2, Ven: 50 mmHg — ABNORMAL HIGH (ref 32–45)

## 2023-08-16 LAB — TYPE AND SCREEN
ABO/RH(D): A POS
Antibody Screen: NEGATIVE
Unit division: 0

## 2023-08-16 LAB — GLUCOSE, CAPILLARY
Glucose-Capillary: 82 mg/dL (ref 70–99)
Glucose-Capillary: 116 mg/dL — ABNORMAL HIGH (ref 70–99)
Glucose-Capillary: 90 mg/dL (ref 70–99)
Glucose-Capillary: 145 mg/dL — ABNORMAL HIGH (ref 70–99)

## 2023-08-16 LAB — MRSA NEXT GEN BY PCR, NASAL: MRSA by PCR Next Gen: NOT DETECTED

## 2023-08-16 LAB — APTT
aPTT: 28 s (ref 24–36)
aPTT: 84 s — ABNORMAL HIGH (ref 24–36)

## 2023-08-16 LAB — PROCALCITONIN: Procalcitonin: 0.26 ng/mL

## 2023-08-16 LAB — MAGNESIUM: Magnesium: 1.9 mg/dL (ref 1.7–2.4)

## 2023-08-16 LAB — SARS CORONAVIRUS 2 BY RT PCR: SARS Coronavirus 2 by RT PCR: NEGATIVE

## 2023-08-16 LAB — BRAIN NATRIURETIC PEPTIDE: B Natriuretic Peptide: 980.8 pg/mL — ABNORMAL HIGH (ref 0.0–100.0)

## 2023-08-16 LAB — LACTIC ACID, PLASMA
Lactic Acid, Venous: 0.6 mmol/L (ref 0.5–1.9)
Lactic Acid, Venous: 0.9 mmol/L (ref 0.5–1.9)

## 2023-08-16 LAB — TROPONIN I (HIGH SENSITIVITY)
Troponin I (High Sensitivity): 42 ng/L — ABNORMAL HIGH (ref <18)
Troponin I (High Sensitivity): 42 ng/L — ABNORMAL HIGH (ref <18)

## 2023-08-16 LAB — HEPARIN LEVEL (UNFRACTIONATED): Heparin Unfractionated: >1.1 [IU]/mL — ABNORMAL HIGH (ref 0.30–0.70)

## 2023-08-16 LAB — PROTIME-INR
INR: 1.5 — ABNORMAL HIGH (ref 0.8–1.2)
Prothrombin Time: 18.1 s — ABNORMAL HIGH (ref 11.4–15.2)

## 2023-08-16 LAB — PHOSPHORUS: Phosphorus: 4.8 mg/dL — ABNORMAL HIGH (ref 2.5–4.6)

## 2023-08-16 MED ORDER — SODIUM CHLORIDE 0.9 % IV SOLN
500.0000 mg | Freq: Once | INTRAVENOUS | Status: AC
  Administered 2023-08-16: 500 mg via INTRAVENOUS
  Filled 2023-08-16: qty 5

## 2023-08-16 MED ORDER — IPRATROPIUM-ALBUTEROL 0.5-2.5 (3) MG/3ML IN SOLN
3.0000 mL | Freq: Once | RESPIRATORY_TRACT | Status: AC
  Administered 2023-08-16: 3 mL via RESPIRATORY_TRACT
  Filled 2023-08-16: qty 3

## 2023-08-16 MED ORDER — METHYLPREDNISOLONE SODIUM SUCC 125 MG IJ SOLR
125.0000 mg | Freq: Once | INTRAMUSCULAR | Status: AC
  Administered 2023-08-16: 125 mg via INTRAVENOUS
  Filled 2023-08-16: qty 2

## 2023-08-16 MED ORDER — SODIUM CHLORIDE 0.9 % IV SOLN
2.0000 g | Freq: Two times a day (BID) | INTRAVENOUS | Status: DC
  Filled 2023-08-16: qty 12.5

## 2023-08-16 MED ORDER — ONDANSETRON HCL 4 MG/2ML IJ SOLN: 4.0000 mg | Freq: Four times a day (QID) | INTRAMUSCULAR | Status: DC | PRN

## 2023-08-16 MED ORDER — IPRATROPIUM-ALBUTEROL 0.5-2.5 (3) MG/3ML IN SOLN: 3.0000 mL | Freq: Once | RESPIRATORY_TRACT | Status: DC

## 2023-08-16 MED ORDER — SODIUM CHLORIDE 0.9 % IV SOLN: 250.0000 mL | INTRAVENOUS | Status: DC | PRN

## 2023-08-16 MED ORDER — FUROSEMIDE 10 MG/ML IJ SOLN
20.0000 mg | Freq: Once | INTRAMUSCULAR | Status: AC
  Administered 2023-08-16: 20 mg via INTRAVENOUS
  Filled 2023-08-16: qty 2

## 2023-08-16 MED ORDER — INSULIN ASPART 100 UNIT/ML IJ SOLN
0.0000 [IU] | Freq: Three times a day (TID) | INTRAMUSCULAR | Status: DC
  Administered 2023-08-17: 4 [IU] via SUBCUTANEOUS
  Administered 2023-08-17: 11 [IU] via SUBCUTANEOUS
  Filled 2023-08-16 (×2): qty 1

## 2023-08-16 MED ORDER — HEPARIN BOLUS VIA INFUSION
4000.0000 [IU] | Freq: Once | INTRAVENOUS | Status: AC
  Administered 2023-08-16: 4000 [IU] via INTRAVENOUS
  Filled 2023-08-16: qty 4000

## 2023-08-16 MED ORDER — VANCOMYCIN HCL 1750 MG/350ML IV SOLN: 1750.0000 mg | INTRAVENOUS | Status: DC

## 2023-08-16 MED ORDER — SODIUM CHLORIDE 0.9 % IV SOLN: 250.0000 mL | INTRAVENOUS | Status: DC

## 2023-08-16 MED ORDER — HEPARIN SODIUM (PORCINE) 5000 UNIT/ML IJ SOLN
5000.0000 [IU] | Freq: Three times a day (TID) | INTRAMUSCULAR | Status: DC
  Administered 2023-08-16: 5000 [IU] via SUBCUTANEOUS
  Filled 2023-08-16: qty 1

## 2023-08-16 MED ORDER — SODIUM CHLORIDE 0.9% FLUSH: 3.0000 mL | INTRAVENOUS | Status: DC | PRN

## 2023-08-16 MED ORDER — SODIUM CHLORIDE 0.9 % IV SOLN: 10.0000 mL/h | Freq: Once | INTRAVENOUS | Status: DC

## 2023-08-16 MED ORDER — MIDODRINE HCL 5 MG PO TABS
5.0000 mg | ORAL_TABLET | Freq: Three times a day (TID) | ORAL | Status: DC
  Filled 2023-08-16 (×2): qty 1

## 2023-08-16 MED ORDER — VANCOMYCIN HCL 2000 MG/400ML IV SOLN
2000.0000 mg | Freq: Once | INTRAVENOUS | Status: AC
  Administered 2023-08-16: 2000 mg via INTRAVENOUS
  Filled 2023-08-16: qty 400

## 2023-08-16 MED ORDER — SODIUM CHLORIDE 0.9 % IV BOLUS: 1000.0000 mL | Freq: Once | INTRAVENOUS | Status: DC

## 2023-08-16 MED ORDER — SODIUM CHLORIDE 0.9 % IV BOLUS: 500.0000 mL | Freq: Once | INTRAVENOUS | Status: DC

## 2023-08-16 MED ORDER — PIPERACILLIN-TAZOBACTAM 3.375 G IVPB
3.3750 g | Freq: Three times a day (TID) | INTRAVENOUS | Status: DC
  Administered 2023-08-16 – 2023-08-17 (×3): 3.375 g via INTRAVENOUS
  Filled 2023-08-16 (×3): qty 50

## 2023-08-16 MED ORDER — CHLORHEXIDINE GLUCONATE CLOTH 2 % EX PADS
6.0000 | MEDICATED_PAD | Freq: Every day | CUTANEOUS | Status: DC
  Administered 2023-08-16 – 2023-08-17 (×2): 6 via TOPICAL

## 2023-08-16 MED ORDER — VANCOMYCIN HCL IN DEXTROSE 1-5 GM/200ML-% IV SOLN: 1000.0000 mg | INTRAVENOUS | Status: DC

## 2023-08-16 MED ORDER — HEPARIN (PORCINE) 25000 UT/250ML-% IV SOLN
1200.0000 [IU]/h | INTRAVENOUS | Status: DC
  Administered 2023-08-16 – 2023-08-17 (×2): 1200 [IU]/h via INTRAVENOUS
  Filled 2023-08-16 (×2): qty 250

## 2023-08-16 MED ORDER — SODIUM CHLORIDE 0.9 % IV SOLN
2.0000 g | Freq: Once | INTRAVENOUS | Status: AC
  Administered 2023-08-16: 2 g via INTRAVENOUS
  Filled 2023-08-16: qty 12.5

## 2023-08-16 MED ORDER — FUROSEMIDE 10 MG/ML IJ SOLN
40.0000 mg | Freq: Once | INTRAMUSCULAR | Status: DC
  Filled 2023-08-16: qty 4

## 2023-08-16 MED ORDER — SODIUM CHLORIDE 0.9% FLUSH
3.0000 mL | Freq: Two times a day (BID) | INTRAVENOUS | Status: DC
  Administered 2023-08-16 – 2023-08-17 (×3): 3 mL via INTRAVENOUS

## 2023-08-16 MED ORDER — NOREPINEPHRINE 4 MG/250ML-% IV SOLN
2.0000 ug/min | INTRAVENOUS | Status: DC
  Administered 2023-08-16: 2 ug/min via INTRAVENOUS
  Administered 2023-08-17: 10 ug/min via INTRAVENOUS
  Filled 2023-08-16 (×4): qty 250

## 2023-08-16 NOTE — Consult Note (Signed)
Pharmacy Antibiotic Note  Sonya Sanchez is a 67 y.o. female admitted on 08/04/2023 with pneumonia. PMH significant for bedbound, CAD, dermatomyositis, chronic RF (2L O2 baseline), OSA on CPAP, obesity, HLD, DVT/PE (2006). Pharmacy has been consulted for vancomycin, Zosyn dosing.  Plan: Day 1 of antibiotics Start vancomycin 1000 mg IV Q24H. Goal AUC 400-550. Expected AUC: 481.1 Expected Css min: 13.9 SCr used: 1.75 (baseline ~1.5)  Weight used: IBW, Vd used: 0.50 (BMI 53.6) Discontinue cefepime Start Zosyn 3.375 g IV Q8H Continue to monitor renal function and follow culture results   Height: 5\' 2"  (157.5 cm) Weight: (S) 133.2 kg (293 lb 10.4 oz) (Bed weight) IBW/kg (Calculated) : 50.1  Temp (24hrs), Avg:97.3 F (36.3 C), Min:97.1 F (36.2 C), Max:97.4 F (36.3 C)  Recent Labs  Lab 08/15/2023 0901 08/28/2023 0902 08/21/2023 1101 08/13/2023 1157  WBC 7.5  --  5.7  --   CREATININE 1.64*  --  1.75*  --   LATICACIDVEN  --  0.6  --  0.9    Estimated Creatinine Clearance: 39.9 mL/min (A) (by C-G formula based on SCr of 1.75 mg/dL (H)).    Allergies  Allergen Reactions   Sulfa Antibiotics Other (See Comments)    Reaction: isn't certain, thinks she ran a fever, or had a rash, maybe both.    Antimicrobials this admission: 9/17 Cefepime x1 9/17 Vancomycin >>  9/17 Zosyn >>   Dose adjustments this admission: N/A  Microbiology results: 9/17 BCx: IP 9/17 Sputum: ordered  9/17 MRSA PCR: negative   Thank you for allowing pharmacy to be a part of this patient's care.  Celene Squibb, PharmD Clinical Pharmacist 08/02/2023 7:09 PM

## 2023-08-16 NOTE — H&P (Signed)
NAME:  Sonya Sanchez, MRN:  [id number], DOB:  1956/06/04, LOS: 0 ADMISSION DATE:  08/27/2023, CONSULTATION DATE:  08/11/2023 REFERRING MD:  Dr. Erma Heritage, CHIEF COMPLAINT:  Shortness of breath   Brief Pt Description / Synopsis:  67 y.o. female admitted with Acute on Chronic Hypoxic & Hypercapnic Respiratory Failure in the setting of Acute Decompensated HFpEF and suspected HCAP requiring BiPAP.  History of Present Illness:  Sonya Sanchez is a 67 y.o. female with a past medical history significant for dermatomyositis, chronic respiratory failure on 2L O2, HFpEF, severe aortic stenosis, obesity, hyperlipidemia, OSA on CPAP, hx of DVT/PE (2006), bedbound (has not walked in 3 years), morbid obesity, HTN, CAD (RCA CTO) who presented to [facility] ED on 08/08/2023 from [place] [facility] due to complaints of progressive shortness of breath over the past few days.  Upon presentation to the ED she was stating that she "can't breathe".  She denies any dizziness, chest pain, palpitations, cough, wheezing, fever, chills, abdominal pain, nausea, vomiting, diarrhea.  She reportedly has been taking ciprofloxacin for recent diagnosis of UTI about 2 days ago, currently denies any dysuria or other urinary symptoms.  Reports she has been compliant with her CPAP at home.  Of note she was admitted at [facility] from 05/13/23 through 05/27/23 for treatment of Acute on Chronic Hypoxic and Hypercapnic Respiratory Failure in the setting of Acute Decompensated HFpEF and Severe Sepsis from MRSA Pneumonia and E.Coli Urosepsis.  ED Course: Initial Vital Signs: Temperature 97.2 F axillary, respiratory rate 19, pulse 86, blood pressure 103/47, SpO2 86% on 4L [place] Significant Labs: BUN 77, creatinine 1.64, BNP 980, high-sensitivity troponin 42, lactic acid 0.6, procalcitonin 0.26, WBC 7.5, hemoglobin 7.7 VBG: pH 7.16/pCO2 75/pO2 61/bicarb 26.7 COVID-19 PCR is negative Imaging Chest X-ray>>IMPRESSION: 1. New left  retrocardiac airspace opacity suggesting combination of left lower lobe atelectasis and/or consolidation with pleural effusion. 2. Increasing triangular opacity overlying the right mid lower lung zone, which may represent combination of atelectasis and/or pneumonia. Probable trace right pleural effusion. Medications Administered: IV Azithromycin, Cefepime, and Vancomycin, Duonebs x3, 125 mg Solumedrol  PCCM asked to admit for further workup and treatment.  Please see "significant hospital events" section below for full detailed hospital course.   Pertinent  Medical History   Past Medical History:  Diagnosis Date   Calculus of kidney    Diabetes mellitus without complication (HCC)    DVT (deep venous thrombosis) (HCC)    a. on Eliquis   Family history of early CAD    a. parents passing in their 82's from CAD   Gout, unspecified    Heart murmur    Hyperlipidemia    Hypertension    Morbid obesity (HCC)    Normal cardiac stress test    a. equivocal study, sig soft tissue artifact present, no chest discomfort or ECG changes, perfusion images suggest mod sized region of mild reversible perfusion defect. Findings may be 2/2 shifting soft tissue attenuation, but cannot r/o ischemia, EF 72%   Phlebitis and thrombophlebitis of other deep vessels of lower extremities    Phlebitis and thrombophlebitis of other deep vessels of lower extremities    Pulmonary emboli (HCC)    a. on Eliquis   Spinal stenosis, unspecified region other than cervical    Tobacco abuse    Type II or unspecified type diabetes mellitus without mention of complication, uncontrolled    Unspecified sleep apnea    Urinary tract infection, site not specified     Micro Data:  9/17: SARS-CoV-2 PCR> negative 9/17: Respiratory viral panel>> 9/17: Blood culture x 2 9/17: Urine>> 9/17: Strep pneumo urinary antigen>> 9/17: Legionella urine antigen>> 9/17: Sputum>>  Antimicrobials:   Anti-infectives (From admission,  onward)    Start     Dose/Rate Route Frequency Ordered Stop   08/03/2023 1045  azithromycin (ZITHROMAX) 500 mg in sodium chloride 0.9 % 250 mL IVPB        500 mg 250 mL/hr over 60 Minutes Intravenous  Once 08/20/2023 1041     08/28/2023 1000  ceFEPIme (MAXIPIME) 2 g in sodium chloride 0.9 % 100 mL IVPB        2 g 200 mL/hr over 30 Minutes Intravenous  Once 08/12/2023 0943 08/05/2023 1026   08/01/2023 1000  vancomycin (VANCOREADY) IVPB 2000 mg/400 mL        2,000 mg 200 mL/hr over 120 Minutes Intravenous  Once 08/13/2023 0943         Significant Hospital Events: Including procedures, antibiotic start and stop dates in addition to other pertinent events   9/17: Presented to ED, requiring BiPAP.  PCCM asked to admit  Interim History / Subjective:  -Patient seen in ICU -Sleeping, arouses easily to voice -On BiPAP, currently tolerating, reports breathing is improved since admission -Denies dizziness, chest pain, cough, wheezing, edema, dysuria  Objective   Blood pressure 97/67, pulse 65, temperature (!) 97.4 F (36.3 C), temperature source Axillary, resp. rate 13, height 5\' 2"  (1.575 m), weight (S) 133.2 kg, SpO2 100%.    FiO2 (%):  [50 %] 50 %   Intake/Output Summary (Last 24 hours) at 08/27/2023 1142 Last data filed at 08/24/2023 1132 Gross per 24 hour  Intake 103 ml  Output --  Net 103 ml   Filed Weights   08/15/2023 0907  Weight: (S) 133.2 kg    Examination: General: Acute on chronically ill-appearing obese female, laying in bed, on BiPAP, no acute distress HENT: Atraumatic, normocephalic, neck supple, unable to assess JVD due to body habitus and BiPAP Lungs: Diminished breath sounds throughout, BiPAP assisted, even, nonlabored, normal effort Cardiovascular: Regular rate and rhythm, S1-S2, no murmurs, rubs, gallops Abdomen: Obese, soft, nontender, nondistended, no guarding or rebound tenderness, bowel sounds positive x 4 Extremities: No deformities, 2+ edema bilateral lower  extremities Neuro: Sleeping, arouses easily to voice, oriented x 3, moves all extremities to command, no focal deficits GU: External catheter in place  Resolved Hospital Problem list     Assessment & Plan:   #Acute on Chronic Hypoxic & Hypercapnic Respiratory Failure in the setting of suspected Pneumonia (HCAP) and Pulmonary Edema PMHx: PE on Eliquis, OSA on CPAP, morbid obesity -Supplemental O2 as needed to maintain O2 sats >92% -BiPAP, wean as tolerated (needs CPAP at bedtime once weaned off BiPAP) -Follow intermittent Chest X-ray & ABG as needed -Bronchodilators prn -ABX as above -Diuresis as BP and renal function permits ~ will give 20 mg IV Lasix x2 dose due to soft BP -Pulmonary toilet as able  #Acute Decompensated HFpEF #Severe Aortic Stenosis #Mildly Elevated Troponin, suspect demand ischemia PMHx: CAD,HTN, HLD, A.fib s/p cardioversion on 05/25/23, DVT/PE on Eliquis Echocardiogram (TEE) 05/24/23: LVEF 55 to 60%, moderate LVH,  grade II DD, RV systolic function mildly reduced, RVSP 30 mmHg, mild to moderate MR, severe AS CATH: 05/04/23: Severe single-vessel coronary artery disease with chronic total occlusion of distal RCA with PDA and PL branches filling via left-to-right collaterals.  There is 70-80% stenosis of small-moderate first diagonal branch that is not well-suited to PCI.  Mildly elevated left ventricular filling pressure (LVEDP 20 mmHg). Moderate aortic valve stenosis (mean gradient 24 mmHg). Small/stenotic right brachial veins and absent cephalic/basilic veins, not suitable for right heart catheterization. -Continuous cardiac monitoring -Maintain MAP >65 -Cautious IV fluids -Vasopressors as needed to maintain MAP goal -Start Midodrine -Lactic acid is normal (0.6 ~ 0.9) -Trend HS Troponin until peaked (42 ~ 42) -BNP is 980 -Consider repeat Echocardiogram  -Diuresis as BP and renal function permits ~ will give 20 mg IV Lasix x1 dose due to soft BP -Hold home Torsemide  and Spironolactone -Hold home Eliquis, will start Heparin drip for now during acute illness  #Suspected HCAP #Recent UTI being treated with Cipro outpatient PMHx: E. Coli urosepsis & MRSA Pneumonia in June 2024 -Monitor fever curve -Trend WBC's & Procalcitonin -Follow cultures as above -Continue empiric Cefepime & Vancomycin pending cultures & sensitivities  #Acute Kidney Injury on CKD Stage IIIa Baseline labs from 05/27/23: Creatinine 1.24, GFR 47 -Monitor I&O's / urinary output -Follow BMP -Ensure adequate renal perfusion -Avoid nephrotoxic agents as able -Replace electrolytes as indicated ~ Pharmacy following for assistance with electrolyte replacement  #Anemia in Chronic Kidney Disease -Monitor for S/Sx of bleeding -Trend CBC -Heparin SQ for VTE Prophylaxis  -Transfuse for Hgb <7  #Diabetes Mellitus -CBG's q4h; Target range of 140 to 180 -SSI -Follow ICU Hypo/Hyperglycemia protocol  #Dermatomyositis -Followed by rheumatology outpatient -Holding Azathioprine for suspected PNA & UTI   #Acute Metabolic Encephalopathy -Treatment of metabolic derangements as outlined above -Provide supportive care -Promote normal sleep/wake cycle and family presence -Avoid sedating medications as able     Best Practice (right click and "Reselect all SmartList Selections" daily)   Diet/type: NPO until respiratory status improved DVT prophylaxis: prophylactic heparin  GI prophylaxis: N/A Lines: N/A Foley:  N/A Code Status:  full code Last date of multidisciplinary goals of care discussion [N/A]  9/17: Pt updated at bedside on plan of care.  Labs   CBC: Recent Labs  Lab 08/26/2023 0901  WBC 7.5  NEUTROABS 6.6  HGB 7.7*  HCT 25.2*  MCV 101.2*  PLT 236    Basic Metabolic Panel: Recent Labs  Lab 08/19/2023 0901  NA 135  K 5.1  CL 97*  CO2 23  GLUCOSE 88  BUN 77*  CREATININE 1.64*  CALCIUM 8.5*   GFR: Estimated Creatinine Clearance: 42.6 mL/min (A) (by C-G formula  based on SCr of 1.64 mg/dL (H)). Recent Labs  Lab 08/14/2023 0901 08/28/2023 0902  PROCALCITON 0.26  --   WBC 7.5  --   LATICACIDVEN  --  0.6    Liver Function Tests: Recent Labs  Lab 08/17/2023 0901  AST 16  ALT 13  ALKPHOS 54  BILITOT 1.3*  PROT 6.7  ALBUMIN 2.9*   No results for input(s): "LIPASE", "AMYLASE" in the last 168 hours. No results for input(s): "AMMONIA" in the last 168 hours.  ABG    Component Value Date/Time   PHART 7.34 (L) 05/18/2023 2124   PCO2ART 46 05/18/2023 2124   PO2ART 131 (H) 05/18/2023 2124   HCO3 22.5 08/19/2023 1030   ACIDBASEDEF 7.6 (H) 08/28/2023 1030   O2SAT 80 08/10/2023 1030     Coagulation Profile: No results for input(s): "INR", "PROTIME" in the last 168 hours.  Cardiac Enzymes: No results for input(s): "CKTOTAL", "CKMB", "CKMBINDEX", "TROPONINI" in the last 168 hours.  HbA1C: Hgb A1c MFr Bld  Date/Time Value Ref Range Status  04/30/2023 05:11 AM 9.1 (H) 4.8 - 5.6 % Final  Comment:    (NOTE)         Prediabetes: 5.7 - 6.4         Diabetes: >6.4         Glycemic control for adults with diabetes: <7.0   05/24/2019 09:29 AM 10.4 (H) 4.6 - 6.5 % Final    Comment:    Glycemic Control Guidelines for People with Diabetes:Non Diabetic:  <6%Goal of Therapy: <7%Additional Action Suggested:  >8%     CBG: No results for input(s): "GLUCAP" in the last 168 hours.  Review of Systems:   Positives in BOLD: Gen: Denies fever, chills, weight change, fatigue, night sweats HEENT: Denies blurred vision, double vision, hearing loss, tinnitus, sinus congestion, rhinorrhea, sore throat, neck stiffness, dysphagia PULM: Denies shortness of breath, cough, sputum production, hemoptysis, wheezing CV: Denies chest pain, edema, orthopnea, paroxysmal nocturnal dyspnea, palpitations GI: Denies abdominal pain, nausea, vomiting, diarrhea, hematochezia, melena, constipation, change in bowel habits GU: Denies dysuria, hematuria, polyuria, oliguria, urethral  discharge Endocrine: Denies hot or cold intolerance, polyuria, polyphagia or appetite change Derm: Denies rash, dry skin, scaling or peeling skin change Heme: Denies easy bruising, bleeding, bleeding gums Neuro: Denies headache, numbness, weakness, slurred speech, loss of memory or consciousness   Past Medical History:  She,  has a past medical history of Calculus of kidney, Diabetes mellitus without complication (HCC), DVT (deep venous thrombosis) (HCC), Family history of early CAD, Gout, unspecified, Heart murmur, Hyperlipidemia, Hypertension, Morbid obesity (HCC), Normal cardiac stress test, Phlebitis and thrombophlebitis of other deep vessels of lower extremities, Phlebitis and thrombophlebitis of other deep vessels of lower extremities, Pulmonary emboli (HCC), Spinal stenosis, unspecified region other than cervical, Tobacco abuse, Type II or unspecified type diabetes mellitus without mention of complication, uncontrolled, Unspecified sleep apnea, and Urinary tract infection, site not specified.   Surgical History:   Past Surgical History:  Procedure Laterality Date   ABDOMINAL HYSTERECTOMY     hyperplasia of endometrium   ANKLE SURGERY     fracture s/p pin, right ankle   APPENDECTOMY     CARDIOVERSION N/A 05/25/2023   Procedure: CARDIOVERSION;  Surgeon: Laurey Morale, MD;  Location: [facility] ORS;  Service: Cardiovascular;  Laterality: N/A;   CHOLECYSTECTOMY     COLECTOMY     temporary colostomy, now reversed   INTESTINAL BYPASS     ovarian cyst ruptured, led to perforated intestine   LEFT HEART CATH AND CORONARY ANGIOGRAPHY N/A 05/04/2023   Procedure: LEFT HEART CATH AND CORONARY ANGIOGRAPHY;  Surgeon: Yvonne Kendall, MD;  Location: [facility] INVASIVE CV LAB;  Service: Cardiovascular;  Laterality: N/A;   STOMACH SURGERY     TEE WITHOUT CARDIOVERSION N/A 05/25/2023   Procedure: TRANSESOPHAGEAL ECHOCARDIOGRAM;  Surgeon: Laurey Morale, MD;  Location: [facility] ORS;  Service: Cardiovascular;   Laterality: N/A;     Social History:   reports that she quit smoking about 23 years ago. She started smoking about 33 years ago. She has a 10 pack-year smoking history. She has never used smokeless tobacco. She reports current alcohol use. She reports that she does not use drugs.   Family History:  Her family history includes Arthritis in her sister; Cancer in her mother; Heart attack (age of onset: 7) in her mother; Heart attack (age of onset: 63) in her father.   Allergies Allergies  Allergen Reactions   Sulfa Antibiotics Other (See Comments)    Reaction: isn't certain, thinks she ran a fever, or had a rash, maybe both.     Home  Medications  Prior to Admission medications   Medication Sig Start Date End Date Taking? Authorizing Provider  acetaminophen (TYLENOL) 325 MG tablet Take 650 mg by mouth every 4 (four) hours as needed for mild pain, fever or headache.    [provider]  amiodarone (PACERONE) 200 MG tablet Take 1 tablet (200 mg total) by mouth 2 (two) times daily. 06/02/23   Sunnie Nielsen, DO  amiodarone (PACERONE) 400 MG tablet Take 1 tablet (400 mg total) by mouth 2 (two) times daily for 5 days. 05/27/23 06/01/23  Sunnie Nielsen, DO  atorvastatin (LIPITOR) 40 MG tablet Take 40 mg by mouth at bedtime.    [provider]  azathioprine (IMURAN) 100 MG tablet Take 100 mg by mouth 2 (two) times daily.    [provider]  Calcium Carb-Cholecalciferol (CALCIUM 600+D) 600-10 MG-MCG TABS Take 1 tablet by mouth daily.    [provider]  Cholecalciferol (VITAMIN D) 1000 UNITS capsule Take 2,000 Units by mouth daily.    [provider]  Dulaglutide (TRULICITY) 1.5 MG/0.5ML SOPN Inject into the skin.    [provider]  ELIQUIS 5 MG TABS tablet TAKE 1 TABLET(5 MG) BY MOUTH TWICE DAILY 03/29/18   Antonieta Iba, MD  feeding supplement (ENSURE ENLIVE / ENSURE PLUS) LIQD Take 237 mLs by mouth 3 (three) times daily between meals.  05/27/23   Sunnie Nielsen, DO  fluticasone [facility]) 50 MCG/ACT nasal spray Place 2 sprays into both nostrils daily as needed for allergies or rhinitis. 02/21/19   McLean-Scocuzza, Pasty Spillers, MD  fluticasone furoate-vilanterol (BREO ELLIPTA) 100-25 MCG/ACT AEPB Inhale 1 puff into the lungs daily. 05/28/23   Sunnie Nielsen, DO  insulin aspart (NOVOLOG) 100 UNIT/ML injection Inject 0-20 Units into the skin 4 (four) times daily -  before meals and at bedtime. Patient not taking: Reported on 06/20/2023 05/27/23   Sunnie Nielsen, DO  insulin aspart (NOVOLOG) 100 UNIT/ML injection Inject 4-24 Units into the skin 3 (three) times daily with meals. Based on sliding scale - CBG 70 - 120: 4 units CBG 121 - 150: 7 units CBG 151 - 200: 8 units CBG 201 - 250: 11 units CBG 251 - 300: 15 units CBG 301 - 350: 19 units CBG 351 - 400: 24 units Patient not taking: Reported on 06/20/2023 05/27/23   Sunnie Nielsen, DO  Insulin Glargine [facility]) 100 UNIT/ML Inject 40 Units into the skin at bedtime.    [provider]  insulin lispro (HUMALOG) 100 UNIT/ML injection Inject 100 Units into the skin 3 (three) times daily before meals.    [provider]  ipratropium-albuterol (DUONEB) 0.5-2.5 (3) MG/3ML SOLN Take 3 mLs by nebulization every 4 (four) hours as needed. 05/27/23   Sunnie Nielsen, DO  lidocaine (XYLOCAINE) 2 % solution Use as directed 15 mLs in the mouth or throat every 3 (three) hours as needed for mouth pain. Patient not taking: Reported on 06/20/2023 05/27/23   Sunnie Nielsen, DO  loratadine (CLARITIN) 10 MG tablet Take 1 tablet (10 mg total) by mouth daily as needed for allergies. 02/21/19   McLean-Scocuzza, Pasty Spillers, MD  melatonin 5 MG TABS Take 1 tablet (5 mg total) by mouth at bedtime. 05/27/23   Sunnie Nielsen, DO  menthol-cetylpyridinium (CEPACOL) 3 MG lozenge Take 1 lozenge (3 mg total) by mouth as needed for sore throat. Patient not taking: Reported on 06/20/2023  05/27/23   Sunnie Nielsen, DO  metoprolol succinate (TOPROL-XL) 25 MG 24 hr tablet Take 1 tablet (  25 mg total) by mouth daily. 05/28/23   Sunnie Nielsen, DO  Multiple Vitamin (MULTIVITAMIN WITH MINERALS) TABS tablet Take 1 tablet by mouth daily. 05/27/23   Sunnie Nielsen, DO  pantoprazole (PROTONIX) 40 MG tablet Take 1 tablet (40 mg total) by mouth daily. 05/28/23   Sunnie Nielsen, DO  polyethylene glycol (MIRALAX / GLYCOLAX) 17 g packet Take 17 g by mouth daily as needed for mild constipation. 05/27/23   Sunnie Nielsen, DO  pregabalin (LYRICA) 200 MG capsule Take 200 mg by mouth 3 (three) times daily.    [provider]  Semaglutide, 2 MG/DOSE, (OZEMPIC, 2 MG/DOSE,) 8 MG/3ML SOPN Inject 0.25 mg once weekly for 4 week and increase to 0.5 mg weekly and follow up with PCP. Patient not taking: Reported on 06/20/2023 05/05/23   Agbata, Elwyn Lade, MD  senna-docusate (SENOKOT-S) 8.6-50 MG tablet Take 1 tablet by mouth at bedtime as needed for moderate constipation. 05/27/23   Sunnie Nielsen, DO  spironolactone (ALDACTONE) 25 MG tablet Take 25 mg by mouth daily.    [provider]  torsemide (DEMADEX) 20 MG tablet Take 1 tablet (20 mg total) by mouth daily. 05/28/23   Sunnie Nielsen, DO     Critical care time: 55 minutes     Harlon Ditty, AGACNP-BC [facility] Prefer epic messenger for cross cover needs If after hours, please call E-link

## 2023-08-16 NOTE — ED Triage Notes (Signed)
67 yo female arrives via [facility] from [facility] for difficulty breathing. She was Dx w/urinalysis UTI 2 days and gotten worst, was Rx'd Cipro for this.  EMS assessment: 86-87 on 4L , Axillaryy 96, BP 50 Map, 110/40, HR 80 BG 119, Altered, Alert x2 (self, palce), via EMS lung sounds diminshed bilaterally CO2 below 25 PmHX:  wears CPAP EMS placed 20 G RFA and given 250 LR

## 2023-08-16 NOTE — ED Provider Notes (Signed)
deficit 7.6 (*)    All other components within normal limits  CBC - Abnormal; Notable for the following components:   RBC 2.42 (*)    Hemoglobin 7.7 (*)    HCT 24.3 (*)    MCV 100.4 (*)    RDW 23.8 (*)    nRBC 0.4 (*)    All other components within normal limits  CREATININE, SERUM - Abnormal; Notable for the following components:   Creatinine, Ser 1.75 (*)    GFR, Estimated 31 (*)    All other components within normal limits  PHOSPHORUS - Abnormal; Notable for the following components:   Phosphorus 4.8 (*)    All other components within normal limits  PROTIME-INR - Abnormal; Notable for the following components:   Prothrombin Time 18.1 (*)    INR 1.5 (*)    All other components within normal limits  HEPARIN LEVEL (UNFRACTIONATED) - Abnormal; Notable for the following components:   Heparin Unfractionated >1.10 (*)    All other components within normal limits  GLUCOSE, CAPILLARY - Abnormal; Notable for the following components:   Glucose-Capillary 116 (*)    All other components within normal limits  TROPONIN I (HIGH SENSITIVITY) - Abnormal; Notable for the following components:   Troponin I (High Sensitivity) 42 (*)    All other components within normal limits  TROPONIN I (HIGH SENSITIVITY) -  Abnormal; Notable for the following components:   Troponin I (High Sensitivity) 42 (*)    All other components within normal limits  SARS CORONAVIRUS 2 BY RT PCR  MRSA NEXT GEN BY PCR, NASAL  CULTURE, BLOOD (ROUTINE X 2)  CULTURE, BLOOD (ROUTINE X 2)  EXPECTORATED SPUTUM ASSESSMENT W GRAM STAIN, RFLX TO RESP C  RESPIRATORY PANEL BY PCR  LACTIC ACID, PLASMA  LACTIC ACID, PLASMA  PROCALCITONIN  MAGNESIUM  GLUCOSE, CAPILLARY  APTT  GLUCOSE, CAPILLARY  URINALYSIS, ROUTINE W REFLEX MICROSCOPIC  STREP PNEUMONIAE URINARY ANTIGEN  LEGIONELLA PNEUMOPHILA SEROGP 1 UR AG  APTT  PREPARE RBC (CROSSMATCH)  TYPE AND SCREEN     EKG Normal sinus rhythm, VR 87. PR 226, QRS 114, QTc 442. No acute ST elevations or depressions. No ischemia or infarct.   RADIOLOGY Chest x-ray: New retrocardiac airspace opacity concerning for pneumonia, right middle lung pneumonia   I also independently reviewed and agree with radiologist interpretations.   PROCEDURES:  Critical Care performed: Yes, see critical care procedure note(s)  .Critical Care  Performed by: Shaune Pollack, MD Authorized by: Shaune Pollack, MD   Critical care provider statement:    Critical care time (minutes):  30   Critical care time was exclusive of:  Separately billable procedures and treating other patients   Critical care was necessary to treat or prevent imminent or life-threatening deterioration of the following conditions:  Cardiac failure, circulatory failure and respiratory failure   Critical care was time spent personally by me on the following activities:  Development of treatment plan with patient or surrogate, discussions with consultants, evaluation of patient's response to treatment, examination of patient, ordering and review of laboratory studies, ordering and review of radiographic studies, ordering and performing treatments and interventions, pulse oximetry, re-evaluation of patient's condition and review of old  charts     MEDICATIONS ORDERED IN ED: Medications  0.9 %  sodium chloride infusion (0 mL/hr Intravenous Hold 08/22/2023 1122)  sodium chloride flush (NS) 0.9 % injection 3 mL (3 mLs Intravenous Given 08/22/2023 1132)  sodium chloride flush (NS) 0.9 % injection 3 mL (has  deficit 7.6 (*)    All other components within normal limits  CBC - Abnormal; Notable for the following components:   RBC 2.42 (*)    Hemoglobin 7.7 (*)    HCT 24.3 (*)    MCV 100.4 (*)    RDW 23.8 (*)    nRBC 0.4 (*)    All other components within normal limits  CREATININE, SERUM - Abnormal; Notable for the following components:   Creatinine, Ser 1.75 (*)    GFR, Estimated 31 (*)    All other components within normal limits  PHOSPHORUS - Abnormal; Notable for the following components:   Phosphorus 4.8 (*)    All other components within normal limits  PROTIME-INR - Abnormal; Notable for the following components:   Prothrombin Time 18.1 (*)    INR 1.5 (*)    All other components within normal limits  HEPARIN LEVEL (UNFRACTIONATED) - Abnormal; Notable for the following components:   Heparin Unfractionated >1.10 (*)    All other components within normal limits  GLUCOSE, CAPILLARY - Abnormal; Notable for the following components:   Glucose-Capillary 116 (*)    All other components within normal limits  TROPONIN I (HIGH SENSITIVITY) - Abnormal; Notable for the following components:   Troponin I (High Sensitivity) 42 (*)    All other components within normal limits  TROPONIN I (HIGH SENSITIVITY) -  Abnormal; Notable for the following components:   Troponin I (High Sensitivity) 42 (*)    All other components within normal limits  SARS CORONAVIRUS 2 BY RT PCR  MRSA NEXT GEN BY PCR, NASAL  CULTURE, BLOOD (ROUTINE X 2)  CULTURE, BLOOD (ROUTINE X 2)  EXPECTORATED SPUTUM ASSESSMENT W GRAM STAIN, RFLX TO RESP C  RESPIRATORY PANEL BY PCR  LACTIC ACID, PLASMA  LACTIC ACID, PLASMA  PROCALCITONIN  MAGNESIUM  GLUCOSE, CAPILLARY  APTT  GLUCOSE, CAPILLARY  URINALYSIS, ROUTINE W REFLEX MICROSCOPIC  STREP PNEUMONIAE URINARY ANTIGEN  LEGIONELLA PNEUMOPHILA SEROGP 1 UR AG  APTT  PREPARE RBC (CROSSMATCH)  TYPE AND SCREEN     EKG Normal sinus rhythm, VR 87. PR 226, QRS 114, QTc 442. No acute ST elevations or depressions. No ischemia or infarct.   RADIOLOGY Chest x-ray: New retrocardiac airspace opacity concerning for pneumonia, right middle lung pneumonia   I also independently reviewed and agree with radiologist interpretations.   PROCEDURES:  Critical Care performed: Yes, see critical care procedure note(s)  .Critical Care  Performed by: Shaune Pollack, MD Authorized by: Shaune Pollack, MD   Critical care provider statement:    Critical care time (minutes):  30   Critical care time was exclusive of:  Separately billable procedures and treating other patients   Critical care was necessary to treat or prevent imminent or life-threatening deterioration of the following conditions:  Cardiac failure, circulatory failure and respiratory failure   Critical care was time spent personally by me on the following activities:  Development of treatment plan with patient or surrogate, discussions with consultants, evaluation of patient's response to treatment, examination of patient, ordering and review of laboratory studies, ordering and review of radiographic studies, ordering and performing treatments and interventions, pulse oximetry, re-evaluation of patient's condition and review of old  charts     MEDICATIONS ORDERED IN ED: Medications  0.9 %  sodium chloride infusion (0 mL/hr Intravenous Hold 08/22/2023 1122)  sodium chloride flush (NS) 0.9 % injection 3 mL (3 mLs Intravenous Given 08/22/2023 1132)  sodium chloride flush (NS) 0.9 % injection 3 mL (has  [facility] Provider Note    Event Date/Time   First MD Initiated Contact with Patient 08/09/2023 [phone]     (approximate)   History   Shortness of Breath   HPI  Sonya Sanchez is a 67 y.o. female  with h/o HTN, HLD, PE, T2DM, morbid obesity, OSA, CHF, her eiwth SOB. Pt reports that over the past few days she has had worsening SOB. Her main complaint is that she "can't breathe." She reportedly has been on ABX (Cipro) for a UTI. Denies any urinary sx. She denies any cough. She just feels like she cannot get enough air. Has been using her CPAP. No other complaints.        Physical Exam   Triage Vital Signs: ED Triage Vitals  Encounter Vitals Group     BP      Systolic BP Percentile      Diastolic BP Percentile      Pulse      Resp      Temp      Temp src      SpO2      Weight      Height      Head Circumference      Peak Flow      Pain Score      Pain Loc      Pain Education      Exclude from Growth Chart     Most recent vital signs: Vitals:   08/15/2023 1400 08/22/2023 1516  BP: (!) 109/46   Pulse: 65 67  Resp: 16 12  Temp:    SpO2: 100% 98%     General: Awake, mild respiratory distress. CV:  Good peripheral perfusion. RRR. No m/r/g. Resp:  Increased WOB with diffuse wheezing, prolonged expiration. Bilateral rales. Diminished aeration b/l. Abd:  No distention. No tenderness. Other:  2+ pitting edema bl LE   ED Results / Procedures / Treatments   Labs (all labs ordered are listed, but only abnormal results are displayed) Labs Reviewed  CBC WITH DIFFERENTIAL/PLATELET - Abnormal; Notable for the following components:      Result Value   RBC 2.49 (*)    Hemoglobin 7.7 (*)    HCT 25.2 (*)    MCV 101.2 (*)    RDW 23.2 (*)    nRBC 0.3 (*)    Lymphs Abs 0.4 (*)    Abs Immature Granulocytes 0.08 (*)    All other components within normal limits  COMPREHENSIVE METABOLIC PANEL - Abnormal; Notable for the following components:   Chloride  97 (*)    BUN 77 (*)    Creatinine, Ser 1.64 (*)    Calcium 8.5 (*)    Albumin 2.9 (*)    Total Bilirubin 1.3 (*)    GFR, Estimated 34 (*)    All other components within normal limits  BRAIN NATRIURETIC PEPTIDE - Abnormal; Notable for the following components:   B Natriuretic Peptide 980.8 (*)    All other components within normal limits  BLOOD GAS, VENOUS - Abnormal; Notable for the following components:   pH, Ven 7.16 (*)    pCO2, Ven 75 (*)    pO2, Ven 61 (*)    Acid-base deficit 3.8 (*)    All other components within normal limits  BLOOD GAS, VENOUS - Abnormal; Notable for the following components:   pH, Ven 7.14 (*)    pCO2, Ven 66 (*)    pO2, Ven 50 (*)    Acid-base  deficit 7.6 (*)    All other components within normal limits  CBC - Abnormal; Notable for the following components:   RBC 2.42 (*)    Hemoglobin 7.7 (*)    HCT 24.3 (*)    MCV 100.4 (*)    RDW 23.8 (*)    nRBC 0.4 (*)    All other components within normal limits  CREATININE, SERUM - Abnormal; Notable for the following components:   Creatinine, Ser 1.75 (*)    GFR, Estimated 31 (*)    All other components within normal limits  PHOSPHORUS - Abnormal; Notable for the following components:   Phosphorus 4.8 (*)    All other components within normal limits  PROTIME-INR - Abnormal; Notable for the following components:   Prothrombin Time 18.1 (*)    INR 1.5 (*)    All other components within normal limits  HEPARIN LEVEL (UNFRACTIONATED) - Abnormal; Notable for the following components:   Heparin Unfractionated >1.10 (*)    All other components within normal limits  GLUCOSE, CAPILLARY - Abnormal; Notable for the following components:   Glucose-Capillary 116 (*)    All other components within normal limits  TROPONIN I (HIGH SENSITIVITY) - Abnormal; Notable for the following components:   Troponin I (High Sensitivity) 42 (*)    All other components within normal limits  TROPONIN I (HIGH SENSITIVITY) -  Abnormal; Notable for the following components:   Troponin I (High Sensitivity) 42 (*)    All other components within normal limits  SARS CORONAVIRUS 2 BY RT PCR  MRSA NEXT GEN BY PCR, NASAL  CULTURE, BLOOD (ROUTINE X 2)  CULTURE, BLOOD (ROUTINE X 2)  EXPECTORATED SPUTUM ASSESSMENT W GRAM STAIN, RFLX TO RESP C  RESPIRATORY PANEL BY PCR  LACTIC ACID, PLASMA  LACTIC ACID, PLASMA  PROCALCITONIN  MAGNESIUM  GLUCOSE, CAPILLARY  APTT  GLUCOSE, CAPILLARY  URINALYSIS, ROUTINE W REFLEX MICROSCOPIC  STREP PNEUMONIAE URINARY ANTIGEN  LEGIONELLA PNEUMOPHILA SEROGP 1 UR AG  APTT  PREPARE RBC (CROSSMATCH)  TYPE AND SCREEN     EKG Normal sinus rhythm, VR 87. PR 226, QRS 114, QTc 442. No acute ST elevations or depressions. No ischemia or infarct.   RADIOLOGY Chest x-ray: New retrocardiac airspace opacity concerning for pneumonia, right middle lung pneumonia   I also independently reviewed and agree with radiologist interpretations.   PROCEDURES:  Critical Care performed: Yes, see critical care procedure note(s)  .Critical Care  Performed by: Shaune Pollack, MD Authorized by: Shaune Pollack, MD   Critical care provider statement:    Critical care time (minutes):  30   Critical care time was exclusive of:  Separately billable procedures and treating other patients   Critical care was necessary to treat or prevent imminent or life-threatening deterioration of the following conditions:  Cardiac failure, circulatory failure and respiratory failure   Critical care was time spent personally by me on the following activities:  Development of treatment plan with patient or surrogate, discussions with consultants, evaluation of patient's response to treatment, examination of patient, ordering and review of laboratory studies, ordering and review of radiographic studies, ordering and performing treatments and interventions, pulse oximetry, re-evaluation of patient's condition and review of old  charts     MEDICATIONS ORDERED IN ED: Medications  0.9 %  sodium chloride infusion (0 mL/hr Intravenous Hold 08/22/2023 1122)  sodium chloride flush (NS) 0.9 % injection 3 mL (3 mLs Intravenous Given 08/22/2023 1132)  sodium chloride flush (NS) 0.9 % injection 3 mL (has

## 2023-08-16 NOTE — Consult Note (Signed)
ANTICOAGULATION CONSULT NOTE  Pharmacy Consult for IV Heparin Indication:  Hx VTE on apixaban at home  Patient Measurements: Height: 5\' 2"  (157.5 cm) Weight: (S) 133.2 kg (293 lb 10.4 oz) (Bed weight) IBW/kg (Calculated) : 50.1 Heparin Dosing Weight: 83.8 kg  Labs: Recent Labs    08/09/2023 0901 08/22/2023 1101  HGB 7.7* 7.7*  HCT 25.2* 24.3*  PLT 236 165  CREATININE 1.64* 1.75*  TROPONINIHS 42* 42*   Estimated Creatinine Clearance: 39.9 mL/min (A) (by C-G formula based on SCr of 1.75 mg/dL (H)).  Medical History: Past Medical History:  Diagnosis Date   Calculus of kidney    Diabetes mellitus without complication (HCC)    DVT (deep venous thrombosis) (HCC)    a. on Eliquis   Family history of early CAD    a. parents passing in their 31's from CAD   Gout, unspecified    Heart murmur    Hyperlipidemia    Hypertension    Morbid obesity (HCC)    Normal cardiac stress test    a. equivocal study, sig soft tissue artifact present, no chest discomfort or ECG changes, perfusion images suggest mod sized region of mild reversible perfusion defect. Findings may be 2/2 shifting soft tissue attenuation, but cannot r/o ischemia, EF 72%   Phlebitis and thrombophlebitis of other deep vessels of lower extremities    Phlebitis and thrombophlebitis of other deep vessels of lower extremities    Pulmonary emboli (HCC)    a. on Eliquis   Spinal stenosis, unspecified region other than cervical    Tobacco abuse    Type II or unspecified type diabetes mellitus without mention of complication, uncontrolled    Unspecified sleep apnea    Urinary tract infection, site not specified    Medications:  Apixaban 5 mg BID prior to admission (last patient reported dose 9/16 at 1600)  Assessment: 67 y/o F with medical history as above and including hx DVT / PE on apixaban who is admitted with acute on chronic respiratory failure in setting of decompensated heart failure and pneumonia requiring  BiPAP.  Baseline aPTT, INR, and heparin level are pending. Baseline H&H notable for anemia that appears worse than baseline with Hgb of 7.7 on presentation versus low to mid 9s in 04/2023.  Patient was on heparin while admitted in 04/2023 and was therapeutic on rate of 1700 units/hr.  Goal of Therapy:  Heparin level 0.3-0.7 units/ml aPTT 66 - 102 seconds Monitor platelets by anticoagulation protocol: Yes   Plan:  --Heparin 4000 unit IV bolus followed by continuous infusion at 1200 units/hr --Check aPTT 6 hours from initiation of infusion & daily HL Follow aPTT given presumed interference of apixaban on anti-Xa level. Switch over to anti-Xa level monitoring only once correlation established --Daily CBC per protocol while on IV heparin  Sonya Sanchez 08/25/2023,2:14 PM

## 2023-08-16 NOTE — ED Notes (Addendum)
EDP notified of worsening VBG.

## 2023-08-16 NOTE — Consult Note (Signed)
PHARMACY - BRIEF ANTIBIOTIC NOTE   Pharmacy has received consult(s) for cefepime and vancomycin from an ED provider. The patient's profile has been reviewed for ht/wt/allergies/indication/available labs.    One time order(s) placed for: cefepime 2 g and vancomycin 2000 mg  Further antibiotics/pharmacy consults should be ordered by admitting physician if indicated.                       Thank you,  Will M. Dareen Piano, PharmD Clinical Pharmacist 08/11/2023 9:43 AM

## 2023-08-16 NOTE — Consult Note (Addendum)
Pharmacy Consult Note - Electrolytes  Sodium (mmol/L)  Date Value  08/26/2023 135  01/09/2018 138   Potassium (mmol/L)  Date Value  08/26/2023 5.1   Magnesium (mg/dL)  Date Value  40/98/1191 1.9   Calcium (mg/dL)  Date Value  47/82/9562 8.5 (L)   Albumin (g/dL)  Date Value  13/06/6577 2.9 (L)  01/09/2018 4.0   Phosphorus (mg/dL)  Date Value  46/96/2952 4.8 (H)    ASSESSMENT: 67 y.o. female with PMH bedbound x 3 years, CAD (chronic total occlusion of RCA), CKD (BL Scr 1.3-1.7), dermatomyositis, chronic RF (2L O2 baseline), OSA on CPAP, obesity, HLD, DVT/PE (2006)  who presents with pneumonia and respiratory failure. She now has worsening acidosis and on Bipap in the CCU with diagnostic labs currently in process. Pharmacy has been consulted to monitor and replace electrolytes. Patient's renal function not wildly out of range with usual serum creatinine.  Lab Results  Component Value Date   CREATININE 1.64 (H) 08/04/2023   CREATININE 1.24 (H) 05/27/2023   CREATININE 1.37 (H) 05/26/2023    mIVF: NS PRN for IV line care  Pertinent medications: Prior to admission meds, these are not currently ordered: Torsemide 20 mg daily Spironolactone 25 mg daily Calcium carbonate (hyperphos management) Amiodarone 200 mg twice daily  Goal of Therapy:  [x]  Electrolytes WNL []  K >=4.0 and Mg >=2.0  PLAN: No electrolyte replacement currently warranted Check electrolytes with next AM labs   Thank you for allowing pharmacy to be a part of this patient's care.  Will M. Dareen Piano, PharmD Clinical Pharmacist 08/12/2023 12:31 PM

## 2023-08-16 NOTE — Consult Note (Addendum)
Pharmacy Antibiotic Note  ASSESSMENT: 67 y.o. female with PMH bedbound, CAD (chronic total occlusion of RCA), dermatomyositis, chronic RF (2L O2 baseline), OSA on CPAP, obesity, HLD, DVT/PE (2006)  is presenting with pneumonia. Pharmacy has been consulted to manage cefepime and vancomycin dosing. She has already received vancomycin 2000 mg and cefepime 2 g as one-time doses in the ED. She now has worsening acidosis and on Bipap in the CCU with diagnostic labs currently in process.  Patient measurements: Height: 5\' 2"  (157.5 cm) Weight: (S) 133.2 kg (293 lb 10.4 oz) (Bed weight) IBW/kg (Calculated) : 50.1  Vital signs: Temp: 97.4 F (36.3 C) (09/17 1120) Temp Source: Axillary (09/17 1120) BP: 107/62 (09/17 1200) Pulse Rate: 67 (09/17 1200) Recent Labs  Lab 08/15/2023 0901 08/22/2023 1101  WBC 7.5 5.7  CREATININE 1.64*  --    Estimated Creatinine Clearance: 42.6 mL/min (A) (by C-G formula based on SCr of 1.64 mg/dL (H)).  Allergies: Allergies  Allergen Reactions   Sulfa Antibiotics Other (See Comments)    Reaction: isn't certain, thinks she ran a fever, or had a rash, maybe both.    Antimicrobials this admission: Cefepime 9/17 >>  Vancomycin 9/17 >>  Dose adjustments this admission: N/A  Microbiology results: 9/17 BCx: sent 9/17 COVID: negative  9/17 MRSA PCR: ordered  PLAN: Initiate cefepime 2 g IV q12H Initiate vancomycin 1750 mg IV q48H with first dose starting tomorrow, 9/18, at 2200 eAUC 512, Cmax 37, Cmin 11 Scr 1.64, IBW, Vd 0.5 Follow up culture results to assess for antibiotic optimization. Monitor renal function to assess for any necessary antibiotic dosing changes.   Thank you for allowing pharmacy to be a part of this patient's care.  Will M. Dareen Piano, PharmD Clinical Pharmacist 08/09/2023 12:16 PM

## 2023-08-16 NOTE — Plan of Care (Signed)
  Problem: Education: Goal: Understanding of CV disease, CV risk reduction, and recovery process will improve Outcome: Progressing   Problem: Cardiovascular: Goal: Ability to achieve and maintain adequate cardiovascular perfusion will improve Outcome: Progressing   Problem: Health Behavior/Discharge Planning: Goal: Ability to safely manage health-related needs after discharge will improve Outcome: Progressing   Problem: Education: Goal: Knowledge of General Education information will improve Description: Including pain rating scale, medication(s)/side effects and non-pharmacologic comfort measures Outcome: Progressing   Problem: Health Behavior/Discharge Planning: Goal: Ability to manage health-related needs will improve Outcome: Progressing   Problem: Clinical Measurements: Goal: Ability to maintain clinical measurements within normal limits will improve Outcome: Progressing Goal: Diagnostic test results will improve Outcome: Progressing Goal: Cardiovascular complication will be avoided Outcome: Progressing   Problem: Coping: Goal: Level of anxiety will decrease Outcome: Progressing   Problem: Elimination: Goal: Will not experience complications related to bowel motility Outcome: Progressing Goal: Will not experience complications related to urinary retention Outcome: Progressing   Problem: Pain Managment: Goal: General experience of comfort will improve Outcome: Progressing   Problem: Safety: Goal: Ability to remain free from injury will improve Outcome: Progressing   Problem: Skin Integrity: Goal: Risk for impaired skin integrity will decrease Outcome: Progressing

## 2023-08-17 ENCOUNTER — Inpatient Hospital Stay: Payer: Medicare Other

## 2023-08-17 DIAGNOSIS — J9602 Acute respiratory failure with hypercapnia: Secondary | ICD-10-CM | POA: Diagnosis not present

## 2023-08-17 LAB — GLUCOSE, CAPILLARY
Glucose-Capillary: 281 mg/dL — ABNORMAL HIGH (ref 70–99)
Glucose-Capillary: 252 mg/dL — ABNORMAL HIGH (ref 70–99)
Glucose-Capillary: 199 mg/dL — ABNORMAL HIGH (ref 70–99)

## 2023-08-17 LAB — BASIC METABOLIC PANEL WITH GFR
Anion gap: 22 — ABNORMAL HIGH (ref 5–15)
BUN: 93 mg/dL — ABNORMAL HIGH (ref 8–23)
CO2: 19 mmol/L — ABNORMAL LOW (ref 22–32)
Calcium: 7.8 mg/dL — ABNORMAL LOW (ref 8.9–10.3)
Chloride: 95 mmol/L — ABNORMAL LOW (ref 98–111)
Creatinine, Ser: 2.23 mg/dL — ABNORMAL HIGH (ref 0.44–1.00)
GFR, Estimated: 23 mL/min — ABNORMAL LOW (ref >60)
Glucose, Bld: 284 mg/dL — ABNORMAL HIGH (ref 70–99)
Potassium: 5.2 mmol/L — ABNORMAL HIGH (ref 3.5–5.1)
Sodium: 136 mmol/L (ref 135–145)

## 2023-08-17 LAB — BLOOD GAS, VENOUS
Acid-base deficit: 11.6 mmol/L — ABNORMAL HIGH (ref 0.0–2.0)
Acid-base deficit: 6.8 mmol/L — ABNORMAL HIGH (ref 0.0–2.0)
Bicarbonate: 16.4 mmol/L — ABNORMAL LOW (ref 20.0–28.0)
Bicarbonate: 20.2 mmol/L (ref 20.0–28.0)
O2 Saturation: 89.7 %
O2 Saturation: 82.8 %
Patient temperature: 37
Patient temperature: 37
pCO2, Ven: 44 mmHg (ref 44–60)
pCO2, Ven: 45 mmHg (ref 44–60)
pH, Ven: 7.18 — CL (ref 7.25–7.43)
pH, Ven: 7.26 (ref 7.25–7.43)
pO2, Ven: 59 mmHg — ABNORMAL HIGH (ref 32–45)
pO2, Ven: 50 mmHg — ABNORMAL HIGH (ref 32–45)

## 2023-08-17 LAB — APTT: aPTT: 88 s — ABNORMAL HIGH (ref 24–36)

## 2023-08-17 LAB — PREPARE RBC (CROSSMATCH)

## 2023-08-17 MED ORDER — INSULIN ASPART 100 UNIT/ML IV SOLN
10.0000 [IU] | Freq: Once | INTRAVENOUS | Status: AC
  Administered 2023-08-17: 10 [IU] via INTRAVENOUS
  Filled 2023-08-17: qty 0.1

## 2023-08-17 MED ORDER — POLYVINYL ALCOHOL 1.4 % OP SOLN: 1.0000 [drp] | Freq: Four times a day (QID) | OPHTHALMIC | Status: DC | PRN

## 2023-08-17 MED ORDER — SODIUM CHLORIDE 0.9 % IV SOLN: INTRAVENOUS | Status: DC

## 2023-08-17 MED ORDER — LORAZEPAM 2 MG/ML IJ SOLN: 2.0000 mg | INTRAMUSCULAR | Status: DC | PRN

## 2023-08-17 MED ORDER — MORPHINE 100MG IN NS 100ML (1MG/ML) PREMIX INFUSION
0.0000 mg/h | INTRAVENOUS | Status: DC
  Administered 2023-08-17: 5 mg/h via INTRAVENOUS
  Filled 2023-08-17: qty 100

## 2023-08-17 MED ORDER — GLYCOPYRROLATE 0.2 MG/ML IJ SOLN: 0.2000 mg | INTRAMUSCULAR | Status: DC | PRN

## 2023-08-17 MED ORDER — MORPHINE SULFATE (PF) 2 MG/ML IV SOLN
1.0000 mg | INTRAVENOUS | Status: DC | PRN
  Administered 2023-08-17: 2 mg via INTRAVENOUS
  Filled 2023-08-17: qty 1

## 2023-08-17 MED ORDER — ORAL CARE MOUTH RINSE
15.0000 mL | OROMUCOSAL | Status: DC
  Administered 2023-08-17 (×3): 15 mL via OROMUCOSAL

## 2023-08-17 MED ORDER — ORAL CARE MOUTH RINSE: 15.0000 mL | OROMUCOSAL | Status: DC | PRN

## 2023-08-17 MED ORDER — DEXTROSE 50 % IV SOLN
1.0000 | Freq: Once | INTRAVENOUS | Status: AC
  Administered 2023-08-17: 50 mL via INTRAVENOUS
  Filled 2023-08-17: qty 50

## 2023-08-17 MED ORDER — STERILE WATER FOR INJECTION IV SOLN
INTRAVENOUS | Status: DC
  Filled 2023-08-17: qty 1000
  Filled 2023-08-17 (×2): qty 150

## 2023-08-17 MED ORDER — SODIUM BICARBONATE 8.4 % IV SOLN
150.0000 meq | Freq: Once | INTRAVENOUS | Status: AC
  Administered 2023-08-17: 150 meq via INTRAVENOUS
  Filled 2023-08-17: qty 100

## 2023-08-17 MED ORDER — SODIUM BICARBONATE 8.4 % IV SOLN
INTRAVENOUS | Status: AC
  Filled 2023-08-17: qty 50

## 2023-08-17 MED ORDER — SODIUM BICARBONATE 8.4 % IV SOLN
50.0000 meq | Freq: Once | INTRAVENOUS | Status: AC
  Administered 2023-08-17: 50 meq via INTRAVENOUS
  Filled 2023-08-17: qty 50

## 2023-08-17 MED ORDER — ACETAMINOPHEN 325 MG PO TABS: 650.0000 mg | ORAL_TABLET | Freq: Four times a day (QID) | ORAL | Status: DC | PRN

## 2023-08-17 MED ORDER — MORPHINE BOLUS VIA INFUSION
5.0000 mg | INTRAVENOUS | Status: DC | PRN
  Administered 2023-08-17 (×2): 5 mg via INTRAVENOUS

## 2023-08-17 NOTE — Consult Note (Signed)
Pharmacy Consult Note - Electrolytes  Sodium (mmol/L)  Date Value  08/17/2023 136  01/09/2018 138   Potassium (mmol/L)  Date Value  08/17/2023 5.2 (H)   Magnesium (mg/dL)  Date Value  78/29/5621 1.9   Calcium (mg/dL)  Date Value  30/86/5784 7.8 (L)   Albumin (g/dL)  Date Value  69/62/9528 2.7 (L)  01/09/2018 4.0   Phosphorus (mg/dL)  Date Value  41/32/4401 5.8 (H)    ASSESSMENT: 67 y.o. female with PMH bedbound x 3 years, CAD (chronic total occlusion of RCA), CKD (BL Scr 1.3-1.7), dermatomyositis, chronic RF (2L O2 baseline), OSA on CPAP, obesity, HLD, DVT/PE (2006)  who presents with pneumonia and respiratory failure. She now has worsening acidosis and on Bipap in the CCU with diagnostic labs currently in process. Pharmacy has been consulted to monitor and replace electrolytes. Patient's renal function not wildly out of range with usual serum creatinine.  Lab Results  Component Value Date   CREATININE 2.23 (H) 08/17/2023   CREATININE 1.92 (H) 08/17/2023   CREATININE 1.75 (H) 08/05/2023    mIVF: NS PRN for IV line care  Pertinent medications: Prior to admission meds, these are not currently ordered: Torsemide 20 mg daily Spironolactone 25 mg daily Calcium carbonate (hyperphos management) Amiodarone 200 mg twice daily  Goal of Therapy:  [x]  Electrolytes WNL []  K >=4.0 and Mg >=2.0  PLAN: K 5.2: insulin aspart 10 units IV x 1 given Phos 5.8: being managed by CCM team(no pharmacological intervention planned currently) Check electrolytes with next AM labs   Thank you for allowing pharmacy to be a part of this patient's care.  Bettey Costa, PharmD Clinical Pharmacist 08/17/2023 2:19 PM

## 2023-08-17 NOTE — Consult Note (Signed)
ANTICOAGULATION CONSULT NOTE  Pharmacy Consult for IV Heparin Indication:  Hx VTE on apixaban at home  Patient Measurements: Height: 5\' 2"  (157.5 cm) Weight: 133.2 kg (293 lb 10.4 oz) IBW/kg (Calculated) : 50.1 Heparin Dosing Weight: 83.8 kg  Labs: Recent Labs    08/26/2023 0901 08/04/2023 1101 08/15/2023 1439 08/28/2023 2225 08/17/23 0455  HGB 7.7* 7.7*  --   --  7.3*  HCT 25.2* 24.3*  --   --  23.6*  PLT 236 165  --   --  278  APTT  --   --  28 84* 88*  LABPROT  --   --  18.1*  --   --   INR  --   --  1.5*  --   --   HEPARINUNFRC  --   --  >1.10*  --  >1.10*  CREATININE 1.64* 1.75*  --   --   --   TROPONINIHS 42* 42*  --   --   --    Estimated Creatinine Clearance: 39.9 mL/min (A) (by C-G formula based on SCr of 1.75 mg/dL (H)).  Medical History: Past Medical History:  Diagnosis Date   Calculus of kidney    Diabetes mellitus without complication (HCC)    DVT (deep venous thrombosis) (HCC)    a. on Eliquis   Family history of early CAD    a. parents passing in their 59's from CAD   Gout, unspecified    Heart murmur    Hyperlipidemia    Hypertension    Morbid obesity (HCC)    Normal cardiac stress test    a. equivocal study, sig soft tissue artifact present, no chest discomfort or ECG changes, perfusion images suggest mod sized region of mild reversible perfusion defect. Findings may be 2/2 shifting soft tissue attenuation, but cannot r/o ischemia, EF 72%   Phlebitis and thrombophlebitis of other deep vessels of lower extremities    Phlebitis and thrombophlebitis of other deep vessels of lower extremities    Pulmonary emboli (HCC)    a. on Eliquis   Spinal stenosis, unspecified region other than cervical    Tobacco abuse    Type II or unspecified type diabetes mellitus without mention of complication, uncontrolled    Unspecified sleep apnea    Urinary tract infection, site not specified    Medications:  Apixaban 5 mg BID prior to admission (last patient reported dose  9/16 at 1600)  Assessment: 67 y/o F with medical history as above and including hx DVT / PE on apixaban who is admitted with acute on chronic respiratory failure in setting of decompensated heart failure and pneumonia requiring BiPAP.  Baseline aPTT, INR, and heparin level are pending. Baseline H&H notable for anemia that appears worse than baseline with Hgb of 7.7 on presentation versus low to mid 9s in 04/2023.  Patient was on heparin while admitted in 04/2023 and was therapeutic on rate of 1700 units/hr.  Goal of Therapy:  Heparin level 0.3-0.7 units/ml aPTT 66 - 102 seconds Monitor platelets by anticoagulation protocol: Yes  9/17 2225 aPTT 84, therapeutic x 1 9/18 0455 aPTT 88, therapeutic x 2 / HL >1.1  Plan:  -- Continue heparin infusion at 1200 units/hr -- Recheck aPTT w/ AM labs daily while therapeutic & daily HL Follow aPTT given presumed interference of apixaban on anti-Xa level. Switch over to anti-Xa level monitoring only once correlation established --Daily CBC per protocol while on IV heparin  Otelia Sergeant, PharmD, [facility] 08/17/2023 5:25 AM

## 2023-08-17 NOTE — IPAL (Addendum)
  Interdisciplinary Goals of Care Family Meeting   Date carried out: 08/17/2023  Location of the meeting: Bedside  Member's involved: Physician, Nurse Practitioner, Bedside Registered Nurse, and Family Member or next of kin  PATIENT SEEN TODAY SHE IS OFF BIPAP SHE IS ALERT AND AWAKE, KNOWS NAME AND DOB FOLLOWING COMMANDS  SHE UNDERSTANDS AND IS AWARE OF HER HEART CONDITION SHE UNDERSTANDS HER KIDNEYS ARE FAILING SHE DOES NOT WANT HEMODIALYSIS AND SHE IS TIRED SHE HAS RELAYED TO NEPHROLOGY THAT SHE DOES NOT WANT HEMODIALYSIS   TIFFANY, HER NIECE WAS AT BEDSIDE WHEN PATIENT EXPRESSED THAT PATIENT DOES NOT WANT HEMODIALYSIS. PATIENT DOES NOT WANT IT FOR SHORT PERIOD OF TIME. SHE ABSOLUTELY REFUSES HEMODIALYSIS  I HAVE EXPLAINED TO NIECE TIFFANY THAT  ICU TEAM CAN NOT AND WILL NOT GO AGAINST PATIENTS WISHES.   SISTER AT BEDSIDE, SHE HAS ALSO EXPRESSED THAT PATIENT WOULD NOT WANT TO BE PLACED ON MACHINES, SHE IS TIRED AND SHE DOES NOT WANT TO SUFFER ANYMORE  WE WILL PLAN FOR COMFORT CARE MEASURES WHEN FAMILY ARRIVES PATIENT IS NOW DNR/DNI  Additional CC time 35 mins   Garlin Batdorf Santiago Glad, M.D.  [facility]  Medical Director [facility] Medical Director [facility] Cardio-Pulmonary Department

## 2023-08-17 NOTE — IPAL (Signed)
  Interdisciplinary Goals of Care Family Meeting   Date carried out: 08/17/2023  Location of the meeting: Unit  Member's involved: Nurse Practitioner, Bedside Registered Nurse, and Family Member or next of kin  Durable Power of Attorney or acting medical decision maker: Pt Sonya Sanchez     Discussion: We discussed goals of care for Sonya Sanchez . Sonya Sanchez is ready to transition to Comfort Measures Only   Code status:   Code Status: Limited: Do not attempt resuscitation (DNR) -DNR-LIMITED -Do Not Intubate/DNI    Disposition: In-patient comfort care  Time spent for the meeting: 10 minutes     Zada Girt, AGNP  Pulmonary/Critical Care Pager [phone] (please enter 7 digits) PCCM Consult Pager [phone] (please enter 7 digits)

## 2023-08-17 NOTE — Consult Note (Signed)
ANTICOAGULATION CONSULT NOTE  Pharmacy Consult for IV Heparin Indication:  Hx VTE on apixaban at home  Patient Measurements: Height: 5\' 2"  (157.5 cm) Weight: (S) 133.2 kg (293 lb 10.4 oz) (Bed weight) IBW/kg (Calculated) : 50.1 Heparin Dosing Weight: 83.8 kg  Labs: Recent Labs    08/25/2023 0901 08/23/2023 1101 08/05/2023 1439 08/28/2023 2225  HGB 7.7* 7.7*  --   --   HCT 25.2* 24.3*  --   --   PLT 236 165  --   --   APTT  --   --  28 84*  LABPROT  --   --  18.1*  --   INR  --   --  1.5*  --   HEPARINUNFRC  --   --  >1.10*  --   CREATININE 1.64* 1.75*  --   --   TROPONINIHS 42* 42*  --   --    Estimated Creatinine Clearance: 39.9 mL/min (A) (by C-G formula based on SCr of 1.75 mg/dL (H)).  Medical History: Past Medical History:  Diagnosis Date   Calculus of kidney    Diabetes mellitus without complication (HCC)    DVT (deep venous thrombosis) (HCC)    a. on Eliquis   Family history of early CAD    a. parents passing in their 61's from CAD   Gout, unspecified    Heart murmur    Hyperlipidemia    Hypertension    Morbid obesity (HCC)    Normal cardiac stress test    a. equivocal study, sig soft tissue artifact present, no chest discomfort or ECG changes, perfusion images suggest mod sized region of mild reversible perfusion defect. Findings may be 2/2 shifting soft tissue attenuation, but cannot r/o ischemia, EF 72%   Phlebitis and thrombophlebitis of other deep vessels of lower extremities    Phlebitis and thrombophlebitis of other deep vessels of lower extremities    Pulmonary emboli (HCC)    a. on Eliquis   Spinal stenosis, unspecified region other than cervical    Tobacco abuse    Type II or unspecified type diabetes mellitus without mention of complication, uncontrolled    Unspecified sleep apnea    Urinary tract infection, site not specified    Medications:  Apixaban 5 mg BID prior to admission (last patient reported dose 9/16 at 1600)  Assessment: 67 y/o F with  medical history as above and including hx DVT / PE on apixaban who is admitted with acute on chronic respiratory failure in setting of decompensated heart failure and pneumonia requiring BiPAP.  Baseline aPTT, INR, and heparin level are pending. Baseline H&H notable for anemia that appears worse than baseline with Hgb of 7.7 on presentation versus low to mid 9s in 04/2023.  Patient was on heparin while admitted in 04/2023 and was therapeutic on rate of 1700 units/hr.  Goal of Therapy:  Heparin level 0.3-0.7 units/ml aPTT 66 - 102 seconds Monitor platelets by anticoagulation protocol: Yes  9/17 2225 aPTT 84, therapeutic x 1  Plan:  -- Continue heparin infusion at 1200 units/hr -- Recheck aPTT w/ AM labs to confirm & daily HL Follow aPTT given presumed interference of apixaban on anti-Xa level. Switch over to anti-Xa level monitoring only once correlation established --Daily CBC per protocol while on IV heparin  Otelia Sergeant, PharmD, MBA 08/17/2023 1:00 AM

## 2023-08-17 NOTE — Progress Notes (Signed)
[facility]  ROUNDING NOTE   Subjective:   [name] was admitted to [facility] on September 12, 2023 for Respiratory failure [facility]) [J96.90]  Patient has been having progressive shortness of breath for last 3-4 days. Patient recently had completed course of ciprofloxacin for urinary tract infection.   Patient diagnosed with pneumonia (HCAP) and brought to to ICU. Placed on BIPAP and norepinephrine.   Nephrology consulted for acute kidney injury on chronic kidney disease stage IIIA, hyperkalemia, acute anion gap metabolic acidosis secondary to DKA and hypotension.   Patient nonoliguric.   She states she does not want dialysis. She was last seen by my partner, Dr. Thedore Mins on 07/28/23 in the office.   Objective:  Vital signs in last 24 hours:  Temp:  [96.8 F (36 C)-98.6 F (37 C)] 98.6 F (37 C) (09/18 0800) Pulse Rate:  [63-94] 81 (09/18 1000) Resp:  [12-27] 17 (09/18 1000) BP: (85-122)/(24-67) 103/26 (09/18 1000) SpO2:  [96 %-100 %] 98 % (09/18 1000) FiO2 (%):  [40 %-50 %] 40 % (09/18 0754) Weight:  [133.2 kg] 133.2 kg (09/18 0341)  Weight change:  Filed Weights   09-12-2023 0907 08/17/23 0341  Weight: (S) 133.2 kg 133.2 kg    Intake/Output: I/O last 3 completed shifts: In: 1332.6 [I.V.:555; IV Piggyback:777.6] Out: 800 [Urine:800]   Intake/Output this shift:  Total I/O In: 462.8 [I.V.:431.1; IV Piggyback:31.7] Out: -   Physical Exam: General: Critically ill  Head: BIPAP   Eyes: Anicteric, PERRL  Neck: Supple, trachea midline  Lungs:  BIPAP Fio2 40%.   Heart: Regular rate and rhythm  Abdomen:  Soft, nontender, obese  Extremities:  +++ peripheral edema.  Neurologic: Nonfocal, moving all four extremities  Skin: No lesions  Access: none    Basic Metabolic Panel: Recent Labs  Lab 09/12/2023 0901 September 12, 2023 1101 08/17/23 0455  NA 135  --  135  K 5.1  --  5.8*  CL 97*  --  96*  CO2 23  --  16*  GLUCOSE 88  --  206*  BUN 77*  --  95*  CREATININE 1.64*  1.75* 1.92*  CALCIUM 8.5*  --  8.3*  MG 1.9  --  1.9  PHOS 4.8*  --  5.8*    Liver Function Tests: Recent Labs  Lab 09-12-2023 0901 08/17/23 0455  AST 16  --   ALT 13  --   ALKPHOS 54  --   BILITOT 1.3*  --   PROT 6.7  --   ALBUMIN 2.9* 2.7*   No results for input(s): "LIPASE", "AMYLASE" in the last 168 hours. No results for input(s): "AMMONIA" in the last 168 hours.  CBC: Recent Labs  Lab 12-Sep-2023 0901 Sep 12, 2023 1101 08/17/23 0455  WBC 7.5 5.7 6.8  NEUTROABS 6.6  --   --   HGB 7.7* 7.7* 7.3*  HCT 25.2* 24.3* 23.6*  MCV 101.2* 100.4* 101.7*  PLT 236 165 278    Cardiac Enzymes: No results for input(s): "CKTOTAL", "CKMB", "CKMBINDEX", "TROPONINI" in the last 168 hours.  BNP: Invalid input(s): "POCBNP"  CBG: Recent Labs  Lab 09/12/2023 1221 09-12-23 1435 September 12, 2023 1509 2023/09/12 2007 08/17/23 0728  GLUCAP 82 90 116* 145* 281*    Microbiology: Results for orders placed or performed during the hospital encounter of 12-Sep-2023  Blood culture (routine x 2)     Status: None (Preliminary result)   Collection Time: Sep 12, 2023  9:02 AM   Specimen: BLOOD  Result Value Ref Range Status   Specimen Description BLOOD  BLOOD LEFT FOREARM  Final   Special Requests   Final    BOTTLES DRAWN AEROBIC AND ANAEROBIC Blood Culture results may not be optimal due to an excessive volume of blood received in culture bottles   Culture   Final    NO GROWTH < 24 HOURS Performed at [facility], [address], [place], [place] [address]    Report Status PENDING  Incomplete  SARS Coronavirus 2 by RT PCR (hospital order, performed in [facility] hospital lab) *cepheid single result test* Anterior Nasal Swab     Status: None   Collection Time: 09-05-23  9:02 AM   Specimen: Anterior Nasal Swab  Result Value Ref Range Status   SARS Coronavirus 2 by RT PCR NEGATIVE NEGATIVE Final    Comment: (NOTE) SARS-CoV-2 target nucleic acids are NOT DETECTED.  The SARS-CoV-2 RNA is generally  detectable in upper and lower respiratory specimens during the acute phase of infection. The lowest concentration of SARS-CoV-2 viral copies this assay can detect is 250 copies / mL. A negative result does not preclude SARS-CoV-2 infection and should not be used as the sole basis for treatment or other patient management decisions.  A negative result may occur with improper specimen collection / handling, submission of specimen other than nasopharyngeal swab, presence of viral mutation(s) within the areas targeted by this assay, and inadequate number of viral copies (<250 copies / mL). A negative result must be combined with clinical observations, patient history, and epidemiological information.  Fact Sheet for Patients:   RoadLapTop.co.za  Fact Sheet for Healthcare Providers: http://kim-miller.com/  This test is not yet approved or  cleared by the [place] FDA and has been authorized for detection and/or diagnosis of SARS-CoV-2 by FDA under an Emergency Use Authorization (EUA).  This EUA will remain in effect (meaning this test can be used) for the duration of the COVID-19 declaration under Section 564(b)(1) of the Act, 21 U.S.C. section 360bbb-3(b)(1), unless the authorization is terminated or revoked sooner.  Performed at [facility], [address] [place], [place] [address]   Blood culture (routine x 2)     Status: None (Preliminary result)   Collection Time: 09-05-23  9:07 AM   Specimen: BLOOD  Result Value Ref Range Status   Specimen Description BLOOD BLOOD RIGHT ARM  Final   Special Requests   Final    BOTTLES DRAWN AEROBIC AND ANAEROBIC Blood Culture results may not be optimal due to an excessive volume of blood received in culture bottles   Culture   Final    NO GROWTH < 24 HOURS Performed at [facility], [address], [place], [place] [address]    Report Status PENDING  Incomplete  MRSA Next  Gen by PCR, Nasal     Status: None   Collection Time: 2023-09-05 12:30 PM   Specimen: Nasal Mucosa; Nasal Swab  Result Value Ref Range Status   MRSA by PCR Next Gen NOT DETECTED NOT DETECTED Final    Comment: (NOTE) The GeneXpert MRSA Assay (FDA approved for NASAL specimens only), is one component of a comprehensive MRSA colonization surveillance program. It is not intended to diagnose MRSA infection nor to guide or monitor treatment for MRSA infections. Test performance is not FDA approved in patients less than 61 years old. Performed at [facility], [address], [place], [place] [address]     Coagulation Studies: Recent Labs    09/05/2023 1439  LABPROT 18.1*  INR 1.5*  Urinalysis: Recent Labs    08/05/2023 1620  COLORURINE YELLOW*  LABSPEC 1.011  PHURINE 5.0  GLUCOSEU NEGATIVE  HGBUR LARGE*  BILIRUBINUR NEGATIVE  KETONESUR 5*  PROTEINUR 30*  NITRITE NEGATIVE  LEUKOCYTESUR MODERATE*      Imaging: DG Chest Port 1 View  Result Date: 08/17/2023 CLINICAL DATA:  Acute respiratory failure with hypoxia EXAM: PORTABLE CHEST - 1 VIEW COMPARISON:  08/12/2023 FINDINGS: Moderate right and smaller left pleural effusions. Some increase in right mid lung atelectasis or infiltrate. Perihilar vascular congestion. Heart size and mediastinal contours are within normal limits. Visualized bones unremarkable. IMPRESSION: Bilateral pleural effusions, right greater than left. Electronically Signed   By: Corlis Leak M.D.   On: 08/17/2023 10:12   DG Chest Portable 1 View  Result Date: 08/25/2023 CLINICAL DATA:  SOB.  Difficulty breathing. EXAM: PORTABLE CHEST 1 VIEW COMPARISON:  05/19/2023. FINDINGS: Mild pulmonary vascular congestion, which may be accentuated by low lung volume. There is a new left retrocardiac airspace opacity obscuring the left hemidiaphragm, descending thoracic aorta and blunting the left lateral costophrenic angle suggesting combination of left lower lobe  atelectasis and/or consolidation with pleural effusion. There is also increasing triangular opacity overlying the right mid lower lung zone, which may represent combination of atelectasis and/or pneumonia. There is probable trace right pleural effusion also. Stable cardio-mediastinal silhouette. No acute osseous abnormalities. The soft tissues are within normal limits. IMPRESSION: 1. New left retrocardiac airspace opacity suggesting combination of left lower lobe atelectasis and/or consolidation with pleural effusion. 2. Increasing triangular opacity overlying the right mid lower lung zone, which may represent combination of atelectasis and/or pneumonia. Probable trace right pleural effusion. Electronically Signed   By: Jules Schick M.D.   On: 08/27/2023 10:21     Medications:    sodium chloride Stopped (08/10/2023 1122)   sodium chloride     sodium chloride Stopped (08/14/2023 1949)   heparin 1,200 Units/hr (08/17/23 1000)   norepinephrine (LEVOPHED) Adult infusion 10 mcg/min (08/17/23 1000)   piperacillin-tazobactam (ZOSYN)  IV Stopped (08/17/23 0939)   sodium bicarbonate 150 mEq in sterile water 1,150 mL infusion 125 mL/hr at 08/17/23 1000   vancomycin      Chlorhexidine Gluconate Cloth  6 each Topical Daily   insulin aspart  0-20 Units Subcutaneous TID WC   midodrine  5 mg Oral TID WC   mouth rinse  15 mL Mouth Rinse 4 times per day   sodium chloride flush  3 mL Intravenous Q12H   sodium chloride, ondansetron (ZOFRAN) IV, mouth rinse, sodium chloride flush  Assessment/ Plan:  [name] is a 67 y.o.  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  - holding spironolactone and torsemide - history of AKI with SGLT-2 inhibitor and ARB. Continue to hold.  - Nonoliguric urine output. No acute indication for renal replacement therapy. Patient does not want dialysis. - bicarbonate gtt.   Acute respiratory failure: requiring noninvasive ventilation. Secondary to acute exacerbation of COPD, acute exacerbation of chronic diastolic congestive heart failure and HCAP.  - Do reconsider pip/tazo and vanco which can independently cause acute kidney injury - appreciate pulmonary input.   Hypotension and sepsis: requiring  vasopressors.  - norepinephrine  Diabetes mellitus type II insulin dependent: with diabetic ketoacidosis: acute anion gap metabolic acidosis - DKA regimen   LOS: 1 Sonya Sanchez 9/18/202410:19 AM

## 2023-08-17 NOTE — Inpatient Diabetes Management (Signed)
Inpatient Diabetes Program Recommendations  AACE/ADA: New Consensus Statement on Inpatient Glycemic Control (2015)  Target Ranges:  Prepandial:   less than 140 mg/dL      Peak postprandial:   less than 180 mg/dL (1-2 hours)      Critically ill patients:  140 - 180 mg/dL   Lab Results  Component Value Date   GLUCAP 281 (H) 08/17/2023   HGBA1C 9.1 (H) 04/30/2023    Review of Glycemic Control  Latest Reference Range & Units 08/29/2023 12:21 08/26/2023 14:35 08/07/2023 15:09 08/24/2023 20:07 08/17/23 07:28  Glucose-Capillary 70 - 99 mg/dL 82 90 191 (H) 478 (H) 295 (H)  (H): Data is abnormally high  Diabetes history: DM2 Outpatient Diabetes medications:  Lantus 40 units at bedtime Fiasp 4-21 units TID Humalog 12 units TID  Trulicity 4.5 mg weekly Current orders for Inpatient glycemic control:  Novolog 0-20 units TID  Inpatient Diabetes Program Recommendations:    Novolog 0-15 units Q4H while NPO Semglee 20 units every day (50 % of home dose)  Will continue to follow while inpatient.  Thank you, Dulce Sellar, MSN, CDCES Diabetes Coordinator Inpatient Diabetes Program [phone] (team pager from 8a-5p)

## 2023-08-17 NOTE — Plan of Care (Signed)
  Problem: Education: Goal: Understanding of CV disease, CV risk reduction, and recovery process will improve Outcome: Progressing   Problem: Health Behavior/Discharge Planning: Goal: Ability to safely manage health-related needs after discharge will improve Outcome: Progressing   Problem: Education: Goal: Knowledge of General Education information will improve Description: Including pain rating scale, medication(s)/side effects and non-pharmacologic comfort measures Outcome: Progressing   Problem: Health Behavior/Discharge Planning: Goal: Ability to manage health-related needs will improve Outcome: Progressing   Problem: Clinical Measurements: Goal: Ability to maintain clinical measurements within normal limits will improve Outcome: Progressing Goal: Respiratory complications will improve Outcome: Progressing Goal: Cardiovascular complication will be avoided Outcome: Progressing   Problem: Elimination: Goal: Will not experience complications related to urinary retention Outcome: Progressing   Problem: Pain Managment: Goal: General experience of comfort will improve Outcome: Progressing   Problem: Safety: Goal: Ability to remain free from injury will improve Outcome: Progressing   Problem: Skin Integrity: Goal: Risk for impaired skin integrity will decrease Outcome: Progressing

## 2023-08-17 NOTE — Progress Notes (Signed)
NAME:  Kymoni Griebel, MRN:  [id number], DOB:  Jun 08, 1956, LOS: 1 ADMISSION DATE:  08/19/2023, CONSULTATION DATE:  08/03/2023 REFERRING MD:  Dr. Erma Heritage, CHIEF COMPLAINT:  Shortness of breath   Brief Pt Description / Synopsis:  67 y.o. female admitted with Acute on Chronic Hypoxic & Hypercapnic Respiratory Failure in the setting of Acute Decompensated HFpEF and suspected HCAP requiring BiPAP, will treat for pneumonia and septic shock now on pressors, with acute renal failure  History of Present Illness:  Callahan Crossett is a 67 y.o. female with a past medical history significant for dermatomyositis, chronic respiratory failure on 2L O2, HFpEF, severe aortic stenosis, obesity, hyperlipidemia, OSA on CPAP, hx of DVT/PE (2006), bedbound (has not walked in 3 years), morbid obesity, HTN, CAD (RCA CTO) who presented to [facility] ED on 08/04/2023 from [facility] due to complaints of progressive shortness of breath over the past few days.  Upon presentation to the ED she was stating that she "can't breathe".  She denies any dizziness, chest pain, palpitations, cough, wheezing, fever, chills, abdominal pain, nausea, vomiting, diarrhea.  She reportedly has been taking ciprofloxacin for recent diagnosis of UTI about 2 days ago, currently denies any dysuria or other urinary symptoms.  Reports she has been compliant with her CPAP at home.  Of note she was admitted at [facility] from 05/13/23 through 05/27/23 for treatment of Acute on Chronic Hypoxic and Hypercapnic Respiratory Failure in the setting of Acute Decompensated HFpEF and Severe Sepsis from MRSA Pneumonia and E.Coli Urosepsis.  ED Course: Initial Vital Signs: Temperature 97.2 F axillary, respiratory rate 19, pulse 86, blood pressure 103/47, SpO2 86% on 4L  Significant Labs: BUN 77, creatinine 1.64, BNP 980, high-sensitivity troponin 42, lactic acid 0.6, procalcitonin 0.26, WBC 7.5, hemoglobin 7.7 VBG: pH 7.16/pCO2 75/pO2 61/bicarb  26.7 COVID-19 PCR is negative Imaging Chest X-ray>>IMPRESSION: 1. New left retrocardiac airspace opacity suggesting combination of left lower lobe atelectasis and/or consolidation with pleural effusion. 2. Increasing triangular opacity overlying the right mid lower lung zone, which may represent combination of atelectasis and/or pneumonia. Probable trace right pleural effusion. Medications Administered: IV Azithromycin, Cefepime, and Vancomycin, Duonebs x3, 125 mg Solumedrol  PCCM asked to admit for further workup and treatment.  Please see "significant hospital events" section below for full detailed hospital course.   Pertinent  Medical History   Past Medical History:  Diagnosis Date   Calculus of kidney    Diabetes mellitus without complication (HCC)    DVT (deep venous thrombosis) (HCC)    a. on Eliquis   Family history of early CAD    a. parents passing in their 52's from CAD   Gout, unspecified    Heart murmur    Hyperlipidemia    Hypertension    Morbid obesity (HCC)    Normal cardiac stress test    a. equivocal study, sig soft tissue artifact present, no chest discomfort or ECG changes, perfusion images suggest mod sized region of mild reversible perfusion defect. Findings may be 2/2 shifting soft tissue attenuation, but cannot r/o ischemia, EF 72%   Phlebitis and thrombophlebitis of other deep vessels of lower extremities    Phlebitis and thrombophlebitis of other deep vessels of lower extremities    Pulmonary emboli (HCC)    a. on Eliquis   Spinal stenosis, unspecified region other than cervical    Tobacco abuse    Type II or unspecified type diabetes mellitus without mention of complication, uncontrolled    Unspecified sleep apnea  Urinary tract infection, site not specified     Micro Data:  08-31-2023: SARS-CoV-2 PCR> negative 08/31/2023: Respiratory viral panel>> 08/31/23: Blood culture x 2 31-Aug-2023: Urine>> 2023-08-31: Strep pneumo urinary antigen>> 08-31-23: Legionella urine  antigen>> 08/31/23: Sputum>>  Antimicrobials:   Anti-infectives (From admission, onward)    Start     Dose/Rate Route Frequency Ordered Stop   08/17/23 2200  vancomycin (VANCOREADY) IVPB 1750 mg/350 mL  Status:  Discontinued        1,750 mg 175 mL/hr over 120 Minutes Intravenous Every 48 hours Aug 31, 2023 1229 08/31/2023 1910   08/17/23 2200  vancomycin (VANCOCIN) IVPB 1000 mg/200 mL premix        1,000 mg 200 mL/hr over 60 Minutes Intravenous Every 24 hours 2023/08/31 1910     08-31-2023 2200  ceFEPIme (MAXIPIME) 2 g in sodium chloride 0.9 % 100 mL IVPB  Status:  Discontinued        2 g 200 mL/hr over 30 Minutes Intravenous Every 12 hours Aug 31, 2023 1229 2023/08/31 1749   08/31/2023 2000  piperacillin-tazobactam (ZOSYN) IVPB 3.375 g        3.375 g 12.5 mL/hr over 240 Minutes Intravenous Every 8 hours 08-31-2023 1840     2023/08/31 1045  azithromycin (ZITHROMAX) 500 mg in sodium chloride 0.9 % 250 mL IVPB        500 mg 250 mL/hr over 60 Minutes Intravenous  Once 31-Aug-2023 1041 August 31, 2023 1448   31-Aug-2023 1000  ceFEPIme (MAXIPIME) 2 g in sodium chloride 0.9 % 100 mL IVPB        2 g 200 mL/hr over 30 Minutes Intravenous  Once Aug 31, 2023 0943 Aug 31, 2023 1026   August 31, 2023 1000  vancomycin (VANCOREADY) IVPB 2000 mg/400 mL        2,000 mg 200 mL/hr over 120 Minutes Intravenous  Once 08-31-23 0943 08/31/23 1306       Significant Hospital Events: Including procedures, antibiotic start and stop dates in addition to other pertinent events   Aug 31, 2023: Presented to ED, requiring BiPAP.  PCCM asked to admit 9/18 remains on biPAP, remains on pressors  Interim History / Subjective:  Remains critically ill Severe acidosis metabolic-started biCARB High risk for intubation Patient is alert and awake, following commands She knows her name and DOB and is aware of her SOB   Objective   Blood pressure (!) 112/44, pulse 80, temperature (!) 97.2 F (36.2 C), temperature source Axillary, resp. rate 19, height 5\' 2"  (1.575 m),  weight 133.2 kg, SpO2 100%.    FiO2 (%):  [40 %-50 %] 40 %   Intake/Output Summary (Last 24 hours) at 08/17/2023 0709 Last data filed at 08/17/2023 0700 Gross per 24 hour  Intake 1332.63 ml  Output 800 ml  Net 532.63 ml   Filed Weights   August 31, 2023 0907 08/17/23 0341  Weight: (S) 133.2 kg 133.2 kg     REVIEW OF SYSTEMS Patient alert and awake Follows commands  PHYSICAL EXAMINATION:  GENERAL:critically ill appearing, +resp distress morbidly obese EYES: Pupils equal, round, reactive to light.  No scleral icterus.  MOUTH: Moist mucosal membrane. On biPAP NECK: Supple.  PULMONARY: Lungs clear to auscultation, +wheezing CARDIOVASCULAR: S1 and S2.  Regular rate and rhythm GASTROINTESTINAL: Soft, nontender, -distended. Positive bowel sounds.  MUSCULOSKELETAL: +edema.  NEUROLOGIC: lethargic but arousable alert and awake SKIN:normal, warm to touch, Capillary refill delayed  Pulses present bilaterally    Assessment & Plan:   Acute on Chronic Hypoxic & Hypercapnic Respiratory Failure in the setting of suspected Pneumonia (HCAP) and  Pulmonary Edema PMHx: PE on Eliquis, OSA on CPAP, morbid obesity  Severe ACUTE Hypoxic and Hypercapnic Respiratory Failure -on Bipap -Wean Fio2  - Head of bed elevated 30 degrees - Intermittent chest x-ray & ABG PRN - Ensure adequate pulmonary hygiene  -Diuresis as BP and renal function permits ~ will give 20 mg IV Lasix x2 dose due to soft BP High risk for intubation  Suspect pneumonia -ABX  PMHx: E. Coli urosepsis & MRSA Pneumonia in June 2024  Acute Decompensated HFpEF Severe Aortic Stenosis Mildly Elevated Troponin, suspect demand ischemia PMHx: CAD,HTN, HLD, A.fib s/p cardioversion on 05/25/23, DVT/PE on Eliquis Echocardiogram (TEE) 05/24/23: LVEF 55 to 60%, moderate LVH,  grade II DD, RV systolic function mildly reduced, RVSP 30 mmHg, mild to moderate MR, severe AS CATH: 05/04/23: Severe single-vessel coronary artery disease with chronic  total occlusion of distal RCA with PDA and PL branches filling via left-to-right collaterals.  There is 70-80% stenosis of small-moderate first diagonal branch that is not well-suited to PCI. Mildly elevated left ventricular filling pressure (LVEDP 20 mmHg). Moderate aortic valve stenosis (mean gradient 24 mmHg). Small/stenotic right brachial veins and absent cephalic/basilic veins, not suitable for right heart catheterization. -Continuous cardiac monitoring -Maintain MAP >65 -Vasopressors as needed to maintain MAP goal -Start Midodrine -Lactic acid is normal (0.6 ~ 0.9) -Trend HS Troponin until peaked (42 ~ 42) -BNP is 980 -Consider repeat Echocardiogram  -Diuresis as BP and renal function permits ~ will give 20 mg IV Lasix x1 dose due to soft BP -Hold home Torsemide and Spironolactone -Hold home Eliquis, will start Heparin drip for now during acute illness  ACUTE KIDNEY INJURY/Renal Failure -continue Foley Catheter-assess need -Avoid nephrotoxic agents -Follow urine output, BMP -Ensure adequate renal perfusion, optimize oxygenation -Renal dose medications   Intake/Output Summary (Last 24 hours) at 08/17/2023 0719 Last data filed at 08/17/2023 0700 Gross per 24 hour  Intake 1332.63 ml  Output 800 ml  Net 532.63 ml    Diabetes Mellitus -CBG's q4h; Target range of 140 to 180 -SSI -Follow ICU Hypo/Hyperglycemia protocol  Dermatomyositis -Followed by rheumatology outpatient -Holding Azathioprine for suspected PNA & UTI   Acute Metabolic Encephalopathy improving -Provide supportive care -Promote normal sleep/wake cycle and family presence -Avoid sedating medications as able     Best Practice (right click and "Reselect all SmartList Selections" daily)   Diet/type: NPO until respiratory status improved DVT prophylaxis: prophylactic heparin  GI prophylaxis: N/A Lines: N/A Foley:  N/A Code Status:  full code Last date of multidisciplinary goals of care discussion  [N/A]  9/17: Pt updated at bedside on plan of care.  Labs   CBC: Recent Labs  Lab 08/09/2023 0901 08/28/2023 1101 08/17/23 0455  WBC 7.5 5.7 6.8  NEUTROABS 6.6  --   --   HGB 7.7* 7.7* 7.3*  HCT 25.2* 24.3* 23.6*  MCV 101.2* 100.4* 101.7*  PLT 236 165 278    Basic Metabolic Panel: Recent Labs  Lab 08/25/2023 0901 08/04/2023 1101 08/17/23 0455  NA 135  --  135  K 5.1  --  5.8*  CL 97*  --  96*  CO2 23  --  16*  GLUCOSE 88  --  206*  BUN 77*  --  95*  CREATININE 1.64* 1.75* 1.92*  CALCIUM 8.5*  --  8.3*  MG 1.9  --  1.9  PHOS 4.8*  --  5.8*   GFR: Estimated Creatinine Clearance: 36.4 mL/min (A) (by C-G formula based on SCr of 1.92 mg/dL (H)).  Recent Labs  Lab 08/14/2023 0901 08/22/2023 0902 08/22/2023 1101 08/06/2023 1157 08/17/23 0455  PROCALCITON 0.26  --   --   --  0.33  WBC 7.5  --  5.7  --  6.8  LATICACIDVEN  --  0.6  --  0.9  --     Liver Function Tests: Recent Labs  Lab 08/22/2023 0901 08/17/23 0455  AST 16  --   ALT 13  --   ALKPHOS 54  --   BILITOT 1.3*  --   PROT 6.7  --   ALBUMIN 2.9* 2.7*   No results for input(s): "LIPASE", "AMYLASE" in the last 168 hours. No results for input(s): "AMMONIA" in the last 168 hours.  ABG    Component Value Date/Time   PHART 7.18 (LL) 08/17/2023 0500   PCO2ART 38 08/17/2023 0500   PO2ART 68 (L) 08/17/2023 0500   HCO3 14.2 (L) 08/17/2023 0500   ACIDBASEDEF 13.4 (H) 08/17/2023 0500   O2SAT 94.8 08/17/2023 0500     Coagulation Profile: Recent Labs  Lab 08/05/2023 1439  INR 1.5*    Cardiac Enzymes: No results for input(s): "CKTOTAL", "CKMB", "CKMBINDEX", "TROPONINI" in the last 168 hours.  HbA1C: Hgb A1c MFr Bld  Date/Time Value Ref Range Status  04/30/2023 05:11 AM 9.1 (H) 4.8 - 5.6 % Final    Comment:    (NOTE)         Prediabetes: 5.7 - 6.4         Diabetes: >6.4         Glycemic control for adults with diabetes: <7.0   05/24/2019 09:29 AM 10.4 (H) 4.6 - 6.5 % Final    Comment:    Glycemic Control  Guidelines for People with Diabetes:Non Diabetic:  <6%Goal of Therapy: <7%Additional Action Suggested:  >8%     CBG: Recent Labs  Lab 08/03/2023 1221 08/12/2023 1435 08/02/2023 1509 08/02/2023 2007  GLUCAP 82 90 116* 145*    Past Medical History:  She,  has a past medical history of Calculus of kidney, Diabetes mellitus without complication (HCC), DVT (deep venous thrombosis) (HCC), Family history of early CAD, Gout, unspecified, Heart murmur, Hyperlipidemia, Hypertension, Morbid obesity (HCC), Normal cardiac stress test, Phlebitis and thrombophlebitis of other deep vessels of lower extremities, Phlebitis and thrombophlebitis of other deep vessels of lower extremities, Pulmonary emboli (HCC), Spinal stenosis, unspecified region other than cervical, Tobacco abuse, Type II or unspecified type diabetes mellitus without mention of complication, uncontrolled, Unspecified sleep apnea, and Urinary tract infection, site not specified.   Surgical History:   Past Surgical History:  Procedure Laterality Date   ABDOMINAL HYSTERECTOMY     hyperplasia of endometrium   ANKLE SURGERY     fracture s/p pin, right ankle   APPENDECTOMY     CARDIOVERSION N/A 05/25/2023   Procedure: CARDIOVERSION;  Surgeon: Laurey Morale, MD;  Location: [facility] ORS;  Service: Cardiovascular;  Laterality: N/A;   CHOLECYSTECTOMY     COLECTOMY     temporary colostomy, now reversed   INTESTINAL BYPASS     ovarian cyst ruptured, led to perforated intestine   LEFT HEART CATH AND CORONARY ANGIOGRAPHY N/A 05/04/2023   Procedure: LEFT HEART CATH AND CORONARY ANGIOGRAPHY;  Surgeon: Yvonne Kendall, MD;  Location: [facility] INVASIVE CV LAB;  Service: Cardiovascular;  Laterality: N/A;   STOMACH SURGERY     TEE WITHOUT CARDIOVERSION N/A 05/25/2023   Procedure: TRANSESOPHAGEAL ECHOCARDIOGRAM;  Surgeon: Laurey Morale, MD;  Location: [facility] ORS;  Service: Cardiovascular;  Laterality: N/A;  Social History:   reports that she quit smoking about  23 years ago. She started smoking about 33 years ago. She has a 10 pack-year smoking history. She has never used smokeless tobacco. She reports current alcohol use. She reports that she does not use drugs.   Family History:  Her family history includes Arthritis in her sister; Cancer in her mother; Heart attack (age of onset: 40) in her mother; Heart attack (age of onset: 103) in her father.   Allergies Allergies  Allergen Reactions   Sulfa Antibiotics Other (See Comments)    Reaction: isn't certain, thinks she ran a fever, or had a rash, maybe both.     Home Medications  Prior to Admission medications   Medication Sig Start Date End Date Taking? Authorizing Provider  acetaminophen (TYLENOL) 325 MG tablet Take 650 mg by mouth every 4 (four) hours as needed for mild pain, fever or headache.    [provider]  amiodarone (PACERONE) 200 MG tablet Take 1 tablet (200 mg total) by mouth 2 (two) times daily. 06/02/23   Sunnie Nielsen, DO  amiodarone (PACERONE) 400 MG tablet Take 1 tablet (400 mg total) by mouth 2 (two) times daily for 5 days. 05/27/23 06/01/23  Sunnie Nielsen, DO  atorvastatin (LIPITOR) 40 MG tablet Take 40 mg by mouth at bedtime.    [provider]  azathioprine (IMURAN) 100 MG tablet Take 100 mg by mouth 2 (two) times daily.    [provider]  Calcium Carb-Cholecalciferol (CALCIUM 600+D) 600-10 MG-MCG TABS Take 1 tablet by mouth daily.    [provider]  Cholecalciferol (VITAMIN D) 1000 UNITS capsule Take 2,000 Units by mouth daily.    [provider]  Dulaglutide (TRULICITY) 1.5 MG/0.5ML SOPN Inject into the skin.    [provider]  ELIQUIS 5 MG TABS tablet TAKE 1 TABLET(5 MG) BY MOUTH TWICE DAILY 03/29/18   Antonieta Iba, MD  feeding supplement (ENSURE ENLIVE / ENSURE PLUS) LIQD Take 237 mLs by mouth 3 (three) times daily between meals. 05/27/23   Sunnie Nielsen, DO  fluticasone [facility]) 50 MCG/ACT nasal spray  Place 2 sprays into both nostrils daily as needed for allergies or rhinitis. 02/21/19   McLean-Scocuzza, Pasty Spillers, MD  fluticasone furoate-vilanterol (BREO ELLIPTA) 100-25 MCG/ACT AEPB Inhale 1 puff into the lungs daily. 05/28/23   Sunnie Nielsen, DO  insulin aspart (NOVOLOG) 100 UNIT/ML injection Inject 0-20 Units into the skin 4 (four) times daily -  before meals and at bedtime. Patient not taking: Reported on 06/20/2023 05/27/23   Sunnie Nielsen, DO  insulin aspart (NOVOLOG) 100 UNIT/ML injection Inject 4-24 Units into the skin 3 (three) times daily with meals. Based on sliding scale - CBG 70 - 120: 4 units CBG 121 - 150: 7 units CBG 151 - 200: 8 units CBG 201 - 250: 11 units CBG 251 - 300: 15 units CBG 301 - 350: 19 units CBG 351 - 400: 24 units Patient not taking: Reported on 06/20/2023 05/27/23   Sunnie Nielsen, DO  Insulin Glargine [facility]) 100 UNIT/ML Inject 40 Units into the skin at bedtime.    [provider]  insulin lispro (HUMALOG) 100 UNIT/ML injection Inject 100 Units into the skin 3 (three) times daily before meals.    [provider]  ipratropium-albuterol (DUONEB) 0.5-2.5 (3) MG/3ML SOLN Take 3 mLs by nebulization every 4 (four) hours as needed. 05/27/23   Sunnie Nielsen, DO  lidocaine (XYLOCAINE) 2 % solution Use as directed 15  mLs in the mouth or throat every 3 (three) hours as needed for mouth pain. Patient not taking: Reported on 06/20/2023 05/27/23   Sunnie Nielsen, DO  loratadine (CLARITIN) 10 MG tablet Take 1 tablet (10 mg total) by mouth daily as needed for allergies. 02/21/19   McLean-Scocuzza, Pasty Spillers, MD  melatonin 5 MG TABS Take 1 tablet (5 mg total) by mouth at bedtime. 05/27/23   Sunnie Nielsen, DO  menthol-cetylpyridinium (CEPACOL) 3 MG lozenge Take 1 lozenge (3 mg total) by mouth as needed for sore throat. Patient not taking: Reported on 06/20/2023 05/27/23   Sunnie Nielsen, DO  metoprolol succinate (TOPROL-XL) 25 MG 24 hr  tablet Take 1 tablet (25 mg total) by mouth daily. 05/28/23   Sunnie Nielsen, DO  Multiple Vitamin (MULTIVITAMIN WITH MINERALS) TABS tablet Take 1 tablet by mouth daily. 05/27/23   Sunnie Nielsen, DO  pantoprazole (PROTONIX) 40 MG tablet Take 1 tablet (40 mg total) by mouth daily. 05/28/23   Sunnie Nielsen, DO  polyethylene glycol (MIRALAX / GLYCOLAX) 17 g packet Take 17 g by mouth daily as needed for mild constipation. 05/27/23   Sunnie Nielsen, DO  pregabalin (LYRICA) 200 MG capsule Take 200 mg by mouth 3 (three) times daily.    [provider]  Semaglutide, 2 MG/DOSE, (OZEMPIC, 2 MG/DOSE,) 8 MG/3ML SOPN Inject 0.25 mg once weekly for 4 week and increase to 0.5 mg weekly and follow up with PCP. Patient not taking: Reported on 06/20/2023 05/05/23   Agbata, Elwyn Lade, MD  senna-docusate (SENOKOT-S) 8.6-50 MG tablet Take 1 tablet by mouth at bedtime as needed for moderate constipation. 05/27/23   Sunnie Nielsen, DO  spironolactone (ALDACTONE) 25 MG tablet Take 25 mg by mouth daily.    [provider]  torsemide (DEMADEX) 20 MG tablet Take 1 tablet (20 mg total) by mouth daily. 05/28/23   Sunnie Nielsen, DO       DVT/GI PRX  assessed I Assessed the need for Labs I Assessed the need for Foley I Assessed the need for Central Venous Line Family Discussion when available I Assessed the need for Mobilization I made an Assessment of medications to be adjusted accordingly Safety Risk assessment completed  CASE DISCUSSED IN MULTIDISCIPLINARY ROUNDS WITH ICU TEAM   Critical Care Time devoted to patient care services described in this note  is 55 minutes.  Critical care was necessary to treat /prevent imminent and life-threatening deterioration. Overall, patient is critically ill, prognosis is guarded.  Patient with Multiorgan failure and at high risk for cardiac arrest and death.    Lucie Leather, M.D.  [facility]  Medical  Director [facility] Medical Director [facility] Cardio-Pulmonary Department

## 2023-08-17 NOTE — Progress Notes (Signed)
   08/17/23 1500  Spiritual Encounters  Type of Visit Attempt (pt unavailable)  Spiritual Framework  Patient Stress Factors Not reviewed  Family Stress Factors Not reviewed  Intervention Outcomes  Outcomes Other (comment) (Will follow up later with patient and family)  Spiritual Care Plan  Spiritual Care Issues Still Outstanding Chaplain will continue to follow   Will check up on patient later on today patient was asleep during my rounding.

## 2023-08-17 NOTE — Plan of Care (Signed)
  Problem: Education: Goal: Understanding of CV disease, CV risk reduction, and recovery process will improve Outcome: Progressing   Problem: Activity: Goal: Ability to return to baseline activity level will improve Outcome: Progressing   Problem: Education: Goal: Knowledge of General Education information will improve Description: Including pain rating scale, medication(s)/side effects and non-pharmacologic comfort measures Outcome: Progressing   Problem: Activity: Goal: Risk for activity intolerance will decrease Outcome: Progressing   Problem: Coping: Goal: Level of anxiety will decrease Outcome: Progressing

## 2023-08-18 LAB — LEGIONELLA PNEUMOPHILA SEROGP 1 UR AG: L. pneumophila Serogp 1 Ur Ag: NEGATIVE

## 2023-08-21 LAB — CULTURE, BLOOD (ROUTINE X 2)
Culture: NO GROWTH
Culture: NO GROWTH

## 2023-08-30 NOTE — Death Summary Note (Signed)
DEATH SUMMARY   Patient Details  Name: Sonya Sanchez MRN: [id number] DOB: 02-08-1956  Admission/Discharge Information   Admit Date:  08/19/23  Date of Death: Date of Death: 08/21/23  Time of Death: Time of Death: 10/18/42  Length of Stay: 2  Referring Physician: Pcp, No   Reason(s) for Hospitalization  Acute on chronic hypoxic and hypercapnic respiratory failure secondary to acute decompensated HFpEF and suspected HCAP requiring BiPAP support  Diagnoses  Preliminary cause of death:  Secondary Diagnoses (including complications and co-morbidities):  Principal Problem:   Respiratory failure [facility])   Brief Hospital Course (including significant findings, care, treatment, and services provided and events leading to death)  Sonya Sanchez is a 67 y.o. year old female who  presented to [facility] ED on 08-19-23 from [facility] due to complaints of progressive shortness of breath over the past few days.   Upon presentation to the ED she was stating that she "can't breathe".  She denies any dizziness, chest pain, palpitations, cough, wheezing, fever, chills, abdominal pain, nausea, vomiting, diarrhea.  She reportedly has been taking ciprofloxacin for recent diagnosis of UTI about 2 days ago, currently denies any dysuria or other urinary symptoms.  Reports she has been compliant with her CPAP at home.   Of note she was admitted at [facility] from 05/13/23 through 05/27/23 for treatment of Acute on Chronic Hypoxic and Hypercapnic Respiratory Failure in the setting of Acute Decompensated HFpEF and Severe Sepsis from MRSA Pneumonia and E.Coli Urosepsis.   ED Course: Initial Vital Signs: Temperature 97.2 F axillary, respiratory rate 19, pulse 86, blood pressure 103/47, SpO2 86% on 4L  Significant Labs: BUN 77, creatinine 1.64, BNP 980, high-sensitivity troponin 42, lactic acid 0.6, procalcitonin 0.26, WBC 7.5, hemoglobin 7.7 VBG: pH 7.16/pCO2 75/pO2 61/bicarb 26.7 COVID-19 PCR  is negative Imaging Chest X-ray>>IMPRESSION: 1. New left retrocardiac airspace opacity suggesting combination of left lower lobe atelectasis and/or consolidation with pleural effusion. 2. Increasing triangular opacity overlying the right mid lower lung zone, which may represent combination of atelectasis and/or pneumonia. Probable trace right pleural effusion. Medications Administered: IV Azithromycin, Cefepime, and Vancomycin, Duonebs x3, 125 mg Solumedrol   PCCM asked to admit for further workup and treatment.  Please see "significant hospital events" section below for full detailed hospital course. August 19, 2023: Presented to ED, requiring BiPAP.  PCCM asked to admit 9/18 remains on biPAP, remains on pressors.  Severe metabolic acidosis started on bicarbonate drip. Patient alert and awake, following commands.  Patient discussed her wishes with her family members bedside and PCCM.  The patient made it clear she would not want dialysis and would like to transition to comfort measures. Aug 21, 2023: Patient passed away at 00:43  Pertinent Labs and Studies  Significant Diagnostic Studies DG Chest [facility] 1 View  Result Date: 08/17/2023 CLINICAL DATA:  Acute respiratory failure with hypoxia EXAM: PORTABLE CHEST - 1 VIEW COMPARISON:  2023/08/19 FINDINGS: Moderate right and smaller left pleural effusions. Some increase in right mid lung atelectasis or infiltrate. Perihilar vascular congestion. Heart size and mediastinal contours are within normal limits. Visualized bones unremarkable. IMPRESSION: Bilateral pleural effusions, right greater than left. Electronically Signed   By: Corlis Leak M.D.   On: 08/17/2023 10:12   DG Chest Portable 1 View  Result Date: August 19, 2023 CLINICAL DATA:  SOB.  Difficulty breathing. EXAM: PORTABLE CHEST 1 VIEW COMPARISON:  05/19/2023. FINDINGS: Mild pulmonary vascular congestion, which may be accentuated by low lung volume. There is a new left retrocardiac airspace opacity obscuring the  left hemidiaphragm, descending thoracic aorta and blunting the left lateral costophrenic angle suggesting combination of left lower lobe atelectasis and/or consolidation with pleural effusion. There is also increasing triangular opacity overlying the right mid lower lung zone, which may represent combination of atelectasis and/or pneumonia. There is probable trace right pleural effusion also. Stable cardio-mediastinal silhouette. No acute osseous abnormalities. The soft tissues are within normal limits. IMPRESSION: 1. New left retrocardiac airspace opacity suggesting combination of left lower lobe atelectasis and/or consolidation with pleural effusion. 2. Increasing triangular opacity overlying the right mid lower lung zone, which may represent combination of atelectasis and/or pneumonia. Probable trace right pleural effusion. Electronically Signed   By: Jules Schick M.D.   On: 08/20/2023 10:21    Microbiology Recent Results (from the past 240 hour(s))  Blood culture (routine x 2)     Status: None (Preliminary result)   Collection Time: 08/19/2023  9:02 AM   Specimen: BLOOD  Result Value Ref Range Status   Specimen Description BLOOD BLOOD LEFT FOREARM  Final   Special Requests   Final    BOTTLES DRAWN AEROBIC AND ANAEROBIC Blood Culture results may not be optimal due to an excessive volume of blood received in culture bottles   Culture   Final    NO GROWTH < 24 HOURS Performed at [facility], [address], [place], [place] [address]    Report Status PENDING  Incomplete  SARS Coronavirus 2 by RT PCR (hospital order, performed in [facility] hospital lab) *cepheid single result test* Anterior Nasal Swab     Status: None   Collection Time: 08/13/2023  9:02 AM   Specimen: Anterior Nasal Swab  Result Value Ref Range Status   SARS Coronavirus 2 by RT PCR NEGATIVE NEGATIVE Final    Comment: (NOTE) SARS-CoV-2 target nucleic acids are NOT DETECTED.  The SARS-CoV-2 RNA is generally  detectable in upper and lower respiratory specimens during the acute phase of infection. The lowest concentration of SARS-CoV-2 viral copies this assay can detect is 250 copies / mL. A negative result does not preclude SARS-CoV-2 infection and should not be used as the sole basis for treatment or other patient management decisions.  A negative result may occur with improper specimen collection / handling, submission of specimen other than nasopharyngeal swab, presence of viral mutation(s) within the areas targeted by this assay, and inadequate number of viral copies (<250 copies / mL). A negative result must be combined with clinical observations, patient history, and epidemiological information.  Fact Sheet for Patients:   RoadLapTop.co.za  Fact Sheet for Healthcare Providers: http://kim-miller.com/  This test is not yet approved or  cleared by the [place] FDA and has been authorized for detection and/or diagnosis of SARS-CoV-2 by FDA under an Emergency Use Authorization (EUA).  This EUA will remain in effect (meaning this test can be used) for the duration of the COVID-19 declaration under Section 564(b)(1) of the Act, 21 U.S.C. section 360bbb-3(b)(1), unless the authorization is terminated or revoked sooner.  Performed at [facility], [address] [place], [place] [address]   Blood culture (routine x 2)     Status: None (Preliminary result)   Collection Time: 08/21/2023  9:07 AM   Specimen: BLOOD  Result Value Ref Range Status   Specimen Description BLOOD BLOOD RIGHT ARM  Final   Special Requests   Final    BOTTLES DRAWN AEROBIC AND ANAEROBIC Blood Culture results may not be optimal due to an excessive volume of blood  received in culture bottles   Culture   Final    NO GROWTH < 24 HOURS Performed at [facility], [address] [place], [place] [address]    Report Status PENDING  Incomplete  MRSA Next  Gen by PCR, Nasal     Status: None   Collection Time: 2023-09-13 12:30 PM   Specimen: Nasal Mucosa; Nasal Swab  Result Value Ref Range Status   MRSA by PCR Next Gen NOT DETECTED NOT DETECTED Final    Comment: (NOTE) The GeneXpert MRSA Assay (FDA approved for NASAL specimens only), is one component of a comprehensive MRSA colonization surveillance program. It is not intended to diagnose MRSA infection nor to guide or monitor treatment for MRSA infections. Test performance is not FDA approved in patients less than 48 years old. Performed at [facility], [address] [place], [place] [address]   Respiratory (~20 pathogens) panel by PCR     Status: None   Collection Time: 08/17/23  4:48 PM   Specimen: Nasopharyngeal Swab; Respiratory  Result Value Ref Range Status   Adenovirus NOT DETECTED NOT DETECTED Final   Coronavirus 229E NOT DETECTED NOT DETECTED Final    Comment: (NOTE) The Coronavirus on the Respiratory Panel, DOES NOT test for the novel  Coronavirus (2019 nCoV)    Coronavirus HKU1 NOT DETECTED NOT DETECTED Final   Coronavirus NL63 NOT DETECTED NOT DETECTED Final   Coronavirus OC43 NOT DETECTED NOT DETECTED Final   Metapneumovirus NOT DETECTED NOT DETECTED Final   Rhinovirus / Enterovirus NOT DETECTED NOT DETECTED Final   Influenza A NOT DETECTED NOT DETECTED Final   Influenza B NOT DETECTED NOT DETECTED Final   Parainfluenza Virus 1 NOT DETECTED NOT DETECTED Final   Parainfluenza Virus 2 NOT DETECTED NOT DETECTED Final   Parainfluenza Virus 3 NOT DETECTED NOT DETECTED Final   Parainfluenza Virus 4 NOT DETECTED NOT DETECTED Final   Respiratory Syncytial Virus NOT DETECTED NOT DETECTED Final   Bordetella pertussis NOT DETECTED NOT DETECTED Final   Bordetella Parapertussis NOT DETECTED NOT DETECTED Final   Chlamydophila pneumoniae NOT DETECTED NOT DETECTED Final   Mycoplasma pneumoniae NOT DETECTED NOT DETECTED Final    Comment: Performed at [facility]  Lab, [address] [place], [place] [address]    Lab Basic Metabolic Panel: Recent Labs  Lab 09/13/2023 0901 September 13, 2023 1101 08/17/23 0455 08/17/23 1312  NA 135  --  135 136  K 5.1  --  5.8* 5.2*  CL 97*  --  96* 95*  CO2 23  --  16* 19*  GLUCOSE 88  --  206* 284*  BUN 77*  --  95* 93*  CREATININE 1.64* 1.75* 1.92* 2.23*  CALCIUM 8.5*  --  8.3* 7.8*  MG 1.9  --  1.9  --   PHOS 4.8*  --  5.8*  --    Liver Function Tests: Recent Labs  Lab September 13, 2023 0901 08/17/23 0455  AST 16  --   ALT 13  --   ALKPHOS 54  --   BILITOT 1.3*  --   PROT 6.7  --   ALBUMIN 2.9* 2.7*   No results for input(s): "LIPASE", "AMYLASE" in the last 168 hours. No results for input(s): "AMMONIA" in the last 168 hours. CBC: Recent Labs  Lab 2023-09-13 0901 September 13, 2023 1101 08/17/23 0455  WBC 7.5 5.7 6.8  NEUTROABS 6.6  --   --   HGB 7.7* 7.7* 7.3*  HCT 25.2* 24.3* 23.6*  MCV 101.2*  100.4* 101.7*  PLT 236 165 278   Cardiac Enzymes: No results for input(s): "CKTOTAL", "CKMB", "CKMBINDEX", "TROPONINI" in the last 168 hours. Sepsis Labs: Recent Labs  Lab 07-Sep-2023 0901 09-07-2023 0902 09/07/23 1101 Sep 07, 2023 1157 08/17/23 0455  PROCALCITON 0.26  --   --   --  0.33  WBC 7.5  --  5.7  --  6.8  LATICACIDVEN  --  0.6  --  0.9  --     Procedures/Operations   none  Maneh Sieben L Rust-Chester 08/27/2023, 12:59 AM  Cheryll Cockayne Rust-Chester, AGACNP-BC Acute Care Nurse Practitioner [facility]   [phone] / [phone] Please see Amion for details.

## 2023-08-30 NOTE — Progress Notes (Signed)
Patient pronounced at 0043. Britton-Lee, NP notified as well as CCMD. Unable to leave voicemail for patient sister, Daira Haun,  due to mailbox being full. SMS sent with contact info for unit. Patient care relinquished.

## 2023-08-30 DEATH — deceased
